# Patient Record
Sex: Female | Born: 1950 | Race: White | Hispanic: No | Marital: Married | Smoking: Never smoker
Health system: Southern US, Community
[De-identification: ages and names within clinical notes are randomized; demographics above are authoritative.]

## PROBLEM LIST (undated history)

## (undated) DIAGNOSIS — M1990 Unspecified osteoarthritis, unspecified site: Secondary | ICD-10-CM

## (undated) DIAGNOSIS — K649 Unspecified hemorrhoids: Secondary | ICD-10-CM

## (undated) DIAGNOSIS — A048 Other specified bacterial intestinal infections: Secondary | ICD-10-CM

## (undated) DIAGNOSIS — K219 Gastro-esophageal reflux disease without esophagitis: Secondary | ICD-10-CM

## (undated) DIAGNOSIS — F32A Depression, unspecified: Secondary | ICD-10-CM

## (undated) DIAGNOSIS — N289 Disorder of kidney and ureter, unspecified: Secondary | ICD-10-CM

## (undated) DIAGNOSIS — I10 Essential (primary) hypertension: Secondary | ICD-10-CM

## (undated) DIAGNOSIS — E119 Type 2 diabetes mellitus without complications: Secondary | ICD-10-CM

## (undated) DIAGNOSIS — K591 Functional diarrhea: Secondary | ICD-10-CM

## (undated) DIAGNOSIS — R7401 Elevation of levels of liver transaminase levels: Secondary | ICD-10-CM

## (undated) DIAGNOSIS — R740 Nonspecific elevation of levels of transaminase and lactic acid dehydrogenase [LDH]: Secondary | ICD-10-CM

## (undated) DIAGNOSIS — D649 Anemia, unspecified: Secondary | ICD-10-CM

## (undated) DIAGNOSIS — F419 Anxiety disorder, unspecified: Secondary | ICD-10-CM

## (undated) DIAGNOSIS — F329 Major depressive disorder, single episode, unspecified: Secondary | ICD-10-CM

## (undated) DIAGNOSIS — G4730 Sleep apnea, unspecified: Secondary | ICD-10-CM

## (undated) DIAGNOSIS — E7800 Pure hypercholesterolemia, unspecified: Secondary | ICD-10-CM

## (undated) DIAGNOSIS — Z860101 Personal history of adenomatous and serrated colon polyps: Secondary | ICD-10-CM

## (undated) HISTORY — DX: Depression, unspecified: F32.A

## (undated) HISTORY — PX: CHOLECYSTECTOMY: SHX55

## (undated) HISTORY — DX: Major depressive disorder, single episode, unspecified: F32.9

## (undated) HISTORY — PX: OTHER SURGICAL HISTORY: SHX169

---

## 1999-06-13 HISTORY — PX: DILATION AND CURETTAGE OF UTERUS: SHX78

## 2004-11-23 ENCOUNTER — Ambulatory Visit: Payer: Self-pay | Admitting: Unknown Physician Specialty

## 2005-12-14 DIAGNOSIS — E1169 Type 2 diabetes mellitus with other specified complication: Secondary | ICD-10-CM | POA: Insufficient documentation

## 2005-12-14 DIAGNOSIS — E119 Type 2 diabetes mellitus without complications: Secondary | ICD-10-CM | POA: Insufficient documentation

## 2005-12-14 DIAGNOSIS — N951 Menopausal and female climacteric states: Secondary | ICD-10-CM | POA: Insufficient documentation

## 2005-12-14 DIAGNOSIS — E782 Mixed hyperlipidemia: Secondary | ICD-10-CM | POA: Insufficient documentation

## 2005-12-14 DIAGNOSIS — N312 Flaccid neuropathic bladder, not elsewhere classified: Secondary | ICD-10-CM | POA: Insufficient documentation

## 2005-12-19 ENCOUNTER — Ambulatory Visit: Payer: Self-pay

## 2006-12-24 DIAGNOSIS — G479 Sleep disorder, unspecified: Secondary | ICD-10-CM | POA: Insufficient documentation

## 2008-06-09 DIAGNOSIS — F0630 Mood disorder due to known physiological condition, unspecified: Secondary | ICD-10-CM | POA: Insufficient documentation

## 2008-06-09 DIAGNOSIS — I8390 Asymptomatic varicose veins of unspecified lower extremity: Secondary | ICD-10-CM | POA: Insufficient documentation

## 2008-06-10 ENCOUNTER — Ambulatory Visit: Payer: Self-pay

## 2008-12-09 DIAGNOSIS — G4730 Sleep apnea, unspecified: Secondary | ICD-10-CM | POA: Insufficient documentation

## 2009-02-26 DIAGNOSIS — F411 Generalized anxiety disorder: Secondary | ICD-10-CM | POA: Insufficient documentation

## 2009-02-26 DIAGNOSIS — G4700 Insomnia, unspecified: Secondary | ICD-10-CM | POA: Insufficient documentation

## 2009-04-21 ENCOUNTER — Ambulatory Visit: Payer: Self-pay | Admitting: Unknown Physician Specialty

## 2009-04-23 ENCOUNTER — Ambulatory Visit: Payer: Self-pay | Admitting: Unknown Physician Specialty

## 2012-01-19 ENCOUNTER — Ambulatory Visit: Payer: Self-pay | Admitting: Family Medicine

## 2012-01-23 ENCOUNTER — Ambulatory Visit: Payer: Self-pay | Admitting: Family Medicine

## 2012-02-26 ENCOUNTER — Ambulatory Visit: Payer: Self-pay | Admitting: Anesthesiology

## 2012-03-01 ENCOUNTER — Emergency Department: Payer: Self-pay | Admitting: Unknown Physician Specialty

## 2012-03-01 ENCOUNTER — Ambulatory Visit: Payer: Self-pay | Admitting: General Surgery

## 2012-03-01 LAB — CBC
HCT: 35.3 % (ref 35.0–47.0)
HGB: 12 g/dL (ref 12.0–16.0)
MCH: 31.4 pg (ref 26.0–34.0)
MCHC: 34 g/dL (ref 32.0–36.0)
MCV: 92 fL (ref 80–100)
Platelet: 131 10*3/uL — ABNORMAL LOW (ref 150–440)
RBC: 3.83 10*6/uL (ref 3.80–5.20)
RDW: 14.9 % — ABNORMAL HIGH (ref 11.5–14.5)
WBC: 14.5 10*3/uL — ABNORMAL HIGH (ref 3.6–11.0)

## 2012-03-01 LAB — COMPREHENSIVE METABOLIC PANEL
Albumin: 3.2 g/dL — ABNORMAL LOW (ref 3.4–5.0)
Alkaline Phosphatase: 75 U/L (ref 50–136)
Anion Gap: 14 (ref 7–16)
BUN: 23 mg/dL — ABNORMAL HIGH (ref 7–18)
Bilirubin,Total: 0.5 mg/dL (ref 0.2–1.0)
Calcium, Total: 8.3 mg/dL — ABNORMAL LOW (ref 8.5–10.1)
Chloride: 103 mmol/L (ref 98–107)
Co2: 18 mmol/L — ABNORMAL LOW (ref 21–32)
Creatinine: 1.72 mg/dL — ABNORMAL HIGH (ref 0.60–1.30)
EGFR (African American): 36 — ABNORMAL LOW
EGFR (Non-African Amer.): 31 — ABNORMAL LOW
Glucose: 494 mg/dL — ABNORMAL HIGH (ref 65–99)
Osmolality: 296 (ref 275–301)
Potassium: 4.4 mmol/L (ref 3.5–5.1)
SGOT(AST): 47 U/L — ABNORMAL HIGH (ref 15–37)
SGPT (ALT): 56 U/L (ref 12–78)
Sodium: 135 mmol/L — ABNORMAL LOW (ref 136–145)
Total Protein: 6.7 g/dL (ref 6.4–8.2)

## 2012-03-02 LAB — MAGNESIUM: Magnesium: 1.4 mg/dL — ABNORMAL LOW

## 2012-03-02 LAB — TROPONIN I: Troponin-I: <0.02 ng/mL

## 2012-03-04 LAB — PATHOLOGY REPORT

## 2013-12-22 ENCOUNTER — Ambulatory Visit: Payer: Self-pay | Admitting: Family Medicine

## 2014-02-04 ENCOUNTER — Ambulatory Visit: Payer: Self-pay | Admitting: Obstetrics and Gynecology

## 2014-09-28 ENCOUNTER — Ambulatory Visit
Admit: 2014-09-28 | Disposition: A | Discharge status: Home / Self Care | Payer: Self-pay | Attending: Family Medicine | Admitting: Family Medicine

## 2014-09-29 NOTE — Op Note (Signed)
PATIENT NAME:  Cynthia Sims, Cynthia Sims MR#:  [id number] DATE OF BIRTH:  Aug 10, 1950  DATE OF PROCEDURE:  03/01/2012  PREOPERATIVE DIAGNOSIS: Chronic cholecystitis and cholelithiasis.   POSTOPERATIVE DIAGNOSIS: Chronic cholecystitis and cholelithiasis.   OPERATIVE PROCEDURE: Laparoscopic cholecystectomy with intraoperative cholangiograms.   SURGEON: Hervey Ard, MD  ANESTHESIA: General endotracheal.  ESTIMATED BLOOD LOSS: Minimal.   CLINICAL NOTE: This 64 year old woman has had recurrent episodes of upper abdominal pain and ultrasound showed evidence of cholelithiasis. She was felt to be a candidate for elective cholecystectomy. She received Unasyn intravenously prior to the procedure due to a past history of urinary tract infections.   OPERATIVE NOTE: With the patient under adequate general endotracheal anesthesia, the abdomen was prepped with ChloraPrep and draped. A Veress needle was placed through a transumbilical incision. After assuring intraabdominal location with the hanging drop test, the abdomen was insufflated with CO2 at 10 mmHg pressure. A 10 mm step port was expanded. Inspection showed no evidence of injury from initial port placement. An 11 mm Xcel port was placed in the epigastrium. There was noted to be a band of adhesions between the omentum and the anterior abdominal wall below the level of the liver. These were taken down with cautery dissection. Two 5-mm step ports were placed laterally. The gallbladder was placed on cephalad traction. Filmy adhesions were noted between the duodenum and the neck of the gallbladder. These were gently taken down with traction. Inspection of the area showed no evidence of injury to the duodenum. The neck of the gallbladder was cleared and the cystic duct identified. A Kumar clamp was placed. Initial cholangiogram showed only reflux into the gallbladder. The clamp was repositioned and free flow into the cystic duct, common bile duct and duodenum with  slight flush of reflux into the common and hepatic duct was noted. The cystic duct and branches of the cystic artery were divided after application of clips x2. The gallbladder was removed from the liver bed making use of hook cautery dissection. It was placed into an Endo Catch bag and then delivered through the umbilical port site. Inspection from the epigastric site showed no evidence of injury from initial port placement. The right upper quadrant was irrigated. Good hemostasis was noted. Bile spillage that had occurred during the cholangiograms was cleared to minimize postop changes. After desufflating the abdomen, the fascia at the umbilicus was approximated with 0 Vicryl figure-of-eight suture. Skin incisions were closed with 4-0 Vicryl subcuticular sutures. Benzoin, Steri-Strips, Telfa, and Tegaderm dressing was then applied.   The patient tolerated the procedure well and was taken to the recovery room in stable condition.  ____________________________ Robert Bellow, MD jwb:slb D: 03/01/2012 20:27:35 ET T: 03/02/2012 11:02:28 ET JOB#: [id number]  cc: Robert Bellow, MD, <Dictator> Khalen Styer Amedeo Kinsman MD ELECTRONICALLY SIGNED 03/04/2012 8:05

## 2014-12-21 ENCOUNTER — Other Ambulatory Visit: Payer: Self-pay | Admitting: Family Medicine

## 2015-01-13 DIAGNOSIS — N183 Chronic kidney disease, stage 3 unspecified: Secondary | ICD-10-CM | POA: Insufficient documentation

## 2015-01-14 ENCOUNTER — Ambulatory Visit (INDEPENDENT_AMBULATORY_CARE_PROVIDER_SITE_OTHER): Payer: PPO | Admitting: Family Medicine

## 2015-01-14 ENCOUNTER — Encounter: Payer: Self-pay | Admitting: Family Medicine

## 2015-01-14 VITALS — BP 110/68 | HR 67 | Temp 98.1°F | Resp 16 | Wt 190.8 lb

## 2015-01-14 DIAGNOSIS — E1122 Type 2 diabetes mellitus with diabetic chronic kidney disease: Secondary | ICD-10-CM

## 2015-01-14 DIAGNOSIS — N189 Chronic kidney disease, unspecified: Secondary | ICD-10-CM | POA: Diagnosis not present

## 2015-01-14 DIAGNOSIS — I10 Essential (primary) hypertension: Secondary | ICD-10-CM | POA: Diagnosis not present

## 2015-01-14 DIAGNOSIS — N312 Flaccid neuropathic bladder, not elsewhere classified: Secondary | ICD-10-CM

## 2015-01-14 DIAGNOSIS — F419 Anxiety disorder, unspecified: Secondary | ICD-10-CM | POA: Insufficient documentation

## 2015-01-14 LAB — POCT UA - MICROALBUMIN: Microalbumin Ur, POC: 2 mg/L

## 2015-01-14 MED ORDER — CITALOPRAM HYDROBROMIDE 20 MG PO TABS: 20.0000 mg | ORAL_TABLET | Freq: Every day | ORAL | Status: DC

## 2015-01-14 MED ORDER — LOVASTATIN 40 MG PO TABS: 40.0000 mg | ORAL_TABLET | Freq: Every day | ORAL | Status: DC

## 2015-01-14 MED ORDER — LISINOPRIL-HYDROCHLOROTHIAZIDE 10-12.5 MG PO TABS: 1.0000 | ORAL_TABLET | Freq: Every day | ORAL | Status: DC

## 2015-01-14 MED ORDER — GLIPIZIDE 10 MG PO TABS: 10.0000 mg | ORAL_TABLET | Freq: Two times a day (BID) | ORAL | Status: DC

## 2015-01-14 NOTE — Progress Notes (Signed)
[name] Female    DOB: [birth year]   64 y.o.   MRN: [id number] Visit Date: 01/14/2015  Today's Provider: [name], PA   Chief Complaint  Patient presents with  . Diabetes    follow up   Subjective:    Diabetes She presents for her follow-up diabetic visit. She has type 2 diabetes mellitus. No MedicAlert identification noted. Her disease course has been stable. Pertinent negatives for hypoglycemia include no confusion, dizziness, headaches, mood changes or nervousness/anxiousness. Associated symptoms include weakness (sometimes). Pertinent negatives for diabetes include no blurred vision, no chest pain, no fatigue, no foot ulcerations, no visual change and no weight loss. Symptoms are stable.  Reports she was forced into retirement May 2016 and switched  Insurance to [employer]. Now Januvia no longer covered by insurance and only taking Glipizide 10 mg qd.  History reviewed. No pertinent past medical history. Patient Active Problem List   Diagnosis Date Noted  . Chronic kidney disease (CKD), stage III (moderate) 01/13/2015  . Cannot sleep 02/26/2009  . Anxiety state 02/26/2009  . 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 Diabetes 12/14/2005  . 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 Menopausal symptom 12/14/2005   Past Surgical History  Procedure Laterality Date  . Cesarean section  1983  . Dilation and curettage of uterus  2001   Family History  Problem Relation Age of Onset  . Alzheimer's disease Mother   . COPD Mother   . Breast cancer Mother   . Osteoporosis Mother   . Heart attack Father   . Lupus Father   . Diabetes Father   . Hypertension Father   . Diabetes Sister   . COPD Maternal Grandmother   . Diabetes Paternal Grandfather   . Diabetes Sister   . Diabetes Sister      No Known Allergies     Previous Medications   CITALOPRAM (CELEXA) 20 MG TABLET    Take 1 tablet by mouth at bedtime.   CONJUGATED ESTROGENS (PREMARIN) VAGINAL CREAM    Place vaginally as needed.   CRANBERRY 125 MG TABS    Take 1 tablet by mouth daily.   FERROUS SULFATE 325 (65 FE) MG TABLET    Take 1 tablet by mouth daily.   GLIPIZIDE (GLUCOTROL) 10 MG TABLET    TAKE ONE TABLET BY MOUTH TWICE DAILY   LISINOPRIL-HYDROCHLOROTHIAZIDE (PRINZIDE,ZESTORETIC) 10-12.5 MG PER TABLET    Take 1 tablet by mouth daily.   LOVASTATIN (MEVACOR) 40 MG TABLET    Take 1 tablet by mouth daily.   SITAGLIPTIN (JANUVIA) 50 MG TABLET    Take 1 tablet by mouth daily.    Review of Systems  Constitutional: Negative for fever, weight loss, appetite change, fatigue and unexpected weight change.  HENT: Negative.  Negative for ear pain, rhinorrhea and sinus pressure.   Eyes: Negative.  Negative for blurred vision and visual disturbance.  Respiratory: Negative.   Cardiovascular: Negative.  Negative for chest pain, palpitations and leg swelling.  Endocrine: Negative.   Genitourinary: Negative.   Musculoskeletal: Positive for back pain (sometimes but is due to arthritis).  Skin: Negative.   Allergic/Immunologic: Negative.   Neurological: Positive for weakness (sometimes). Negative for dizziness and headaches.  Hematological: Negative.  Does not bruise/bleed easily.  Psychiatric/Behavioral: Negative.  Negative for confusion. The patient is not nervous/anxious.  History  Substance Use Topics  . Smoking status: Never Smoker   . Smokeless tobacco: Never Used  . Alcohol Use: No   Objective:   BP 110/68 mmHg  Pulse 67  Temp(Src) 98.1 F (36.7 C) (Oral)  Resp 16  Wt 190 lb 12.8 oz (86.546 kg)  Physical Exam  Constitutional: She is oriented to person, place, and time. She appears well-developed and well-nourished. No distress.  HENT:  Head: Normocephalic and atraumatic.  Right Ear: Hearing normal.  Left Ear: Hearing normal.   Nose: Nose normal.  Eyes: Conjunctivae, EOM and lids are normal. Right eye exhibits no discharge. Left eye exhibits no discharge. No scleral icterus.  Neck: Normal range of motion. Neck supple.  Cardiovascular: Normal rate, regular rhythm and normal heart sounds.   Pulmonary/Chest: Effort normal and breath sounds normal. No respiratory distress.  Abdominal: Soft. Bowel sounds are normal.  Musculoskeletal: Normal range of motion.  Neurological: She is alert and oriented to person, place, and time.  No significant areas of loss of sensation in feet to test with nylon string.  Skin: Skin is intact. No lesion and no rash noted.  Psychiatric: She has a normal mood and affect. Her speech is normal and behavior is normal. Thought content normal.      Assessment & Plan:     1. Type 2 diabetes mellitus with diabetic chronic kidney disease Only taking glipizide daily now and feels she is stable in her sugar control. [facility] eye exam was 09-15-14. Having some tingling and pains in her toes and soles of her feet bilaterally - neuropathy. Will refill medications for diabetes and hyperlipidemia. Follow diabetic diet and check FBS daily. - glipiZIDE (GLUCOTROL) 10 MG tablet; Take 1 tablet (10 mg total) by mouth 2 (two) times daily.  Dispense: 60 tablet; Refill: 6 - lovastatin (MEVACOR) 40 MG tablet; Take 1 tablet (40 mg total) by mouth daily.  Dispense: 30 tablet; Refill: 6 - Hemoglobin A1c - Comprehensive metabolic panel - CBC with Differential/Platelet - Lipid panel - POCT UA - Microalbumin  2. 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 Considering a bladder stimulator if no further improvement. Still using self-catheterization 3 times a day.   3. Anxiety Feels anxiety and sleep disorder controlled fairly well with Citalopram and wants to continue the same dosage. - citalopram (CELEXA) 20 MG tablet; Take 1 tablet (20 mg total) by mouth at bedtime.   Dispense: 30 tablet; Refill: 6  4. Essential hypertension Very good BP control and tolerating Prinzide without side effects. Refill medication and check CBC with CMP. Follow up pending reports. - lisinopril-hydrochlorothiazide (PRINZIDE,ZESTORETIC) 10-12.5 MG per tablet; Take 1 tablet by mouth daily.  Dispense: 30 tablet; Refill: [place], PA  [facility]

## 2015-01-15 ENCOUNTER — Telehealth: Payer: Self-pay

## 2015-01-15 LAB — COMPREHENSIVE METABOLIC PANEL
ALT: 16 IU/L (ref 0–32)
AST: 15 IU/L (ref 0–40)
Albumin/Globulin Ratio: 1.5 (ref 1.1–2.5)
Albumin: 4.3 g/dL (ref 3.6–4.8)
Alkaline Phosphatase: 65 [IU]/L (ref 39–117)
BUN/Creatinine Ratio: 20 (ref 11–26)
BUN: 31 mg/dL — ABNORMAL HIGH (ref 8–27)
Bilirubin Total: 0.3 mg/dL (ref 0.0–1.2)
CO2: 16 mmol/L — ABNORMAL LOW (ref 18–29)
Calcium: 9.1 mg/dL (ref 8.7–10.3)
Chloride: 101 mmol/L (ref 97–108)
Creatinine, Ser: 1.56 mg/dL — ABNORMAL HIGH (ref 0.57–1.00)
GFR calc Af Amer: 40 mL/min/{1.73_m2} — ABNORMAL LOW (ref >59)
GFR calc non Af Amer: 34 mL/min/{1.73_m2} — ABNORMAL LOW (ref >59)
Globulin, Total: 2.9 g/dL (ref 1.5–4.5)
Glucose: 94 mg/dL (ref 65–99)
Potassium: 5.8 mmol/L — ABNORMAL HIGH (ref 3.5–5.2)
Sodium: 135 mmol/L (ref 134–144)
Total Protein: 7.2 g/dL (ref 6.0–8.5)

## 2015-01-15 LAB — LIPID PANEL
Chol/HDL Ratio: 3.7 ratio units (ref 0.0–4.4)
Cholesterol, Total: 170 mg/dL (ref 100–199)
HDL: 46 mg/dL (ref >39)
LDL Calculated: 84 mg/dL (ref 0–99)
Triglycerides: 199 mg/dL — ABNORMAL HIGH (ref 0–149)
VLDL Cholesterol Cal: 40 mg/dL (ref 5–40)

## 2015-01-15 LAB — HEMOGLOBIN A1C
Est. average glucose Bld gHb Est-mCnc: 160 mg/dL
Hgb A1c MFr Bld: 7.2 % — ABNORMAL HIGH (ref 4.8–5.6)

## 2015-01-15 LAB — CBC WITH DIFFERENTIAL/PLATELET
Basophils Absolute: 0 10*3/uL (ref 0.0–0.2)
Basos: 0 %
EOS (ABSOLUTE): 0 10*3/uL (ref 0.0–0.4)
Eos: 1 %
Hematocrit: 36.9 % (ref 34.0–46.6)
Hemoglobin: 12.7 g/dL (ref 11.1–15.9)
Immature Grans (Abs): 0 10*3/uL (ref 0.0–0.1)
Immature Granulocytes: 0 %
Lymphocytes Absolute: 2.7 10*3/uL (ref 0.7–3.1)
Lymphs: 39 %
MCH: 31.1 pg (ref 26.6–33.0)
MCHC: 34.4 g/dL (ref 31.5–35.7)
MCV: 90 fL (ref 79–97)
Monocytes Absolute: 0.5 10*3/uL (ref 0.1–0.9)
Monocytes: 7 %
Neutrophils Absolute: 3.8 10*3/uL (ref 1.4–7.0)
Neutrophils: 53 %
Platelets: 165 10*3/uL (ref 150–379)
RBC: 4.09 x10E6/uL (ref 3.77–5.28)
RDW: 13.7 % (ref 12.3–15.4)
WBC: 7 10*3/uL (ref 3.4–10.8)

## 2015-01-15 NOTE — Telephone Encounter (Signed)
Advised pt regarding the note below, pt stated fully understanding and stated that she will check with her insurance and call [facility] back regarding the Onglyza medication. Awaiting for her call.

## 2015-01-15 NOTE — Telephone Encounter (Signed)
-----   Message from Margo Common, [place] sent at 01/15/2015  5:46 PM EDT ----- Kidney function a little better than 2 years ago but still shows decrease in function with creatinine and BUN high. Blood cell counts are all normal as well as glucose. Triglycerides are high but all cholesterol levels are normal. Continue Glyburide and if can't afford Januvia, check with insurance about Onglyza as a possibility.

## 2015-04-12 ENCOUNTER — Encounter: Payer: Self-pay | Admitting: Family Medicine

## 2015-04-12 ENCOUNTER — Ambulatory Visit (INDEPENDENT_AMBULATORY_CARE_PROVIDER_SITE_OTHER): Payer: PPO | Admitting: Family Medicine

## 2015-04-12 ENCOUNTER — Ambulatory Visit
Admission: RE | Admit: 2015-04-12 | Discharge: 2015-04-12 | Disposition: A | Discharge status: Home / Self Care | Payer: PPO | Source: Ambulatory Visit | Attending: Family Medicine | Admitting: Family Medicine

## 2015-04-12 VITALS — BP 118/58 | HR 68 | Temp 98.2°F | Resp 16 | Wt 192.2 lb

## 2015-04-12 DIAGNOSIS — N183 Chronic kidney disease, stage 3 unspecified: Secondary | ICD-10-CM

## 2015-04-12 DIAGNOSIS — M79661 Pain in right lower leg: Secondary | ICD-10-CM

## 2015-04-12 DIAGNOSIS — E0800 Diabetes mellitus due to underlying condition with hyperosmolarity without nonketotic hyperglycemic-hyperosmolar coma (NKHHC): Secondary | ICD-10-CM

## 2015-04-12 DIAGNOSIS — M7989 Other specified soft tissue disorders: Secondary | ICD-10-CM | POA: Diagnosis not present

## 2015-04-12 LAB — POCT GLYCOSYLATED HEMOGLOBIN (HGB A1C): Hemoglobin A1C: 7.2

## 2015-04-12 NOTE — Patient Instructions (Signed)

## 2015-04-12 NOTE — Progress Notes (Signed)
Patient ID: Cynthia Sims, female   DOB: 1950/07/01, 64 y.o.   MRN: [id number] Name: Cynthia Sims   MRN: [id number]    DOB: [birth year]   Date:04/12/2015       Progress Note  Subjective  Chief Complaint  Chief Complaint  Patient presents with  . Leg Swelling    right leg X 1 week    Leg Pain  The incident occurred more than 1 week ago. There was no injury mechanism. The pain is present in the right leg.  Having soreness with intermittent sharp pains and numbness in the right leg. Also, ache and pressure pain in lower back. Worse with lifting. Feels better when off her feet but still some soreness. Leg Swelling:  Patient reports right leg swelling X 1 weeks. Swelling is worse in the afternoon.    Past Surgical History  Procedure Laterality Date  . Cesarean section  1983  . Dilation and curettage of uterus  2001   Patient Active Problem List   Diagnosis Date Noted  . Anxiety 01/14/2015  . Hypertension 01/14/2015  . Chronic kidney disease (CKD), stage III (moderate) 01/13/2015  . Cannot sleep 02/26/2009  . Anxiety state 02/26/2009  . 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 Diabetes ([place]) 12/14/2005  . 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 Menopausal symptom 12/14/2005    Social History  Substance Use Topics  . Smoking status: Never Smoker   . Smokeless tobacco: Never Used  . Alcohol Use: No    Current outpatient prescriptions:  .  citalopram (CELEXA) 20 MG tablet, Take 1 tablet (20 mg total) by mouth at bedtime., Disp: 30 tablet, Rfl: 6 .  conjugated estrogens (PREMARIN) vaginal cream, Place vaginally as needed., Disp: , Rfl:  .  Cranberry 125 MG TABS, Take 1 tablet by mouth daily., Disp: , Rfl:  .  ferrous sulfate 325 (65 FE) MG tablet, Take 1 tablet by mouth daily., Disp: , Rfl:  .  glipiZIDE (GLUCOTROL) 10 MG tablet, Take 1 tablet  (10 mg total) by mouth 2 (two) times daily., Disp: 60 tablet, Rfl: 6 .  lisinopril-hydrochlorothiazide (PRINZIDE,ZESTORETIC) 10-12.5 MG per tablet, Take 1 tablet by mouth daily., Disp: 30 tablet, Rfl: 6 .  lovastatin (MEVACOR) 40 MG tablet, Take 1 tablet (40 mg total) by mouth daily., Disp: 30 tablet, Rfl: 6 .  sitaGLIPtin (JANUVIA) 50 MG tablet, Take 1 tablet by mouth daily., Disp: , Rfl:   No Known Allergies  Review of Systems  Constitutional: Negative.   HENT: Negative.   Eyes: Negative.   Respiratory: Negative.   Cardiovascular: Positive for leg swelling.  Gastrointestinal: Negative.   Genitourinary: Negative.   Musculoskeletal: Negative.   Skin: Negative.   Neurological: Negative.   Endo/Heme/Allergies: Negative.   Psychiatric/Behavioral: Negative.    Objective  Filed Vitals:   04/12/15 0901  BP: 118/58  Pulse: 68  Temp: 98.2 F (36.8 C)  TempSrc: Oral  Resp: 16  Weight: 192 lb 3.2 oz (87.181 kg)   Physical Exam  Constitutional: She is oriented to person, place, and time and well-developed, well-nourished, and in no distress.  HENT:  Head: Normocephalic.  Cardiovascular: Normal rate and regular rhythm.   Pulmonary/Chest: Effort normal and breath sounds normal.  Abdominal: Bowel sounds are normal.  Musculoskeletal:  Low back soreness with fair ROM. Soreness in right calf with faint pinkness over the lower  shin slight decrease in sensation of the foot and lower leg to test with nylon string. No pitting edema at the present. Positive Homan's sign. Calves measure 14.75 inches on the left and 15 inches on the right. Pulses 2+ and symmetric in feet.  Neurological: She is alert and oriented to person, place, and time.    Recent Results (from the past 2160 hour(s))  POCT UA - Microalbumin     Status: None   Collection Time: 01/14/15  9:48 AM  Result Value Ref Range   Microalbumin Ur, POC 2 mg/L   Creatinine, POC NA mg/dL   Albumin/Creatinine Ratio, Urine, POC NA    Hemoglobin A1c     Status: Abnormal   Collection Time: 01/14/15  9:59 AM  Result Value Ref Range   Hgb A1c MFr Bld 7.2 (H) 4.8 - 5.6 %    Comment:          Pre-diabetes: 5.7 - 6.4          Diabetes: >6.4          Glycemic control for adults with diabetes: <7.0    Est. average glucose Bld gHb Est-mCnc 160 mg/dL  Comprehensive metabolic panel     Status: Abnormal   Collection Time: 01/14/15  9:59 AM  Result Value Ref Range   Glucose 94 65 - 99 mg/dL   BUN 31 (H) 8 - 27 mg/dL   Creatinine, Ser 1.56 (H) 0.57 - 1.00 mg/dL   GFR calc non Af Amer 34 (L) >59 mL/min/1.73   GFR calc Af Amer 40 (L) >59 mL/min/1.73   BUN/Creatinine Ratio 20 11 - 26   Sodium 135 134 - 144 mmol/L   Potassium 5.8 (H) 3.5 - 5.2 mmol/L   Chloride 101 97 - 108 mmol/L   CO2 16 (L) 18 - 29 mmol/L   Calcium 9.1 8.7 - 10.3 mg/dL   Total Protein 7.2 6.0 - 8.5 g/dL   Albumin 4.3 3.6 - 4.8 g/dL   Globulin, Total 2.9 1.5 - 4.5 g/dL   Albumin/Globulin Ratio 1.5 1.1 - 2.5   Bilirubin Total 0.3 0.0 - 1.2 mg/dL   Alkaline Phosphatase 65 39 - 117 IU/L   AST 15 0 - 40 IU/L   ALT 16 0 - 32 IU/L  CBC with Differential/Platelet     Status: None   Collection Time: 01/14/15  9:59 AM  Result Value Ref Range   WBC 7.0 3.4 - 10.8 x10E3/uL   RBC 4.09 3.77 - 5.28 x10E6/uL   Hemoglobin 12.7 11.1 - 15.9 g/dL   Hematocrit 36.9 34.0 - 46.6 %   MCV 90 79 - 97 fL   MCH 31.1 26.6 - 33.0 pg   MCHC 34.4 31.5 - 35.7 g/dL   RDW 13.7 12.3 - 15.4 %   Platelets 165 150 - 379 x10E3/uL   Neutrophils 53 %   Lymphs 39 %   Monocytes 7 %   Eos 1 %   Basos 0 %   Neutrophils Absolute 3.8 1.4 - 7.0 x10E3/uL   Lymphocytes Absolute 2.7 0.7 - 3.1 x10E3/uL   Monocytes Absolute 0.5 0.1 - 0.9 x10E3/uL   EOS (ABSOLUTE) 0.0 0.0 - 0.4 x10E3/uL   Basophils Absolute 0.0 0.0 - 0.2 x10E3/uL   Immature Granulocytes 0 %   Immature Grans (Abs) 0.0 0.0 - 0.1 x10E3/uL  Lipid panel     Status: Abnormal   Collection Time: 01/14/15  9:59 AM  Result Value Ref  Range   Cholesterol, Total 170 100 -  199 mg/dL   Triglycerides 199 (H) 0 - 149 mg/dL   HDL 46 >39 mg/dL    Comment: According to ATP-III Guidelines, HDL-C >59 mg/dL is considered a negative risk factor for CHD.    VLDL Cholesterol Cal 40 5 - 40 mg/dL   LDL Calculated 84 0 - 99 mg/dL   Chol/HDL Ratio 3.7 0.0 - 4.4 ratio units    Comment:                                   T. Chol/HDL Ratio                                             Men  Women                               1/2 Avg.Risk  3.4    3.3                                   Avg.Risk  5.0    4.4                                2X Avg.Risk  9.6    7.1                                3X Avg.Risk 23.4   11.0    Assessment & Plan  1. Right calf pain Onset over the past week without known injury. Will get D-dimer and vascular imaging to rule out DVT. - CBC with Differential/Platelet - US Venous Img Lower Unilateral Right - D-Dimer, Quantitative  2. Diabetes mellitus due to underlying condition with hyperosmolarity without coma, without long-term current use of insulin (HCC) FBS 145 but will go up to over 200 after breakfast. Cannot afford the Januvia but still taking the Glipizide. Will get follow up labs and consider use of Metformin. Continue diabetic diet. Recheck pending lab reports. - POCT HgB A1C  3. Chronic kidney disease (CKD), stage III (moderate)  - COMPLETE METABOLIC PANEL WITH GFR

## 2015-04-13 LAB — COMPREHENSIVE METABOLIC PANEL
ALT: 15 IU/L (ref 0–32)
AST: 18 IU/L (ref 0–40)
Albumin/Globulin Ratio: 1.5 (ref 1.1–2.5)
Albumin: 4 g/dL (ref 3.6–4.8)
Alkaline Phosphatase: 74 IU/L (ref 39–117)
BUN/Creatinine Ratio: 21 (ref 11–26)
BUN: 33 mg/dL — ABNORMAL HIGH (ref 8–27)
Bilirubin Total: 0.2 mg/dL (ref 0.0–1.2)
CO2: 16 mmol/L — ABNORMAL LOW (ref 18–29)
Calcium: 8.7 mg/dL (ref 8.7–10.3)
Chloride: 102 mmol/L (ref 97–106)
Creatinine, Ser: 1.6 mg/dL — ABNORMAL HIGH (ref 0.57–1.00)
GFR calc Af Amer: 38 mL/min/{1.73_m2} — ABNORMAL LOW (ref >59)
GFR calc non Af Amer: 33 mL/min/{1.73_m2} — ABNORMAL LOW (ref >59)
Globulin, Total: 2.7 g/dL (ref 1.5–4.5)
Glucose: 240 mg/dL — ABNORMAL HIGH (ref 65–99)
Potassium: 5.2 mmol/L (ref 3.5–5.2)
Sodium: 137 mmol/L (ref 136–144)
Total Protein: 6.7 g/dL (ref 6.0–8.5)

## 2015-04-13 LAB — CBC WITH DIFFERENTIAL/PLATELET
Basophils Absolute: 0 10*3/uL (ref 0.0–0.2)
Basos: 0 %
EOS (ABSOLUTE): 0.1 10*3/uL (ref 0.0–0.4)
Eos: 1 %
Hematocrit: 34.3 % (ref 34.0–46.6)
Hemoglobin: 11.5 g/dL (ref 11.1–15.9)
Immature Grans (Abs): 0 10*3/uL (ref 0.0–0.1)
Immature Granulocytes: 0 %
Lymphocytes Absolute: 2 10*3/uL (ref 0.7–3.1)
Lymphs: 29 %
MCH: 31.2 pg (ref 26.6–33.0)
MCHC: 33.5 g/dL (ref 31.5–35.7)
MCV: 93 fL (ref 79–97)
Monocytes Absolute: 0.6 10*3/uL (ref 0.1–0.9)
Monocytes: 8 %
Neutrophils Absolute: 4.1 10*3/uL (ref 1.4–7.0)
Neutrophils: 62 %
Platelets: 140 10*3/uL — ABNORMAL LOW (ref 150–379)
RBC: 3.69 x10E6/uL — ABNORMAL LOW (ref 3.77–5.28)
RDW: 12.9 % (ref 12.3–15.4)
WBC: 6.7 10*3/uL (ref 3.4–10.8)

## 2015-04-13 LAB — D-DIMER, QUANTITATIVE: D-DIMER: 0.76 mg/L FEU — ABNORMAL HIGH (ref 0.00–0.49)

## 2015-04-15 ENCOUNTER — Telehealth: Payer: Self-pay

## 2015-04-15 DIAGNOSIS — E119 Type 2 diabetes mellitus without complications: Secondary | ICD-10-CM

## 2015-04-15 MED ORDER — METFORMIN HCL 500 MG PO TABS
500.0000 mg | ORAL_TABLET | Freq: Every day | ORAL | Status: DC
Start: 2015-04-15 — End: 2015-08-02

## 2015-04-15 NOTE — Telephone Encounter (Signed)
-----   Message from Margo Common, [place] sent at 04/15/2015  8:40 AM EDT ----- Doppler ultrasound was negative for any clots in leg. Blood sugar high at 240. Recommend one baby ASA daily and add Metformin 500 mg qd #30 & 3 RF. Recheck in 3 months for recheck of Hgb A1C. Check FBS daily.

## 2015-04-15 NOTE — Telephone Encounter (Signed)
Patient advised as directed below. Patient verbalized understanding and agrees with treatment plan. RX sent to pharmacy.

## 2015-05-17 ENCOUNTER — Ambulatory Visit (INDEPENDENT_AMBULATORY_CARE_PROVIDER_SITE_OTHER): Payer: PPO | Admitting: Family Medicine

## 2015-05-17 ENCOUNTER — Encounter: Payer: Self-pay | Admitting: Family Medicine

## 2015-05-17 VITALS — BP 116/68 | HR 61 | Temp 97.7°F | Resp 16 | Wt 192.8 lb

## 2015-05-17 DIAGNOSIS — N183 Chronic kidney disease, stage 3 unspecified: Secondary | ICD-10-CM

## 2015-05-17 DIAGNOSIS — E782 Mixed hyperlipidemia: Secondary | ICD-10-CM

## 2015-05-17 DIAGNOSIS — F419 Anxiety disorder, unspecified: Secondary | ICD-10-CM | POA: Diagnosis not present

## 2015-05-17 DIAGNOSIS — I10 Essential (primary) hypertension: Secondary | ICD-10-CM

## 2015-05-17 DIAGNOSIS — E1100 Type 2 diabetes mellitus with hyperosmolarity without nonketotic hyperglycemic-hyperosmolar coma (NKHHC): Secondary | ICD-10-CM | POA: Diagnosis not present

## 2015-05-17 NOTE — Progress Notes (Signed)
Patient ID: Cynthia Sims, female   DOB: Aug 30, 1950, 64 y.o.   MRN: [id number]   Chief Complaint  Patient presents with  . Diabetes  . Hypertension  . Anxiety  . Follow-up    Subjective:  Diabetes She presents for her follow-up diabetic visit. She has type 2 diabetes mellitus. Her disease course has been stable. Hypoglycemia symptoms include nervousness/anxiousness. Pertinent negatives for hypoglycemia include no headaches. (Occasional tingling and soreness in right calf with a burning sensation - a little everyday but usually clear before the next day.) Symptoms are stable. Risk factors for coronary artery disease include hypertension and dyslipidemia. Current diabetic treatment includes oral agent (dual therapy). She is compliant with treatment most of the time. Her home blood glucose trend is fluctuating minimally (FBS 114 this morning).  Hypertension This is a chronic problem. The current episode started more than 1 year ago. The problem is unchanged. The problem is controlled. Associated symptoms include anxiety. Pertinent negatives include no headaches, palpitations or shortness of breath. Risk factors for coronary artery disease include dyslipidemia and diabetes mellitus. Past treatments include ACE inhibitors and diuretics. There are no compliance problems.   Anxiety Presents for follow-up visit. Symptoms include nervous/anxious behavior. Patient reports no palpitations or shortness of breath. Primary symptoms comment: occurs when husband has "heart issues" or conflict with son.     Past Surgical History  Procedure Laterality Date  . Cesarean section  1983  . Dilation and curettage of uterus  2001    Prior to Admission medications   Medication Sig Start Date End Date Taking? Authorizing Provider  citalopram (CELEXA) 20 MG tablet Take 1 tablet (20 mg total) by mouth at bedtime. 01/14/15  Yes Dennis E Chrismon, PA  conjugated estrogens (PREMARIN) vaginal cream Place vaginally as  needed.   Yes Historical Provider, MD  Cranberry 125 MG TABS Take 1 tablet by mouth daily.   Yes Historical Provider, MD  ferrous sulfate 325 (65 FE) MG tablet Take 1 tablet by mouth daily.   Yes Historical Provider, MD  glipiZIDE (GLUCOTROL) 10 MG tablet Take 1 tablet (10 mg total) by mouth 2 (two) times daily. 01/14/15  Yes Dennis E Chrismon, PA  lisinopril-hydrochlorothiazide (PRINZIDE,ZESTORETIC) 10-12.5 MG per tablet Take 1 tablet by mouth daily. 01/14/15  Yes Dennis E Chrismon, PA  lovastatin (MEVACOR) 40 MG tablet Take 1 tablet (40 mg total) by mouth daily. 01/14/15  Yes Vickki Muff Chrismon, PA  metFORMIN (GLUCOPHAGE) 500 MG tablet Take 1 tablet (500 mg total) by mouth daily with breakfast. 04/15/15  Yes Margo Common, PA    Patient Active Problem List   Diagnosis Date Noted  . Anxiety 01/14/2015  . Hypertension 01/14/2015  . Chronic kidney disease (CKD), stage III (moderate) 01/13/2015  . Cannot sleep 02/26/2009  . Anxiety state 02/26/2009  . 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 Diabetes ([place]) 12/14/2005  . 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 Menopausal symptom 12/14/2005    Social History   Social History  . Marital Status: Married    Spouse Name: N/A  . Number of Children: N/A  . Years of Education: N/A   Occupational History  . Not on file.   Social History Main Topics  . Smoking status: Never Smoker   . Smokeless tobacco: Never Used  . Alcohol Use: No  . Drug Use: No  . Sexual Activity:  Not on file   Other Topics Concern  . Not on file   Social History Narrative   Family History  Problem Relation Age of Onset  . Alzheimer's disease Mother   . COPD Mother   . Breast cancer Mother   . Osteoporosis Mother   . Heart attack Father   . Lupus Father   . Diabetes Father   . Hypertension Father   . Diabetes Sister   . COPD  Maternal Grandmother   . Diabetes Paternal Grandfather   . Diabetes Sister   . Diabetes Sister     No Known Allergies  Review of Systems  Constitutional: Negative.   HENT: Negative.   Eyes: Negative.   Respiratory: Negative.  Negative for shortness of breath.   Cardiovascular: Negative.  Negative for palpitations.  Gastrointestinal: Negative.   Genitourinary: Negative.   Musculoskeletal: Negative.   Skin: Negative.   Neurological: Negative.  Negative for headaches.  Endo/Heme/Allergies: Negative.   Psychiatric/Behavioral: The patient is nervous/anxious.     Immunization History  Administered Date(s) Administered  . Pneumococcal Conjugate-13 09/25/2014  . Tdap 09/25/2014   Objective:  BP 116/68 mmHg  Pulse 61  Temp(Src) 97.7 F (36.5 C) (Oral)  Resp 16  Wt 192 lb 12.8 oz (87.454 kg)  SpO2 96%  Physical Exam  Constitutional: She is oriented to person, place, and time and well-developed, well-nourished, and in no distress.  HENT:  Head: Normocephalic.  Right Ear: External ear normal.  Left Ear: External ear normal.  Nose: Nose normal.  Mouth/Throat: Oropharynx is clear and moist.  Eyes: Conjunctivae and EOM are normal.  Neck: Normal range of motion. Neck supple.  Cardiovascular: Normal rate, regular rhythm and normal heart sounds.   Pulmonary/Chest: Effort normal and breath sounds normal.  Abdominal: Soft. Bowel sounds are normal.  Musculoskeletal: Normal range of motion.  Neurological: She is alert and oriented to person, place, and time.  Skin: No rash noted.  Psychiatric: Affect and judgment normal.    Lab Results  Component Value Date   WBC 6.7 04/12/2015   HGB 12.0 03/01/2012   HCT 34.3 04/12/2015   PLT 131* 03/01/2012   GLUCOSE 240* 04/12/2015   CHOL 170 01/14/2015   TRIG 199* 01/14/2015   HDL 46 01/14/2015   LDLCALC 84 01/14/2015   HGBA1C 7.2 04/12/2015    CMP     Component Value Date/Time   NA 137 04/12/2015 1023   NA 135* 03/01/2012 2236    K 5.2 04/12/2015 1023   K 4.4 03/01/2012 2236   CL 102 04/12/2015 1023   CL 103 03/01/2012 2236   CO2 16* 04/12/2015 1023   CO2 18* 03/01/2012 2236   GLUCOSE 240* 04/12/2015 1023   GLUCOSE 494* 03/01/2012 2236   BUN 33* 04/12/2015 1023   BUN 23* 03/01/2012 2236   CREATININE 1.60* 04/12/2015 1023   CREATININE 1.72* 03/01/2012 2236   CALCIUM 8.7 04/12/2015 1023   CALCIUM 8.3* 03/01/2012 2236   PROT 6.7 04/12/2015 1023   PROT 6.7 03/01/2012 2236   ALBUMIN 4.0 04/12/2015 1023   ALBUMIN 3.2* 03/01/2012 2236   AST 18 04/12/2015 1023   AST 47* 03/01/2012 2236   ALT 15 04/12/2015 1023   ALT 56 03/01/2012 2236   ALKPHOS 74 04/12/2015 1023   ALKPHOS 75 03/01/2012 2236   BILITOT 0.2 04/12/2015 1023   BILITOT 0.5 03/01/2012 2236   GFRNONAA 33* 04/12/2015 1023   GFRNONAA 31* 03/01/2012 2236   GFRAA 38* 04/12/2015 1023   GFRAA 36*  03/01/2012 2236    Assessment and Plan :  1. Type 2 diabetes mellitus with hyperosmolarity without coma, without long-term current use of insulin (HCC) Tolerating Glipizide and Metformin with fluctuating FBS readings. Was 114 this morning but occasionally 170's. Will continue diabetic diet and recheck chemistry. Last Hgb A1C 04-12-15 was 7.2. Had eye exam by ophthalmologist in April 2016. Intermittent burning discomfort in the right calf - questionable neuropathy developing versus varicose vein issues. Recheck in 3 months.  2. Essential hypertension Stable and well controlled on the Lisinopril.-HCTZ without side effects.   3. Anxiety Associated with family issues (husband's health and conflict with son). Recommend counseling with minister. Doesn't need medications at this time.  4. Chronic kidney disease (CKD), stage III (moderate) No polydipsia or polyuria. Will recheck labs to assess progress. - CBC with Differential/Platelet - COMPLETE METABOLIC PANEL WITH GFR  5. Combined fat and carbohydrate induced hyperlipemia Tolerating Lovastatin without side  effect issues noted. Will continue low fat diet and effort to lower weight. Recheck labs and follow up pending reports. - Lipid panel  [facility] 05/17/2015 8:46 AM

## 2015-05-18 ENCOUNTER — Telehealth: Payer: Self-pay

## 2015-05-18 LAB — CBC WITH DIFFERENTIAL/PLATELET
Basophils Absolute: 0 x10E3/uL (ref 0.0–0.2)
Basos: 0 %
EOS (ABSOLUTE): 0.1 x10E3/uL (ref 0.0–0.4)
Eos: 2 %
Hematocrit: 35.4 % (ref 34.0–46.6)
Hemoglobin: 12.1 g/dL (ref 11.1–15.9)
Immature Grans (Abs): 0 x10E3/uL (ref 0.0–0.1)
Immature Granulocytes: 0 %
Lymphocytes Absolute: 1.8 x10E3/uL (ref 0.7–3.1)
Lymphs: 31 %
MCH: 31.4 pg (ref 26.6–33.0)
MCHC: 34.2 g/dL (ref 31.5–35.7)
MCV: 92 fL (ref 79–97)
Monocytes Absolute: 0.6 x10E3/uL (ref 0.1–0.9)
Monocytes: 10 %
Neutrophils Absolute: 3.5 x10E3/uL (ref 1.4–7.0)
Neutrophils: 57 %
Platelets: 148 x10E3/uL — ABNORMAL LOW (ref 150–379)
RBC: 3.85 x10E6/uL (ref 3.77–5.28)
RDW: 13.5 % (ref 12.3–15.4)
WBC: 6 x10E3/uL (ref 3.4–10.8)

## 2015-05-18 LAB — LIPID PANEL
Chol/HDL Ratio: 3.5 ratio (ref 0.0–4.4)
Cholesterol, Total: 150 mg/dL (ref 100–199)
HDL: 43 mg/dL
LDL Calculated: 77 mg/dL (ref 0–99)
Triglycerides: 149 mg/dL (ref 0–149)
VLDL Cholesterol Cal: 30 mg/dL (ref 5–40)

## 2015-05-18 LAB — COMPREHENSIVE METABOLIC PANEL
ALT: 16 [IU]/L (ref 0–32)
AST: 17 IU/L (ref 0–40)
Albumin/Globulin Ratio: 1.5 (ref 1.1–2.5)
Albumin: 4 g/dL (ref 3.6–4.8)
Alkaline Phosphatase: 80 IU/L (ref 39–117)
BUN/Creatinine Ratio: 20 (ref 11–26)
BUN: 31 mg/dL — ABNORMAL HIGH (ref 8–27)
Bilirubin Total: <0.2 mg/dL (ref 0.0–1.2)
CO2: 16 mmol/L — ABNORMAL LOW (ref 18–29)
Calcium: 9 mg/dL (ref 8.7–10.3)
Chloride: 102 mmol/L (ref 97–106)
Creatinine, Ser: 1.56 mg/dL — ABNORMAL HIGH (ref 0.57–1.00)
GFR calc Af Amer: 40 mL/min/{1.73_m2} — ABNORMAL LOW (ref >59)
GFR calc non Af Amer: 34 mL/min/{1.73_m2} — ABNORMAL LOW (ref >59)
Globulin, Total: 2.6 g/dL (ref 1.5–4.5)
Glucose: 115 mg/dL — ABNORMAL HIGH (ref 65–99)
Potassium: 5.3 mmol/L — ABNORMAL HIGH (ref 3.5–5.2)
Sodium: 138 mmol/L (ref 136–144)
Total Protein: 6.6 g/dL (ref 6.0–8.5)

## 2015-05-18 NOTE — Telephone Encounter (Signed)
-----   Message from Margo Common, [place] sent at 05/18/2015 12:30 PM EST ----- Very good cholesterol levels. Glucose and kidney function tests are improved. Continue present medications and recheck in 3 months.

## 2015-05-18 NOTE — Telephone Encounter (Signed)
Patient advised as directed below. Patent verbalized understanding and agrees with plan of care.

## 2015-06-24 ENCOUNTER — Ambulatory Visit (INDEPENDENT_AMBULATORY_CARE_PROVIDER_SITE_OTHER): Payer: PPO | Admitting: Physician Assistant

## 2015-06-24 ENCOUNTER — Encounter: Payer: Self-pay | Admitting: Physician Assistant

## 2015-06-24 VITALS — BP 102/60 | HR 84 | Temp 98.2°F | Resp 16 | Wt 192.6 lb

## 2015-06-24 DIAGNOSIS — R300 Dysuria: Secondary | ICD-10-CM

## 2015-06-24 DIAGNOSIS — R05 Cough: Secondary | ICD-10-CM | POA: Diagnosis not present

## 2015-06-24 DIAGNOSIS — N3000 Acute cystitis without hematuria: Secondary | ICD-10-CM

## 2015-06-24 DIAGNOSIS — R059 Cough, unspecified: Secondary | ICD-10-CM

## 2015-06-24 DIAGNOSIS — N312 Flaccid neuropathic bladder, not elsewhere classified: Secondary | ICD-10-CM

## 2015-06-24 LAB — POCT URINALYSIS DIPSTICK
Bilirubin, UA: NEGATIVE
Glucose, UA: 2000
Ketones, UA: NEGATIVE
Nitrite, UA: POSITIVE
Protein, UA: 300
Spec Grav, UA: 1.025
Urobilinogen, UA: 0.2
pH, UA: 6

## 2015-06-24 MED ORDER — PHENAZOPYRIDINE HCL 200 MG PO TABS: 200.0000 mg | ORAL_TABLET | Freq: Three times a day (TID) | ORAL | Status: DC | PRN

## 2015-06-24 MED ORDER — SULFAMETHOXAZOLE-TRIMETHOPRIM 800-160 MG PO TABS: 1.0000 | ORAL_TABLET | Freq: Two times a day (BID) | ORAL | Status: DC

## 2015-06-24 NOTE — Patient Instructions (Signed)

## 2015-06-24 NOTE — Progress Notes (Signed)
[name] Female    DOB: 1951/05/04   65 y.o.   MRN: [id number] Visit Date: 06/24/2015  Today's Provider: Mar Daring, PA-C   Chief Complaint  Patient presents with  . Urinary Tract Infection   Subjective:    Urinary Tract Infection  This is a new problem. The current episode started in the past 7 days. The problem occurs every urination. The quality of the pain is described as burning, aching and shooting. 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 There has been no fever. Associated symptoms include chills, frequency, hematuria, nausea and urgency. Pertinent negatives include no discharge, flank pain or vomiting. Associated symptoms comments: Bad odor. Treatments tried: Ibuprofen. The treatment provided no relief. Her past medical history is significant for catheterization.  Cough This is a new problem. The current episode started yesterday. The problem has been gradually worsening. The problem occurs constantly. The cough is non-productive. Associated symptoms include chills, nasal congestion and rhinorrhea. Pertinent negatives include no chest pain, ear pain, fever, headaches, postnasal drip, sore throat, shortness of breath or wheezing. Nothing aggravates the symptoms. She has tried nothing for the symptoms.      No Known Allergies Previous Medications   CITALOPRAM (CELEXA) 20 MG TABLET    Take 1 tablet (20 mg total) by mouth at bedtime.   CONJUGATED ESTROGENS (PREMARIN) VAGINAL CREAM    Place vaginally as needed.   CRANBERRY 125 MG TABS    Take 1 tablet by mouth daily.   FERROUS SULFATE 325 (65 FE) MG TABLET    Take 1 tablet by mouth daily.   GLIPIZIDE (GLUCOTROL) 10 MG TABLET    Take 1 tablet (10 mg total) by mouth 2 (two) times daily.   LISINOPRIL-HYDROCHLOROTHIAZIDE (PRINZIDE,ZESTORETIC) 10-12.5 MG PER TABLET    Take 1 tablet by mouth daily.   LOVASTATIN (MEVACOR) 40 MG TABLET    Take 1 tablet (40 mg  total) by mouth daily.   METFORMIN (GLUCOPHAGE) 500 MG TABLET    Take 1 tablet (500 mg total) by mouth daily with breakfast.    Review of Systems  Constitutional: Positive for chills. Negative for fever.  HENT: Positive for rhinorrhea and sneezing. Negative for ear pain, postnasal drip, sinus pressure and sore throat.   Respiratory: Positive for cough. Negative for shortness of breath and wheezing.   Cardiovascular: Negative for chest pain.  Gastrointestinal: Positive for nausea and abdominal pain. Negative for vomiting.  Genitourinary: Positive for dysuria, urgency, frequency, hematuria and pelvic pain. Negative for flank pain.  Neurological: Negative for headaches.  All other systems reviewed and are negative.   Social History  Substance Use Topics  . Smoking status: Never Smoker   . Smokeless tobacco: Never Used  . Alcohol Use: No   Objective:   BP 102/60 mmHg  Pulse 84  Temp(Src) 98.2 F (36.8 C) (Oral)  Resp 16  Wt 192 lb 9.6 oz (87.363 kg)  SpO2 98%  Physical Exam  Constitutional: She appears well-developed and well-nourished. No distress.  HENT:  Head: Normocephalic and atraumatic.  Right Ear: Hearing, tympanic membrane, external ear and ear canal normal.  Left Ear: Hearing, tympanic membrane, external ear and ear canal normal.  Nose: Nose normal.  Mouth/Throat: Uvula is midline, oropharynx is clear and moist and mucous membranes are normal. No oropharyngeal exudate, posterior oropharyngeal edema or posterior oropharyngeal erythema.  Eyes: Conjunctivae are normal. Pupils are equal, round,  and reactive to light. Right eye exhibits no discharge. Left eye exhibits no discharge. No scleral icterus.  Neck: Normal range of motion. Neck supple. No tracheal deviation present. No thyromegaly present.  Cardiovascular: Normal rate, regular rhythm and normal heart sounds.  Exam reveals no gallop and no friction rub.   No murmur heard. Pulmonary/Chest: Effort normal and breath  sounds normal. No stridor. No respiratory distress. She has no wheezes. She has no rales.  Abdominal: Soft. Bowel sounds are normal. She exhibits no distension and no mass. There is tenderness in the suprapubic area. There is no rebound, no guarding and no CVA tenderness.  Lymphadenopathy:    She has no cervical adenopathy.  Skin: Skin is warm and dry. She is not diaphoretic.  Vitals reviewed.       Assessment & Plan:     1. Dysuria UA positive for UTI.  Will send urine for culture.  Will begin with empiric treatment as below with bactrim.  Will adjust antibiotic treatment as necessary pending C&S results. Pyridium given for dysuria. She is to call the office if symptoms fail to improve or worsen. - POCT urinalysis dipstick - Urine culture - phenazopyridine (PYRIDIUM) 200 MG tablet; Take 1 tablet (200 mg total) by mouth 3 (three) times daily as needed for pain.  Dispense: 30 tablet; Refill: 0  2. Acute cystitis without hematuria See above medical treatment plan. - sulfamethoxazole-trimethoprim (BACTRIM DS,SEPTRA DS) 800-160 MG tablet; Take 1 tablet by mouth 2 (two) times daily.  Dispense: 20 tablet; Refill: 0 - phenazopyridine (PYRIDIUM) 200 MG tablet; Take 1 tablet (200 mg total) by mouth 3 (three) times daily as needed for pain.  Dispense: 30 tablet; Refill: 0  3. Atony of bladder Patient has to I&O self catheterize herself for urination.  She does this 3 times daily.  4. Cough Being that she has not had any OTC treatment and symptoms just started yesterday I advised her to stay well hydrated and get plenty of rest.  She may use delsym or mucinex DM for cough suppression.  She is to call the office if cough worsens.       Mar Daring, PA-C  [facility]

## 2015-06-27 LAB — URINE CULTURE

## 2015-06-28 ENCOUNTER — Telehealth: Payer: Self-pay

## 2015-06-28 NOTE — Telephone Encounter (Signed)
Patient advised as directed below. Patient verbalized understanding.  

## 2015-06-28 NOTE — Telephone Encounter (Signed)
-----   Message from Mar Daring, PA-C sent at 06/28/2015  8:24 AM EST ----- Urine culture was positive for bacteria enterobacter and is susceptible to bactrim. Continue antibiotic until completed.

## 2015-08-02 ENCOUNTER — Other Ambulatory Visit: Payer: Self-pay | Admitting: Family Medicine

## 2015-08-16 ENCOUNTER — Ambulatory Visit (INDEPENDENT_AMBULATORY_CARE_PROVIDER_SITE_OTHER): Payer: PPO | Admitting: Family Medicine

## 2015-08-16 ENCOUNTER — Encounter: Payer: Self-pay | Admitting: Family Medicine

## 2015-08-16 VITALS — BP 132/76 | HR 64 | Temp 98.0°F | Resp 14 | Wt 198.6 lb

## 2015-08-16 DIAGNOSIS — F411 Generalized anxiety disorder: Secondary | ICD-10-CM | POA: Diagnosis not present

## 2015-08-16 DIAGNOSIS — I10 Essential (primary) hypertension: Secondary | ICD-10-CM

## 2015-08-16 DIAGNOSIS — Z1239 Encounter for other screening for malignant neoplasm of breast: Secondary | ICD-10-CM | POA: Diagnosis not present

## 2015-08-16 DIAGNOSIS — E1142 Type 2 diabetes mellitus with diabetic polyneuropathy: Secondary | ICD-10-CM

## 2015-08-16 DIAGNOSIS — Z13820 Encounter for screening for osteoporosis: Secondary | ICD-10-CM | POA: Diagnosis not present

## 2015-08-16 DIAGNOSIS — R1310 Dysphagia, unspecified: Secondary | ICD-10-CM | POA: Diagnosis not present

## 2015-08-16 DIAGNOSIS — N183 Chronic kidney disease, stage 3 unspecified: Secondary | ICD-10-CM

## 2015-08-16 DIAGNOSIS — E1122 Type 2 diabetes mellitus with diabetic chronic kidney disease: Secondary | ICD-10-CM

## 2015-08-16 LAB — POCT GLYCOSYLATED HEMOGLOBIN (HGB A1C): Hemoglobin A1C: 7.3

## 2015-08-16 LAB — POCT UA - MICROALBUMIN: Microalbumin Ur, POC: 20 mg/L

## 2015-08-16 MED ORDER — CITALOPRAM HYDROBROMIDE 20 MG PO TABS: 20.0000 mg | ORAL_TABLET | Freq: Every day | ORAL | Status: DC

## 2015-08-16 MED ORDER — LOVASTATIN 40 MG PO TABS: 40.0000 mg | ORAL_TABLET | Freq: Every day | ORAL | Status: DC

## 2015-08-16 MED ORDER — METFORMIN HCL 500 MG PO TABS: 500.0000 mg | ORAL_TABLET | Freq: Two times a day (BID) | ORAL | Status: DC

## 2015-08-16 MED ORDER — LISINOPRIL-HYDROCHLOROTHIAZIDE 10-12.5 MG PO TABS: 1.0000 | ORAL_TABLET | Freq: Every day | ORAL | Status: DC

## 2015-08-16 MED ORDER — GLIPIZIDE 10 MG PO TABS: 10.0000 mg | ORAL_TABLET | Freq: Two times a day (BID) | ORAL | Status: DC

## 2015-08-16 NOTE — Progress Notes (Signed)
Patient ID: Cynthia Sims, female   DOB: 1950-09-09, 65 y.o.   MRN: [id number]   [name] Female    DOB: Feb 03, 1951   64 y.o.   MRN: [id number] Visit Date: 08/16/2015  Today's Provider: [name], PA   Chief Complaint  Patient presents with  . Diabetes  . Anxiety  . Hypertension  . Follow-up   Subjective:    Anxiety Presents for follow-up visit. Onset was more than 5 years ago. Symptoms include nervous/anxious behavior. Primary symptoms comment: Tolerated Citalopram without side effects.. The quality of sleep is good.   Her past medical history is significant for anxiety/panic attacks. There is no history of suicide attempts. Past treatments include SSRIs.    Diabetes Mellitus Type II, Follow-up:   Lab Results  Component Value Date   HGBA1C 7.2 04/12/2015   HGBA1C 7.2* 01/14/2015   Last seen for diabetes 3 months ago.  Management since then includes none. She reports excellent compliance with treatment. She is not having side effects.  Current symptoms include none and have been stable. Home blood sugar records: 200's  Episodes of hypoglycemia? no   Last eye exam was normal on 09-15-14. Weight trend: stable Current diet: well balanced Current exercise: none  ------------------------------------------------------------------------   Hypertension, follow-up:  BP Readings from Last 3 Encounters:  08/16/15 132/76  06/24/15 102/60  05/17/15 116/68    She was last seen for hypertension 3 months ago.  BP at that visit was 102/60. Management since that visit includes none .She reports excellent compliance with treatment. She is not having side effects.  She is not exercising. She is adherent to low salt diet.   Outside blood pressures are not being checked. She is experiencing none.  Patient denies none.   Cardiovascular risk factors include advanced age (older than 54 for men, 74 for women), diabetes mellitus and hypertension.  Use of agents  associated with hypertension: none.   ------------------------------------------------------------------------         Previous Medications   CITALOPRAM (CELEXA) 20 MG TABLET    Take 1 tablet (20 mg total) by mouth at bedtime.   CONJUGATED ESTROGENS (PREMARIN) VAGINAL CREAM    Place vaginally as needed.   CRANBERRY 125 MG TABS    Take 1 tablet by mouth daily.   FERROUS SULFATE 325 (65 FE) MG TABLET    Take 1 tablet by mouth daily.   GLIPIZIDE (GLUCOTROL) 10 MG TABLET    Take 1 tablet (10 mg total) by mouth 2 (two) times daily.   LISINOPRIL-HYDROCHLOROTHIAZIDE (PRINZIDE,ZESTORETIC) 10-12.5 MG PER TABLET    Take 1 tablet by mouth daily.   LOVASTATIN (MEVACOR) 40 MG TABLET    Take 1 tablet (40 mg total) by mouth daily.   METFORMIN (GLUCOPHAGE) 500 MG TABLET    TAKE ONE TABLET BY MOUTH ONCE DAILY WITH BREAKFAST   No Known Allergies  Review of Systems  Constitutional: Negative.   HENT: Negative.   Eyes: Negative.   Respiratory: Negative.   Cardiovascular: Negative.   Gastrointestinal: Negative.   Endocrine: Negative.   Genitourinary: Negative.   Musculoskeletal: Negative.   Skin: Negative.   Allergic/Immunologic: Negative.   Neurological: Negative.   Hematological: Negative.   Psychiatric/Behavioral: The patient is nervous/anxious.     Social History  Substance Use Topics  . Smoking status: Never Smoker   . Smokeless tobacco: Never Used  . Alcohol Use: No   Objective:   BP 132/76 mmHg  Pulse 64  Temp(Src) 98 F (36.7 C) (  Oral)  Resp 14  Wt 198 lb 9.6 oz (90.084 kg)  Physical Exam  Constitutional: She is oriented to person, place, and time. She appears well-developed and well-nourished.  HENT:  Head: Normocephalic.  Right Ear: External ear normal.  Left Ear: External ear normal.  Nose: Nose normal.  Mouth/Throat: Oropharynx is clear and moist.  Eyes: Conjunctivae and EOM are normal.  Neck: Normal range of motion. Neck supple. No thyromegaly present.    Cardiovascular: Normal rate, regular rhythm, normal heart sounds and intact distal pulses.   Pulmonary/Chest: Effort normal and breath sounds normal.  Abdominal: Soft. Bowel sounds are normal.  Musculoskeletal: Normal range of motion.  Lymphadenopathy:    She has no cervical adenopathy.  Neurological: She is alert and oriented to person, place, and time. She has normal strength and normal reflexes. A sensory deficit is present.  Loss of sensation of the plantar surface of feet.   Skin: No rash noted.  Psychiatric: Her behavior is normal. Thought content normal. Her mood appears anxious.      Assessment & Plan:     1. Anxiety state Stable and tolerating Celexa. Sleeping well and no recent panic attacks. No history of suicidal ideation. Will refill the Celexa and recheck prn. - citalopram (CELEXA) 20 MG tablet; Take 1 tablet (20 mg total) by mouth at bedtime.  Dispense: 30 tablet; Refill: 6  2. Chronic kidney disease (CKD), stage III (moderate) Suspected secondary to diabetes with hypertension. On 05-17-15, creatinine was 1.56 with GFRof 34. Will recheck labs. May need referral to nephrologist pending reports.  3. Essential hypertension Well controlled since starting the Prinzide 10-12.5 mg qd. Refill medication and get follow up labs. - lisinopril-hydrochlorothiazide (PRINZIDE,ZESTORETIC) 10-12.5 MG tablet; Take 1 tablet by mouth daily.  Dispense: 30 tablet; Refill: 6 - TSH  4. Swallowing difficulty Problems swallowing steak or bread and dyspepsia over the past 5 years. More noticeable recently. No hematemesis, melena or hematochezia. May use antacid or PPI for dyspepsia. Will schedule for GI referral and possible upper endoscopy with esophageal dilation. - Ambulatory referral to Gastroenterology  5. Type 2 diabetes mellitus with diabetic polyneuropathy, without long-term current use of insulin (HCC) Hgb A1C was 7.2 on 04-12-15. FBS was 245 this morning. Still taking Metformin and  Glipizide regularly with Lovastatin. Must follow low fat diet with exercise as tolerated. Refill meds as needed and has noticed more sensation loss in bottom of feet. Will schedule referral to podiatrist for diabetic screening. - metFORMIN (GLUCOPHAGE) 500 MG tablet; Take 1 tablet (500 mg total) by mouth 2 (two) times daily with a meal.  Dispense: 60 tablet; Refill: 3 - Ambulatory referral to Podiatry  6. Type 2 diabetes mellitus with stage 3 chronic kidney disease, without long-term current use of insulin ([place]) Refill meds and get routine labs. - glipiZIDE (GLUCOTROL) 10 MG tablet; Take 1 tablet (10 mg total) by mouth 2 (two) times daily.  Dispense: 60 tablet; Refill: 6 - lovastatin (MEVACOR) 40 MG tablet; Take 1 tablet (40 mg total) by mouth daily.  Dispense: 30 tablet; Refill: 6 - POCT glycosylated hemoglobin (Hb A1C) - Comprehensive metabolic panel - CBC with Differential/Platelet - POCT UA - Microalbumin - Lipid panel - Ambulatory referral to Podiatry  7. Osteoporosis screening - DG Bone Density; Future - DG Bone Density  8. Breast cancer screening - MM Digital Screening; Future - MM Digital Screening

## 2015-08-17 ENCOUNTER — Telehealth: Payer: Self-pay | Admitting: Family Medicine

## 2015-08-17 LAB — CBC WITH DIFFERENTIAL/PLATELET
Basophils Absolute: 0 10*3/uL (ref 0.0–0.2)
Basos: 0 %
EOS (ABSOLUTE): 0.1 10*3/uL (ref 0.0–0.4)
Eos: 1 %
Hematocrit: 33.6 % — ABNORMAL LOW (ref 34.0–46.6)
Hemoglobin: 11.1 g/dL (ref 11.1–15.9)
Immature Grans (Abs): 0 10*3/uL (ref 0.0–0.1)
Immature Granulocytes: 0 %
Lymphocytes Absolute: 2.1 10*3/uL (ref 0.7–3.1)
Lymphs: 41 %
MCH: 30.6 pg (ref 26.6–33.0)
MCHC: 33 g/dL (ref 31.5–35.7)
MCV: 93 fL (ref 79–97)
Monocytes Absolute: 0.4 10*3/uL (ref 0.1–0.9)
Monocytes: 7 %
Neutrophils Absolute: 2.7 10*3/uL (ref 1.4–7.0)
Neutrophils: 51 %
Platelets: 132 10*3/uL — ABNORMAL LOW (ref 150–379)
RBC: 3.63 x10E6/uL — ABNORMAL LOW (ref 3.77–5.28)
RDW: 14.3 % (ref 12.3–15.4)
WBC: 5.2 10*3/uL (ref 3.4–10.8)

## 2015-08-17 LAB — COMPREHENSIVE METABOLIC PANEL
ALT: 38 [IU]/L — ABNORMAL HIGH (ref 0–32)
AST: 26 [IU]/L (ref 0–40)
Albumin/Globulin Ratio: 1.7 (ref 1.1–2.5)
Albumin: 4.1 g/dL (ref 3.6–4.8)
Alkaline Phosphatase: 93 [IU]/L (ref 39–117)
BUN/Creatinine Ratio: 16 (ref 11–26)
BUN: 22 mg/dL (ref 8–27)
Bilirubin Total: 0.2 mg/dL (ref 0.0–1.2)
CO2: 21 mmol/L (ref 18–29)
Calcium: 9 mg/dL (ref 8.7–10.3)
Chloride: 100 mmol/L (ref 96–106)
Creatinine, Ser: 1.36 mg/dL — ABNORMAL HIGH (ref 0.57–1.00)
GFR calc Af Amer: 47 mL/min/{1.73_m2} — ABNORMAL LOW (ref >59)
GFR calc non Af Amer: 41 mL/min/{1.73_m2} — ABNORMAL LOW (ref >59)
Globulin, Total: 2.4 g/dL (ref 1.5–4.5)
Glucose: 130 mg/dL — ABNORMAL HIGH (ref 65–99)
Potassium: 4.9 mmol/L (ref 3.5–5.2)
Sodium: 137 mmol/L (ref 134–144)
Total Protein: 6.5 g/dL (ref 6.0–8.5)

## 2015-08-17 LAB — LIPID PANEL
Chol/HDL Ratio: 2.9 {ratio} (ref 0.0–4.4)
Cholesterol, Total: 143 mg/dL (ref 100–199)
HDL: 49 mg/dL (ref >39)
LDL Calculated: 66 mg/dL (ref 0–99)
Triglycerides: 141 mg/dL (ref 0–149)
VLDL Cholesterol Cal: 28 mg/dL (ref 5–40)

## 2015-08-17 LAB — TSH: TSH: 1.49 u[IU]/mL (ref 0.450–4.500)

## 2015-08-17 NOTE — Telephone Encounter (Signed)
Advised daughter as below. Left samples of Lyrica 50mg  for patient to pick up. Patient was also advised.

## 2015-08-17 NOTE — Telephone Encounter (Signed)
Pt's daughter Alyse Low stated that when pt was in for her OV 08/16/15 Simona Huh said he wanted pt to try Lyrica and would send it to [facility]. Pt went to pick up medications but Lyrica RX wasn't sent to pharmacy. Please advise. Thanks TNP

## 2015-09-01 ENCOUNTER — Ambulatory Visit
Admission: RE | Admit: 2015-09-01 | Discharge: 2015-09-01 | Disposition: A | Discharge status: Home / Self Care | Payer: PPO | Source: Ambulatory Visit | Attending: Family Medicine | Admitting: Family Medicine

## 2015-09-01 ENCOUNTER — Other Ambulatory Visit: Payer: Self-pay | Admitting: Family Medicine

## 2015-09-01 DIAGNOSIS — Z1231 Encounter for screening mammogram for malignant neoplasm of breast: Secondary | ICD-10-CM | POA: Insufficient documentation

## 2015-09-01 DIAGNOSIS — Z1239 Encounter for other screening for malignant neoplasm of breast: Secondary | ICD-10-CM

## 2015-09-03 ENCOUNTER — Telehealth: Payer: Self-pay

## 2015-09-03 NOTE — Telephone Encounter (Signed)
Patient advised as directed below. Patient verbalized understanding.  

## 2015-09-03 NOTE — Telephone Encounter (Signed)
-----   Message from Margo Common, [place] sent at 09/03/2015  8:05 AM EDT ----- No sign of osteoporosis on bone density test. Continue Calcium (1200 mg qd) and Vitamin-D (800 IU qd) to prevent bone loss. Recheck test in 2 years.

## 2015-09-03 NOTE — Telephone Encounter (Signed)
-----   Message from Margo Common, [place] sent at 09/02/2015  5:01 PM EDT ----- Normal mammograms. No sign of suspicious lesion. Repeat screening in 1 year.

## 2015-09-13 DIAGNOSIS — K219 Gastro-esophageal reflux disease without esophagitis: Secondary | ICD-10-CM | POA: Insufficient documentation

## 2015-09-13 DIAGNOSIS — R740 Nonspecific elevation of levels of transaminase and lactic acid dehydrogenase [LDH]: Secondary | ICD-10-CM

## 2015-09-13 DIAGNOSIS — IMO0001 Reserved for inherently not codable concepts without codable children: Secondary | ICD-10-CM | POA: Insufficient documentation

## 2015-09-13 DIAGNOSIS — Z86010 Personal history of colonic polyps: Secondary | ICD-10-CM | POA: Insufficient documentation

## 2015-09-13 DIAGNOSIS — Z860101 Personal history of adenomatous and serrated colon polyps: Secondary | ICD-10-CM | POA: Insufficient documentation

## 2015-09-13 DIAGNOSIS — R7401 Elevation of levels of liver transaminase levels: Secondary | ICD-10-CM | POA: Insufficient documentation

## 2015-09-13 DIAGNOSIS — R1319 Other dysphagia: Secondary | ICD-10-CM | POA: Insufficient documentation

## 2015-10-15 ENCOUNTER — Encounter: Payer: Self-pay | Admitting: Physician Assistant

## 2015-10-15 ENCOUNTER — Ambulatory Visit (INDEPENDENT_AMBULATORY_CARE_PROVIDER_SITE_OTHER): Payer: PPO | Admitting: Physician Assistant

## 2015-10-15 VITALS — BP 106/58 | HR 74 | Temp 97.9°F | Resp 16 | Wt 196.0 lb

## 2015-10-15 DIAGNOSIS — N390 Urinary tract infection, site not specified: Secondary | ICD-10-CM

## 2015-10-15 LAB — POCT URINALYSIS DIPSTICK
Bilirubin, UA: NEGATIVE
Glucose, UA: NEGATIVE
Ketones, UA: NEGATIVE
Nitrite, UA: POSITIVE
Protein, UA: NEGATIVE
Spec Grav, UA: 1.025
Urobilinogen, UA: 0.2
pH, UA: 6

## 2015-10-15 MED ORDER — SULFAMETHOXAZOLE-TRIMETHOPRIM 800-160 MG PO TABS: 1.0000 | ORAL_TABLET | Freq: Two times a day (BID) | ORAL | Status: DC

## 2015-10-15 NOTE — Patient Instructions (Signed)

## 2015-10-15 NOTE — Progress Notes (Signed)
Patient ID: Cynthia Sims, female   DOB: [birth year], 65 y.o.   MRN: [id number]       [name] Female    DOB: December 31, 1950   65 y.o.   MRN: [id number] Visit Date: 10/15/2015  Today's Provider: Mar Daring, PA-C   Chief Complaint  Patient presents with  . Urinary Tract Infection   Subjective:    HPI Pt is here today for a possible UTI. She reports that her urine started looking cloudy yesterday and this is how her UTIs typically present. She has to cath herself and she gets UTIs frequently. She denies any other UTI symptoms. 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 She does take cranberry tablets daily.    No Known Allergies Previous Medications   ASPIRIN EC 81 MG TABLET    Take by mouth.   CITALOPRAM (CELEXA) 20 MG TABLET    Take 1 tablet (20 mg total) by mouth at bedtime.   CONJUGATED ESTROGENS (PREMARIN) VAGINAL CREAM    Place vaginally as needed.   CRANBERRY 125 MG TABS    Take 1 tablet by mouth daily.   FERROUS SULFATE 325 (65 FE) MG TABLET    Take 1 tablet by mouth daily.   GLIPIZIDE (GLUCOTROL) 10 MG TABLET    Take 1 tablet (10 mg total) by mouth 2 (two) times daily.   LISINOPRIL-HYDROCHLOROTHIAZIDE (PRINZIDE,ZESTORETIC) 10-12.5 MG TABLET    Take 1 tablet by mouth daily.   LOVASTATIN (MEVACOR) 40 MG TABLET    Take 1 tablet (40 mg total) by mouth daily.   METFORMIN (GLUCOPHAGE) 500 MG TABLET    Take 1 tablet (500 mg total) by mouth 2 (two) times daily with a meal.    Review of Systems  Constitutional: Negative.   HENT: Negative.   Eyes: Negative.   Respiratory: Negative.   Cardiovascular: Negative.   Gastrointestinal: Negative.   Endocrine: Negative.   Genitourinary: Negative.        Cloudy urine  Musculoskeletal: Negative.   Skin: Negative.   Allergic/Immunologic: Negative.   Neurological: Negative.   Hematological: Negative.   Psychiatric/Behavioral: Negative.     Social History  Substance Use Topics  . Smoking status:  Never Smoker   . Smokeless tobacco: Never Used  . Alcohol Use: No   Objective:   BP 106/58 mmHg  Pulse 74  Temp(Src) 97.9 F (36.6 C) (Oral)  Resp 16  Wt 196 lb (88.905 kg)  Physical Exam  Constitutional: She is oriented to person, place, and time. She appears well-developed and well-nourished. No distress.  Cardiovascular: Normal rate, regular rhythm and normal heart sounds.  Exam reveals no gallop and no friction rub.   No murmur heard. Pulmonary/Chest: Effort normal and breath sounds normal. No respiratory distress. She has no wheezes. She has no rales.  Abdominal: Soft. Normal appearance and bowel sounds are normal. She exhibits no distension and no mass. There is no hepatosplenomegaly. There is tenderness in the suprapubic area. There is no rebound, no guarding and no CVA tenderness.  Suprapubic pressure  Neurological: She is alert and oriented to person, place, and time.  Skin: Skin is warm and dry. She is not diaphoretic.        Assessment & Plan:     1. Recurrent UTI UA was positive for pyuria. Will treat empirically with bactrim as below. Upon review of previous culture, positive for enterobacter and had multiple antibiotic resistance. Was susceptible to bactrim. Will still send for  culture to make sure no new resistance has occurred. Will f/u pending culture results. Continue cranberry and increasing fluids. Call if symptoms fail to improve or worsen. - POCT urinalysis dipstick - sulfamethoxazole-trimethoprim (BACTRIM DS,SEPTRA DS) 800-160 MG tablet; Take 1 tablet by mouth 2 (two) times daily.  Dispense: 20 tablet; Refill: 0 - Urine Culture       Mar Daring, PA-C  [facility]

## 2015-10-18 ENCOUNTER — Telehealth: Payer: Self-pay

## 2015-10-18 LAB — URINE CULTURE

## 2015-10-18 NOTE — Telephone Encounter (Signed)
Left message to call back  

## 2015-10-18 NOTE — Telephone Encounter (Signed)
-----   Message from Mar Daring, [place] sent at 10/18/2015  1:46 PM EDT ----- Urine culture was positive for klebsiella and is susceptible to bactrim. Continue until completed and call if symptoms persist.

## 2015-10-19 NOTE — Telephone Encounter (Signed)
Pt is returning call.  CB#[phone]/MW

## 2015-10-19 NOTE — Telephone Encounter (Signed)
Patient advised as directed below. Voiced understanding.  Thanks,  -Joseline 

## 2015-10-28 ENCOUNTER — Encounter: Payer: Self-pay | Admitting: *Deleted

## 2015-10-29 ENCOUNTER — Encounter
Admission: RE | Disposition: A | Discharge status: Home / Self Care | Payer: Self-pay | Source: Ambulatory Visit | Attending: Unknown Physician Specialty

## 2015-10-29 ENCOUNTER — Ambulatory Visit: Payer: PPO | Admitting: Anesthesiology

## 2015-10-29 ENCOUNTER — Ambulatory Visit
Admission: RE | Admit: 2015-10-29 | Discharge: 2015-10-29 | Disposition: A | Discharge status: Home / Self Care | Payer: PPO | Source: Ambulatory Visit | Attending: Unknown Physician Specialty | Admitting: Unknown Physician Specialty

## 2015-10-29 ENCOUNTER — Encounter: Payer: Self-pay | Admitting: Anesthesiology

## 2015-10-29 DIAGNOSIS — D125 Benign neoplasm of sigmoid colon: Secondary | ICD-10-CM | POA: Insufficient documentation

## 2015-10-29 DIAGNOSIS — Z86010 Personal history of colonic polyps: Secondary | ICD-10-CM | POA: Diagnosis not present

## 2015-10-29 DIAGNOSIS — K640 First degree hemorrhoids: Secondary | ICD-10-CM | POA: Diagnosis not present

## 2015-10-29 DIAGNOSIS — K449 Diaphragmatic hernia without obstruction or gangrene: Secondary | ICD-10-CM | POA: Insufficient documentation

## 2015-10-29 DIAGNOSIS — Z79890 Hormone replacement therapy (postmenopausal): Secondary | ICD-10-CM | POA: Diagnosis not present

## 2015-10-29 DIAGNOSIS — Z7984 Long term (current) use of oral hypoglycemic drugs: Secondary | ICD-10-CM | POA: Insufficient documentation

## 2015-10-29 DIAGNOSIS — D122 Benign neoplasm of ascending colon: Secondary | ICD-10-CM | POA: Diagnosis not present

## 2015-10-29 DIAGNOSIS — K222 Esophageal obstruction: Secondary | ICD-10-CM | POA: Insufficient documentation

## 2015-10-29 DIAGNOSIS — Z7982 Long term (current) use of aspirin: Secondary | ICD-10-CM | POA: Diagnosis not present

## 2015-10-29 DIAGNOSIS — G4730 Sleep apnea, unspecified: Secondary | ICD-10-CM | POA: Insufficient documentation

## 2015-10-29 DIAGNOSIS — M1990 Unspecified osteoarthritis, unspecified site: Secondary | ICD-10-CM | POA: Diagnosis not present

## 2015-10-29 DIAGNOSIS — D649 Anemia, unspecified: Secondary | ICD-10-CM | POA: Insufficient documentation

## 2015-10-29 DIAGNOSIS — K2950 Unspecified chronic gastritis without bleeding: Secondary | ICD-10-CM | POA: Insufficient documentation

## 2015-10-29 DIAGNOSIS — E7800 Pure hypercholesterolemia, unspecified: Secondary | ICD-10-CM | POA: Insufficient documentation

## 2015-10-29 DIAGNOSIS — Z79899 Other long term (current) drug therapy: Secondary | ICD-10-CM | POA: Insufficient documentation

## 2015-10-29 DIAGNOSIS — E1151 Type 2 diabetes mellitus with diabetic peripheral angiopathy without gangrene: Secondary | ICD-10-CM | POA: Diagnosis not present

## 2015-10-29 DIAGNOSIS — R197 Diarrhea, unspecified: Secondary | ICD-10-CM | POA: Diagnosis not present

## 2015-10-29 DIAGNOSIS — I10 Essential (primary) hypertension: Secondary | ICD-10-CM | POA: Diagnosis not present

## 2015-10-29 DIAGNOSIS — R1310 Dysphagia, unspecified: Secondary | ICD-10-CM | POA: Diagnosis not present

## 2015-10-29 HISTORY — DX: Essential (primary) hypertension: I10

## 2015-10-29 HISTORY — DX: Unspecified hemorrhoids: K64.9

## 2015-10-29 HISTORY — DX: Elevation of levels of liver transaminase levels: R74.01

## 2015-10-29 HISTORY — DX: Unspecified osteoarthritis, unspecified site: M19.90

## 2015-10-29 HISTORY — DX: Disorder of kidney and ureter, unspecified: N28.9

## 2015-10-29 HISTORY — DX: Anxiety disorder, unspecified: F41.9

## 2015-10-29 HISTORY — DX: Type 2 diabetes mellitus without complications: E11.9

## 2015-10-29 HISTORY — DX: Gastro-esophageal reflux disease without esophagitis: K21.9

## 2015-10-29 HISTORY — DX: Pure hypercholesterolemia, unspecified: E78.00

## 2015-10-29 HISTORY — PX: ESOPHAGOGASTRODUODENOSCOPY (EGD) WITH PROPOFOL: SHX5813

## 2015-10-29 HISTORY — PX: COLONOSCOPY WITH PROPOFOL: SHX5780

## 2015-10-29 HISTORY — DX: Nonspecific elevation of levels of transaminase and lactic acid dehydrogenase (ldh): R74.0

## 2015-10-29 HISTORY — DX: Anemia, unspecified: D64.9

## 2015-10-29 LAB — GLUCOSE, CAPILLARY: Glucose-Capillary: 79 mg/dL (ref 65–99)

## 2015-10-29 LAB — SURGICAL PATHOLOGY

## 2015-10-29 SURGERY — COLONOSCOPY WITH PROPOFOL
Anesthesia: General

## 2015-10-29 MED ORDER — MIDAZOLAM HCL 2 MG/2ML IJ SOLN
INTRAMUSCULAR | Status: DC | PRN
  Administered 2015-10-29: 1 mg via INTRAVENOUS

## 2015-10-29 MED ORDER — FENTANYL CITRATE (PF) 100 MCG/2ML IJ SOLN
INTRAMUSCULAR | Status: DC | PRN
  Administered 2015-10-29: 50 ug via INTRAVENOUS

## 2015-10-29 MED ORDER — SODIUM CHLORIDE 0.9 % IV SOLN
INTRAVENOUS | Status: DC
  Administered 2015-10-29: 1000 mL via INTRAVENOUS

## 2015-10-29 MED ORDER — PROPOFOL 500 MG/50ML IV EMUL
INTRAVENOUS | Status: DC | PRN
  Administered 2015-10-29: 120 ug/kg/min via INTRAVENOUS

## 2015-10-29 MED ORDER — GLYCOPYRROLATE 0.2 MG/ML IJ SOLN
INTRAMUSCULAR | Status: DC | PRN
  Administered 2015-10-29: 0.1 mg via INTRAVENOUS

## 2015-10-29 MED ORDER — LIDOCAINE HCL (CARDIAC) 20 MG/ML IV SOLN
INTRAVENOUS | Status: DC | PRN
  Administered 2015-10-29: 30 mg via INTRAVENOUS

## 2015-10-29 MED ORDER — BUTAMBEN-TETRACAINE-BENZOCAINE 2-2-14 % EX AERO
INHALATION_SPRAY | CUTANEOUS | Status: DC | PRN
  Administered 2015-10-29: 1 via TOPICAL

## 2015-10-29 NOTE — Anesthesia Procedure Notes (Signed)
Performed by: COOK-MARTIN, Nevae Pinnix Pre-anesthesia Checklist: Patient identified, Emergency Drugs available, Suction available, Patient being monitored and Timeout performed Patient Re-evaluated:Patient Re-evaluated prior to inductionOxygen Delivery Method: Nasal cannula Preoxygenation: Pre-oxygenation with 100% oxygen Intubation Type: IV induction Airway Equipment and Method: Bite block Placement Confirmation: CO2 detector and positive ETCO2     

## 2015-10-29 NOTE — Op Note (Signed)
[facility] Gastroenterology Patient Name: Cynthia Sims Procedure Date: 10/29/2015 9:28 AM MRN: [id number] Account #: [id number] Date of Birth: June 04, 1951 Admit Type: Outpatient Age: 65 Room: [facility] ENDO ROOM 1 Gender: Female Note Status: Finalized Procedure:            Upper GI endoscopy Indications:          Dysphagia Providers:            Manya Silvas, MD Referring MD:         Signe Colt (Referring MD) Medicines:            Propofol per Anesthesia Complications:        No immediate complications. Procedure:            Pre-Anesthesia Assessment:                       - After reviewing the risks and benefits, the patient                        was deemed in satisfactory condition to undergo the                        procedure.                       After obtaining informed consent, the endoscope was                        passed under direct vision. Throughout the procedure,                        the patient's blood pressure, pulse, and oxygen                        saturations were monitored continuously. The Endoscope                        was introduced through the mouth, and advanced to the                        second part of duodenum. The upper GI endoscopy was                        accomplished without difficulty. The patient tolerated                        the procedure well. Findings:      A mild Schatzki ring (acquired) was found in the lower third of the       esophagus and at the gastroesophageal junction. At the end of the       procedure A guidewire was placed and the scope was withdrawn. Dilation       was performed with a Savary dilator with mild resistance at 16 mm and 17       mm.      Diffuse mild inflammation characterized by erythema and granularity was       found in the gastric body and in the gastric antrum. Biopsies were taken       with a cold forceps for histology. Biopsies were taken with a cold       forceps for  Helicobacter pylori testing.  The examined duodenum was normal.      A small hiatal hernia was present. Impression:           - Mild Schatzki ring. Dilated.                       - Gastritis. Biopsied.                       - Normal examined duodenum. Recommendation:       - Await pathology results.                       - soft food for 3 days, eat slowly, chew well, take                        small bites Manya Silvas, MD 10/29/2015 9:56:47 AM This report has been signed electronically. Number of Addenda: 0 Note Initiated On: 10/29/2015 9:28 AM      [facility]

## 2015-10-29 NOTE — Anesthesia Postprocedure Evaluation (Signed)
Anesthesia Post Note  [name]  Procedure(s) Performed: Procedure(s) (LRB): COLONOSCOPY WITH PROPOFOL (N/A) ESOPHAGOGASTRODUODENOSCOPY (EGD) WITH PROPOFOL (N/A)  Patient location during evaluation: Endoscopy Anesthesia Type: General Level of consciousness: awake and alert Pain management: pain level controlled Vital Signs Assessment: post-procedure vital signs reviewed and stable Respiratory status: spontaneous breathing, nonlabored ventilation, respiratory function stable and patient connected to nasal cannula oxygen Cardiovascular status: blood pressure returned to baseline and stable Postop Assessment: no signs of nausea or vomiting Anesthetic complications: no    Last Vitals:  Filed Vitals:   10/29/15 1050 10/29/15 1100  BP: 127/64 115/86  Pulse: 66 70  Temp:    Resp: 15 14    Last Pain: There were no vitals filed for this visit.               Martha Clan

## 2015-10-29 NOTE — Transfer of Care (Signed)
Immediate Anesthesia Transfer of Care Note  [name]  Procedure(s) Performed: Procedure(s): COLONOSCOPY WITH PROPOFOL (N/A) ESOPHAGOGASTRODUODENOSCOPY (EGD) WITH PROPOFOL (N/A)  Patient Location: PACU  Anesthesia Type:General  Level of Consciousness: awake, alert , oriented and sedated  Airway & Oxygen Therapy: Patient Spontanous Breathing and Patient connected to nasal cannula oxygen  Post-op Assessment: Report given to RN and Post -op Vital signs reviewed and stable  Post vital signs: Reviewed and stable  Last Vitals:  Filed Vitals:   10/29/15 0840  BP: 140/57  Pulse: 70  Temp: 35.4 C  Resp: 16    Last Pain: There were no vitals filed for this visit.       Complications: No apparent anesthesia complications

## 2015-10-29 NOTE — H&P (Signed)
Primary Care Physician:  [name], PA Primary Gastroenterologist:  Dr. Vira Agar  Pre-Procedure History & Physical: HPI:  Cynthia Sims is a 65 y.o. female is here for an endoscopy and colonoscopy.   Past Medical History  Diagnosis Date  . Anemia   . Anxiety   . Arthritis   . Diabetes mellitus without complication ([place])   . GERD (gastroesophageal reflux disease)   . Hypertension   . Elevated cholesterol   . Kidney disease   . Hemorrhoids   . ALT (SGPT) level raised     Past Surgical History  Procedure Laterality Date  . Cesarean section  1983  . Dilation and curettage of uterus  2001  . Cesarean section    . Cholecystectomy      Prior to Admission medications   Medication Sig Start Date End Date Taking? Authorizing Provider  aspirin EC 81 MG tablet Take by mouth.   Yes Historical Provider, MD  citalopram (CELEXA) 20 MG tablet Take 1 tablet (20 mg total) by mouth at bedtime. 08/16/15  Yes Dennis E Chrismon, PA  conjugated estrogens (PREMARIN) vaginal cream Place vaginally as needed.   Yes Historical Provider, MD  Cranberry 125 MG TABS Take 1 tablet by mouth daily.   Yes Historical Provider, MD  ferrous sulfate 325 (65 FE) MG tablet Take 1 tablet by mouth daily.   Yes Historical Provider, MD  glipiZIDE (GLUCOTROL) 10 MG tablet Take 1 tablet (10 mg total) by mouth 2 (two) times daily. 08/16/15  Yes Dennis E Chrismon, PA  lisinopril-hydrochlorothiazide (PRINZIDE,ZESTORETIC) 10-12.5 MG tablet Take 1 tablet by mouth daily. 08/16/15  Yes Dennis E Chrismon, PA  lovastatin (MEVACOR) 40 MG tablet Take 1 tablet (40 mg total) by mouth daily. 08/16/15  Yes Dennis E Chrismon, PA  metFORMIN (GLUCOPHAGE) 500 MG tablet Take 1 tablet (500 mg total) by mouth 2 (two) times daily with a meal. 08/16/15  Yes Dennis E Chrismon, PA  sulfamethoxazole-trimethoprim (BACTRIM DS,SEPTRA DS) 800-160 MG tablet Take 1 tablet by mouth 2 (two) times daily. 10/15/15  Yes Mar Daring, PA-C    Allergies as  of 10/20/2015  . (No Known Allergies)    Family History  Problem Relation Age of Onset  . Alzheimer's disease Mother   . COPD Mother   . Breast cancer Mother 65  . Osteoporosis Mother   . Heart attack Father   . Lupus Father   . Diabetes Father   . Hypertension Father   . Diabetes Sister   . COPD Maternal Grandmother   . Diabetes Paternal Grandfather   . Diabetes Sister   . Diabetes Sister     Social History   Social History  . Marital Status: Married    Spouse Name: N/A  . Number of Children: N/A  . Years of Education: N/A   Occupational History  . Not on file.   Social History Main Topics  . Smoking status: Never Smoker   . Smokeless tobacco: Never Used  . Alcohol Use: No  . Drug Use: No  . Sexual Activity: Not on file   Other Topics Concern  . Not on file   Social History Narrative    Review of Systems: See HPI, otherwise negative ROS  Physical Exam: BP 140/57 mmHg  Pulse 70  Temp(Src) 95.8 F (35.4 C) (Tympanic)  Resp 16  Ht 5\' 1"  (1.549 m)  Wt 83.462 kg (184 lb)  BMI 34.78 kg/m2  SpO2 100% General:   Alert,  pleasant and  cooperative in NAD Head:  Normocephalic and atraumatic. Neck:  Supple; no masses or thyromegaly. Lungs:  Clear throughout to auscultation.    Heart:  Regular rate and rhythm. Abdomen:  Soft, nontender and nondistended. Normal bowel sounds, without guarding, and without rebound.   Neurologic:  Alert and  oriented x4;  grossly normal neurologically.  Impression/Plan: SILVERIA LAKO is here for an endoscopy and colonoscopy to be performed for dysphagia, PH colon polyps.  Also diarrhea,  Risks, benefits, limitations, and alternatives regarding  endoscopy and colonoscopy have been reviewed with the patient.  Questions have been answered.  All parties agreeable.   Gaylyn Cheers, MD  10/29/2015, 9:28 AM

## 2015-10-29 NOTE — Op Note (Signed)
[facility] Gastroenterology Patient Name: Cynthia Sims Procedure Date: 10/29/2015 9:27 AM MRN: [id number] Account #: [id number] Date of Birth: 30-May-1951 Admit Type: Outpatient Age: 65 Room: [facility] ENDO ROOM 1 Gender: Female Note Status: Finalized Procedure:            Colonoscopy Indications:          High risk colon cancer surveillance: Personal history                        of colonic polyps Providers:            Manya Silvas, MD Medicines:            Propofol per Anesthesia Complications:        No immediate complications. Procedure:            Pre-Anesthesia Assessment:                       - After reviewing the risks and benefits, the patient                        was deemed in satisfactory condition to undergo the                        procedure.                       After obtaining informed consent, the colonoscope was                        passed under direct vision. Throughout the procedure,                        the patient's blood pressure, pulse, and oxygen                        saturations were monitored continuously. The                        Colonoscope was introduced through the anus and                        advanced to the the cecum, identified by appendiceal                        orifice and ileocecal valve. The colonoscopy was                        performed without difficulty. The patient tolerated the                        procedure well. The quality of the bowel preparation                        was excellent. Findings:      Due to chronic diarrhea biopsies done of ascending, transverse,       descending and sigmoid colon.      A small polyp was found in the sigmoid colon. The polyp was sessile. The       polyp was removed with a hot snare. Resection and retrieval were       complete. One clip  applied to the site.      A small polyp was found in the ascending colon. The polyp was sessile.       The polyp was removed  with a cold snare. Resection and retrieval were       complete.      A diminutive polyp was found in the cecum. The polyp was sessile. The       polyp was removed with a cold snare. Resection and retrieval were       complete.      A diminutive polyp was found in the ascending colon. The polyp was       sessile. The polyp was removed with a cold biopsy forceps. Resection and       retrieval were complete.      Internal hemorrhoids were found during endoscopy. The hemorrhoids were       small and Grade I (internal hemorrhoids that do not prolapse). Impression:           - One small polyp in the sigmoid colon, removed with a                        hot snare. Resected and retrieved.                       - One small polyp in the ascending colon, removed with                        a cold snare. Resected and retrieved.                       - One diminutive polyp in the cecum, removed with a                        cold snare. Resected and retrieved.                       - One diminutive polyp in the ascending colon, removed                        with a cold biopsy forceps. Resected and retrieved. Recommendation:       - Await pathology results. Manya Silvas, MD 10/29/2015 10:31:23 AM This report has been signed electronically. Number of Addenda: 0 Note Initiated On: 10/29/2015 9:27 AM Scope Withdrawal Time: 0 hours 19 minutes 37 seconds  Total Procedure Duration: 0 hours 26 minutes 46 seconds       [facility]

## 2015-10-29 NOTE — Anesthesia Preprocedure Evaluation (Signed)
Anesthesia Evaluation  Patient identified by MRN, date of birth, ID band Patient awake    Reviewed: Allergy & Precautions, H&P , NPO status , Patient's Chart, lab work & pertinent test results, reviewed documented beta blocker date and time   History of Anesthesia Complications Negative for: history of anesthetic complications  Airway Mallampati: II  TM Distance: >3 FB Neck ROM: full    Dental no notable dental hx. (+) Partial Upper, Partial Lower   Pulmonary neg shortness of breath, sleep apnea (mild) , neg COPD, neg recent URI,    Pulmonary exam normal breath sounds clear to auscultation       Cardiovascular Exercise Tolerance: Good hypertension, On Medications (-) angina+ Peripheral Vascular Disease  (-) CAD, (-) Past MI, (-) Cardiac Stents and (-) CABG Normal cardiovascular exam(-) dysrhythmias (-) Valvular Problems/Murmurs Rhythm:regular Rate:Normal     Neuro/Psych negative neurological ROS  negative psych ROS   GI/Hepatic GERD  ,NAFLD   Endo/Other  diabetes  Renal/GU CRFRenal disease  negative genitourinary   Musculoskeletal   Abdominal   Peds  Hematology  (+) Blood dyscrasia, anemia ,   Anesthesia Other Findings Past Medical History:   Anemia                                                       Anxiety                                                      Arthritis                                                    Diabetes mellitus without complication (HCC)                 GERD (gastroesophageal reflux disease)                       Hypertension                                                 Elevated cholesterol                                         Kidney disease                                               Hemorrhoids                                                  ALT (SGPT)  level raised                                      Reproductive/Obstetrics negative OB ROS                              Anesthesia Physical Anesthesia Plan  ASA: III  Anesthesia Plan: General   Post-op Pain Management:    Induction:   Airway Management Planned:   Additional Equipment:   Intra-op Plan:   Post-operative Plan:   Informed Consent: I have reviewed the patients History and Physical, chart, labs and discussed the procedure including the risks, benefits and alternatives for the proposed anesthesia with the patient or authorized representative who has indicated his/her understanding and acceptance.   Dental Advisory Given  Plan Discussed with: Anesthesiologist, CRNA and Surgeon  Anesthesia Plan Comments:         Anesthesia Quick Evaluation

## 2015-11-02 ENCOUNTER — Encounter: Payer: Self-pay | Admitting: Unknown Physician Specialty

## 2015-11-16 ENCOUNTER — Ambulatory Visit (INDEPENDENT_AMBULATORY_CARE_PROVIDER_SITE_OTHER): Payer: PPO | Admitting: Family Medicine

## 2015-11-16 ENCOUNTER — Encounter: Payer: Self-pay | Admitting: Family Medicine

## 2015-11-16 VITALS — BP 110/68 | HR 68 | Temp 98.6°F | Resp 16 | Ht 61.0 in | Wt 195.0 lb

## 2015-11-16 DIAGNOSIS — Z0000 Encounter for general adult medical examination without abnormal findings: Secondary | ICD-10-CM

## 2015-11-16 DIAGNOSIS — Z23 Encounter for immunization: Secondary | ICD-10-CM | POA: Diagnosis not present

## 2015-11-16 DIAGNOSIS — Z1211 Encounter for screening for malignant neoplasm of colon: Secondary | ICD-10-CM

## 2015-11-16 DIAGNOSIS — N183 Chronic kidney disease, stage 3 unspecified: Secondary | ICD-10-CM

## 2015-11-16 DIAGNOSIS — Z124 Encounter for screening for malignant neoplasm of cervix: Secondary | ICD-10-CM | POA: Diagnosis not present

## 2015-11-16 DIAGNOSIS — E0842 Diabetes mellitus due to underlying condition with diabetic polyneuropathy: Secondary | ICD-10-CM | POA: Diagnosis not present

## 2015-11-16 LAB — IFOBT (OCCULT BLOOD): IFOBT: NEGATIVE

## 2015-11-16 LAB — POCT URINALYSIS DIPSTICK
Bilirubin, UA: NEGATIVE
Glucose, UA: NEGATIVE
Ketones, UA: NEGATIVE
Nitrite, UA: NEGATIVE
Protein, UA: NEGATIVE
Spec Grav, UA: >=1.03
Urobilinogen, UA: 0.2
pH, UA: 6

## 2015-11-16 NOTE — Progress Notes (Signed)
Patient ID: Cynthia Sims, female   DOB: Jan 09, 1951, 66 y.o.   MRN: [id number]       [name], Female    DOB: Mar 16, 1951, 65 y.o.   MRN: [id number] Visit Date: 11/16/2015  Today's Provider: [name], PA   Chief Complaint  Patient presents with  . Annual Exam   Subjective:    Annual wellness visit Cynthia Sims is a 65 y.o. female. She feels fairly well. She reports exercising not regularly. She reports she is sleeping poorly. She sleeps on average 4 hours a night.     Review of Systems  Constitutional: Negative.   HENT: Negative.   Respiratory: Negative.   Cardiovascular: Negative.   Gastrointestinal:       Had colonoscopy and upper endoscopy by Dr. Vira Agar. Had 4 benign polyps and dilated esophageal stricture.  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 Burning and stinging discomfort with use of catheter the past few days. Was treated with Septra-DS for UTI 10-15-15.  Musculoskeletal: Positive for arthralgias.       "Arthritis in back".  Skin: Negative.   Neurological:       Tingling and "pins-and-needles" pains in hands and feet due to diabetic neuropathy.    Social History   Social History  . Marital Status: Married    Spouse Name: N/A  . Number of Children: N/A  . Years of Education: N/A   Occupational History  . Not on file.   Social History Main Topics  . Smoking status: Never Smoker   . Smokeless tobacco: Never Used  . Alcohol Use: No  . Drug Use: No  . Sexual Activity: Not on file   Other Topics Concern  . Not on file   Social History Narrative    Past Medical History  Diagnosis Date  . Anemia   . Anxiety   . Arthritis   . Diabetes mellitus without complication ([facility])   . GERD (gastroesophageal reflux disease)   . Hypertension   . Elevated cholesterol   . Kidney disease   . Hemorrhoids   . ALT (SGPT) level raised      Patient Active Problem List   Diagnosis Date Noted  . Can't get  food down 09/13/2015  . ALT (SGPT) level raised 09/13/2015  . Acid reflux 09/13/2015  . H/O adenomatous polyp of colon 09/13/2015  . Anxiety 01/14/2015  . Hypertension 01/14/2015  . Chronic kidney disease (CKD), stage III (moderate) 01/13/2015  . Cannot sleep 02/26/2009  . Anxiety state 02/26/2009  . 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 Diabetes ([place]) 12/14/2005  . 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 Menopausal symptom 12/14/2005    Past Surgical History  Procedure Laterality Date  . Cesarean section  1983  . Dilation and curettage of uterus  2001  . Cesarean section    . Cholecystectomy    . Colonoscopy with propofol N/A 10/29/2015    Procedure: COLONOSCOPY WITH PROPOFOL;  Surgeon: Manya Silvas, MD;  Location: [facility] ENDOSCOPY;  Service: Endoscopy;  Laterality: N/A;  . Esophagogastroduodenoscopy (egd) with propofol N/A 10/29/2015    Procedure: ESOPHAGOGASTRODUODENOSCOPY (EGD) WITH PROPOFOL;  Surgeon: Manya Silvas, MD;  Location: [facility] END[facility]COPY;  Service: Endoscopy;  Laterality: N/A;    Her family history includes Alzheimer's disease in her mother; Breast cancer (  age of onset: 39) in her mother; COPD in her maternal grandmother and mother; Diabetes in her father, paternal grandfather, sister, sister, and sister; Heart attack in her father; Hypertension in her father; Lupus in her father; Osteoporosis in her mother.    Previous Medications   ASPIRIN EC 81 MG TABLET    Take by mouth.   CITALOPRAM (CELEXA) 20 MG TABLET    Take 1 tablet (20 mg total) by mouth at bedtime.   CONJUGATED ESTROGENS (PREMARIN) VAGINAL CREAM    Place vaginally as needed.   CRANBERRY 125 MG TABS    Take 1 tablet by mouth daily.   FERROUS SULFATE 325 (65 FE) MG TABLET    Take 1 tablet by mouth daily.   GLIPIZIDE (GLUCOTROL) 10 MG TABLET    Take 1 tablet (10 mg total) by  mouth 2 (two) times daily.   LISINOPRIL-HYDROCHLOROTHIAZIDE (PRINZIDE,ZESTORETIC) 10-12.5 MG TABLET    Take 1 tablet by mouth daily.   LOVASTATIN (MEVACOR) 40 MG TABLET    Take 1 tablet (40 mg total) by mouth daily.   METFORMIN (GLUCOPHAGE) 500 MG TABLET    Take 1 tablet (500 mg total) by mouth 2 (two) times daily with a meal.   SULFAMETHOXAZOLE-TRIMETHOPRIM (BACTRIM DS,SEPTRA DS) 800-160 MG TABLET    Take 1 tablet by mouth 2 (two) times daily.    Patient Care Team: Margo Common, PA as PCP - General (Physician Assistant)     Objective:   Vitals: BP 110/68 mmHg  Pulse 68  Temp(Src) 98.6 F (37 C)  Resp 16  Ht 5\' 1"  (1.549 m)  Wt 195 lb (88.451 kg)  BMI 36.86 kg/m2  Physical Exam  Constitutional: She is oriented to person, place, and time. She appears well-developed and well-nourished.  HENT:  Head: Normocephalic and atraumatic.  Right Ear: External ear normal.  Left Ear: External ear normal.  Nose: Nose normal.  Mouth/Throat: Oropharynx is clear and moist.  Eyes: Conjunctivae and EOM are normal. Pupils are equal, round, and reactive to light. Right eye exhibits no discharge.  Neck: Normal range of motion. Neck supple. No tracheal deviation present. No thyromegaly present.  Cardiovascular: Normal rate, regular rhythm, normal heart sounds and intact distal pulses.   No murmur heard. Pulmonary/Chest: Effort normal and breath sounds normal. No respiratory distress. She has no wheezes. She has no rales. She exhibits no tenderness.  Abdominal: Soft. She exhibits no distension and no mass. There is no tenderness. There is no rebound and no guarding.  Genitourinary: Vagina normal and uterus normal. Guaiac negative stool. No vaginal discharge found.  Normal breast exam without masses or lymphadenopathy.  Musculoskeletal: She exhibits no edema.  Fair ROM through out with ache and pain with stiffness of back with history or arthritis.  Lymphadenopathy:    She has no cervical  adenopathy.  Neurological: She is alert and oriented to person, place, and time. She has normal reflexes. No cranial nerve deficit. She exhibits normal muscle tone. Coordination normal.  Numbness in plantar surface of both feet with test of nylon string.   Skin: Skin is warm and dry. No rash noted. No erythema.  Psychiatric: She has a normal mood and affect. Her behavior is normal. Judgment and thought content normal.    Activities of Daily Living In your present state of health, do you have any difficulty performing the following activities: 10/15/2015  Hearing? N  Vision? N  Difficulty concentrating or making decisions? N  Walking or climbing stairs? N  Dressing  or bathing? N  Doing errands, shopping? N    Fall Risk Assessment Fall Risk  10/15/2015  Falls in the past year? No     Depression Screen PHQ 2/9 Scores 10/15/2015  PHQ - 2 Score 0    Cognitive Testing - 6-CIT  Correct? Score   What year is it? yes 0 0 or 4  What month is it? yes 0 0 or 3  Memorize:    Pia Mau,  40,  [address]  [place],      What time is it? (within 1 hour) yes 0 0 or 3  Count backwards from 20 yes 0 0, 2, or 4  Name the months of the year yes 0 0, 2, or 4  Repeat name & address above yes 0 0, 2, 4, 6, 8, or 10       TOTAL SCORE  0/28   Interpretation:  Normal  Normal (0-7) Abnormal (8-28)     Assessment & Plan:     Annual Wellness Visit  Reviewed patient's Family Medical History Reviewed and updated list of patient's medical providers Assessment of cognitive impairment was done Assessed patient's functional ability Established a written schedule for health screening [place] Completed and Reviewed  Exercise Activities and Dietary recommendations Goals    Recommend she work on diabetic diet and walk for exercise 20-30 minutes daily.      Immunization History  Administered Date(s) Administered  . Pneumococcal Conjugate-13 09/25/2014  . Tdap 09/25/2014     Health Maintenance  Topic Date Due  . Hepatitis C Screening  1951/02/26  . OPHTHALMOLOGY EXAM  08/22/1958  . ZOSTAVAX  08/21/2008  . INFLUENZA VACCINE  01/11/2016  . HEMOGLOBIN A1C  02/16/2016  . FOOT EXAM  08/19/2016  . MAMMOGRAM  08/31/2017  . TETANUS/TDAP  09/24/2024  . COLONOSCOPY  10/28/2025  . DEXA SCAN  Completed  . PNA vac Low Risk Adult  Completed      Discussed health benefits of physical activity, and encouraged her to engage in regular exercise appropriate for her age and condition.    ------------------------------------------------------------------------------------------------------------  1. Medicare annual wellness visit, subsequent Good general exam with colonoscopy up to date. Needs pneumovax-23 immunization. Encouraged to get back on a good diabetic diet with walking for exercise daily.  2. Chronic kidney disease (CKD), stage III (moderate) Urinalysis showed moderate leukocytes, high specific gravity with pyuria and bacteriuria on microscopic exam. Creatinine on 08-16-15 was 1.36. With bladder atony, has to use catheter 3-4 times a day. Will get urine culture to rule out infection. - POCT urinalysis dipstick - Urine culture  3. Cervical cancer screening Never has had abnormal PAP. No menses. 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 - Pap IG ([facility])  4. Diabetes mellitus due to underlying condition with diabetic polyneuropathy, without long-term current use of insulin (HCC) Continues to have numbness of both feet plantar surface and followed by Dr. Cleda Mccreedy (podiatrist). Last exam 08-20-15. Continues to take Glipizide 10 mg qd with Metformin 500 mg BID.  5. Colon cancer screening Colonoscopy with 4 benign polyps 10-29-15 by Dr. Vira Agar. - IFOBT POC (occult bld, rslt in office)  6. Need for pneumococcal vaccine Had prevnar-13 last year. Will give pneumovax-23 to complete pneumococcal vaccinations. - Pneumococcal  polysaccharide vaccine 23-valent greater than or equal to 2yo subcutaneous/IM

## 2015-11-17 LAB — URINE CULTURE

## 2015-11-18 ENCOUNTER — Telehealth: Payer: Self-pay

## 2015-11-18 MED ORDER — NITROFURANTOIN MONOHYD MACRO 100 MG PO CAPS: 100.0000 mg | ORAL_CAPSULE | Freq: Two times a day (BID) | ORAL | Status: DC

## 2015-11-18 NOTE — Telephone Encounter (Signed)
-----   Message from Margo Common, [place] sent at 11/18/2015  9:03 AM EDT ----- Low bacterial colony count on culture. Recommend Macrobid 100 mg BID#20 to treat pyuria. Recheck urinalysis in 10-14 days if no better or follow up with urologist.

## 2015-11-18 NOTE — Telephone Encounter (Signed)
Advised patient as below. Medication sent into patient's pharmacy.

## 2015-11-19 ENCOUNTER — Other Ambulatory Visit: Payer: Self-pay | Admitting: Family Medicine

## 2015-11-19 LAB — PAP IG (IMAGE GUIDED): PAP Smear Comment: 0

## 2015-11-19 MED ORDER — SULFAMETHOXAZOLE-TRIMETHOPRIM 800-160 MG PO TABS: 1.0000 | ORAL_TABLET | Freq: Two times a day (BID) | ORAL | Status: DC

## 2015-12-27 ENCOUNTER — Encounter: Payer: PPO | Attending: Nurse Practitioner | Admitting: Dietician

## 2015-12-27 ENCOUNTER — Encounter: Payer: Self-pay | Admitting: Dietician

## 2015-12-27 VITALS — Ht 61.0 in | Wt 187.4 lb

## 2015-12-27 DIAGNOSIS — E1100 Type 2 diabetes mellitus with hyperosmolarity without nonketotic hyperglycemic-hyperosmolar coma (NKHHC): Secondary | ICD-10-CM | POA: Insufficient documentation

## 2015-12-27 DIAGNOSIS — E118 Type 2 diabetes mellitus with unspecified complications: Secondary | ICD-10-CM

## 2015-12-27 DIAGNOSIS — R740 Nonspecific elevation of levels of transaminase and lactic acid dehydrogenase [LDH]: Secondary | ICD-10-CM | POA: Insufficient documentation

## 2015-12-27 DIAGNOSIS — R748 Abnormal levels of other serum enzymes: Secondary | ICD-10-CM

## 2015-12-27 NOTE — Patient Instructions (Signed)
   Control portions of starchy foods; make sure to eat a small amount of a protein food with each meal.   Continue to eat low sugar foods and drink sugar free drinks.   Try some light exercises from a chair to avoid pain in your legs and feet.   Think about tracking your food intake on paper, or using a phone app like LoseIt or MyFitnessPal (both are free)  Check weight regularly, at least once a week.

## 2015-12-27 NOTE — Progress Notes (Signed)
Medical Nutrition Therapy: Visit start time: 0930  end time: 1030  Assessment:  Diagnosis: some elevated liver enzymes, Type 2 DM Past medical history: HTN, HLD, GERD, sleep apnea Psychosocial issues/ stress concerns: depression, anxiety  Preferred learning method:  . Auditory  Current weight: 187.4lbs  Height: 5'1" Medications, supplements: reviewed list in chart with patient  Progress and evaluation: Patient reports some weight fluctuation but no significant weight changes recently.         She reports decreased appetite, is having to make herself eat to prevent hypoglycemia.          She states BGs are also fluctuating.             Physical activity: none due to pain  Dietary Intake:  Usual eating pattern includes 3 meals and 2-3 snacks per day. Dining out frequency: 4 meals per week.  Breakfast: today: Bacon, Egg, and Cheese biscuit and regular soda; sometimes raisin bran or rice krispies Snack: graham crackers with peanut butter, or cookie Lunch: recently tomato sandwiches  Snack: sometimes popcorn or same as am snack Supper: 5pm (tries to eat early) chicken with corn, squash, gravy; sometimes tomato sandwich Snack: pineapple sherbet 7/16 Beverages: water, diet sodas or tea-- sometimes with sweetener or sometimes unsweet  Nutrition Care Education: Topics covered: Diabetes, weight management Basic nutrition: basic food groups, appropriate nutrient balance, appropriate meal and snack schedule, general nutrition guidelines    Weight control: benefits of weight control, behavioral changes for weight loss: portion control, adequate nutrition to promote energy; guidance for 1200kcal daily intake; benefits of tracking food intake and regular weighing.  Diabetes:  appropriate meal and snack schedule, appropriate carb intake and balance Liver health: importance of low fat eating and weight control Other lifestyle changes:  benefits of regular exercise, options for exercises to minimize  pain  Nutritional Diagnosis:  [place]-3.3 Overweight/obesity As related to history of excess calories, intactivity.  As evidenced by patient report.  Intervention: Instruction as noted above.   Used food models to practice basic meal planning.    Set goals with patient input.  Education Materials given:  . General diet guidelines for Diabetes . Food lists/ Planning A Balanced Meal . Sample meal pattern/ menus: Quick and Healthy Meal Ideas . Goals/ instructions  Learner/ who was taught:  . Patient   Level of understanding: Marland Kitchen Verbalizes/ demonstrates competency  Demonstrated degree of understanding via:   Teach back Learning barriers: . None  Willingness to learn/ readiness for change: . Acceptance, ready for change  Monitoring and Evaluation:  Dietary intake, exercise, BG control, liver health, and body weight      follow up: 01/24/16

## 2016-01-04 DIAGNOSIS — A048 Other specified bacterial intestinal infections: Secondary | ICD-10-CM | POA: Insufficient documentation

## 2016-01-06 ENCOUNTER — Encounter (HOSPITAL_COMMUNITY): Payer: Self-pay | Admitting: Emergency Medicine

## 2016-01-06 ENCOUNTER — Emergency Department (HOSPITAL_COMMUNITY)
Admission: EM | Admit: 2016-01-06 | Discharge: 2016-01-06 | Disposition: A | Discharge status: Home / Self Care | Payer: No Typology Code available for payment source | Attending: Emergency Medicine | Admitting: Emergency Medicine

## 2016-01-06 DIAGNOSIS — Y999 Unspecified external cause status: Secondary | ICD-10-CM | POA: Insufficient documentation

## 2016-01-06 DIAGNOSIS — Y9389 Activity, other specified: Secondary | ICD-10-CM | POA: Diagnosis not present

## 2016-01-06 DIAGNOSIS — N183 Chronic kidney disease, stage 3 (moderate): Secondary | ICD-10-CM | POA: Diagnosis not present

## 2016-01-06 DIAGNOSIS — I129 Hypertensive chronic kidney disease with stage 1 through stage 4 chronic kidney disease, or unspecified chronic kidney disease: Secondary | ICD-10-CM | POA: Insufficient documentation

## 2016-01-06 DIAGNOSIS — M545 Low back pain: Secondary | ICD-10-CM | POA: Diagnosis present

## 2016-01-06 DIAGNOSIS — Y92410 Unspecified street and highway as the place of occurrence of the external cause: Secondary | ICD-10-CM | POA: Insufficient documentation

## 2016-01-06 DIAGNOSIS — Z7982 Long term (current) use of aspirin: Secondary | ICD-10-CM | POA: Insufficient documentation

## 2016-01-06 DIAGNOSIS — M25561 Pain in right knee: Secondary | ICD-10-CM | POA: Insufficient documentation

## 2016-01-06 DIAGNOSIS — Z7984 Long term (current) use of oral hypoglycemic drugs: Secondary | ICD-10-CM | POA: Insufficient documentation

## 2016-01-06 DIAGNOSIS — E1122 Type 2 diabetes mellitus with diabetic chronic kidney disease: Secondary | ICD-10-CM | POA: Diagnosis not present

## 2016-01-06 DIAGNOSIS — S39012A Strain of muscle, fascia and tendon of lower back, initial encounter: Secondary | ICD-10-CM | POA: Diagnosis not present

## 2016-01-06 DIAGNOSIS — Z79899 Other long term (current) drug therapy: Secondary | ICD-10-CM | POA: Diagnosis not present

## 2016-01-06 MED ORDER — HYDROCODONE-ACETAMINOPHEN 5-325 MG PO TABS: 1.0000 | ORAL_TABLET | ORAL | 0 refills | Status: DC | PRN
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
## 2016-01-10 ENCOUNTER — Other Ambulatory Visit: Payer: Self-pay | Admitting: Family Medicine

## 2016-01-10 NOTE — Telephone Encounter (Signed)
Pt contacted office for refill request on the following medications:  One Touch Ulta Mini Test stripes and lancets.  [facility]  CB#[phone]/MW

## 2016-01-10 NOTE — Telephone Encounter (Signed)
Please advise refill? 

## 2016-01-11 ENCOUNTER — Other Ambulatory Visit: Payer: Self-pay | Admitting: Family Medicine

## 2016-01-11 MED ORDER — ONETOUCH ULTRASOFT LANCETS MISC: 12 refills | Status: DC

## 2016-01-11 MED ORDER — GLUCOSE BLOOD VI STRP: ORAL_STRIP | 12 refills | Status: DC

## 2016-01-14 ENCOUNTER — Telehealth: Payer: Self-pay

## 2016-01-14 NOTE — Telephone Encounter (Signed)
We received fax from the pharmacy saying that patient checks blood sugar 3x/day. Per Simona Huh, patient does not need to check blood sugar this often unless she is on insulin. Insurance will not pay for more than once a day.   Patient reports that she will just check sugars once daily. Ok to send Rx into the pharmacy? Please advise. Thanks!

## 2016-01-14 NOTE — Telephone Encounter (Signed)
May advise pharmacy the patient knows to only check FBS once a day now.  May refill test strips and lancets if needed.

## 2016-01-17 MED ORDER — ONETOUCH ULTRASOFT LANCETS MISC: 12 refills | Status: DC

## 2016-01-17 MED ORDER — GLUCOSE BLOOD VI STRP: ORAL_STRIP | 12 refills | Status: DC

## 2016-01-17 NOTE — Telephone Encounter (Signed)
Cynthia Sims with [facility] [employer] stated he spoke with pt and she needs an order for One Touch Ultra Test Strips sent to [facility]. Please advise. Thanks TNP

## 2016-01-17 NOTE — Telephone Encounter (Signed)
Patient advised that prescription is ready to be picked up at [facility]. sd

## 2016-01-24 ENCOUNTER — Encounter: Payer: PPO | Attending: Nurse Practitioner | Admitting: Dietician

## 2016-01-24 VITALS — Ht 61.0 in | Wt 186.9 lb

## 2016-01-24 DIAGNOSIS — R740 Nonspecific elevation of levels of transaminase and lactic acid dehydrogenase [LDH]: Secondary | ICD-10-CM | POA: Diagnosis not present

## 2016-01-24 DIAGNOSIS — E1100 Type 2 diabetes mellitus with hyperosmolarity without nonketotic hyperglycemic-hyperosmolar coma (NKHHC): Secondary | ICD-10-CM | POA: Insufficient documentation

## 2016-01-24 DIAGNOSIS — E118 Type 2 diabetes mellitus with unspecified complications: Secondary | ICD-10-CM

## 2016-01-24 NOTE — Patient Instructions (Signed)
   You can try a breakfast drink or protein drink for breakfast; keep the protein drink to once a day.   Include a protein food with your lunch, such as [place] and/or low fat cheese, chicken or tuna salad on a sandwich, or some beans. This will help the meal stay more filling, and you might not need an afternoon snack to prevent low blood sugar.   Keep up your exercises, and gradually build up your time and/or difficulty (intensity).

## 2016-01-24 NOTE — Progress Notes (Signed)
Medical Nutrition Therapy: Visit start time: 0930  end time: 1000  Assessment:  Diagnosis: Diabetes, elevated liver enzymes Medical history changes: Patient reports liver enzymes have improved. Psychosocial issues/ stress concerns: none  Current weight: 186.9lbs  Height: 5'1" Medications, supplement changes: no changes  Progress and evaluation: Patient reports achieving goals set at previous visit, and has lost just under 2lbs. She has further reduced carbohydrate intake. She continues to have decreased appetite, but is eating regularly to prevent low blood sugar.  She reports fasting BG of 200 today but does not know why it was elevated; usually fasting BGs are closer to 100mg /dl.  Physical activity: some chair exercises, duration and frequency unknown  Dietary Intake:  Usual eating pattern includes 3 meals and 2-3 snacks per day. Dining out frequency: not assessed today  Breakfast: 2 boiled eggs today, often raisin bran Snack: graham crackers with peanut butter, or cookie Lunch: sometimes just tomatoes, no bread. Snack: sometimes popcorn, or graham crackers Supper: chicken or other meat, vegetables including some starchy veg such as beans, corn, sometimes bread.  Snack: fruit such as fresh pineapple Beverages: water, occasional unsweetened tea or diet soda.   Nutrition Care Education: Topics covered: Diabetes, weight management Basic nutrition: appropriate nutrient balance, appropriate meal and snack schedule    Weight control: reviewed strategies for portion control, meal replacement options (patient question),  benefits of keeping food diary and tracking food intake, benefits of regular exercise,  Diabetes: appropriate meal and snack schedule, appropriate carb intake and balance  Nutritional Diagnosis:  -3.3 Overweight/obesity As related to history of excess calories, inactivity.  As evidenced by patient report.  Intervention: Discussion as noted above.   Commended patient for  efforts towards goals.    Advised a protein source with lunch, and including snacks only when needed.    Patient declined further follow-up at this time, but will call, schedule later if needed.  Education Materials given:  Marland Kitchen Goals/ instructions  Learner/ who was taught:  . Patient   Level of understanding: Marland Kitchen Verbalizes/ demonstrates competency  Demonstrated degree of understanding via:   Teach back Learning barriers: . None  Willingness to learn/ readiness for change: . Eager, change in progress  Monitoring and Evaluation:  Dietary intake, exercise, BG control, and body weight      follow up: prn

## 2016-02-07 ENCOUNTER — Encounter: Payer: Self-pay | Admitting: Family Medicine

## 2016-02-09 ENCOUNTER — Other Ambulatory Visit: Payer: Self-pay | Admitting: Family Medicine

## 2016-02-09 DIAGNOSIS — E1142 Type 2 diabetes mellitus with diabetic polyneuropathy: Secondary | ICD-10-CM

## 2016-04-15 ENCOUNTER — Other Ambulatory Visit: Payer: Self-pay | Admitting: Family Medicine

## 2016-04-15 DIAGNOSIS — I10 Essential (primary) hypertension: Secondary | ICD-10-CM

## 2016-04-15 DIAGNOSIS — N183 Chronic kidney disease, stage 3 unspecified: Secondary | ICD-10-CM

## 2016-04-15 DIAGNOSIS — E1122 Type 2 diabetes mellitus with diabetic chronic kidney disease: Secondary | ICD-10-CM

## 2016-05-02 ENCOUNTER — Other Ambulatory Visit: Payer: Self-pay | Admitting: Family Medicine

## 2016-05-02 DIAGNOSIS — N183 Chronic kidney disease, stage 3 unspecified: Secondary | ICD-10-CM

## 2016-05-02 DIAGNOSIS — E1122 Type 2 diabetes mellitus with diabetic chronic kidney disease: Secondary | ICD-10-CM

## 2016-05-31 ENCOUNTER — Other Ambulatory Visit: Payer: Self-pay | Admitting: Family Medicine

## 2016-05-31 DIAGNOSIS — F411 Generalized anxiety disorder: Secondary | ICD-10-CM

## 2016-06-23 ENCOUNTER — Encounter: Payer: Self-pay | Admitting: Family Medicine

## 2016-06-23 ENCOUNTER — Ambulatory Visit (INDEPENDENT_AMBULATORY_CARE_PROVIDER_SITE_OTHER): Payer: Medicare HMO | Admitting: Family Medicine

## 2016-06-23 VITALS — BP 116/62 | HR 63 | Temp 98.3°F | Resp 16 | Wt 188.4 lb

## 2016-06-23 DIAGNOSIS — E782 Mixed hyperlipidemia: Secondary | ICD-10-CM

## 2016-06-23 DIAGNOSIS — E0842 Diabetes mellitus due to underlying condition with diabetic polyneuropathy: Secondary | ICD-10-CM | POA: Diagnosis not present

## 2016-06-23 DIAGNOSIS — N3000 Acute cystitis without hematuria: Secondary | ICD-10-CM

## 2016-06-23 DIAGNOSIS — N312 Flaccid neuropathic bladder, not elsewhere classified: Secondary | ICD-10-CM | POA: Diagnosis not present

## 2016-06-23 DIAGNOSIS — R1030 Lower abdominal pain, unspecified: Secondary | ICD-10-CM

## 2016-06-23 LAB — POCT URINALYSIS DIPSTICK
Bilirubin, UA: NEGATIVE
Glucose, UA: NEGATIVE
Ketones, UA: NEGATIVE
Nitrite, UA: POSITIVE
Protein, UA: NEGATIVE
Spec Grav, UA: 1.025
Urobilinogen, UA: 0.2
pH, UA: 5

## 2016-06-23 MED ORDER — SULFAMETHOXAZOLE-TRIMETHOPRIM 800-160 MG PO TABS: 1.0000 | ORAL_TABLET | Freq: Two times a day (BID) | ORAL | 0 refills | Status: DC

## 2016-06-23 NOTE — Progress Notes (Signed)
[name] Female    DOB: 02/01/1951   66 y.o.   MRN: [id number] Visit Date: 06/23/2016  Today's Provider: [name], PA   Chief Complaint  Patient presents with  . Hyperlipidemia  . Hypertension  . Diabetes  . Follow-up   Subjective:    HPI  Diabetes Mellitus Type II, Follow-up:   Lab Results  Component Value Date   HGBA1C 7.3 08/16/2015   HGBA1C 7.2 04/12/2015   HGBA1C 7.2 (H) 01/14/2015   Last seen for diabetes 6 months ago.  Management since then includes continue medication. She reports good compliance with treatment. She is not having side effects.  Current symptoms include none and have been stable. Home blood sugar records: fasting range: 110 this morning, has been up to 200 at times  Episodes of hypoglycemia? no   Current Insulin Regimen: none Weight trend: stable Current diet: diabetic Current exercise: workout in house  ------------------------------------------------------------------------   Hypertension, follow-up:  BP Readings from Last 3 Encounters:  06/23/16 116/62  01/06/16 136/66  11/16/15 110/68    She was last seen for hypertension 6 months ago.  BP at that visit was 110/68. Management since that visit includes continue medication.She reports good compliance with treatment. She is not having side effects.  She is exercising. She is adherent to low salt diet.   Outside blood pressures are not being checked. She is experiencing none.  Patient denies chest pain, chest pressure/discomfort, irregular heart beat and palpitations.   Cardiovascular risk factors include advanced age (older than 54 for men, 39 for women), diabetes mellitus, dyslipidemia and obesity (BMI >= 30 kg/m2).  Use of agents associated with hypertension: none.   ------------------------------------------------------------------------    Lipid/Cholesterol, Follow-up:   Last seen for this 6 months ago.  Management since that visit includes  continue medication.  Last Lipid Panel:    Component Value Date/Time   CHOL 143 08/16/2015 1018   TRIG 141 08/16/2015 1018   HDL 49 08/16/2015 1018   CHOLHDL 2.9 08/16/2015 1018   LDLCALC 66 08/16/2015 1018    She reports good compliance with treatment. She is not having side effects.   Wt Readings from Last 3 Encounters:  06/23/16 188 lb 6.4 oz (85.5 kg)  01/24/16 186 lb 14.4 oz (84.8 kg)  01/06/16 184 lb (83.5 kg)    ------------------------------------------------------------------------ Past Medical History:  Diagnosis Date  . ALT (SGPT) level raised   . Anemia   . Anxiety   . Arthritis   . Diabetes mellitus without complication ([place])   . Elevated cholesterol   . GERD (gastroesophageal reflux disease)   . Hemorrhoids   . Hypertension   . Kidney disease    Past Surgical History:  Procedure Laterality Date  . CESAREAN SECTION  1983  . CESAREAN SECTION    . CHOLECYSTECTOMY    . COLONOSCOPY WITH PROPOFOL N/A 10/29/2015   Procedure: COLONOSCOPY WITH PROPOFOL;  Surgeon: Manya Silvas, MD;  Location: [facility] ENDOSCOPY;  Service: Endoscopy;  Laterality: N/A;  . DILATION AND CURETTAGE OF UTERUS  2001  . ESOPHAGOGASTRODUODENOSCOPY (EGD) WITH PROPOFOL N/A 10/29/2015   Procedure: ESOPHAGOGASTRODUODENOSCOPY (EGD) WITH PROPOFOL;  Surgeon: Manya Silvas, MD;  Location: [facility] ENDOSCOPY;  Service: Endoscopy;  Laterality: N/A;   Family History  Problem Relation Age of Onset  . Alzheimer's disease Mother   . COPD Mother   . Breast cancer Mother 63  . Osteoporosis Mother   . Heart attack Father   . Lupus Father   .  Diabetes Father   . Hypertension Father   . Diabetes Sister   . COPD Maternal Grandmother   . Diabetes Paternal Grandfather   . Diabetes Sister   . Diabetes Sister    No Known Allergies   Previous Medications   ASPIRIN EC 81 MG TABLET    Take by mouth.   CITALOPRAM (CELEXA) 20 MG TABLET    TAKE ONE TABLET BY MOUTH AT BEDTIME   CRANBERRY 125 MG TABS     Take 1 tablet by mouth daily.   FERROUS SULFATE 325 (65 FE) MG TABLET    Take 1 tablet by mouth daily.   GLIPIZIDE (GLUCOTROL) 10 MG TABLET    TAKE ONE TABLET BY MOUTH TWICE DAILY   GLUCOSE BLOOD TEST STRIP    Use as instructed to check fasting blood sugar once daily.   HYDROCODONE-ACETAMINOPHEN (NORCO/VICODIN) 5-325 MG TABLET    Take 1 tablet by mouth every 4 (four) hours as needed.   LANCETS (ONETOUCH ULTRASOFT) LANCETS    Use as instructed to test fasting blood sugar daily.   LISINOPRIL-HYDROCHLOROTHIAZIDE (PRINZIDE,ZESTORETIC) 10-12.5 MG TABLET    TAKE ONE TABLET BY MOUTH ONCE DAILY   LOVASTATIN (MEVACOR) 40 MG TABLET    TAKE ONE TABLET BY MOUTH ONCE DAILY   METFORMIN (GLUCOPHAGE) 500 MG TABLET    TAKE ONE TABLET BY MOUTH TWICE DAILY WITH A MEAL   OMEPRAZOLE (PRILOSEC) 40 MG CAPSULE   Take 40 mg by mouth each morning.    RANITIDINE (ZANTAC) 150 MG TABLET    Take 150 mg by mouth at bedtime.    Review of Systems  Constitutional: Negative.   Respiratory: Negative.   Cardiovascular: Negative.   Gastrointestinal: Positive for abdominal pain.  Endocrine: Negative.   Genitourinary:       Cloudy urine   Musculoskeletal: Negative.     Social History  Substance Use Topics  . Smoking status: Never Smoker  . Smokeless tobacco: Never Used  . Alcohol use No   Objective:   BP 116/62 (BP Location: Right Arm, Patient Position: Sitting, Cuff Size: Normal)   Pulse 63   Temp 98.3 F (36.8 C) (Oral)   Resp 16   Wt 188 lb 6.4 oz (85.5 kg)   SpO2 99%   BMI 35.60 kg/m   Physical Exam  Constitutional: She is oriented to person, place, and time. She appears well-developed and well-nourished. No distress.  HENT:  Head: Normocephalic and atraumatic.  Right Ear: Hearing and external ear normal.  Left Ear: Hearing and external ear normal.  Nose: Nose normal.  Mouth/Throat: Oropharynx is clear and moist.  Eyes: Conjunctivae and lids are normal. Right eye exhibits no discharge. Left eye exhibits  no discharge. No scleral icterus.  Neck: Neck supple.  Cardiovascular: Normal rate and regular rhythm.   Pulmonary/Chest: Effort normal and breath sounds normal. No respiratory distress.  Abdominal: Soft. Bowel sounds are normal. There is tenderness.  Some suprapubic discomfort.  Musculoskeletal: Normal range of motion.  Lymphadenopathy:    She has no cervical adenopathy.  Neurological: She is alert and oriented to person, place, and time.  Skin: Skin is intact. No lesion and no rash noted.  Psychiatric: She has a normal mood and affect. Her speech is normal and behavior is normal. Thought content normal.      Assessment & Plan:    1. Acute cystitis without hematuria Cloudy urine with lower abdominal pain over the past 2 weeks. No fever or hematuria. 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 Urinalysis showed heavy pyuria. Will get CBC, urine C&S and start antibiotic. Increase fluid intake and may use AZO-Standard prn discomfort. Recheck pending lab reports. - CBC with Differential/Platelet - sulfamethoxazole-trimethoprim (BACTRIM DS,SEPTRA DS) 800-160 MG tablet; Take 1 tablet by mouth 2 (two) times daily.  Dispense: 14 tablet; Refill: 0 - CULTURE, URINE COMPREHENSIVE  2. Lower abdominal pain Onset over the past 2 weeks with cloudy urine. History of recurrent UTI's. Urinalysis showed a great deal of pyuria. C&S pending. - POCT Urinalysis Dipstick  3. Diabetes mellitus due to underlying condition with diabetic polyneuropathy, without long-term current use of insulin (HCC) Continues to take Glipizide 10 mg BID and Metformin 500 mg BID. Denies hypoglycemic episodes. FBS 110 this morning. Has had some elevations over the holidays. Last Hgb A1C 7.3 in March 2017. Recommend ophthalmology exam and will get routine labs. Unable to get micro-albumen due to UTI today. 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 Check CMP to evaluate renal function. - Comprehensive metabolic panel  5. Combined fat and carbohydrate induced hyperlipemia Tolerating Lovastatin 40 mg qd without side effects. Continue low fat diabetic diet and restart exercise program. Recheck labs and follow up pending reports. - Comprehensive metabolic panel - Lipid panel - TSH

## 2016-06-24 LAB — HEMOGLOBIN A1C
Est. average glucose Bld gHb Est-mCnc: 166 mg/dL
Hgb A1c MFr Bld: 7.4 % — ABNORMAL HIGH (ref 4.8–5.6)

## 2016-06-24 LAB — COMPREHENSIVE METABOLIC PANEL
ALT: 33 IU/L — ABNORMAL HIGH (ref 0–32)
AST: 42 IU/L — ABNORMAL HIGH (ref 0–40)
Albumin/Globulin Ratio: 1.6 (ref 1.2–2.2)
Albumin: 4.2 g/dL (ref 3.6–4.8)
Alkaline Phosphatase: 105 IU/L (ref 39–117)
BUN/Creatinine Ratio: 18 (ref 12–28)
BUN: 23 mg/dL (ref 8–27)
Bilirubin Total: 0.3 mg/dL (ref 0.0–1.2)
CO2: 14 mmol/L — ABNORMAL LOW (ref 18–29)
Calcium: 8.5 mg/dL — ABNORMAL LOW (ref 8.7–10.3)
Chloride: 105 mmol/L (ref 96–106)
Creatinine, Ser: 1.28 mg/dL — ABNORMAL HIGH (ref 0.57–1.00)
GFR calc Af Amer: 50 mL/min/{1.73_m2} — ABNORMAL LOW (ref >59)
GFR calc non Af Amer: 43 mL/min/{1.73_m2} — ABNORMAL LOW (ref >59)
Globulin, Total: 2.6 g/dL (ref 1.5–4.5)
Glucose: 109 mg/dL — ABNORMAL HIGH (ref 65–99)
Potassium: 5.2 mmol/L (ref 3.5–5.2)
Sodium: 139 mmol/L (ref 134–144)
Total Protein: 6.8 g/dL (ref 6.0–8.5)

## 2016-06-24 LAB — CBC WITH DIFFERENTIAL/PLATELET
Basophils Absolute: 0 10*3/uL (ref 0.0–0.2)
Basos: 0 %
EOS (ABSOLUTE): 0.1 10*3/uL (ref 0.0–0.4)
Eos: 1 %
Hematocrit: 35.2 % (ref 34.0–46.6)
Hemoglobin: 11.8 g/dL (ref 11.1–15.9)
Immature Grans (Abs): 0 10*3/uL (ref 0.0–0.1)
Immature Granulocytes: 0 %
Lymphocytes Absolute: 2.3 10*3/uL (ref 0.7–3.1)
Lymphs: 33 %
MCH: 30.6 pg (ref 26.6–33.0)
MCHC: 33.5 g/dL (ref 31.5–35.7)
MCV: 91 fL (ref 79–97)
Monocytes Absolute: 0.4 10*3/uL (ref 0.1–0.9)
Monocytes: 6 %
Neutrophils Absolute: 4.1 10*3/uL (ref 1.4–7.0)
Neutrophils: 60 %
Platelets: 143 10*3/uL — ABNORMAL LOW (ref 150–379)
RBC: 3.85 x10E6/uL (ref 3.77–5.28)
RDW: 14.7 % (ref 12.3–15.4)
WBC: 7 10*3/uL (ref 3.4–10.8)

## 2016-06-24 LAB — LIPID PANEL
Chol/HDL Ratio: 2.5 {ratio} (ref 0.0–4.4)
Cholesterol, Total: 139 mg/dL (ref 100–199)
HDL: 56 mg/dL (ref >39)
LDL Calculated: 58 mg/dL (ref 0–99)
Triglycerides: 127 mg/dL (ref 0–149)
VLDL Cholesterol Cal: 25 mg/dL (ref 5–40)

## 2016-06-24 LAB — TSH: TSH: 0.901 u[IU]/mL (ref 0.450–4.500)

## 2016-06-26 ENCOUNTER — Telehealth: Payer: Self-pay

## 2016-06-26 LAB — CULTURE, URINE COMPREHENSIVE

## 2016-06-26 NOTE — Telephone Encounter (Signed)
Pt advised-aa 

## 2016-06-26 NOTE — Telephone Encounter (Signed)
-----   Message from Margo Common, [place] sent at 06/26/2016  8:02 AM EST ----- Culture isolated klebsiella bacteria. Awaiting final report on antibiotic sensitivity. Glucose and Hgb A1C still a little elevated. Kidney function shows strain of the UTI. Liver enzymes are slightly elevated. Need to drink more water and watch diet closely. Walking daily for exercise should help, also. Continue present medications and schedule follow up appointment in 3 months.

## 2016-06-26 NOTE — Telephone Encounter (Signed)
[id number] 

## 2016-07-20 ENCOUNTER — Encounter: Payer: Self-pay | Admitting: Emergency Medicine

## 2016-07-20 ENCOUNTER — Emergency Department
Admission: EM | Admit: 2016-07-20 | Discharge: 2016-07-20 | Disposition: A | Discharge status: Home / Self Care | Payer: Medicare HMO | Attending: Emergency Medicine | Admitting: Emergency Medicine

## 2016-07-20 DIAGNOSIS — I129 Hypertensive chronic kidney disease with stage 1 through stage 4 chronic kidney disease, or unspecified chronic kidney disease: Secondary | ICD-10-CM | POA: Diagnosis not present

## 2016-07-20 DIAGNOSIS — N183 Chronic kidney disease, stage 3 (moderate): Secondary | ICD-10-CM | POA: Diagnosis not present

## 2016-07-20 DIAGNOSIS — Z7984 Long term (current) use of oral hypoglycemic drugs: Secondary | ICD-10-CM | POA: Diagnosis not present

## 2016-07-20 DIAGNOSIS — Y939 Activity, unspecified: Secondary | ICD-10-CM | POA: Diagnosis not present

## 2016-07-20 DIAGNOSIS — E1122 Type 2 diabetes mellitus with diabetic chronic kidney disease: Secondary | ICD-10-CM | POA: Insufficient documentation

## 2016-07-20 DIAGNOSIS — M545 Low back pain: Secondary | ICD-10-CM | POA: Insufficient documentation

## 2016-07-20 DIAGNOSIS — Y929 Unspecified place or not applicable: Secondary | ICD-10-CM | POA: Diagnosis not present

## 2016-07-20 DIAGNOSIS — Y999 Unspecified external cause status: Secondary | ICD-10-CM | POA: Insufficient documentation

## 2016-07-20 DIAGNOSIS — Z7982 Long term (current) use of aspirin: Secondary | ICD-10-CM | POA: Insufficient documentation

## 2016-07-20 DIAGNOSIS — S3992XA Unspecified injury of lower back, initial encounter: Secondary | ICD-10-CM | POA: Diagnosis present

## 2016-07-20 MED ORDER — CYCLOBENZAPRINE HCL 5 MG PO TABS: 5.0000 mg | ORAL_TABLET | Freq: Three times a day (TID) | ORAL | 0 refills | Status: AC | PRN
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
## 2016-08-15 ENCOUNTER — Other Ambulatory Visit: Payer: Self-pay

## 2016-08-15 DIAGNOSIS — E1122 Type 2 diabetes mellitus with diabetic chronic kidney disease: Secondary | ICD-10-CM

## 2016-08-15 DIAGNOSIS — N183 Chronic kidney disease, stage 3 unspecified: Secondary | ICD-10-CM

## 2016-08-15 DIAGNOSIS — I10 Essential (primary) hypertension: Secondary | ICD-10-CM

## 2016-08-15 MED ORDER — LISINOPRIL-HYDROCHLOROTHIAZIDE 10-12.5 MG PO TABS: 1.0000 | ORAL_TABLET | Freq: Every day | ORAL | 3 refills | Status: DC

## 2016-08-15 MED ORDER — GLIPIZIDE 10 MG PO TABS: 10.0000 mg | ORAL_TABLET | Freq: Two times a day (BID) | ORAL | 3 refills | Status: DC

## 2016-08-15 NOTE — Telephone Encounter (Signed)
Refill request received from [facility] requesting a 90 day supply on glipiZIDE (GLUCOTROL) 10 MG tablet and lisinopril-hydrochlorothiazide (PRINZIDE,ZESTORETIC) 10-12.5 MG tablet

## 2016-08-26 ENCOUNTER — Other Ambulatory Visit: Payer: Self-pay | Admitting: Family Medicine

## 2016-08-26 DIAGNOSIS — E1142 Type 2 diabetes mellitus with diabetic polyneuropathy: Secondary | ICD-10-CM

## 2016-08-30 DIAGNOSIS — R339 Retention of urine, unspecified: Secondary | ICD-10-CM | POA: Diagnosis not present

## 2016-08-30 DIAGNOSIS — N390 Urinary tract infection, site not specified: Secondary | ICD-10-CM | POA: Diagnosis not present

## 2016-08-30 DIAGNOSIS — N319 Neuromuscular dysfunction of bladder, unspecified: Secondary | ICD-10-CM | POA: Diagnosis not present

## 2016-08-30 DIAGNOSIS — N312 Flaccid neuropathic bladder, not elsewhere classified: Secondary | ICD-10-CM | POA: Diagnosis not present

## 2016-09-04 DIAGNOSIS — L851 Acquired keratosis [keratoderma] palmaris et plantaris: Secondary | ICD-10-CM | POA: Diagnosis not present

## 2016-09-04 DIAGNOSIS — E1140 Type 2 diabetes mellitus with diabetic neuropathy, unspecified: Secondary | ICD-10-CM | POA: Diagnosis not present

## 2016-09-04 DIAGNOSIS — B351 Tinea unguium: Secondary | ICD-10-CM | POA: Diagnosis not present

## 2016-10-05 DIAGNOSIS — R69 Illness, unspecified: Secondary | ICD-10-CM | POA: Diagnosis not present

## 2016-10-24 ENCOUNTER — Encounter: Payer: Self-pay | Admitting: Family Medicine

## 2016-10-24 ENCOUNTER — Other Ambulatory Visit: Payer: Self-pay | Admitting: Family Medicine

## 2016-10-24 ENCOUNTER — Ambulatory Visit (INDEPENDENT_AMBULATORY_CARE_PROVIDER_SITE_OTHER): Payer: Medicare HMO | Admitting: Family Medicine

## 2016-10-24 VITALS — BP 108/60 | HR 88 | Temp 97.7°F | Resp 16 | Wt 185.0 lb

## 2016-10-24 DIAGNOSIS — N312 Flaccid neuropathic bladder, not elsewhere classified: Secondary | ICD-10-CM

## 2016-10-24 DIAGNOSIS — N3090 Cystitis, unspecified without hematuria: Secondary | ICD-10-CM

## 2016-10-24 DIAGNOSIS — E0842 Diabetes mellitus due to underlying condition with diabetic polyneuropathy: Secondary | ICD-10-CM | POA: Diagnosis not present

## 2016-10-24 LAB — POCT URINALYSIS DIPSTICK
Glucose, UA: NEGATIVE
Ketones, UA: NEGATIVE
Nitrite, UA: NEGATIVE
Spec Grav, UA: 1.02 (ref 1.010–1.025)
Urobilinogen, UA: 0.2 E.U./dL
pH, UA: 6 (ref 5.0–8.0)

## 2016-10-24 MED ORDER — CEFDINIR 300 MG PO CAPS: 300.0000 mg | ORAL_CAPSULE | Freq: Two times a day (BID) | ORAL | 0 refills | Status: DC

## 2016-10-24 NOTE — Progress Notes (Signed)
[name] Female    DOB: 03-Aug-1950   66 y.o.   MRN: [id number] Visit Date: 10/24/2016  Today's Provider: [name], PA   Chief Complaint  Patient presents with  . Urinary Tract Infection   Subjective:    Urinary Tract Infection   This is a new problem. The current episode started yesterday. The problem has been gradually worsening. The pain is mild. There has been no fever. Associated symptoms include chills, flank pain (left), frequency, nausea, sweats and urgency. Pertinent negatives include no discharge, hematuria, hesitancy, possible pregnancy or vomiting. She has tried nothing for the symptoms. Her past medical history is significant for catheterization.   Pt's last urine culture was positive for klebsiella on 06/23/2016, and was treated with Bactrim.  Past Medical History:  Diagnosis Date  . ALT (SGPT) level raised   . Anemia   . Anxiety   . Arthritis   . Diabetes mellitus without complication ([place])   . Elevated cholesterol   . GERD (gastroesophageal reflux disease)   . Hemorrhoids   . Hypertension   . Kidney disease    Patient Active Problem List   Diagnosis Date Noted  . Can't get food down 09/13/2015  . ALT (SGPT) level raised 09/13/2015  . Acid reflux 09/13/2015  . H/O adenomatous polyp of colon 09/13/2015  . Anxiety 01/14/2015  . Hypertension 01/14/2015  . Chronic kidney disease (CKD), stage III (moderate) 01/13/2015  . Cannot sleep 02/26/2009  . Anxiety state 02/26/2009  . 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 Diabetes ([place]) 12/14/2005  . 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 Menopausal symptom 12/14/2005   Past Surgical History:  Procedure Laterality Date  . CESAREAN SECTION  1983  . CESAREAN SECTION    . CHOLECYSTECTOMY    . COLONOSCOPY WITH PROPOFOL N/A 10/29/2015   Procedure:  COLONOSCOPY WITH PROPOFOL;  Surgeon: Manya Silvas, MD;  Location[facility] ENDOSCOPY;  Service: Endoscopy;  Laterality: N/A;  . DILATION AND CURETTAGE OF UTERUS  2001  . ESOPHAGOGASTRODUODENOSCOPY (EGD) WITH PROPOFOL N/A 10/29/2015   Procedure: ESOPHAGOGASTRODUODENOSCOPY (EGD) WITH PROPOFOL;  Surgeon: Manya Silvas, MD;  Location: [facility] ENDOSCOPY;  Service: Endoscopy;  Laterality: N/A;   Family History  Problem Relation Age of Onset  . Alzheimer's disease Mother   . COPD Mother   . Breast cancer Mother 83  . Osteoporosis Mother   . Heart attack Father   . Lupus Father   . Diabetes Father   . Hypertension Father   . Diabetes Sister   . COPD Maternal Grandmother   . Diabetes Paternal Grandfather   . Diabetes Sister   . Diabetes Sister    No Known Allergies  Current Outpatient Prescriptions:  .  aspirin EC 81 MG tablet, Take by mouth., Disp: , Rfl:  .  citalopram (CELEXA) 20 MG tablet, TAKE ONE TABLET BY MOUTH AT BEDTIME, Disp: 30 tablet, Rfl: 6 .  Cranberry 125 MG TABS, Take 1 tablet by mouth daily., Disp: , Rfl:  .  ferrous sulfate 325 (65 FE) MG tablet, Take 1 tablet by mouth daily., Disp: , Rfl:  .  glipiZIDE (GLUCOTROL) 10 MG tablet, Take 1 tablet (10 mg total) by mouth 2 (two) times daily., Disp: 180 tablet, Rfl: 3 .  glucose blood test strip, Use as instructed to check fasting blood sugar once daily.,  Disp: 100 each, Rfl: 12 .  HYDROcodone-acetaminophen (NORCO/VICODIN) 5-325 MG tablet, Take 1 tablet by mouth every 4 (four) hours as needed., Disp: 10 tablet, Rfl: 0 .  Lancets (ONETOUCH ULTRASOFT) lancets, Use as instructed to test fasting blood sugar daily., Disp: 100 each, Rfl: 12 .  lisinopril-hydrochlorothiazide (PRINZIDE,ZESTORETIC) 10-12.5 MG tablet, Take 1 tablet by mouth daily., Disp: 90 tablet, Rfl: 3 .  lovastatin (MEVACOR) 40 MG tablet, TAKE ONE TABLET BY MOUTH ONCE DAILY, Disp: 90 tablet, Rfl: 3 .  metFORMIN (GLUCOPHAGE) 500 MG tablet, TAKE ONE TABLET BY MOUTH TWICE  DAILY WITH A MEAL, Disp: 60 tablet, Rfl: 3 .  omeprazole (PRILOSEC) 40 MG capsule, , Disp: , Rfl:  .  ranitidine (ZANTAC) 150 MG tablet, Take 150 mg by mouth at bedtime., Disp: , Rfl:   Review of Systems  Constitutional: Positive for chills.  Gastrointestinal: Positive for nausea. Negative for vomiting.  Genitourinary: Positive for flank pain (left), frequency and urgency. Negative for hematuria and hesitancy.    Social History  Substance Use Topics  . Smoking status: Never Smoker  . Smokeless tobacco: Never Used  . Alcohol use No   Objective:   BP 108/60 (BP Location: Right Arm, Patient Position: Sitting, Cuff Size: Large)   Pulse 88   Temp 97.7 F (36.5 C) (Oral)   Resp 16   Wt 185 lb (83.9 kg)   BMI 34.96 kg/m  Vitals:   10/24/16 1042  BP: 108/60  Pulse: 88  Resp: 16  Temp: 97.7 F (36.5 C)  TempSrc: Oral  Weight: 185 lb (83.9 kg)   Physical Exam  Constitutional: She is oriented to person, place, and time. She appears well-developed and well-nourished.  HENT:  Head: Normocephalic.  Eyes: Conjunctivae are normal.  Cardiovascular: Normal rate and regular rhythm.   Pulmonary/Chest: Effort normal.  Abdominal: Soft.  Bladder pressure and right flank ache/pain.  Musculoskeletal:  Unchanged peripheral neuropathy pains and numbness in feet. Calluses followed by podiatrist.  Lymphadenopathy:    She has no cervical adenopathy.  Neurological: She is alert and oriented to person, place, and time.      Assessment & Plan:     1. Cystitis Developed urinary frequency with urgency and bladder/flank discomfort yesterday. No hematuria. Urinalysis showed 4++ bacteria and TNTC WBC's per hpf. Will get follow up labs to assess CKD stage III and urine culture. Increase fluid intake. Given antibiotic and start C&S. - POCT urinalysis dipstick - CBC with Differential/Platelet - Urine culture - cefdinir (OMNICEF) 300 MG capsule; Take 1 capsule (300 mg total) by mouth 2 (two) times  daily.  Dispense: 14 capsule; Refill: 0  2. Diabetes mellitus due to underlying condition with diabetic polyneuropathy, without long-term current use of insulin (HCC) FBS around 114-140 at home. Still trying to follow diabetic diet and take Glipizide 10 mg BID with Metformin 500 mg BID. Last Hgb A1C was 7.4 in January 2018. Sees eye doctor annually and podiatrist every 3 months for persistent peripheral neuropathy. Recheck labs and follow up pending reports. 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       [name], PA  [facility]

## 2016-10-25 DIAGNOSIS — E0842 Diabetes mellitus due to underlying condition with diabetic polyneuropathy: Secondary | ICD-10-CM | POA: Diagnosis not present

## 2016-10-25 DIAGNOSIS — N3090 Cystitis, unspecified without hematuria: Secondary | ICD-10-CM | POA: Diagnosis not present

## 2016-10-26 ENCOUNTER — Telehealth: Payer: Self-pay

## 2016-10-26 LAB — COMPREHENSIVE METABOLIC PANEL
ALT: 17 [IU]/L (ref 0–32)
AST: 21 IU/L (ref 0–40)
Albumin/Globulin Ratio: 1.6 (ref 1.2–2.2)
Albumin: 4.1 g/dL (ref 3.6–4.8)
Alkaline Phosphatase: 83 IU/L (ref 39–117)
BUN/Creatinine Ratio: 16 (ref 12–28)
BUN: 24 mg/dL (ref 8–27)
Bilirubin Total: 0.3 mg/dL (ref 0.0–1.2)
CO2: 17 mmol/L — ABNORMAL LOW (ref 18–29)
Calcium: 8.5 mg/dL — ABNORMAL LOW (ref 8.7–10.3)
Chloride: 106 mmol/L (ref 96–106)
Creatinine, Ser: 1.52 mg/dL — ABNORMAL HIGH (ref 0.57–1.00)
GFR calc Af Amer: 40 mL/min/{1.73_m2} — ABNORMAL LOW (ref >59)
GFR calc non Af Amer: 35 mL/min/{1.73_m2} — ABNORMAL LOW (ref >59)
Globulin, Total: 2.6 g/dL (ref 1.5–4.5)
Glucose: 99 mg/dL (ref 65–99)
Potassium: 4.9 mmol/L (ref 3.5–5.2)
Sodium: 140 mmol/L (ref 134–144)
Total Protein: 6.7 g/dL (ref 6.0–8.5)

## 2016-10-26 LAB — CBC WITH DIFFERENTIAL/PLATELET
Basophils Absolute: 0 10*3/uL (ref 0.0–0.2)
Basos: 0 %
EOS (ABSOLUTE): 0.1 10*3/uL (ref 0.0–0.4)
Eos: 1 %
Hematocrit: 34.3 % (ref 34.0–46.6)
Hemoglobin: 11.5 g/dL (ref 11.1–15.9)
Immature Grans (Abs): 0 10*3/uL (ref 0.0–0.1)
Immature Granulocytes: 0 %
Lymphocytes Absolute: 2.4 10*3/uL (ref 0.7–3.1)
Lymphs: 39 %
MCH: 30.6 pg (ref 26.6–33.0)
MCHC: 33.5 g/dL (ref 31.5–35.7)
MCV: 91 fL (ref 79–97)
Monocytes Absolute: 0.4 10*3/uL (ref 0.1–0.9)
Monocytes: 7 %
Neutrophils Absolute: 3.3 10*3/uL (ref 1.4–7.0)
Neutrophils: 53 %
Platelets: 150 10*3/uL (ref 150–379)
RBC: 3.76 x10E6/uL — ABNORMAL LOW (ref 3.77–5.28)
RDW: 13.5 % (ref 12.3–15.4)
WBC: 6.3 10*3/uL (ref 3.4–10.8)

## 2016-10-26 LAB — HEMOGLOBIN A1C
Est. average glucose Bld gHb Est-mCnc: 154 mg/dL
Hgb A1c MFr Bld: 7 % — ABNORMAL HIGH (ref 4.8–5.6)

## 2016-10-26 LAB — URINE CULTURE

## 2016-10-26 NOTE — Telephone Encounter (Signed)
-----   Message from Margo Common, [place] sent at 10/26/2016  1:45 PM EDT ----- Hgb A1C and glucose better. Kidney function tests show some increased stress from infection. Should recheck levels in 3 months.

## 2016-10-26 NOTE — Telephone Encounter (Signed)
Advised pt of lab results. Pt verbally acknowledges understanding. FU scheduled. Renaldo Fiddler, CMA

## 2016-10-26 NOTE — Telephone Encounter (Signed)
-----   Message from Margo Common, [place] sent at 10/26/2016  1:43 PM EDT ----- Urine culture isolated the same Klebsiella bacteria as the last time. Finish all the antibiotic given and recheck as needed.

## 2016-10-26 NOTE — Telephone Encounter (Signed)
Advised pt of lab results. Pt verbally acknowledges understanding. Lasundra Hascall Drozdowski, CMA   

## 2016-11-02 ENCOUNTER — Other Ambulatory Visit: Payer: Self-pay | Admitting: Family Medicine

## 2016-11-02 DIAGNOSIS — E1142 Type 2 diabetes mellitus with diabetic polyneuropathy: Secondary | ICD-10-CM

## 2016-11-02 MED ORDER — METFORMIN HCL 500 MG PO TABS: 500.0000 mg | ORAL_TABLET | Freq: Two times a day (BID) | ORAL | 3 refills | Status: DC

## 2016-11-20 ENCOUNTER — Other Ambulatory Visit: Payer: Self-pay | Admitting: Family Medicine

## 2016-11-20 DIAGNOSIS — R339 Retention of urine, unspecified: Secondary | ICD-10-CM | POA: Diagnosis not present

## 2016-11-20 DIAGNOSIS — N312 Flaccid neuropathic bladder, not elsewhere classified: Secondary | ICD-10-CM | POA: Diagnosis not present

## 2016-11-20 DIAGNOSIS — E1122 Type 2 diabetes mellitus with diabetic chronic kidney disease: Secondary | ICD-10-CM

## 2016-11-20 DIAGNOSIS — N390 Urinary tract infection, site not specified: Secondary | ICD-10-CM | POA: Diagnosis not present

## 2016-11-20 DIAGNOSIS — N183 Chronic kidney disease, stage 3 unspecified: Secondary | ICD-10-CM

## 2016-11-20 DIAGNOSIS — N319 Neuromuscular dysfunction of bladder, unspecified: Secondary | ICD-10-CM | POA: Diagnosis not present

## 2016-11-20 DIAGNOSIS — I10 Essential (primary) hypertension: Secondary | ICD-10-CM

## 2016-11-20 MED ORDER — GLIPIZIDE 10 MG PO TABS: 10.0000 mg | ORAL_TABLET | Freq: Two times a day (BID) | ORAL | 3 refills | Status: DC

## 2016-11-20 MED ORDER — LISINOPRIL-HYDROCHLOROTHIAZIDE 10-12.5 MG PO TABS: 1.0000 | ORAL_TABLET | Freq: Every day | ORAL | 3 refills | Status: DC

## 2016-11-20 NOTE — Telephone Encounter (Signed)
[facility] faxed a refill request on the following medications:  lisinopril-hydrochlorothiazide (PRINZIDE,ZESTORETIC) 10-12.5 MG tablet.  Take 1 tablet by mouth once daily  glipiZIDE (GLUCOTROL) 10 MG tablet.  Take 1 tablet by mouth twice daily.    90 day supply.  [address]/MW

## 2016-12-04 ENCOUNTER — Ambulatory Visit (INDEPENDENT_AMBULATORY_CARE_PROVIDER_SITE_OTHER): Payer: Medicare HMO | Admitting: Family Medicine

## 2016-12-04 ENCOUNTER — Encounter: Payer: Self-pay | Admitting: Family Medicine

## 2016-12-04 VITALS — BP 102/58 | HR 71 | Temp 98.5°F | Wt 181.6 lb

## 2016-12-04 DIAGNOSIS — N3000 Acute cystitis without hematuria: Secondary | ICD-10-CM | POA: Diagnosis not present

## 2016-12-04 DIAGNOSIS — N312 Flaccid neuropathic bladder, not elsewhere classified: Secondary | ICD-10-CM | POA: Diagnosis not present

## 2016-12-04 DIAGNOSIS — M545 Low back pain: Secondary | ICD-10-CM | POA: Diagnosis not present

## 2016-12-04 LAB — POCT URINALYSIS DIPSTICK
Bilirubin, UA: NEGATIVE
Glucose, UA: NEGATIVE
Ketones, UA: NEGATIVE
Nitrite, UA: POSITIVE
Protein, UA: NEGATIVE
Spec Grav, UA: >=1.03 — AB (ref 1.010–1.025)
Urobilinogen, UA: 0.2 E.U./dL
pH, UA: 5 (ref 5.0–8.0)

## 2016-12-04 MED ORDER — PHENAZOPYRIDINE HCL 200 MG PO TABS: 200.0000 mg | ORAL_TABLET | Freq: Three times a day (TID) | ORAL | 0 refills | Status: DC | PRN

## 2016-12-04 MED ORDER — CIPROFLOXACIN HCL 500 MG PO TABS: 500.0000 mg | ORAL_TABLET | Freq: Two times a day (BID) | ORAL | 0 refills | Status: DC

## 2016-12-04 NOTE — Patient Instructions (Signed)

## 2016-12-04 NOTE — Progress Notes (Addendum)
[name] Female    DOB: 1951-02-11   66 y.o.   MRN: [id number] Visit Date: 12/04/2016  Today's Provider: [name], PA   Chief Complaint  Patient presents with  . Back Pain   Subjective:    Back Pain  This is a new problem. Episode onset: Friday. The problem occurs constantly. The problem has been gradually worsening since onset. The pain is present in the lumbar spine. The quality of the pain is described as aching. (Patient reports when she catheterizes herself it is painful and the urine is cloudy since back pain started.) Risk factors: Atony of bladder and history of frequent UTI's  She has tried nothing for the symptoms.   Patient Active Problem List   Diagnosis Date Noted  . Can't get food down 09/13/2015  . ALT (SGPT) level raised 09/13/2015  . Acid reflux 09/13/2015  . H/O adenomatous polyp of colon 09/13/2015  . Anxiety 01/14/2015  . Hypertension 01/14/2015  . Chronic kidney disease (CKD), stage III (moderate) 01/13/2015  . Cannot sleep 02/26/2009  . Anxiety state 02/26/2009  . 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 Diabetes ([place]) 12/14/2005  . 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 Menopausal symptom 12/14/2005   Past Surgical History:  Procedure Laterality Date  . CESAREAN SECTION  1983  . CESAREAN SECTION    . CHOLECYSTECTOMY    . COLONOSCOPY WITH PROPOFOL N/A 10/29/2015   Procedure: COLONOSCOPY WITH PROPOFOL;  Surgeon: Manya Silvas, MD;  Location: [facility] ENDOSCOPY;  Service: Endoscopy;  Laterality: N/A;  . DILATION AND CURETTAGE OF UTERUS  2001  . ESOPHAGOGASTRODUODENOSCOPY (EGD) WITH PROPOFOL N/A 10/29/2015   Procedure: ESOPHAGOGASTRODUODENOSCOPY (EGD) WITH PROPOFOL;  Surgeon: Manya Silvas, MD;  Location: [facility] ENDOSCOPY;  Service: Endoscopy;  Laterality: N/A;   Family History  Problem Relation Age of  Onset  . Alzheimer's disease Mother   . COPD Mother   . Breast cancer Mother 56  . Osteoporosis Mother   . Heart attack Father   . Lupus Father   . Diabetes Father   . Hypertension Father   . Diabetes Sister   . COPD Maternal Grandmother   . Diabetes Paternal Grandfather   . Diabetes Sister   . Diabetes Sister    No Known Allergies   Previous Medications   ASPIRIN EC 81 MG TABLET    Take by mouth.   CITALOPRAM (CELEXA) 20 MG TABLET    TAKE ONE TABLET BY MOUTH AT BEDTIME   CRANBERRY 125 MG TABS    Take 1 tablet by mouth daily.   FERROUS SULFATE 325 (65 FE) MG TABLET    Take 1 tablet by mouth daily.   GLIPIZIDE (GLUCOTROL) 10 MG TABLET    Take 1 tablet (10 mg total) by mouth 2 (two) times daily.   GLUCOSE BLOOD TEST STRIP    Use as instructed to check fasting blood sugar once daily.   LANCETS (ONETOUCH ULTRASOFT) LANCETS    Use as instructed to test fasting blood sugar daily.   LISINOPRIL-HYDROCHLOROTHIAZIDE (PRINZIDE,ZESTORETIC) 10-12.5 MG TABLET    Take 1 tablet by mouth daily.   LOVASTATIN (MEVACOR) 40 MG TABLET    TAKE ONE TABLET BY MOUTH ONCE DAILY   METFORMIN (GLUCOPHAGE) 500 MG TABLET    Take 1 tablet (500 mg total) by mouth 2 (two) times daily with a meal.  OMEPRAZOLE (PRILOSEC) 40 MG CAPSULE       RANITIDINE (ZANTAC) 150 MG TABLET    Take 150 mg by mouth at bedtime.    Review of Systems  Constitutional: Negative.   Respiratory: Negative.   Cardiovascular: Negative.   Musculoskeletal: Positive for back pain.    Social History  Substance Use Topics  . Smoking status: Never Smoker  . Smokeless tobacco: Never Used  . Alcohol use No   Objective:   BP (!) 102/58 (BP Location: Right Arm, Patient Position: Sitting, Cuff Size: Normal)   Pulse 71   Temp 98.5 F (36.9 C) (Oral)   Wt 181 lb 9.6 oz (82.4 kg)   SpO2 98%   BMI 34.31 kg/m   Physical Exam  Constitutional: She is oriented to person, place, and time. She appears well-developed and well-nourished. No  distress.  HENT:  Head: Normocephalic and atraumatic.  Right Ear: Hearing normal.  Left Ear: Hearing normal.  Nose: Nose normal.  Eyes: Conjunctivae and lids are normal. Right eye exhibits no discharge. Left eye exhibits no discharge. No scleral icterus.  Neck: Neck supple.  Cardiovascular: Normal rate and regular rhythm.   Pulmonary/Chest: Effort normal and breath sounds normal. No respiratory distress.  Abdominal: Soft. Bowel sounds are normal. There is tenderness.  Suprapubic tenderness to palpation. Questionable posterior CVA tenderness to percussion.   Musculoskeletal: Normal range of motion.  Neurological: She is alert and oriented to person, place, and time.  Skin: Skin is intact. No lesion and no rash noted.  Psychiatric: She has a normal mood and affect. Her speech is normal and behavior is normal. Thought content normal.      Assessment & Plan:     1. Acute cystitis without hematuria Onset with urethral irritation with use of catheter and general malaise over the past 3 days. No fever or hematuria. Urinalysis shows positive nitrites and high specific gravity. Will get urine C&S and start antibiotic with Pyridium. Increase fluid intake and recheck pending lab report. - POCT Urinalysis Dipstick - Urine Culture - ciprofloxacin (CIPRO) 500 MG tablet; Take 1 tablet (500 mg total) by mouth 2 (two) times daily.  Dispense: 20 tablet; Refill: 0 - phenazopyridine (PYRIDIUM) 200 MG tablet; Take 1 tablet (200 mg total) by mouth 3 (three) times daily as needed for pain.  Dispense: 12 tablet; Refill: 0  2. Atony of bladder Diagnosed in 2007 and uses self-catheterization TID to help empty baladder Must use closed system kit catheters due to recurrent UTI's

## 2016-12-05 DIAGNOSIS — E1140 Type 2 diabetes mellitus with diabetic neuropathy, unspecified: Secondary | ICD-10-CM | POA: Diagnosis not present

## 2016-12-05 DIAGNOSIS — B351 Tinea unguium: Secondary | ICD-10-CM | POA: Diagnosis not present

## 2016-12-05 DIAGNOSIS — L851 Acquired keratosis [keratoderma] palmaris et plantaris: Secondary | ICD-10-CM | POA: Diagnosis not present

## 2016-12-07 LAB — URINE CULTURE

## 2017-01-01 DIAGNOSIS — R69 Illness, unspecified: Secondary | ICD-10-CM | POA: Diagnosis not present

## 2017-01-09 ENCOUNTER — Ambulatory Visit
Admission: RE | Admit: 2017-01-09 | Discharge: 2017-01-09 | Disposition: A | Discharge status: Home / Self Care | Payer: Medicare HMO | Source: Ambulatory Visit | Attending: Family Medicine | Admitting: Family Medicine

## 2017-01-09 ENCOUNTER — Ambulatory Visit (INDEPENDENT_AMBULATORY_CARE_PROVIDER_SITE_OTHER): Payer: Medicare HMO | Admitting: Family Medicine

## 2017-01-09 ENCOUNTER — Encounter: Payer: Self-pay | Admitting: Family Medicine

## 2017-01-09 VITALS — BP 118/78 | HR 73 | Temp 98.5°F | Wt 184.4 lb

## 2017-01-09 DIAGNOSIS — M5430 Sciatica, unspecified side: Secondary | ICD-10-CM

## 2017-01-09 DIAGNOSIS — M549 Dorsalgia, unspecified: Secondary | ICD-10-CM

## 2017-01-09 DIAGNOSIS — N390 Urinary tract infection, site not specified: Secondary | ICD-10-CM | POA: Diagnosis not present

## 2017-01-09 DIAGNOSIS — M545 Low back pain: Secondary | ICD-10-CM | POA: Insufficient documentation

## 2017-01-09 DIAGNOSIS — M79661 Pain in right lower leg: Secondary | ICD-10-CM | POA: Diagnosis not present

## 2017-01-09 DIAGNOSIS — N312 Flaccid neuropathic bladder, not elsewhere classified: Secondary | ICD-10-CM | POA: Diagnosis not present

## 2017-01-09 LAB — POCT URINALYSIS DIPSTICK
Bilirubin, UA: NEGATIVE
Glucose, UA: NEGATIVE
Ketones, UA: NEGATIVE
Nitrite, UA: POSITIVE
Protein, UA: NEGATIVE
Spec Grav, UA: 1.025 (ref 1.010–1.025)
Urobilinogen, UA: 0.2 E.U./dL
pH, UA: 6 (ref 5.0–8.0)

## 2017-01-09 MED ORDER — CEFDINIR 300 MG PO CAPS: 300.0000 mg | ORAL_CAPSULE | Freq: Two times a day (BID) | ORAL | 0 refills | Status: DC

## 2017-01-09 MED ORDER — METHOCARBAMOL 500 MG PO TABS: 500.0000 mg | ORAL_TABLET | Freq: Four times a day (QID) | ORAL | 0 refills | Status: DC

## 2017-01-09 NOTE — Progress Notes (Addendum)
[name] Female    DOB: 14-Jul-1950   66 y.o.   MRN: [id number] Visit Date: 01/09/2017  Today's Provider: [name], PA   Chief Complaint  Patient presents with  . Urinary Tract Infection   Subjective:    Urinary Tract Infection   This is a new problem. The current episode started yesterday. Associated symptoms comments: Cloudy urine and particles in urine. Patient notices when she caths herself. Leg pain . She has tried nothing for the symptoms. PMH includes atony of the bladder   Patient Active Problem List   Diagnosis Date Noted  . Can't get food down 09/13/2015  . ALT (SGPT) level raised 09/13/2015  . Acid reflux 09/13/2015  . H/O adenomatous polyp of colon 09/13/2015  . Anxiety 01/14/2015  . Hypertension 01/14/2015  . Chronic kidney disease (CKD), stage III (moderate) 01/13/2015  . Cannot sleep 02/26/2009  . Anxiety state 02/26/2009  . 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 Diabetes ([place]) 12/14/2005  . 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 Menopausal symptom 12/14/2005   Past Surgical History:  Procedure Laterality Date  . CESAREAN SECTION  1983  . CESAREAN SECTION    . CHOLECYSTECTOMY    . COLONOSCOPY WITH PROPOFOL N/A 10/29/2015   Procedure: COLONOSCOPY WITH PROPOFOL;  Surgeon: Manya Silvas, MD;  Location: [facility] ENDOSCOPY;  Service: Endoscopy;  Laterality: N/A;  . DILATION AND CURETTAGE OF UTERUS  2001  . ESOPHAGOGASTRODUODENOSCOPY (EGD) WITH PROPOFOL N/A 10/29/2015   Procedure: ESOPHAGOGASTRODUODENOSCOPY (EGD) WITH PROPOFOL;  Surgeon: Manya Silvas, MD;  Location: [facility] ENDOSCOPY;  Service: Endoscopy;  Laterality: N/A;   Family History  Problem Relation Age of Onset  . Alzheimer's disease Mother   . COPD Mother   . Breast cancer Mother 27  . Osteoporosis Mother   . Heart attack Father   . Lupus Father     . Diabetes Father   . Hypertension Father   . Diabetes Sister   . COPD Maternal Grandmother   . Diabetes Paternal Grandfather   . Diabetes Sister   . Diabetes Sister    No Known Allergies   Previous Medications   ASPIRIN EC 81 MG TABLET    Take by mouth.   CITALOPRAM (CELEXA) 20 MG TABLET    TAKE ONE TABLET BY MOUTH AT BEDTIME   CRANBERRY 125 MG TABS    Take 1 tablet by mouth daily.   FERROUS SULFATE 325 (65 FE) MG TABLET    Take 1 tablet by mouth daily.   GLIPIZIDE (GLUCOTROL) 10 MG TABLET    Take 1 tablet (10 mg total) by mouth 2 (two) times daily.   GLUCOSE BLOOD TEST STRIP    Use as instructed to check fasting blood sugar once daily.   LANCETS (ONETOUCH ULTRASOFT) LANCETS    Use as instructed to test fasting blood sugar daily.   LISINOPRIL-HYDROCHLOROTHIAZIDE (PRINZIDE,ZESTORETIC) 10-12.5 MG TABLET    Take 1 tablet by mouth daily.   LOVASTATIN (MEVACOR) 40 MG TABLET    TAKE ONE TABLET BY MOUTH ONCE DAILY   METFORMIN (GLUCOPHAGE) 500 MG TABLET    Take 1 tablet (500 mg total) by mouth 2 (two) times daily with a meal.   OMEPRAZOLE (PRILOSEC) 40 MG CAPSULE       RANITIDINE (ZANTAC) 150 MG TABLET    Take 150 mg by mouth at bedtime.  Review of Systems  Constitutional: Negative.   Respiratory: Negative.   Cardiovascular: Negative.   Genitourinary:       Cloudy urine   Musculoskeletal:       Leg pain    Social History  Substance Use Topics  . Smoking status: Never Smoker  . Smokeless tobacco: Never Used  . Alcohol use No   Objective:   BP 118/78 (BP Location: Right Arm, Patient Position: Sitting, Cuff Size: Normal)   Pulse 73   Temp 98.5 F (36.9 C) (Oral)   Wt 184 lb 6.4 oz (83.6 kg)   SpO2 99%   BMI 34.84 kg/m   Physical Exam  Constitutional: She is oriented to person, place, and time. She appears well-developed and well-nourished. No distress.  HENT:  Head: Normocephalic and atraumatic.  Right Ear: Hearing normal.  Left Ear: Hearing normal.  Nose: Nose  normal.  Eyes: Conjunctivae and lids are normal. Right eye exhibits no discharge. Left eye exhibits no discharge. No scleral icterus.  Neck: Neck supple.  Cardiovascular: Normal rate, regular rhythm and intact distal pulses.   Pulmonary/Chest: Effort normal and breath sounds normal. No respiratory distress.  Abdominal: Soft. Bowel sounds are normal.  Suprapubic discomfort to palpation. No CVA tenderness to percussion posteriorly.  Musculoskeletal: She exhibits tenderness.  Right calf tender to test Homan's sign. Many varicose veins without swelling. SLR's 90 degrees with pain in lower back, sacrum and right leg without numbness.  Neurological: She is alert and oriented to person, place, and time.  Skin: Skin is intact. No lesion and no rash noted.  Psychiatric: She has a normal mood and affect. Her speech is normal and behavior is normal. Thought content normal.      Assessment & Plan:     1. Recurrent UTI Notice cloudy urine yesterday similar to past UTI's. No fever or hematuria noted grossly. Urinalysis showed positive nitrites and leukocytes. Will get urine culture and start [place]. Increase fluid intake and recheck pending culture report. - POCT Urinalysis Dipstick - Urine Culture - cefdinir (OMNICEF) 300 MG capsule; Take 1 capsule (300 mg total) by mouth 2 (two) times daily.  Dispense: 20 capsule; Refill: 0  2. Atony of bladder Unchanged and still using self-catheterization TID to empty bladder. Using sterile kits each time. No pain or hematuria.  3. Back pain with sciatica Onset the past couple days without a specific injury. Has an aching pain that is sharp with certain movements in the right SI region radiating down the right leg. May try the Aleve or Advil with Robaxin for pain and spasms. Apply moist heat to back and get x-ray evaluation of L-S spine. - methocarbamol (ROBAXIN) 500 MG tablet; Take 1 tablet (500 mg total) by mouth 4 (four) times daily.  Dispense: 30 tablet;  Refill: 0 - DG Lumbar Spine Complete  4. Right calf pain Positive Homan's sign with many varicose veins. No significant swelling today. Will get doppler to rule out DVT. - US Venous Img Lower Unilateral Right  Received called report from x-ray department indicating no evidence of DVT in the right leg. Awaiting final L-S spine x-ray report. Will start antibiotic for UTI and Robaxin for the back and leg pain. May need prednisone taper pending x-ray report and urine culture report.

## 2017-01-09 NOTE — Patient Instructions (Signed)

## 2017-01-12 ENCOUNTER — Telehealth: Payer: Self-pay

## 2017-01-12 NOTE — Telephone Encounter (Signed)
[email or web address]

## 2017-01-12 NOTE — Telephone Encounter (Signed)
-----   Message from Margo Common, [place] sent at 01/12/2017  8:28 AM EDT ----- Preliminary culture report says lab is in the process of identifying the pathogen. Continue present medication.

## 2017-01-12 NOTE — Telephone Encounter (Signed)
Pt informed and voiced understanding of results. 

## 2017-01-12 NOTE — Telephone Encounter (Signed)
Pt is returning call.  JS#[phone]

## 2017-01-13 LAB — URINE CULTURE

## 2017-01-15 ENCOUNTER — Telehealth: Payer: Self-pay

## 2017-01-15 MED ORDER — CIPROFLOXACIN HCL 500 MG PO TABS: 500.0000 mg | ORAL_TABLET | Freq: Two times a day (BID) | ORAL | 0 refills | Status: DC

## 2017-01-15 NOTE — Telephone Encounter (Signed)
-----   Message from Margo Common, [place] sent at 01/15/2017 11:16 AM EDT ----- Final urine culture report isolated Klebsiella bacteria causing the infection. It is sensitive to every antibiotic except Ampicillin and Nitrofurantoin. The [facility] given should clear the infection. If still having symptoms, will switch to Cipro 500 gm BID #20. Recheck as needed. (Would like a report on her husband's leg pain).

## 2017-01-15 NOTE — Telephone Encounter (Signed)
Patient advised as below. Patient is still having some pain and discomfort and cloudy urine. Sent in RX for Cipro for patient to take.

## 2017-01-23 ENCOUNTER — Ambulatory Visit: Payer: Self-pay | Admitting: Family Medicine

## 2017-01-26 ENCOUNTER — Ambulatory Visit: Payer: Self-pay | Admitting: Family Medicine

## 2017-02-05 ENCOUNTER — Ambulatory Visit: Payer: Self-pay | Admitting: Family Medicine

## 2017-02-09 DIAGNOSIS — R339 Retention of urine, unspecified: Secondary | ICD-10-CM | POA: Diagnosis not present

## 2017-02-09 DIAGNOSIS — N312 Flaccid neuropathic bladder, not elsewhere classified: Secondary | ICD-10-CM | POA: Diagnosis not present

## 2017-02-09 DIAGNOSIS — N390 Urinary tract infection, site not specified: Secondary | ICD-10-CM | POA: Diagnosis not present

## 2017-02-09 DIAGNOSIS — N319 Neuromuscular dysfunction of bladder, unspecified: Secondary | ICD-10-CM | POA: Diagnosis not present

## 2017-03-30 DIAGNOSIS — R69 Illness, unspecified: Secondary | ICD-10-CM | POA: Diagnosis not present

## 2017-04-13 ENCOUNTER — Other Ambulatory Visit: Payer: Self-pay | Admitting: Family Medicine

## 2017-04-13 DIAGNOSIS — F411 Generalized anxiety disorder: Secondary | ICD-10-CM

## 2017-05-01 DIAGNOSIS — N390 Urinary tract infection, site not specified: Secondary | ICD-10-CM | POA: Diagnosis not present

## 2017-05-01 DIAGNOSIS — N312 Flaccid neuropathic bladder, not elsewhere classified: Secondary | ICD-10-CM | POA: Diagnosis not present

## 2017-05-01 DIAGNOSIS — N319 Neuromuscular dysfunction of bladder, unspecified: Secondary | ICD-10-CM | POA: Diagnosis not present

## 2017-05-01 DIAGNOSIS — R339 Retention of urine, unspecified: Secondary | ICD-10-CM | POA: Diagnosis not present

## 2017-05-15 ENCOUNTER — Other Ambulatory Visit: Payer: Self-pay | Admitting: Family Medicine

## 2017-05-15 DIAGNOSIS — N183 Chronic kidney disease, stage 3 unspecified: Secondary | ICD-10-CM

## 2017-05-15 DIAGNOSIS — E1122 Type 2 diabetes mellitus with diabetic chronic kidney disease: Secondary | ICD-10-CM

## 2017-05-16 ENCOUNTER — Other Ambulatory Visit: Payer: Self-pay | Admitting: Family Medicine

## 2017-05-16 DIAGNOSIS — N183 Chronic kidney disease, stage 3 unspecified: Secondary | ICD-10-CM

## 2017-05-16 DIAGNOSIS — E1122 Type 2 diabetes mellitus with diabetic chronic kidney disease: Secondary | ICD-10-CM

## 2017-07-05 DIAGNOSIS — R69 Illness, unspecified: Secondary | ICD-10-CM | POA: Diagnosis not present

## 2017-07-20 ENCOUNTER — Ambulatory Visit (INDEPENDENT_AMBULATORY_CARE_PROVIDER_SITE_OTHER): Payer: Medicare HMO

## 2017-07-20 VITALS — BP 124/64 | HR 72 | Temp 99.0°F | Ht 61.0 in | Wt 189.0 lb

## 2017-07-20 DIAGNOSIS — Z0000 Encounter for general adult medical examination without abnormal findings: Secondary | ICD-10-CM | POA: Diagnosis not present

## 2017-07-20 NOTE — Progress Notes (Signed)
Subjective:   Cynthia Sims is a 67 y.o. female who presents for Medicare Annual (Subsequent) preventive examination.  Review of Systems:  N/A  Cardiac Risk Factors include: advanced age (>38men, >58 women);diabetes mellitus;hypertension;dyslipidemia;obesity (BMI >30kg/m2)     Objective:     Vitals: BP 124/64 (BP Location: Left Arm)   Pulse 72   Temp 99 F (37.2 C) (Oral)   Ht 5\' 1"  (1.549 m)   Wt 189 lb (85.7 kg)   BMI 35.71 kg/m   Body mass index is 35.71 kg/m.  Advanced Directives 07/20/2017 07/20/2016 01/06/2016 12/27/2015 10/29/2015 01/14/2015  Does Patient Have a Medical Advance Directive? No No No No No No  Would patient like information on creating a medical advance directive? No - Patient declined - No - patient declined information Yes Higher education careers adviser given - -    Tobacco Social History   Tobacco Use  Smoking Status Never Smoker  Smokeless Tobacco Never Used     Counseling given: Not Answered   Clinical Intake:  Pre-visit preparation completed: Yes  Pain : No/denies pain Pain Score: 0-No pain     Nutritional Status: BMI > 30  Obese Nutritional Risks: Nausea/ vomitting/ diarrhea(Diarrhea intermittenly- unsure cause.) Diabetes: Yes(type 2) CBG done?: No Did pt. bring in CBG monitor from home?: No  How often do you need to have someone help you when you read instructions, pamphlets, or other written materials from your doctor or pharmacy?: 1 - Never  Interpreter Needed?: No  Information entered by :: [facility], LPN  Past Medical History:  Diagnosis Date  . ALT (SGPT) level raised   . Anemia   . Anxiety   . Arthritis   . Diabetes mellitus without complication ([place])   . Elevated cholesterol   . GERD (gastroesophageal reflux disease)   . Hemorrhoids   . Hypertension   . Kidney disease    Past Surgical History:  Procedure Laterality Date  . CESAREAN SECTION  1983  . CESAREAN SECTION    . CHOLECYSTECTOMY    . COLONOSCOPY WITH PROPOFOL  N/A 10/29/2015   Procedure: COLONOSCOPY WITH PROPOFOL;  Surgeon: Manya Silvas, MD;  Location: [facility] ENDOSCOPY;  Service: Endoscopy;  Laterality: N/A;  . DILATION AND CURETTAGE OF UTERUS  2001  . ESOPHAGOGASTRODUODENOSCOPY (EGD) WITH PROPOFOL N/A 10/29/2015   Procedure: ESOPHAGOGASTRODUODENOSCOPY (EGD) WITH PROPOFOL;  Surgeon: Manya Silvas, MD;  Location: [facility] ENDOSCOPY;  Service: Endoscopy;  Laterality: N/A;   Family History  Problem Relation Age of Onset  . Alzheimer's disease Mother   . COPD Mother   . Breast cancer Mother 16  . Osteoporosis Mother   . Heart attack Father   . Lupus Father   . Diabetes Father   . Hypertension Father   . Diabetes Sister   . COPD Maternal Grandmother   . Diabetes Paternal Grandfather   . Diabetes Sister   . Diabetes Sister    Social History   Socioeconomic History  . Marital status: Married    Spouse name: None  . Number of children: 5  . Years of education: None  . Highest education level: 8th grade  Social Needs  . Financial resource strain: Very hard  . Food insecurity - worry: Sometimes true  . Food insecurity - inability: Never true  . Transportation needs - medical: No  . Transportation needs - non-medical: No  Occupational History  . Occupation: retired  Tobacco Use  . Smoking status: Never Smoker  . Smokeless tobacco: Never Used  Substance and Sexual Activity  . Alcohol use: No    Alcohol/week: 0.0 oz  . Drug use: No  . Sexual activity: None  Other Topics Concern  . None  Social History Narrative   Applied for food stamps. Pt is living off social security money and is raising one of her grandchildren.     Outpatient Encounter Medications as of 07/20/2017  Medication Sig  . aspirin EC 81 MG tablet Take 81 mg by mouth daily.   . citalopram (CELEXA) 20 MG tablet TAKE ONE TABLET BY MOUTH AT BEDTIME  . Cranberry 125 MG TABS Take 1 tablet by mouth daily.  . ferrous sulfate 325 (65 FE) MG tablet Take 1 tablet by mouth  daily.  Marland Kitchen glipiZIDE (GLUCOTROL) 10 MG tablet Take 1 tablet (10 mg total) by mouth 2 (two) times daily.  Marland Kitchen glucose blood test strip Use as instructed to check fasting blood sugar once daily.  . Lancets (ONETOUCH ULTRASOFT) lancets Use as instructed to test fasting blood sugar daily.  Marland Kitchen lisinopril-hydrochlorothiazide (PRINZIDE,ZESTORETIC) 10-12.5 MG tablet Take 1 tablet by mouth daily.  Marland Kitchen lovastatin (MEVACOR) 40 MG tablet TAKE ONE TABLET BY MOUTH ONCE DAILY  . metFORMIN (GLUCOPHAGE) 500 MG tablet Take 1 tablet (500 mg total) by mouth 2 (two) times daily with a meal.  . methocarbamol (ROBAXIN) 500 MG tablet Take 1 tablet (500 mg total) by mouth 4 (four) times daily.  Marland Kitchen omeprazole (PRILOSEC) 40 MG capsule Take 40 mg by mouth daily.   . ranitidine (ZANTAC) 150 MG tablet Take 150 mg by mouth at bedtime.  . [DISCONTINUED] ciprofloxacin (CIPRO) 500 MG tablet Take 1 tablet (500 mg total) by mouth 2 (two) times daily.   No facility-administered encounter medications on file as of 07/20/2017.     Activities of Daily Living In your present state of health, do you have any difficulty performing the following activities: 07/20/2017 01/09/2017  Hearing? N N  Vision? Y N  Comment Needs an eye exam. Has not been able to have an eye exam due to money. Referral to careguide sent today for assistance. -  Difficulty concentrating or making decisions? N Y  Walking or climbing stairs? Y Y  Comment Due to leg pains.  -  Dressing or bathing? N N  Doing errands, shopping? N Y  Conservation officer, nature and eating ? N -  Using the Toilet? N -  In the past six months, have you accidently leaked urine? N -  Comment Has to catheterize herself. -  Do you have problems with loss of bowel control? Y -  Comment Intermittenly, wears protection. -  Managing your Medications? N -  Managing your Finances? N -  Housekeeping or managing your Housekeeping? N -  Some recent data might be hidden    Patient Care Team: Chrismon, Vickki Muff,  PA as PCP - General (Physician Assistant) Sharlotte Alamo, DPM as Consulting Physician (Podiatry)    Assessment:   This is a routine wellness examination for Cynthia Sims.  Exercise Activities and Dietary recommendations Current Exercise Habits: The patient does not participate in regular exercise at present, Exercise limited by: Other - see comments(leg pains due to diabetes)  Goals    . DIET - REDUCE SUGAR INTAKE     Recommend monitoring amount of sugar consumed and avoiding sweets as much as possible.        Fall Risk Fall Risk  07/20/2017 01/09/2017 01/24/2016 12/27/2015 11/16/2015  Falls in the past year? No Yes No No No  Number falls in past yr: - 2 or more - - -  Injury with Fall? - No - - -  Risk for fall due to : - - - Impaired balance/gait -   Is the patient's home free of loose throw rugs in walkways, pet beds, electrical cords, etc?   yes      Grab bars in the bathroom? no      Handrails on the stairs?   no      Adequate lighting?   yes  Timed Get Up and Go performed: N/A  Depression Screen PHQ 2/9 Scores 07/20/2017 01/09/2017 12/27/2015 11/16/2015  PHQ - 2 Score 6 6 2  0  PHQ- 9 Score 20 23 14  -     Cognitive Function: Pt declined screening today.         Immunization History  Administered Date(s) Administered  . Influenza-Unspecified 03/16/2014, 03/24/2016  . Pneumococcal Conjugate-13 09/25/2014  . Pneumococcal Polysaccharide-23 11/16/2015  . Tdap 09/25/2014    Qualifies for Shingles Vaccine? Due for Shingles vaccine. Declined my offer to administer today. Education has been provided regarding the importance of this vaccine. Pt has been advised to call her insurance company to determine her out of pocket expense. Advised she may also receive this vaccine at her local pharmacy or Health Dept. Verbalized acceptance and understanding.  Screening Tests Health Maintenance  Topic Date Due  . Hepatitis C Screening  12-08-1950  . OPHTHALMOLOGY EXAM  08/22/1958  . HEMOGLOBIN A1C   04/27/2017  . MAMMOGRAM  08/31/2017  . FOOT EXAM  12/05/2017  . TETANUS/TDAP  09/24/2024  . COLONOSCOPY  10/28/2025  . INFLUENZA VACCINE  Completed  . DEXA SCAN  Completed  . PNA vac Low Risk Adult  Completed    Cancer Screenings: Lung: Low Dose CT Chest recommended if Age 21-80 years, 30 pack-year currently smoking OR have quit w/in 15years. Patient does not qualify. Breast:  Up to date on Mammogram? Yes   Up to date of Bone Density/Dexa? Yes Colorectal: Up to date  Additional Screenings:  Hepatitis B/HIV/Syphillis: Pt declines today.  Hepatitis C Screening:Pt declines today.     Plan:  I have personally reviewed and addressed the Medicare Annual Wellness questionnaire and have noted the following in the patient's chart:  A. Medical and social history B. Use of alcohol, tobacco or illicit drugs  C. Current medications and supplements D. Functional ability and status E.  Nutritional status F.  Physical activity G. Advance directives H. List of other physicians I.  Hospitalizations, surgeries, and ER visits in previous 12 months J.  [place] such as hearing and vision if needed, cognitive and depression L. Referrals and appointments - none  In addition, I have reviewed and discussed with patient certain preventive protocols, quality metrics, and best practice recommendations. A written personalized care plan for preventive services as well as general preventive health recommendations were provided to patient.  See attached scanned questionnaire for additional information.   Signed,  Fabio Neighbors, LPN Nurse Health Advisor   Nurse Recommendations: Advised pt to schedule physical to follow up on concerns she had today. Pt has had diarrhea intermittently recently and is unsure of the cause. Pt failed depression screening and is interested in changing her depression medicine due to "it not working anymore." Pt declined the Hepatitis C lab screen due to wanting  to add this on with yearly blood work/ next physical apt. Pt states she has not had an eye exam in over 3 years due  to cost. Referral sent to care guide to see about getting assistance for this. Pt is due for a Hgb A1c check at next OV.   Documentation and recommendations of the Nurse Health Advisor was reviewed. Was available for consultation and agree with note and plan.

## 2017-07-20 NOTE — Patient Instructions (Signed)
Cynthia Sims , Thank you for taking time to come for your Medicare Wellness Visit. I appreciate your ongoing commitment to your health goals. Please review the following plan we discussed and let me know if I can assist you in the future.   Screening recommendations/referrals: Colonoscopy: Up to date Mammogram: Up to date Bone Density: Up to date Recommended yearly ophthalmology/optometry visit for glaucoma screening and checkup Recommended yearly dental visit for hygiene and checkup  Vaccinations: Influenza vaccine: Up to date Pneumococcal vaccine: Up to date Tdap vaccine: Up to date Shingles vaccine: Pt declines today.     Advanced directives: Advance directive discussed with you today. Even though you declined this today please call our office should you change your mind and we can give you the proper paperwork for you to fill out.  Conditions/risks identified: Obesity- Recommend monitoring amount of sugar consumed and avoiding sweets as much as possible.   Next appointment: 08/02/17 @ 9:00 AM   Preventive Care 65 Years and Older, Female Preventive care refers to lifestyle choices and visits with your health care provider that can promote health and wellness. What does preventive care include?  A yearly physical exam. This is also called an annual well check.  Dental exams once or twice a year.  Routine eye exams. Ask your health care provider how often you should have your eyes checked.  Personal lifestyle choices, including:  Daily care of your teeth and gums.  Regular physical activity.  Eating a healthy diet.  Avoiding tobacco and drug use.  Limiting alcohol use.  Practicing safe sex.  Taking low-dose aspirin every day.  Taking vitamin and mineral supplements as recommended by your health care provider. What happens during an annual well check? The services and screenings done by your health care provider during your annual well check will depend on your age,  overall health, lifestyle risk factors, and family history of disease. Counseling  Your health care provider may ask you questions about your:  Alcohol use.  Tobacco use.  Drug use.  Emotional well-being.  Home and relationship well-being.  Sexual activity.  Eating habits.  History of falls.  Memory and ability to understand (cognition).  Work and work Statistician.  Reproductive health. Screening  You may have the following tests or measurements:  Height, weight, and BMI.  Blood pressure.  Lipid and cholesterol levels. These may be checked every 5 years, or more frequently if you are over 75 years old.  Skin check.  Lung cancer screening. You may have this screening every year starting at age 72 if you have a 30-pack-year history of smoking and currently smoke or have quit within the past 15 years.  Fecal occult blood test (FOBT) of the stool. You may have this test every year starting at age 54.  Flexible sigmoidoscopy or colonoscopy. You may have a sigmoidoscopy every 5 years or a colonoscopy every 10 years starting at age 68.  Hepatitis C blood test.  Hepatitis B blood test.  Sexually transmitted disease (STD) testing.  Diabetes screening. This is done by checking your blood sugar (glucose) after you have not eaten for a while (fasting). You may have this done every 1-3 years.  Bone density scan. This is done to screen for osteoporosis. You may have this done starting at age 59.  Mammogram. This may be done every 1-2 years. Talk to your health care provider about how often you should have regular mammograms. Talk with your health care provider about your test results,  treatment options, and if necessary, the need for more tests. Vaccines  Your health care provider may recommend certain vaccines, such as:  Influenza vaccine. This is recommended every year.  Tetanus, diphtheria, and acellular pertussis (Tdap, Td) vaccine. You may need a Td booster every 10  years.  Zoster vaccine. You may need this after age 20.  Pneumococcal 13-valent conjugate (PCV13) vaccine. One dose is recommended after age 16.  Pneumococcal polysaccharide (PPSV23) vaccine. One dose is recommended after age 7. Talk to your health care provider about which screenings and vaccines you need and how often you need them. This information is not intended to replace advice given to you by your health care provider. Make sure you discuss any questions you have with your health care provider. Document Released: 06/25/2015 Document Revised: 02/16/2016 Document Reviewed: 03/30/2015 Elsevier Interactive Patient Education  2017 [place] Prevention in the Home Falls can cause injuries. They can happen to people of all ages. There are many things you can do to make your home safe and to help prevent falls. What can I do on the outside of my home?  Regularly fix the edges of walkways and driveways and fix any cracks.  Remove anything that might make you trip as you walk through a door, such as a raised step or threshold.  Trim any bushes or trees on the path to your home.  Use bright outdoor lighting.  Clear any walking paths of anything that might make someone trip, such as rocks or tools.  Regularly check to see if handrails are loose or broken. Make sure that both sides of any steps have handrails.  Any raised decks and porches should have guardrails on the edges.  Have any leaves, snow, or ice cleared regularly.  Use sand or salt on walking paths during winter.  Clean up any spills in your garage right away. This includes oil or grease spills. What can I do in the bathroom?  Use night lights.  Install grab bars by the toilet and in the tub and shower. Do not use towel bars as grab bars.  Use non-skid mats or decals in the tub or shower.  If you need to sit down in the shower, use a plastic, non-slip stool.  Keep the floor dry. Clean up any water that  spills on the floor as soon as it happens.  Remove soap buildup in the tub or shower regularly.  Attach bath mats securely with double-sided non-slip rug tape.  Do not have throw rugs and other things on the floor that can make you trip. What can I do in the bedroom?  Use night lights.  Make sure that you have a light by your bed that is easy to reach.  Do not use any sheets or blankets that are too big for your bed. They should not hang down onto the floor.  Have a firm chair that has side arms. You can use this for support while you get dressed.  Do not have throw rugs and other things on the floor that can make you trip. What can I do in the kitchen?  Clean up any spills right away.  Avoid walking on wet floors.  Keep items that you use a lot in easy-to-reach places.  If you need to reach something above you, use a strong step stool that has a grab bar.  Keep electrical cords out of the way.  Do not use floor polish or wax that makes floors  slippery. If you must use wax, use non-skid floor wax.  Do not have throw rugs and other things on the floor that can make you trip. What can I do with my stairs?  Do not leave any items on the stairs.  Make sure that there are handrails on both sides of the stairs and use them. Fix handrails that are broken or loose. Make sure that handrails are as long as the stairways.  Check any carpeting to make sure that it is firmly attached to the stairs. Fix any carpet that is loose or worn.  Avoid having throw rugs at the top or bottom of the stairs. If you do have throw rugs, attach them to the floor with carpet tape.  Make sure that you have a light switch at the top of the stairs and the bottom of the stairs. If you do not have them, ask someone to add them for you. What else can I do to help prevent falls?  Wear shoes that:  Do not have high heels.  Have rubber bottoms.  Are comfortable and fit you well.  Are closed at the  toe. Do not wear sandals.  If you use a stepladder:  Make sure that it is fully opened. Do not climb a closed stepladder.  Make sure that both sides of the stepladder are locked into place.  Ask someone to hold it for you, if possible.  Clearly mark and make sure that you can see:  Any grab bars or handrails.  First and last steps.  Where the edge of each step is.  Use tools that help you move around (mobility aids) if they are needed. These include:  Canes.  Walkers.  Scooters.  Crutches.  Turn on the lights when you go into a dark area. Replace any light bulbs as soon as they burn out.  Set up your furniture so you have a clear path. Avoid moving your furniture around.  If any of your floors are uneven, fix them.  If there are any pets around you, be aware of where they are.  Review your medicines with your doctor. Some medicines can make you feel dizzy. This can increase your chance of falling. Ask your doctor what other things that you can do to help prevent falls. This information is not intended to replace advice given to you by your health care provider. Make sure you discuss any questions you have with your health care provider. Document Released: 03/25/2009 Document Revised: 11/04/2015 Document Reviewed: 07/03/2014 Elsevier Interactive Patient Education  2017 [employer].

## 2017-07-23 DIAGNOSIS — N319 Neuromuscular dysfunction of bladder, unspecified: Secondary | ICD-10-CM | POA: Diagnosis not present

## 2017-07-23 DIAGNOSIS — N312 Flaccid neuropathic bladder, not elsewhere classified: Secondary | ICD-10-CM | POA: Diagnosis not present

## 2017-07-23 DIAGNOSIS — N390 Urinary tract infection, site not specified: Secondary | ICD-10-CM | POA: Diagnosis not present

## 2017-07-23 DIAGNOSIS — R339 Retention of urine, unspecified: Secondary | ICD-10-CM | POA: Diagnosis not present

## 2017-08-02 ENCOUNTER — Encounter: Payer: Self-pay | Admitting: Family Medicine

## 2017-08-02 ENCOUNTER — Ambulatory Visit (INDEPENDENT_AMBULATORY_CARE_PROVIDER_SITE_OTHER): Payer: Medicare HMO | Admitting: Family Medicine

## 2017-08-02 VITALS — BP 124/60 | HR 63 | Temp 98.4°F | Ht 61.0 in | Wt 189.2 lb

## 2017-08-02 DIAGNOSIS — F418 Other specified anxiety disorders: Secondary | ICD-10-CM | POA: Diagnosis not present

## 2017-08-02 DIAGNOSIS — N312 Flaccid neuropathic bladder, not elsewhere classified: Secondary | ICD-10-CM

## 2017-08-02 DIAGNOSIS — E0842 Diabetes mellitus due to underlying condition with diabetic polyneuropathy: Secondary | ICD-10-CM | POA: Diagnosis not present

## 2017-08-02 DIAGNOSIS — N183 Chronic kidney disease, stage 3 unspecified: Secondary | ICD-10-CM

## 2017-08-02 DIAGNOSIS — R69 Illness, unspecified: Secondary | ICD-10-CM | POA: Diagnosis not present

## 2017-08-02 DIAGNOSIS — R159 Full incontinence of feces: Secondary | ICD-10-CM | POA: Diagnosis not present

## 2017-08-02 DIAGNOSIS — E782 Mixed hyperlipidemia: Secondary | ICD-10-CM

## 2017-08-02 DIAGNOSIS — Z1159 Encounter for screening for other viral diseases: Secondary | ICD-10-CM

## 2017-08-02 DIAGNOSIS — I10 Essential (primary) hypertension: Secondary | ICD-10-CM | POA: Diagnosis not present

## 2017-08-02 DIAGNOSIS — N3000 Acute cystitis without hematuria: Secondary | ICD-10-CM | POA: Diagnosis not present

## 2017-08-02 LAB — POCT URINALYSIS DIPSTICK
Glucose, UA: NEGATIVE
Ketones, UA: NEGATIVE
Nitrite, UA: POSITIVE
Protein, UA: NEGATIVE
Spec Grav, UA: 1.02 (ref 1.010–1.025)
Urobilinogen, UA: 0.2 U/dL
pH, UA: 5 (ref 5.0–8.0)

## 2017-08-02 LAB — POCT UA - MICROALBUMIN: Microalbumin Ur, POC: 50 mg/L

## 2017-08-02 LAB — POCT GLYCOSYLATED HEMOGLOBIN (HGB A1C): Hemoglobin A1C: 6.9

## 2017-08-02 MED ORDER — AMOXICILLIN-POT CLAVULANATE 875-125 MG PO TABS: 1.0000 | ORAL_TABLET | Freq: Two times a day (BID) | ORAL | 0 refills | Status: DC

## 2017-08-02 MED ORDER — CITALOPRAM HYDROBROMIDE 20 MG PO TABS: 30.0000 mg | ORAL_TABLET | Freq: Every day | ORAL | 1 refills | Status: DC

## 2017-08-02 NOTE — Progress Notes (Addendum)
[name], Female    DOB: [birth year], 67 y.o.   MRN: [id number] Visit Date: 08/02/2017  Today's Provider: [name], PA   Chief Complaint  Patient presents with  . Annual Exam   Subjective:     Complete Physical Cynthia Sims is a 67 y.o. female. She feels fairly well. She reports exercising none. She reports she is sleeping poorly.  -----------------------------------------------------------   Review of Systems  Constitutional: Positive for appetite change.       Irritability   HENT: Negative.   Eyes: Negative.   Respiratory: Negative.   Cardiovascular: Positive for leg swelling.  Gastrointestinal: Positive for abdominal pain and diarrhea.  Endocrine: Negative.   Genitourinary: Negative.   Musculoskeletal: Negative.   Skin: Negative.   Allergic/Immunologic: Negative.   Neurological: Positive for weakness and numbness.  Hematological: Bruises/bleeds easily.  Psychiatric/Behavioral: Positive for sleep disturbance. The patient is nervous/anxious.     Social History   Socioeconomic History  . Marital status: Married    Spouse name: Not on file  . Number of children: 5  . Years of education: Not on file  . Highest education level: 8th grade  Social Needs  . Financial resource strain: Very hard  . Food insecurity - worry: Sometimes true  . Food insecurity - inability: Never true  . Transportation needs - medical: No  . Transportation needs - non-medical: No  Occupational History  . Occupation: retired  Tobacco Use  . Smoking status: Never Smoker  . Smokeless tobacco: Never Used  Substance and Sexual Activity  . Alcohol use: No    Alcohol/week: 0.0 oz  . Drug use: No  . Sexual activity: Not on file  Other Topics Concern  . Not on file  Social History Narrative   Applied for food stamps. Pt is living off social security money and is raising one of her grandchildren.     Past Medical History:  Diagnosis Date  . ALT (SGPT)  level raised   . Anemia   . Anxiety   . Arthritis   . Diabetes mellitus without complication ([place])   . Elevated cholesterol   . GERD (gastroesophageal reflux disease)   . Hemorrhoids   . Hypertension   . Kidney disease      Patient Active Problem List   Diagnosis Date Noted  . Can't get food down 09/13/2015  . ALT (SGPT) level raised 09/13/2015  . Acid reflux 09/13/2015  . H/O adenomatous polyp of colon 09/13/2015  . Anxiety 01/14/2015  . Hypertension 01/14/2015  . Chronic kidney disease (CKD), stage III (moderate) ([place]) 01/13/2015  . Cannot sleep 02/26/2009  . Anxiety state 02/26/2009  . 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 Diabetes ([place]) 12/14/2005  . 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 Menopausal symptom 12/14/2005    Past Surgical History:  Procedure Laterality Date  . CESAREAN SECTION  1983  . CESAREAN SECTION    . CHOLECYSTECTOMY    . COLONOSCOPY WITH PROPOFOL N/A 10/29/2015   Procedure: COLONOSCOPY WITH PROPOFOL;  Surgeon: Manya Silvas, MD;  Location[facility] ENDOSCOPY;  Service: Endoscopy;  Laterality: N/A;  . DILATION AND CURETTAGE OF UTERUS  2001  . ESOPHAGOGASTRODUODENOSCOPY (EGD) WITH PROPOFOL N/A 10/29/2015   Procedure: ESOPHAGOGASTRODUODENOSCOPY (EGD) WITH PROPOFOL;  Surgeon: Manya Silvas, MD;  Location: [facility] ENDOSCOPY;  Service: Endoscopy;  Laterality:  N/A;    Her family history includes Alzheimer's disease in her mother; Breast cancer (age of onset: 68) in her mother; COPD in her maternal grandmother and mother; Diabetes in her father, paternal grandfather, sister, sister, and sister; Heart attack in her father; Hypertension in her father; Lupus in her father; Osteoporosis in her mother.     No Known Allergies  Current Outpatient Medications:  .  aspirin EC 81 MG tablet, Take 81 mg by mouth daily. , Disp: , Rfl:    .  citalopram (CELEXA) 20 MG tablet, TAKE ONE TABLET BY MOUTH AT BEDTIME, Disp: 90 tablet, Rfl: 1 .  Cranberry 125 MG TABS, Take 1 tablet by mouth daily., Disp: , Rfl:  .  ferrous sulfate 325 (65 FE) MG tablet, Take 1 tablet by mouth daily., Disp: , Rfl:  .  glipiZIDE (GLUCOTROL) 10 MG tablet, Take 1 tablet (10 mg total) by mouth 2 (two) times daily., Disp: 180 tablet, Rfl: 3 .  glucose blood test strip, Use as instructed to check fasting blood sugar once daily., Disp: 100 each, Rfl: 12 .  Lancets (ONETOUCH ULTRASOFT) lancets, Use as instructed to test fasting blood sugar daily., Disp: 100 each, Rfl: 12 .  lisinopril-hydrochlorothiazide (PRINZIDE,ZESTORETIC) 10-12.5 MG tablet, Take 1 tablet by mouth daily., Disp: 90 tablet, Rfl: 3 .  lovastatin (MEVACOR) 40 MG tablet, TAKE ONE TABLET BY MOUTH ONCE DAILY, Disp: 90 tablet, Rfl: 0 .  metFORMIN (GLUCOPHAGE) 500 MG tablet, Take 1 tablet (500 mg total) by mouth 2 (two) times daily with a meal., Disp: 180 tablet, Rfl: 3 .  methocarbamol (ROBAXIN) 500 MG tablet, Take 1 tablet (500 mg total) by mouth 4 (four) times daily., Disp: 30 tablet, Rfl: 0 .  omeprazole (PRILOSEC) 40 MG capsule, Take 40 mg by mouth daily. , Disp: , Rfl:  .  ranitidine (ZANTAC) 150 MG tablet, Take 150 mg by mouth at bedtime., Disp: , Rfl:   Patient Care Team: Liviya Santini, Vickki Muff, PA as PCP - General (Physician Assistant) Sharlotte Alamo, DPM as Consulting Physician (Podiatry)     Objective:   Vitals: BP 124/60 (BP Location: Right Arm, Patient Position: Sitting, Cuff Size: Normal)   Pulse 63   Temp 98.4 F (36.9 C) (Oral)   Ht 5' 1"  (1.549 m)   Wt 189 lb 3.2 oz (85.8 kg)   SpO2 99%   BMI 35.75 kg/m   Physical Exam  Constitutional: She is oriented to person, place, and time. She appears well-developed and well-nourished.  HENT:  Head: Normocephalic and atraumatic.  Right Ear: External ear normal.  Left Ear: External ear normal.  Nose: Nose normal.  Mouth/Throat:  Oropharynx is clear and moist.  Eyes: Conjunctivae and EOM are normal. Pupils are equal, round, and reactive to light. Right eye exhibits no discharge.  Neck: Normal range of motion. Neck supple. No tracheal deviation present. No thyromegaly present.  Cardiovascular: Normal rate, regular rhythm, normal heart sounds and intact distal pulses.  No murmur heard. Pulmonary/Chest: Effort normal and breath sounds normal. No respiratory distress. She has no wheezes. She has no rales. She exhibits no tenderness.  Abdominal: Soft. She exhibits no distension and no mass. There is no tenderness. There is no rebound and no guarding.  Musculoskeletal: Normal range of motion. She exhibits no edema or tenderness.  Slight crepitus right knee.  Lymphadenopathy:    She has no cervical adenopathy.  Neurological: She is alert and oriented to person, place, and time. She has normal reflexes. No cranial nerve  deficit. She exhibits normal muscle tone. Coordination normal.  Numbness ball and toes of each foot.  Skin: Skin is warm and dry. No rash noted. No erythema.  Psychiatric: She has a normal mood and affect. Her behavior is normal. Judgment and thought content normal.   Diabetic Foot Form - Detailed   Diabetic Foot Exam - detailed Diabetic Foot exam was performed with the following findings:  Yes 08/02/2017  9:49 AM  Visual Foot Exam completed.:  Yes  Can the patient see the bottom of their feet?:  Yes Are the shoes appropriate in style and fit?:  Yes Is there swelling or and abnormal foot shape?:  No Is there a claw toe deformity?:  No Is there elevated skin temparature?:  No Is there foot or ankle muscle weakness?:  No Normal Range of Motion:  Yes Pulse Foot Exam completed.:  Yes  Right posterior Tibialias:  Present Left posterior Tibialias:  Present  Right Dorsalis Pedis:  Present Left Dorsalis Pedis:  Present  Sensory Foot Exam Completed.:  Yes Semmes-Weinstein Monofilament Test R Site 1-Great Toe:   Neg L Site 1-Great Toe:  Neg    Comments:  Callus plantar surface left foot 3rd and 4th metatarsal head.     Activities of Daily Living In your present state of health, do you have any difficulty performing the following activities: 07/20/2017 01/09/2017  Hearing? N N  Vision? Y N  Comment Needs an eye exam. Has not been able to have an eye exam due to money. Referral to [facility]guide sent today for assistance. -  Difficulty concentrating or making decisions? N Y  Walking or climbing stairs? Y Y  Comment Due to leg pains.  -  Dressing or bathing? N N  Doing errands, shopping? N Y  Conservation officer, nature and eating ? N -  Using the Toilet? N -  In the past six months, have you accidently leaked urine? N -  Comment Has to catheterize herself. -  Do you have problems with loss of bowel control? Y -  Comment Intermittenly, wears protection. -  Managing your Medications? N -  Managing your Finances? N -  Housekeeping or managing your Housekeeping? N -  Some recent data might be hidden    Fall Risk Assessment Fall Risk  07/20/2017 01/09/2017 01/24/2016 12/27/2015 11/16/2015  Falls in the past year? No Yes No No No  Number falls in past yr: - 2 or more - - -  Injury with Fall? - No - - -  Risk for fall due to : - - - Impaired balance/gait -     Depression Screen PHQ 2/9 Scores 07/20/2017 01/09/2017 12/27/2015 11/16/2015  PHQ - 2 Score 6 6 2  0  PHQ- 9 Score 20 23 14  -    Assessment & Plan:    Annual Physical Reviewed patient's Family Medical History Reviewed and updated list of patient's medical providers Assessment of cognitive impairment was done Assessed patient's functional ability Established a written schedule for health screening [place] Completed and Reviewed  Exercise Activities and Dietary recommendations Goals    . DIET - REDUCE SUGAR INTAKE     Recommend monitoring amount of sugar consumed and avoiding sweets as much as possible.        Immunization History    Administered Date(s) Administered  . Influenza-Unspecified 03/16/2014, 03/24/2016, 03/12/2017  . Pneumococcal Conjugate-13 09/25/2014  . Pneumococcal Polysaccharide-23 11/16/2015  . Tdap 09/25/2014    Health Maintenance  Topic Date Due  .  Hepatitis C Screening  [birth year]  . HEMOGLOBIN A1C  04/27/2017  . OPHTHALMOLOGY EXAM  11/30/2017 (Originally 08/22/1960)  . MAMMOGRAM  08/31/2017  . FOOT EXAM  12/05/2017  . TETANUS/TDAP  09/24/2024  . COLONOSCOPY  10/28/2025  . INFLUENZA VACCINE  Completed  . DEXA SCAN  Completed  . PNA vac Low Risk Adult  Completed     Discussed health benefits of physical activity, and encouraged her to engage in regular exercise appropriate for her age and condition.    ------------------------------------------------------------------------------------------------------------ 1. Diabetes mellitus due to underlying condition with diabetic polyneuropathy, without long-term current use of insulin (HCC) Unchanged peripheral neuropathy in both feet. Followed by podiatrist (Dr. Cleda Mccreedy). Microalbumin higher than last check (may be due to acute UTI). Still on ACE-I. Continue Glipizide 10 mg BID with Metfomin 500 mg BID. Hgb A1C 6.9% today. Recheck labs and follow up pending reports. - Lipid Profile - POCT UA - Microalbumin - POCT HgB A1C - CBC with Differential/Platelet - Comprehensive metabolic panel  2. Essential hypertension BP well controlled and tolerating Lisinopril-HCTZ 10-12.5 mg qd. Recheck routine labs and follow up pending reports. - Lipid Profile - CBC with Differential/Platelet - Comprehensive metabolic panel - TSH  3. Chronic kidney disease (CKD), stage III (moderate) (HCC) Creatinine 1.52 on 10-25-16 (slightly higher than previous readings). Will recheck CMP and CBC today.  - CBC with Differential/Platelet - Comprehensive metabolic panel  4. Atony of bladder Unchanged and continues to use self-catheterization up to 4 times a day. Recent  flare of recurrent UTI. Can't urinate without catheter. 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 Must use insertion kit catheters three times a day due to atony of bladder causing permanent urinary retention and recurrent urinary tract infections. - POCT Urinalysis Dipstick  5. Combined fat and carbohydrate induced hyperlipemia Still taking Lovastatin 40 mg qd. Recheck lipids, CMP and TSH. - Lipid Profile - Comprehensive metabolic panel - TSH  6. Anxiety with depression Symptoms and anxiety and sadness increased since husband exhibiting more symptoms of early dementia. His neurologist has referred [facility] to [facility] for psychiatric evaluation for possible PTSD. Will increase her Celexa to 30 mg qd and check routine labs. - CBC with Differential/Platelet - TSH - citalopram (CELEXA) 20 MG tablet; Take 1.5 tablets (30 mg total) by mouth at bedtime.  Dispense: 135 tablet; Refill: 1  7. Acute cystitis without hematuria Recurrence over the past couple days. No fever. Still has to use catheter for bladder atony 3 times a day. Urinalysis showed positive nitrites with TNTC bacteria and WBC's per hpf on microscopic exam. Given antibiotic and get C&S.  - POCT Urinalysis Dipstick - CBC with Differential/Platelet - Urine Culture - amoxicillin-clavulanate (AUGMENTIN) 875-125 MG tablet; Take 1 tablet by mouth 2 (two) times daily.  Dispense: 14 tablet; Refill: 0  8. Incontinence of feces, unspecified fecal incontinence type More frequent loose stools and incontinence. Not much help from probiotics and antidiarrheals with increased fiber in diet. Schedule gastroenterology referral. - Ambulatory referral to Gastroenterology  9. Need for hepatitis C screening test - Hepatitis C Antibody   [name], PA  [facility]

## 2017-08-03 LAB — CBC WITH DIFFERENTIAL/PLATELET
Basophils Absolute: 0 10*3/uL (ref 0.0–0.2)
Basos: 0 %
EOS (ABSOLUTE): 0.1 10*3/uL (ref 0.0–0.4)
Eos: 1 %
Hematocrit: 33.7 % — ABNORMAL LOW (ref 34.0–46.6)
Hemoglobin: 11.7 g/dL (ref 11.1–15.9)
Immature Grans (Abs): 0 10*3/uL (ref 0.0–0.1)
Immature Granulocytes: 0 %
Lymphocytes Absolute: 2.6 10*3/uL (ref 0.7–3.1)
Lymphs: 35 %
MCH: 31.4 pg (ref 26.6–33.0)
MCHC: 34.7 g/dL (ref 31.5–35.7)
MCV: 90 fL (ref 79–97)
Monocytes Absolute: 0.4 10*3/uL (ref 0.1–0.9)
Monocytes: 6 %
Neutrophils Absolute: 4.3 10*3/uL (ref 1.4–7.0)
Neutrophils: 58 %
Platelets: 146 10*3/uL — ABNORMAL LOW (ref 150–379)
RBC: 3.73 x10E6/uL — ABNORMAL LOW (ref 3.77–5.28)
RDW: 14.4 % (ref 12.3–15.4)
WBC: 7.4 10*3/uL (ref 3.4–10.8)

## 2017-08-03 LAB — COMPREHENSIVE METABOLIC PANEL
ALT: 20 IU/L (ref 0–32)
AST: 19 IU/L (ref 0–40)
Albumin/Globulin Ratio: 1.4 (ref 1.2–2.2)
Albumin: 4 g/dL (ref 3.6–4.8)
Alkaline Phosphatase: 73 IU/L (ref 39–117)
BUN/Creatinine Ratio: 12 (ref 12–28)
BUN: 16 mg/dL (ref 8–27)
Bilirubin Total: 0.3 mg/dL (ref 0.0–1.2)
CO2: 20 mmol/L (ref 20–29)
Calcium: 8.8 mg/dL (ref 8.7–10.3)
Chloride: 105 mmol/L (ref 96–106)
Creatinine, Ser: 1.3 mg/dL — ABNORMAL HIGH (ref 0.57–1.00)
GFR calc Af Amer: 49 mL/min/{1.73_m2} — ABNORMAL LOW (ref >59)
GFR calc non Af Amer: 42 mL/min/{1.73_m2} — ABNORMAL LOW (ref >59)
Globulin, Total: 2.9 g/dL (ref 1.5–4.5)
Glucose: 69 mg/dL (ref 65–99)
Potassium: 4.8 mmol/L (ref 3.5–5.2)
Sodium: 140 mmol/L (ref 134–144)
Total Protein: 6.9 g/dL (ref 6.0–8.5)

## 2017-08-03 LAB — LIPID PANEL
Chol/HDL Ratio: 2.5 {ratio} (ref 0.0–4.4)
Cholesterol, Total: 144 mg/dL (ref 100–199)
HDL: 57 mg/dL (ref >39)
LDL Calculated: 61 mg/dL (ref 0–99)
Triglycerides: 128 mg/dL (ref 0–149)
VLDL Cholesterol Cal: 26 mg/dL (ref 5–40)

## 2017-08-03 LAB — HEPATITIS C ANTIBODY: Hep C Virus Ab: <0.1 s/co ratio (ref 0.0–0.9)

## 2017-08-03 LAB — TSH: TSH: 1.13 u[IU]/mL (ref 0.450–4.500)

## 2017-08-06 LAB — URINE CULTURE

## 2017-08-15 ENCOUNTER — Other Ambulatory Visit: Payer: Self-pay | Admitting: Family Medicine

## 2017-08-15 DIAGNOSIS — N183 Chronic kidney disease, stage 3 unspecified: Secondary | ICD-10-CM

## 2017-08-15 DIAGNOSIS — E1122 Type 2 diabetes mellitus with diabetic chronic kidney disease: Secondary | ICD-10-CM

## 2017-08-24 ENCOUNTER — Encounter: Payer: Self-pay | Admitting: Family Medicine

## 2017-08-24 ENCOUNTER — Ambulatory Visit (INDEPENDENT_AMBULATORY_CARE_PROVIDER_SITE_OTHER): Payer: Medicare HMO | Admitting: Family Medicine

## 2017-08-24 VITALS — BP 116/64 | HR 69 | Temp 98.3°F

## 2017-08-24 DIAGNOSIS — N312 Flaccid neuropathic bladder, not elsewhere classified: Secondary | ICD-10-CM | POA: Diagnosis not present

## 2017-08-24 DIAGNOSIS — N390 Urinary tract infection, site not specified: Secondary | ICD-10-CM | POA: Diagnosis not present

## 2017-08-24 DIAGNOSIS — R8290 Unspecified abnormal findings in urine: Secondary | ICD-10-CM

## 2017-08-24 LAB — POCT URINALYSIS DIPSTICK
Bilirubin, UA: NEGATIVE
Glucose, UA: NEGATIVE
Ketones, UA: NEGATIVE
Nitrite, UA: POSITIVE
Protein, UA: 30
Spec Grav, UA: 1.025 (ref 1.010–1.025)
Urobilinogen, UA: 0.2 E.U./dL
pH, UA: 5 (ref 5.0–8.0)

## 2017-08-24 MED ORDER — CIPROFLOXACIN HCL 500 MG PO TABS: 500.0000 mg | ORAL_TABLET | Freq: Two times a day (BID) | ORAL | 0 refills | Status: DC

## 2017-08-24 NOTE — Progress Notes (Signed)
**Note De-Identified Cynthia Obfuscation** [name] Female    DOB: 07-04-1950   67 y.o.   MRN: [id number] Visit Date: 08/24/2017  Today's Provider: [name], PA   Chief Complaint  Patient presents with  . Urinary Tract Infection   Subjective:    Urinary Tract Infection   This is a recurrent problem. The current episode started yesterday. The problem occurs every urination. The problem has been unchanged. Associated symptoms comments: Cloudy and foul odor urine . She has tried nothing for the symptoms. atony of bladder   Patient Active Problem List   Diagnosis Date Noted  . Can't get food down 09/13/2015  . ALT (SGPT) level raised 09/13/2015  . Acid reflux 09/13/2015  . H/O adenomatous polyp of colon 09/13/2015  . Anxiety 01/14/2015  . Hypertension 01/14/2015  . Chronic kidney disease (CKD), stage III (moderate) ([place]) 01/13/2015  . Cannot sleep 02/26/2009  . Anxiety state 02/26/2009  . 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 Diabetes ([place]) 12/14/2005  . 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 Menopausal symptom 12/14/2005   Past Surgical History:  Procedure Laterality Date  . CESAREAN SECTION  1983  . CESAREAN SECTION    . CHOLECYSTECTOMY    . COLONOSCOPY WITH PROPOFOL N/A 10/29/2015   Procedure: COLONOSCOPY WITH PROPOFOL;  Surgeon: Manya Silvas, MD;  Location: [facility] ENDOSCOPY;  Service: Endoscopy;  Laterality: N/A;  . DILATION AND CURETTAGE OF UTERUS  2001  . ESOPHAGOGASTRODUODENOSCOPY (EGD) WITH PROPOFOL N/A 10/29/2015   Procedure: ESOPHAGOGASTRODUODENOSCOPY (EGD) WITH PROPOFOL;  Surgeon: Manya Silvas, MD;  Location: [facility] ENDOSCOPY;  Service: Endoscopy;  Laterality: N/A;   Past Medical History:  Diagnosis Date  . ALT (SGPT) level raised   . Anemia   . Anxiety   . Arthritis   . Diabetes mellitus without complication ([place])   . Elevated  cholesterol   . GERD (gastroesophageal reflux disease)   . Hemorrhoids   . Hypertension   . Kidney disease    Family History  Problem Relation Age of Onset  . Alzheimer's disease Mother   . COPD Mother   . Breast cancer Mother 35  . Osteoporosis Mother   . Heart attack Father   . Lupus Father   . Diabetes Father   . Hypertension Father   . Diabetes Sister   . COPD Maternal Grandmother   . Diabetes Paternal Grandfather   . Diabetes Sister   . Diabetes Sister    No Known Allergies  Current Outpatient Medications:  .  aspirin EC 81 MG tablet, Take 81 mg by mouth daily. , Disp: , Rfl:  .  citalopram (CELEXA) 20 MG tablet, Take 1.5 tablets (30 mg total) by mouth at bedtime., Disp: 135 tablet, Rfl: 1 .  Cranberry 125 MG TABS, Take 1 tablet by mouth daily., Disp: , Rfl:  .  ferrous sulfate 325 (65 FE) MG tablet, Take 1 tablet by mouth daily., Disp: , Rfl:  .  glipiZIDE (GLUCOTROL) 10 MG tablet, Take 1 tablet (10 mg total) by mouth 2 (two) times daily., Disp: 180 tablet, Rfl: 3 .  glucose blood test strip, Use as instructed to check fasting blood sugar once daily., Disp: 100 each, Rfl: 12 .  Lancets (ONETOUCH ULTRASOFT) lancets, Use as instructed to test fasting blood sugar daily., Disp: 100 each, Rfl: 12 .  lisinopril-hydrochlorothiazide (PRINZIDE,ZESTORETIC) 10-12.5 MG tablet,  Take 1 tablet by mouth daily., Disp: 90 tablet, Rfl: 3 .  lovastatin (MEVACOR) 40 MG tablet, Take 1 tablet (40 mg total) by mouth daily., Disp: 90 tablet, Rfl: 3 .  metFORMIN (GLUCOPHAGE) 500 MG tablet, Take 1 tablet (500 mg total) by mouth 2 (two) times daily with a meal., Disp: 180 tablet, Rfl: 3 .  methocarbamol (ROBAXIN) 500 MG tablet, Take 1 tablet (500 mg total) by mouth 4 (four) times daily., Disp: 30 tablet, Rfl: 0 .  omeprazole (PRILOSEC) 40 MG capsule, Take 40 mg by mouth daily. , Disp: , Rfl:  .  ranitidine (ZANTAC) 150 MG tablet, Take 150 mg by mouth at bedtime., Disp: , Rfl:   Review of Systems    Constitutional: Negative.   Respiratory: Negative.   Cardiovascular: Negative.   Genitourinary:       Cloudy urine and foul odor of urine    Social History   Tobacco Use  . Smoking status: Never Smoker  . Smokeless tobacco: Never Used  Substance Use Topics  . Alcohol use: No    Alcohol/week: 0.0 oz   Objective:   BP 116/64 (BP Location: Right Arm, Patient Position: Sitting, Cuff Size: Normal)   Pulse 69   Temp 98.3 F (36.8 C) (Oral)   SpO2 99%   Physical Exam  Constitutional: She is oriented to person, place, and time. She appears well-developed and well-nourished. No distress.  HENT:  Head: Normocephalic and atraumatic.  Right Ear: Hearing normal.  Left Ear: Hearing normal.  Nose: Nose normal.  Eyes: Conjunctivae and lids are normal. Right eye exhibits no discharge. Left eye exhibits no discharge. No scleral icterus.  Cardiovascular: Normal rate.  Pulmonary/Chest: Effort normal. No respiratory distress.  Abdominal: Soft. Bowel sounds are normal.  Musculoskeletal: Normal range of motion.  Neurological: She is alert and oriented to person, place, and time.  Skin: Skin is intact. No lesion and no rash noted.  Psychiatric: She has a normal mood and affect. Her speech is normal and behavior is normal. Thought content normal.      Assessment & Plan:     1. Abnormal urine odor Onset yesterday. With bladder atony, she does not have discomfort or frequency. No gross blood in urine. Urinalysis shows pyuria with bacteria. Will treat with different antibiotic and recheck culture. - POCT Urinalysis Dipstick  2. Atony of bladder Has had to use a catheter 3-4 times a day for the past 20 years. Has had evaluation by multiple urologists (last evaluation by Dr. Sharlett Iles at [facility] 09-18-14).  3. Recurrent UTI Treated for Klebsiella UTI 01-09-17 and 08-02-17. Finished Augmentin 08-12-17 and felt better. Recurrence of cloudy foul smelling urine started today. Will changed to  Cipro (have her monitor BS for any hypoglycemia) and recheck culture. Should recheck in 8-9 days before finishing antibiotic to check for need of prolonged use of antibiotic or need for recheck with urologist. - ciprofloxacin (CIPRO) 500 MG tablet; Take 1 tablet (500 mg total) by mouth 2 (two) times daily.  Dispense: 20 tablet; Refill: 0 - Urine Culture       [name], PA  [facility]

## 2017-08-27 ENCOUNTER — Other Ambulatory Visit: Payer: Self-pay | Admitting: Family Medicine

## 2017-08-27 DIAGNOSIS — R69 Illness, unspecified: Secondary | ICD-10-CM | POA: Diagnosis not present

## 2017-08-27 LAB — URINE CULTURE

## 2017-08-28 ENCOUNTER — Other Ambulatory Visit: Payer: Self-pay

## 2017-08-28 DIAGNOSIS — N312 Flaccid neuropathic bladder, not elsewhere classified: Secondary | ICD-10-CM | POA: Diagnosis not present

## 2017-08-28 DIAGNOSIS — R8290 Unspecified abnormal findings in urine: Secondary | ICD-10-CM

## 2017-08-28 DIAGNOSIS — N390 Urinary tract infection, site not specified: Secondary | ICD-10-CM

## 2017-08-28 NOTE — Progress Notes (Signed)
Patient stopped by the office to give repeat urine sample. Urine culture ordered.

## 2017-08-30 LAB — URINE CULTURE: Organism ID, Bacteria: NO GROWTH

## 2017-09-03 ENCOUNTER — Encounter: Payer: Self-pay | Admitting: Family Medicine

## 2017-09-03 ENCOUNTER — Ambulatory Visit (INDEPENDENT_AMBULATORY_CARE_PROVIDER_SITE_OTHER): Payer: Medicare HMO | Admitting: Family Medicine

## 2017-09-03 VITALS — BP 120/60 | HR 80 | Temp 98.3°F | Wt 187.2 lb

## 2017-09-03 DIAGNOSIS — N312 Flaccid neuropathic bladder, not elsewhere classified: Secondary | ICD-10-CM | POA: Diagnosis not present

## 2017-09-03 DIAGNOSIS — N390 Urinary tract infection, site not specified: Secondary | ICD-10-CM | POA: Diagnosis not present

## 2017-09-03 LAB — POCT URINALYSIS DIPSTICK
Bilirubin, UA: NEGATIVE
Blood, UA: NEGATIVE
Glucose, UA: NEGATIVE
Ketones, UA: NEGATIVE
Leukocytes, UA: NEGATIVE
Nitrite, UA: NEGATIVE
Protein, UA: NEGATIVE
Spec Grav, UA: >=1.03 — AB (ref 1.010–1.025)
Urobilinogen, UA: 0.2 E.U./dL
pH, UA: 5 (ref 5.0–8.0)

## 2017-09-03 MED ORDER — CIPROFLOXACIN HCL 500 MG PO TABS: 500.0000 mg | ORAL_TABLET | Freq: Two times a day (BID) | ORAL | 0 refills | Status: DC

## 2017-09-03 NOTE — Progress Notes (Signed)
[name] Female    DOB: February 27, 1951   67 y.o.   MRN: [id number] Visit Date: 09/03/2017  Today's Provider: [name], PA   Chief Complaint  Patient presents with  . Urinary Tract Infection   Subjective:    HPI Recurrent UTI and Abnormal urine odor:  Patient presents today for a follow up. Last OV was on 08/24/17. Changed antibiotic to Cipro 500 mg BID. Urine culture done on 08/28/17 showed no bacterial growth. Patient advised to continue antibiotic and follow up if needed for possible urology referral. Patient states symptoms are only slightly improved. She finished Cipro yesterday.   Patient Active Problem List   Diagnosis Date Noted  . Can't get food down 09/13/2015  . ALT (SGPT) level raised 09/13/2015  . Acid reflux 09/13/2015  . H/O adenomatous polyp of colon 09/13/2015  . Anxiety 01/14/2015  . Hypertension 01/14/2015  . Chronic kidney disease (CKD), stage III (moderate) ([place]) 01/13/2015  . Cannot sleep 02/26/2009  . Anxiety state 02/26/2009  . 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 Diabetes ([place]) 12/14/2005  . 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 Menopausal symptom 12/14/2005   Past Medical History:  Diagnosis Date  . ALT (SGPT) level raised   . Anemia   . Anxiety   . Arthritis   . Diabetes mellitus without complication ([place])   . Elevated cholesterol   . GERD (gastroesophageal reflux disease)   . Hemorrhoids   . Hypertension   . Kidney disease    Past Surgical History:  Procedure Laterality Date  . CESAREAN SECTION  1983  . CESAREAN SECTION    . CHOLECYSTECTOMY    . COLONOSCOPY WITH PROPOFOL N/A 10/29/2015   Procedure: COLONOSCOPY WITH PROPOFOL;  Surgeon: Manya Silvas, MD;  Location[facility] ENDOSCOPY;  Service: Endoscopy;  Laterality: N/A;  . DILATION AND CURETTAGE OF UTERUS  2001  .  ESOPHAGOGASTRODUODENOSCOPY (EGD) WITH PROPOFOL N/A 10/29/2015   Procedure: ESOPHAGOGASTRODUODENOSCOPY (EGD) WITH PROPOFOL;  Surgeon: Manya Silvas, MD;  Location: [facility] ENDOSCOPY;  Service: Endoscopy;  Laterality: N/A;   Family History  Problem Relation Age of Onset  . Alzheimer's disease Mother   . COPD Mother   . Breast cancer Mother 77  . Osteoporosis Mother   . Heart attack Father   . Lupus Father   . Diabetes Father   . Hypertension Father   . Diabetes Sister   . COPD Maternal Grandmother   . Diabetes Paternal Grandfather   . Diabetes Sister   . Diabetes Sister    No Known Allergies  Current Outpatient Medications:  .  aspirin EC 81 MG tablet, Take 81 mg by mouth daily. , Disp: , Rfl:  .  citalopram (CELEXA) 20 MG tablet, Take 1.5 tablets (30 mg total) by mouth at bedtime., Disp: 135 tablet, Rfl: 1 .  Cranberry 125 MG TABS, Take 1 tablet by mouth daily., Disp: , Rfl:  .  ferrous sulfate 325 (65 FE) MG tablet, Take 1 tablet by mouth daily., Disp: , Rfl:  .  glipiZIDE (GLUCOTROL) 10 MG tablet, Take 1 tablet (10 mg total) by mouth 2 (two) times daily., Disp: 180 tablet, Rfl: 3 .  glucose blood (ONE TOUCH ULTRA TEST) test strip, USE ONE STRIP TO CHECK GLUCOSE ONCE DAILY, Disp: 90 each, Rfl: 3 .  Lancets (ONETOUCH ULTRASOFT) lancets, Use as  instructed to test fasting blood sugar daily., Disp: 100 each, Rfl: 12 .  lisinopril-hydrochlorothiazide (PRINZIDE,ZESTORETIC) 10-12.5 MG tablet, Take 1 tablet by mouth daily., Disp: 90 tablet, Rfl: 3 .  lovastatin (MEVACOR) 40 MG tablet, Take 1 tablet (40 mg total) by mouth daily., Disp: 90 tablet, Rfl: 3 .  metFORMIN (GLUCOPHAGE) 500 MG tablet, Take 1 tablet (500 mg total) by mouth 2 (two) times daily with a meal., Disp: 180 tablet, Rfl: 3 .  methocarbamol (ROBAXIN) 500 MG tablet, Take 1 tablet (500 mg total) by mouth 4 (four) times daily., Disp: 30 tablet, Rfl: 0 .  omeprazole (PRILOSEC) 40 MG capsule, Take 40 mg by mouth daily. , Disp: , Rfl:   .  ranitidine (ZANTAC) 150 MG tablet, Take 150 mg by mouth at bedtime., Disp: , Rfl:   Review of Systems  Constitutional: Negative.   Respiratory: Negative.   Cardiovascular: Negative.   Genitourinary:       Cloudy urine and abnormal odor of urine clearing up.   Social History   Tobacco Use  . Smoking status: Never Smoker  . Smokeless tobacco: Never Used  Substance Use Topics  . Alcohol use: No    Alcohol/week: 0.0 oz   Objective:   BP 120/60 (BP Location: Right Arm, Patient Position: Sitting, Cuff Size: Normal)   Pulse 80   Temp 98.3 F (36.8 C) (Oral)   Wt 187 lb 3.2 oz (84.9 kg)   SpO2 98%   BMI 35.37 kg/m   Physical Exam  Constitutional: She is oriented to person, place, and time. She appears well-developed and well-nourished. No distress.  HENT:  Head: Normocephalic and atraumatic.  Right Ear: Hearing normal.  Left Ear: Hearing normal.  Nose: Nose normal.  Eyes: Conjunctivae and lids are normal. Right eye exhibits no discharge. Left eye exhibits no discharge. No scleral icterus.  Cardiovascular: Normal rate.  Pulmonary/Chest: Effort normal and breath sounds normal. No respiratory distress.  Abdominal: Soft. Bowel sounds are normal.  Musculoskeletal: Normal range of motion.  Neurological: She is alert and oriented to person, place, and time.  Skin: Skin is intact. No lesion and no rash noted.  Psychiatric: She has a normal mood and affect. Her speech is normal and behavior is normal. Thought content normal.      Assessment & Plan:      1. Recurrent UTI Culture on 08-02-17 isolated Klebsiella and treated initially with Augmentin. Cloudy urine with abnormal odor continued and changed to Cipro on 08-24-17. Finished this yesterday and symptoms are abated. Urinalysis only shows 2-4 WBC's per HPF without significant bacteria. Will continue Cipro 5 more days and keep a reserve of 5 days if symptoms return. Recheck C&S for cure. - POCT Urinalysis Dipstick - Urine  Culture - ciprofloxacin (CIPRO) 500 MG tablet; Take 1 tablet (500 mg total) by mouth 2 (two) times daily.  Dispense: 20 tablet; Refill: 0  2. Atony of bladder Drinking extra fluids and continues to use insertion catheter kit three times a day for permanent retention and recurrent UTI's. - POCT Urinalysis Dipstick       [name], PA  [facility]

## 2017-09-05 LAB — URINE CULTURE: Organism ID, Bacteria: NO GROWTH

## 2017-10-11 DIAGNOSIS — R197 Diarrhea, unspecified: Secondary | ICD-10-CM | POA: Diagnosis not present

## 2017-10-12 ENCOUNTER — Other Ambulatory Visit
Admission: RE | Admit: 2017-10-12 | Discharge: 2017-10-12 | Disposition: A | Discharge status: Home / Self Care | Payer: Medicare HMO | Source: Ambulatory Visit | Attending: Nurse Practitioner | Admitting: Nurse Practitioner

## 2017-10-12 DIAGNOSIS — N319 Neuromuscular dysfunction of bladder, unspecified: Secondary | ICD-10-CM | POA: Diagnosis not present

## 2017-10-12 DIAGNOSIS — R197 Diarrhea, unspecified: Secondary | ICD-10-CM | POA: Diagnosis present

## 2017-10-12 DIAGNOSIS — N390 Urinary tract infection, site not specified: Secondary | ICD-10-CM | POA: Diagnosis not present

## 2017-10-12 DIAGNOSIS — R339 Retention of urine, unspecified: Secondary | ICD-10-CM | POA: Diagnosis not present

## 2017-10-12 DIAGNOSIS — N312 Flaccid neuropathic bladder, not elsewhere classified: Secondary | ICD-10-CM | POA: Diagnosis not present

## 2017-10-12 LAB — C DIFFICILE QUICK SCREEN W PCR REFLEX
C Diff antigen: NEGATIVE
C Diff interpretation: NOT DETECTED
C Diff toxin: NEGATIVE

## 2017-10-12 LAB — GASTROINTESTINAL PANEL BY PCR, STOOL (REPLACES STOOL CULTURE)

## 2017-10-17 ENCOUNTER — Other Ambulatory Visit: Payer: Self-pay | Admitting: Nurse Practitioner

## 2017-10-17 DIAGNOSIS — R7989 Other specified abnormal findings of blood chemistry: Secondary | ICD-10-CM | POA: Diagnosis not present

## 2017-10-17 DIAGNOSIS — D649 Anemia, unspecified: Secondary | ICD-10-CM | POA: Diagnosis not present

## 2017-10-17 DIAGNOSIS — R1084 Generalized abdominal pain: Secondary | ICD-10-CM

## 2017-10-17 DIAGNOSIS — R197 Diarrhea, unspecified: Secondary | ICD-10-CM

## 2017-10-18 ENCOUNTER — Ambulatory Visit
Admission: RE | Admit: 2017-10-18 | Discharge: 2017-10-18 | Disposition: A | Discharge status: Home / Self Care | Payer: Medicare HMO | Source: Ambulatory Visit | Attending: Nurse Practitioner | Admitting: Nurse Practitioner

## 2017-10-18 DIAGNOSIS — R197 Diarrhea, unspecified: Secondary | ICD-10-CM | POA: Insufficient documentation

## 2017-10-18 DIAGNOSIS — R1111 Vomiting without nausea: Secondary | ICD-10-CM | POA: Diagnosis not present

## 2017-10-18 DIAGNOSIS — R1084 Generalized abdominal pain: Secondary | ICD-10-CM | POA: Insufficient documentation

## 2017-10-25 DIAGNOSIS — R197 Diarrhea, unspecified: Secondary | ICD-10-CM | POA: Diagnosis not present

## 2017-10-29 ENCOUNTER — Other Ambulatory Visit
Admission: RE | Admit: 2017-10-29 | Discharge: 2017-10-29 | Disposition: A | Discharge status: Home / Self Care | Payer: Medicare HMO | Source: Ambulatory Visit | Attending: Nurse Practitioner | Admitting: Nurse Practitioner

## 2017-10-29 DIAGNOSIS — R197 Diarrhea, unspecified: Secondary | ICD-10-CM | POA: Insufficient documentation

## 2017-11-01 LAB — CALPROTECTIN, FECAL: Calprotectin, Fecal: 38 ug/g (ref 0–120)

## 2017-11-02 LAB — PANCREATIC ELASTASE, FECAL: Pancreatic Elastase-1, Stool: >500 ug Elast./g (ref >200)

## 2017-11-07 ENCOUNTER — Ambulatory Visit
Admission: RE | Admit: 2017-11-07 | Discharge: 2017-11-07 | Disposition: A | Discharge status: Home / Self Care | Payer: Medicare HMO | Source: Ambulatory Visit | Attending: Internal Medicine | Admitting: Internal Medicine

## 2017-11-07 ENCOUNTER — Ambulatory Visit: Payer: Medicare HMO | Admitting: Anesthesiology

## 2017-11-07 ENCOUNTER — Encounter
Admission: RE | Disposition: A | Discharge status: Home / Self Care | Payer: Self-pay | Source: Ambulatory Visit | Attending: Internal Medicine

## 2017-11-07 ENCOUNTER — Encounter: Payer: Self-pay | Admitting: *Deleted

## 2017-11-07 DIAGNOSIS — Z7984 Long term (current) use of oral hypoglycemic drugs: Secondary | ICD-10-CM | POA: Insufficient documentation

## 2017-11-07 DIAGNOSIS — I10 Essential (primary) hypertension: Secondary | ICD-10-CM | POA: Diagnosis not present

## 2017-11-07 DIAGNOSIS — Z7982 Long term (current) use of aspirin: Secondary | ICD-10-CM | POA: Diagnosis not present

## 2017-11-07 DIAGNOSIS — I129 Hypertensive chronic kidney disease with stage 1 through stage 4 chronic kidney disease, or unspecified chronic kidney disease: Secondary | ICD-10-CM | POA: Diagnosis not present

## 2017-11-07 DIAGNOSIS — K591 Functional diarrhea: Secondary | ICD-10-CM | POA: Insufficient documentation

## 2017-11-07 DIAGNOSIS — E7800 Pure hypercholesterolemia, unspecified: Secondary | ICD-10-CM | POA: Insufficient documentation

## 2017-11-07 DIAGNOSIS — E1122 Type 2 diabetes mellitus with diabetic chronic kidney disease: Secondary | ICD-10-CM | POA: Diagnosis not present

## 2017-11-07 DIAGNOSIS — F419 Anxiety disorder, unspecified: Secondary | ICD-10-CM | POA: Insufficient documentation

## 2017-11-07 DIAGNOSIS — E119 Type 2 diabetes mellitus without complications: Secondary | ICD-10-CM | POA: Insufficient documentation

## 2017-11-07 DIAGNOSIS — K648 Other hemorrhoids: Secondary | ICD-10-CM | POA: Diagnosis not present

## 2017-11-07 DIAGNOSIS — G4730 Sleep apnea, unspecified: Secondary | ICD-10-CM | POA: Diagnosis not present

## 2017-11-07 DIAGNOSIS — Z79899 Other long term (current) drug therapy: Secondary | ICD-10-CM | POA: Diagnosis not present

## 2017-11-07 DIAGNOSIS — K219 Gastro-esophageal reflux disease without esophagitis: Secondary | ICD-10-CM | POA: Diagnosis not present

## 2017-11-07 DIAGNOSIS — R197 Diarrhea, unspecified: Secondary | ICD-10-CM | POA: Diagnosis not present

## 2017-11-07 DIAGNOSIS — K640 First degree hemorrhoids: Secondary | ICD-10-CM | POA: Diagnosis not present

## 2017-11-07 DIAGNOSIS — R69 Illness, unspecified: Secondary | ICD-10-CM | POA: Diagnosis not present

## 2017-11-07 DIAGNOSIS — N183 Chronic kidney disease, stage 3 (moderate): Secondary | ICD-10-CM | POA: Diagnosis not present

## 2017-11-07 DIAGNOSIS — E782 Mixed hyperlipidemia: Secondary | ICD-10-CM | POA: Diagnosis not present

## 2017-11-07 HISTORY — PX: COLONOSCOPY WITH PROPOFOL: SHX5780

## 2017-11-07 LAB — GLUCOSE, CAPILLARY: Glucose-Capillary: 99 mg/dL (ref 65–99)

## 2017-11-07 SURGERY — COLONOSCOPY WITH PROPOFOL
Anesthesia: General

## 2017-11-07 MED ORDER — PROPOFOL 500 MG/50ML IV EMUL
INTRAVENOUS | Status: AC
  Filled 2017-11-07: qty 50

## 2017-11-07 MED ORDER — PROPOFOL 10 MG/ML IV BOLUS
INTRAVENOUS | Status: AC
  Filled 2017-11-07: qty 40

## 2017-11-07 MED ORDER — SODIUM CHLORIDE 0.9 % IV SOLN
INTRAVENOUS | Status: DC
  Administered 2017-11-07: 1000 mL via INTRAVENOUS

## 2017-11-07 MED ORDER — PROPOFOL 10 MG/ML IV BOLUS
INTRAVENOUS | Status: DC | PRN
  Administered 2017-11-07: 70 mg via INTRAVENOUS

## 2017-11-07 MED ORDER — PROPOFOL 500 MG/50ML IV EMUL
INTRAVENOUS | Status: DC | PRN
  Administered 2017-11-07: 150 ug/kg/min via INTRAVENOUS

## 2017-11-07 MED ORDER — LIDOCAINE HCL (CARDIAC) PF 100 MG/5ML IV SOSY
PREFILLED_SYRINGE | INTRAVENOUS | Status: DC | PRN
  Administered 2017-11-07: 80 mg via INTRAVENOUS

## 2017-11-07 NOTE — Anesthesia Preprocedure Evaluation (Signed)
Anesthesia Evaluation  Patient identified by MRN, date of birth, ID band Patient awake    Reviewed: Allergy & Precautions, H&P , NPO status , Patient's Chart, lab work & pertinent test results, reviewed documented beta blocker date and time   Airway Mallampati: II   Neck ROM: full    Dental  (+) Poor Dentition   Pulmonary neg pulmonary ROS, sleep apnea ,    Pulmonary exam normal        Cardiovascular Exercise Tolerance: Poor hypertension, On Medications + Peripheral Vascular Disease  negative cardio ROS Normal cardiovascular exam Rhythm:regular Rate:Normal     Neuro/Psych PSYCHIATRIC DISORDERS Anxiety  Neuromuscular disease negative neurological ROS  negative psych ROS   GI/Hepatic negative GI ROS, Neg liver ROS, GERD  ,  Endo/Other  negative endocrine ROSdiabetes  Renal/GU Renal diseasenegative Renal ROS  negative genitourinary   Musculoskeletal   Abdominal   Peds  Hematology negative hematology ROS (+) anemia ,   Anesthesia Other Findings Past Medical History: No date: ALT (SGPT) level raised No date: Anemia No date: Anxiety No date: Arthritis No date: Diabetes mellitus without complication (HCC) No date: Elevated cholesterol No date: GERD (gastroesophageal reflux disease) No date: Hemorrhoids No date: Hypertension No date: Kidney disease Past Surgical History: 1983: CESAREAN SECTION No date: CESAREAN SECTION No date: CHOLECYSTECTOMY 10/29/2015: COLONOSCOPY WITH PROPOFOL; N/A     Comment:  Procedure: COLONOSCOPY WITH PROPOFOL;  Surgeon: Manya Silvas, MD;  Location: [facility] ENDOSCOPY;  Service:               Endoscopy;  Laterality: N/A; 2001: DILATION AND CURETTAGE OF UTERUS 10/29/2015: ESOPHAGOGASTRODUODENOSCOPY (EGD) WITH PROPOFOL; N/A     Comment:  Procedure: ESOPHAGOGASTRODUODENOSCOPY (EGD) WITH               PROPOFOL;  Surgeon: Manya Silvas, MD;  Location: [facility]  ENDOSCOPY;  Service: Endoscopy;  Laterality: N/A; BMI    Body Mass Index:  35.33 kg/m     Reproductive/Obstetrics negative OB ROS                             Anesthesia Physical Anesthesia Plan  ASA: III  Anesthesia Plan: General   Post-op Pain Management:    Induction:   PONV Risk Score and Plan:   Airway Management Planned:   Additional Equipment:   Intra-op Plan:   Post-operative Plan:   Informed Consent: I have reviewed the patients History and Physical, chart, labs and discussed the procedure including the risks, benefits and alternatives for the proposed anesthesia with the patient or authorized representative who has indicated his/her understanding and acceptance.   Dental Advisory Given  Plan Discussed with: CRNA  Anesthesia Plan Comments:         Anesthesia Quick Evaluation

## 2017-11-07 NOTE — Anesthesia Post-op Follow-up Note (Signed)
Anesthesia QCDR form completed.        

## 2017-11-07 NOTE — Interval H&P Note (Signed)
History and Physical Interval Note:  11/07/2017 2:35 PM  Cynthia Sims  has presented today for surgery, with the diagnosis of DIARRHEA  The various methods of treatment have been discussed with the patient and family. After consideration of risks, benefits and other options for treatment, the patient has consented to  Procedure(s): COLONOSCOPY WITH PROPOFOL (N/A) as a surgical intervention .  The patient's history has been reviewed, patient examined, no change in status, stable for surgery.  I have reviewed the patient's chart and labs.  Questions were answered to the patient's satisfaction.     [place], [place]

## 2017-11-07 NOTE — H&P (Signed)
Outpatient short stay form Pre-procedure 11/07/2017 2:30 PM Cynthia Sims, M.D.  Primary Physician: Cynthia Sims, M.D.  Reason for visit: diarrhea, fecal incontinence.personal history of tubular adenomatous polyps.  History of present illness:  Patient is a pleasant 67 year old female that complains of progressive increased frequency of bowel movements along with increased frequency of fecal incontinence for the past 6 months to a year. Patient denies any bleeding or weight loss. Patient has undergone 4 Previous episiotomies after natural childbirth. She is a diabetic and takes metformin, however, she has never had a problem with the medication causing diarrhea in the remote or recent past.biopsies the sigmoid colon in 2017 by Dr. Tedra Sims revealed no evidence of microscopic colitis.    Current Facility-Administered Medications:  .  0.9 %  sodium chloride infusion, , Intravenous, Continuous, Lyman, Benay Pike, MD, Last Rate: 20 mL/hr at 11/07/17 1353, 1,000 mL at 11/07/17 1353  Medications Prior to Admission  Medication Sig Dispense Refill Last Dose  . aspirin EC 81 MG tablet Take 81 mg by mouth daily.    Past Week at Unknown time  . citalopram (CELEXA) 20 MG tablet Take 1.5 tablets (30 mg total) by mouth at bedtime. 135 tablet 1 11/06/2017 at Unknown time  . Cranberry 125 MG TABS Take 1 tablet by mouth daily.   Past Week at Unknown time  . ferrous sulfate 325 (65 FE) MG tablet Take 1 tablet by mouth daily.   Past Week at Unknown time  . glipiZIDE (GLUCOTROL) 10 MG tablet Take 1 tablet (10 mg total) by mouth 2 (two) times daily. 180 tablet 3 11/06/2017 at Unknown time  . glucose blood (ONE TOUCH ULTRA TEST) test strip USE ONE STRIP TO CHECK GLUCOSE ONCE DAILY 90 each 3 11/06/2017 at Unknown time  . Lancets (ONETOUCH ULTRASOFT) lancets Use as instructed to test fasting blood sugar daily. 100 each 12 11/06/2017 at Unknown time  . lisinopril-hydrochlorothiazide (PRINZIDE,ZESTORETIC) 10-12.5 MG  tablet Take 1 tablet by mouth daily. 90 tablet 3 11/06/2017 at Unknown time  . lovastatin (MEVACOR) 40 MG tablet Take 1 tablet (40 mg total) by mouth daily. 90 tablet 3 11/06/2017 at Unknown time  . metFORMIN (GLUCOPHAGE) 500 MG tablet Take 1 tablet (500 mg total) by mouth 2 (two) times daily with a meal. 180 tablet 3 11/06/2017 at Unknown time  . omeprazole (PRILOSEC) 40 MG capsule Take 40 mg by mouth daily.    11/06/2017 at Unknown time  . ranitidine (ZANTAC) 150 MG tablet Take 150 mg by mouth at bedtime.   11/06/2017 at Unknown time  . ciprofloxacin (CIPRO) 500 MG tablet Take 1 tablet (500 mg total) by mouth 2 (two) times daily. (Patient not taking: Reported on 11/07/2017) 20 tablet 0 Not Taking at Unknown time  . methocarbamol (ROBAXIN) 500 MG tablet Take 1 tablet (500 mg total) by mouth 4 (four) times daily. (Patient not taking: Reported on 11/07/2017) 30 tablet 0 Not Taking at Unknown time     No Known Allergies   Past Medical History:  Diagnosis Date  . ALT (SGPT) level raised   . Anemia   . Anxiety   . Arthritis   . Diabetes mellitus without complication ([place])   . Elevated cholesterol   . GERD (gastroesophageal reflux disease)   . Hemorrhoids   . Hypertension   . Kidney disease     Review of systems:      Physical Exam  Gen: Alert, oriented. Appears stated age.  HEENT: [place]/AT. PERRLA. Lungs: CTA, no wheezes.  CV: RR nl S1, S2. Abd: soft, benign, no masses. BS+ Ext: No edema. Pulses 2+    Planned procedures: Proceed with colonoscopy.The patient understands the nature of the planned procedure, indications, risks, alternatives and potential complications including but not limited to bleeding, infection, perforation, damage to internal organs and possible oversedation/side effects from anesthesia. The patient agrees and gives consent to proceed.  Please refer to procedure notes for findings, recommendations and patient disposition/instructions.    Cynthia Sims,  M.D. Gastroenterology 11/07/2017  2:30 PM

## 2017-11-07 NOTE — Op Note (Signed)
[facility] Patient Name: Cynthia Sims Procedure Date: 11/07/2017 2:32 PM MRN: [id number] Account #: [id number] Date of Birth: 1950/08/01 Admit Type: Outpatient Age: 67 Room: [facility] ENDO ROOM 2 Gender: Female Note Status: Finalized Procedure:            Colonoscopy Indications:          Functional diarrhea, Fecal incontinence Providers:            Benay Pike. Alice Reichert MD, MD Referring MD:         Vickki Muff. Chrismon, MD (Referring MD) Medicines:            Propofol per Anesthesia Complications:        No immediate complications. Procedure:            Pre-Anesthesia Assessment:                       - Prior to the procedure, a History and Physical was                        performed, and patient medications, allergies and                        sensitivities were reviewed. The patient's tolerance of                        previous anesthesia was reviewed.                       After obtaining informed consent, the colonoscope was                        passed under direct vision. Throughout the procedure,                        the patient's blood pressure, pulse, and oxygen                        saturations were monitored continuously. The                        Colonoscope was introduced through the anus and                        advanced to the the cecum, identified by appendiceal                        orifice and ileocecal valve. The colonoscopy was                        performed without difficulty. The patient tolerated the                        procedure well. The quality of the bowel preparation                        was adequate. The ileocecal valve, appendiceal orifice,                        and rectum were photographed. Findings:      The digital rectal exam findings include decreased sphincter tone.  Pertinent negatives include no palpable rectal lesions.      Non-bleeding internal hemorrhoids were found during retroflexion.  The       hemorrhoids were Grade I (internal hemorrhoids that do not prolapse).      The exam was otherwise without abnormality.      Biopsies for histology were taken with a cold forceps from the random       colon for evaluation of microscopic colitis. Impression:           - Decreased sphincter tone found on digital rectal exam.                       - Non-bleeding internal hemorrhoids.                       - The examination was otherwise normal.                       - Biopsies were taken with a cold forceps from the                        random colon for evaluation of microscopic colitis. Recommendation:       - Patient has a contact number available for                        emergencies. The signs and symptoms of potential                        delayed complications were discussed with the patient.                        Return to normal activities tomorrow. Written discharge                        instructions were provided to the patient.                       - Resume previous diet.                       - Continue present medications.                       - Await pathology results.                       - Repeat colonoscopy in 5 years for surveillance.                       - Return to nurse practitioner in 3 months.                       - Welchol (colesevelam HCl) 6 tablets PO daily.                       - The findings and recommendations were discussed with                        the patient and their family. Procedure Code(s):    --- Professional ---  45380, Colonoscopy, flexible; with biopsy, single or                        multiple Diagnosis Code(s):    --- Professional ---                       R15.9, Full incontinence of feces                       K59.1, Functional diarrhea                       K64.0, First degree hemorrhoids                       K62.89, Other specified diseases of anus and rectum CPT copyright 2017 American Medical  Association. All rights reserved. The codes documented in this report are preliminary and upon coder review may  be revised to meet current compliance requirements. Efrain Sella MD, MD 11/07/2017 2:58:16 PM This report has been signed electronically. Number of Addenda: 0 Note Initiated On: 11/07/2017 2:32 PM Scope Withdrawal Time: 0 hours 6 minutes 40 seconds  Total Procedure Duration: 0 hours 13 minutes 18 seconds       [facility]

## 2017-11-07 NOTE — Transfer of Care (Signed)
Immediate Anesthesia Transfer of Care Note  [name]  Procedure(s) Performed: COLONOSCOPY WITH PROPOFOL (N/A )  Patient Location: PACU  Anesthesia Type:General  Level of Consciousness: awake, alert  and oriented  Airway & Oxygen Therapy: Patient Spontanous Breathing and Patient connected to nasal cannula oxygen  Post-op Assessment: Report given to RN and Post -op Vital signs reviewed and stable  Post vital signs: Reviewed and stable  Last Vitals:  Vitals Value Taken Time  BP 127/55 11/07/2017  3:07 PM  Temp 36.3 C 11/07/2017  3:07 PM  Pulse 79 11/07/2017  3:07 PM  Resp 16 11/07/2017  3:07 PM  SpO2 94 % 11/07/2017  3:07 PM    Last Pain:  Vitals:   11/07/17 1501  TempSrc: Tympanic  PainSc:          Complications: No apparent anesthesia complications   MDA [place] aware of patient status, possible aspiration.  PACU RN aware and informed to continue to monitor patient and inform [place] of any additional issues.  SPO2 WNL upon completion of care.

## 2017-11-08 ENCOUNTER — Encounter: Payer: Self-pay | Admitting: Internal Medicine

## 2017-11-09 LAB — SURGICAL PATHOLOGY

## 2017-11-12 NOTE — Anesthesia Postprocedure Evaluation (Signed)
Anesthesia Post Note  [name]  Procedure(s) Performed: COLONOSCOPY WITH PROPOFOL (N/A )  Patient location during evaluation: PACU Anesthesia Type: General Level of consciousness: awake and alert Pain management: pain level controlled Vital Signs Assessment: post-procedure vital signs reviewed and stable Respiratory status: spontaneous breathing, nonlabored ventilation, respiratory function stable and patient connected to nasal cannula oxygen Cardiovascular status: blood pressure returned to baseline and stable Postop Assessment: no apparent nausea or vomiting Anesthetic complications: no     Last Vitals:  Vitals:   11/07/17 1520 11/07/17 1530  BP: (!) 140/59 (!) 142/61  Pulse: 69 72  Resp: 18 14  Temp:    SpO2: 100% 99%    Last Pain:  Vitals:   11/08/17 0731  TempSrc:   PainSc: 5                  Molli Barrows

## 2017-11-14 DIAGNOSIS — K591 Functional diarrhea: Secondary | ICD-10-CM | POA: Diagnosis not present

## 2017-11-14 DIAGNOSIS — Z86010 Personal history of colonic polyps: Secondary | ICD-10-CM | POA: Diagnosis not present

## 2017-12-05 ENCOUNTER — Other Ambulatory Visit: Payer: Self-pay | Admitting: Family Medicine

## 2017-12-05 DIAGNOSIS — I10 Essential (primary) hypertension: Secondary | ICD-10-CM

## 2017-12-21 ENCOUNTER — Encounter: Payer: Self-pay | Admitting: Family Medicine

## 2017-12-21 ENCOUNTER — Ambulatory Visit (INDEPENDENT_AMBULATORY_CARE_PROVIDER_SITE_OTHER): Payer: Medicare HMO | Admitting: Family Medicine

## 2017-12-21 ENCOUNTER — Other Ambulatory Visit: Payer: Self-pay | Admitting: Family Medicine

## 2017-12-21 VITALS — BP 106/60 | HR 65 | Temp 98.3°F | Wt 185.6 lb

## 2017-12-21 DIAGNOSIS — N183 Chronic kidney disease, stage 3 unspecified: Secondary | ICD-10-CM

## 2017-12-21 DIAGNOSIS — I10 Essential (primary) hypertension: Secondary | ICD-10-CM | POA: Diagnosis not present

## 2017-12-21 DIAGNOSIS — E782 Mixed hyperlipidemia: Secondary | ICD-10-CM | POA: Diagnosis not present

## 2017-12-21 DIAGNOSIS — R69 Illness, unspecified: Secondary | ICD-10-CM | POA: Diagnosis not present

## 2017-12-21 DIAGNOSIS — M170 Bilateral primary osteoarthritis of knee: Secondary | ICD-10-CM | POA: Diagnosis not present

## 2017-12-21 DIAGNOSIS — E0842 Diabetes mellitus due to underlying condition with diabetic polyneuropathy: Secondary | ICD-10-CM | POA: Diagnosis not present

## 2017-12-21 DIAGNOSIS — F411 Generalized anxiety disorder: Secondary | ICD-10-CM

## 2017-12-21 DIAGNOSIS — N312 Flaccid neuropathic bladder, not elsewhere classified: Secondary | ICD-10-CM
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
## 2017-12-21 NOTE — Patient Instructions (Signed)
Fall Prevention in the Home Falls can cause injuries and can affect people from all age groups. There are many simple things that you can do to make your home safe and to help prevent falls. What can I do on the outside of my home?  Regularly repair the edges of walkways and driveways and fix any cracks.  Remove high doorway thresholds.  Trim any shrubbery on the main path into your home.  Use bright outdoor lighting.  Clear walkways of debris and clutter, including tools and rocks.  Regularly check that handrails are securely fastened and in good repair. Both sides of any steps should have handrails.  Install guardrails along the edges of any raised decks or porches.  Have leaves, snow, and ice cleared regularly.  Use sand or salt on walkways during winter months.  In the garage, clean up any spills right away, including grease or oil spills. What can I do in the bathroom?  Use night lights.  Install grab bars by the toilet and in the tub and shower. Do not use towel bars as grab bars.  Use non-skid mats or decals on the floor of the tub or shower.  If you need to sit down while you are in the shower, use a plastic, non-slip stool.  Keep the floor dry. Immediately clean up any water that spills on the floor.  Remove soap buildup in the tub or shower on a regular basis.  Attach bath mats securely with double-sided non-slip rug tape.  Remove throw rugs and other tripping hazards from the floor. What can I do in the bedroom?  Use night lights.  Make sure that a bedside light is easy to reach.  Do not use oversized bedding that drapes onto the floor.  Have a firm chair that has side arms to use for getting dressed.  Remove throw rugs and other tripping hazards from the floor. What can I do in the kitchen?  Clean up any spills right away.  Avoid walking on wet floors.  Place frequently used items in easy-to-reach places.  If you need to reach for something above  you, use a sturdy step stool that has a grab bar.  Keep electrical cables out of the way.  Do not use floor polish or wax that makes floors slippery. If you have to use wax, make sure that it is non-skid floor wax.  Remove throw rugs and other tripping hazards from the floor. What can I do in the stairways?  Do not leave any items on the stairs.  Make sure that there are handrails on both sides of the stairs. Fix handrails that are broken or loose. Make sure that handrails are as long as the stairways.  Check any carpeting to make sure that it is firmly attached to the stairs. Fix any carpet that is loose or worn.  Avoid having throw rugs at the top or bottom of stairways, or secure the rugs with carpet tape to prevent them from moving.  Make sure that you have a light switch at the top of the stairs and the bottom of the stairs. If you do not have them, have them installed. What are some other fall prevention tips?  Wear closed-toe shoes that fit well and support your feet. Wear shoes that have rubber soles or low heels.  When you use a stepladder, make sure that it is completely opened and that the sides are firmly locked. Have someone hold the ladder while you are using   it. Do not climb a closed stepladder.  Add color or contrast paint or tape to grab bars and handrails in your home. Place contrasting color strips on the first and last steps.  Use mobility aids as needed, such as canes, walkers, scooters, and crutches.  Turn on lights if it is dark. Replace any light bulbs that burn out.  Set up furniture so that there are clear paths. Keep the furniture in the same spot.  Fix any uneven floor surfaces.  Choose a carpet design that does not hide the edge of steps of a stairway.  Be aware of any and all pets.  Review your medicines with your healthcare provider. Some medicines can cause dizziness or changes in blood pressure, which increase your risk of falling. Talk with  your health care provider about other ways that you can decrease your risk of falls. This may include working with a physical therapist or trainer to improve your strength, balance, and endurance. This information is not intended to replace advice given to you by your health care provider. Make sure you discuss any questions you have with your health care provider. Document Released: 05/19/2002 Document Revised: 10/26/2015 Document Reviewed: 07/03/2014 Elsevier Interactive Patient Education  2018 Elsevier Inc.  

## 2017-12-22 LAB — CBC WITH DIFFERENTIAL/PLATELET
Basophils Absolute: 0 10*3/uL (ref 0.0–0.2)
Basos: 0 %
EOS (ABSOLUTE): 0.1 10*3/uL (ref 0.0–0.4)
Eos: 1 %
Hematocrit: 35.5 % (ref 34.0–46.6)
Hemoglobin: 11.8 g/dL (ref 11.1–15.9)
Immature Grans (Abs): 0 10*3/uL (ref 0.0–0.1)
Immature Granulocytes: 0 %
Lymphocytes Absolute: 2.1 10*3/uL (ref 0.7–3.1)
Lymphs: 35 %
MCH: 31.1 pg (ref 26.6–33.0)
MCHC: 33.2 g/dL (ref 31.5–35.7)
MCV: 94 fL (ref 79–97)
Monocytes Absolute: 0.4 10*3/uL (ref 0.1–0.9)
Monocytes: 6 %
Neutrophils Absolute: 3.5 10*3/uL (ref 1.4–7.0)
Neutrophils: 58 %
Platelets: 139 10*3/uL — ABNORMAL LOW (ref 150–450)
RBC: 3.79 x10E6/uL (ref 3.77–5.28)
RDW: 15.3 % (ref 12.3–15.4)
WBC: 6.1 10*3/uL (ref 3.4–10.8)

## 2017-12-22 LAB — COMPREHENSIVE METABOLIC PANEL
ALT: 42 [IU]/L — ABNORMAL HIGH (ref 0–32)
AST: 70 IU/L — ABNORMAL HIGH (ref 0–40)
Albumin/Globulin Ratio: 1.5 (ref 1.2–2.2)
Albumin: 4.1 g/dL (ref 3.6–4.8)
Alkaline Phosphatase: 106 [IU]/L (ref 39–117)
BUN/Creatinine Ratio: 20 (ref 12–28)
BUN: 30 mg/dL — ABNORMAL HIGH (ref 8–27)
Bilirubin Total: 0.3 mg/dL (ref 0.0–1.2)
CO2: 15 mmol/L — ABNORMAL LOW (ref 20–29)
Calcium: 8.9 mg/dL (ref 8.7–10.3)
Chloride: 108 mmol/L — ABNORMAL HIGH (ref 96–106)
Creatinine, Ser: 1.48 mg/dL — ABNORMAL HIGH (ref 0.57–1.00)
GFR calc Af Amer: 41 mL/min/{1.73_m2} — ABNORMAL LOW (ref >59)
GFR calc non Af Amer: 36 mL/min/{1.73_m2} — ABNORMAL LOW (ref >59)
Globulin, Total: 2.8 g/dL (ref 1.5–4.5)
Glucose: 110 mg/dL — ABNORMAL HIGH (ref 65–99)
Potassium: 5.5 mmol/L — ABNORMAL HIGH (ref 3.5–5.2)
Sodium: 138 mmol/L (ref 134–144)
Total Protein: 6.9 g/dL (ref 6.0–8.5)

## 2017-12-22 LAB — LIPID PANEL
Chol/HDL Ratio: 2.8 ratio (ref 0.0–4.4)
Cholesterol, Total: 149 mg/dL (ref 100–199)
HDL: 53 mg/dL (ref >39)
LDL Calculated: 65 mg/dL (ref 0–99)
Triglycerides: 155 mg/dL — ABNORMAL HIGH (ref 0–149)
VLDL Cholesterol Cal: 31 mg/dL (ref 5–40)

## 2017-12-22 LAB — HEMOGLOBIN A1C
Est. average glucose Bld gHb Est-mCnc: 143 mg/dL
Hgb A1c MFr Bld: 6.6 % — ABNORMAL HIGH (ref 4.8–5.6)

## 2017-12-22 LAB — TSH: TSH: 1.49 u[IU]/mL (ref 0.450–4.500)

## 2017-12-23 DIAGNOSIS — R69 Illness, unspecified: Secondary | ICD-10-CM | POA: Diagnosis not present

## 2017-12-26 ENCOUNTER — Telehealth: Payer: Self-pay | Admitting: Family Medicine

## 2017-12-26 ENCOUNTER — Telehealth: Payer: Self-pay

## 2017-12-26 NOTE — Telephone Encounter (Signed)
Pt advised.   Thanks,   -Rual Vermeer  

## 2017-12-26 NOTE — Telephone Encounter (Signed)
Pt called to see if her [facility] forms that were dropped off last week were ready. Pt was advised that Cynthia Sims is out of the office this week. Please advise. Thanks TNP

## 2017-12-26 NOTE — Telephone Encounter (Signed)
-----   Message from Margo Common, [place] sent at 12/24/2017  7:10 PM EDT ----- Blood sugar, Hgb A1C and cholesterol levels in good shape. Kidney function worsening with increase in creatinine and drop in GFR. Potassium a little elevated and should drink extra water in diet. Liver function tests shows some strain. Recheck levels in 3 months. If they continue to worsen, will need nephrology evaluation.

## 2017-12-27 NOTE — Telephone Encounter (Signed)
Patient advised forms are completed at the front desk ready for pick up.

## 2018-01-02 DIAGNOSIS — R69 Illness, unspecified: Secondary | ICD-10-CM | POA: Diagnosis not present

## 2018-01-04 DIAGNOSIS — E785 Hyperlipidemia, unspecified: Secondary | ICD-10-CM | POA: Diagnosis not present

## 2018-01-04 DIAGNOSIS — R69 Illness, unspecified: Secondary | ICD-10-CM | POA: Diagnosis not present

## 2018-01-04 DIAGNOSIS — I10 Essential (primary) hypertension: Secondary | ICD-10-CM | POA: Diagnosis not present

## 2018-01-04 DIAGNOSIS — D649 Anemia, unspecified: Secondary | ICD-10-CM | POA: Diagnosis not present

## 2018-01-04 DIAGNOSIS — K219 Gastro-esophageal reflux disease without esophagitis: Secondary | ICD-10-CM | POA: Diagnosis not present

## 2018-01-04 DIAGNOSIS — Z7982 Long term (current) use of aspirin: Secondary | ICD-10-CM | POA: Diagnosis not present

## 2018-01-04 DIAGNOSIS — E1140 Type 2 diabetes mellitus with diabetic neuropathy, unspecified: Secondary | ICD-10-CM | POA: Diagnosis not present

## 2018-01-04 DIAGNOSIS — Z7984 Long term (current) use of oral hypoglycemic drugs: Secondary | ICD-10-CM | POA: Diagnosis not present

## 2018-01-07 DIAGNOSIS — N319 Neuromuscular dysfunction of bladder, unspecified: Secondary | ICD-10-CM | POA: Diagnosis not present

## 2018-01-07 DIAGNOSIS — N390 Urinary tract infection, site not specified: Secondary | ICD-10-CM | POA: Diagnosis not present

## 2018-01-07 DIAGNOSIS — N312 Flaccid neuropathic bladder, not elsewhere classified: Secondary | ICD-10-CM | POA: Diagnosis not present

## 2018-01-07 DIAGNOSIS — R339 Retention of urine, unspecified: Secondary | ICD-10-CM | POA: Diagnosis not present

## 2018-01-14 DIAGNOSIS — N183 Chronic kidney disease, stage 3 (moderate): Secondary | ICD-10-CM | POA: Diagnosis not present

## 2018-01-14 DIAGNOSIS — R748 Abnormal levels of other serum enzymes: Secondary | ICD-10-CM | POA: Diagnosis not present

## 2018-01-14 DIAGNOSIS — K591 Functional diarrhea: Secondary | ICD-10-CM | POA: Diagnosis not present

## 2018-01-16 ENCOUNTER — Encounter: Payer: Self-pay | Admitting: Family Medicine

## 2018-01-16 ENCOUNTER — Ambulatory Visit (INDEPENDENT_AMBULATORY_CARE_PROVIDER_SITE_OTHER): Payer: Medicare HMO | Admitting: Family Medicine

## 2018-01-16 VITALS — BP 132/70 | HR 78 | Temp 98.0°F | Resp 16 | Wt 186.0 lb

## 2018-01-16 DIAGNOSIS — N312 Flaccid neuropathic bladder, not elsewhere classified: Secondary | ICD-10-CM

## 2018-01-16 DIAGNOSIS — R748 Abnormal levels of other serum enzymes: Secondary | ICD-10-CM | POA: Diagnosis not present

## 2018-01-16 DIAGNOSIS — N3090 Cystitis, unspecified without hematuria: Secondary | ICD-10-CM | POA: Diagnosis not present

## 2018-01-16 DIAGNOSIS — K591 Functional diarrhea: Secondary | ICD-10-CM | POA: Diagnosis not present

## 2018-01-16 DIAGNOSIS — N183 Chronic kidney disease, stage 3 (moderate): Secondary | ICD-10-CM | POA: Diagnosis not present

## 2018-01-16 LAB — POCT URINALYSIS DIPSTICK
Glucose, UA: NEGATIVE
Ketones, UA: NEGATIVE
Nitrite, UA: NEGATIVE
Protein, UA: NEGATIVE
Spec Grav, UA: 1.015 (ref 1.010–1.025)
Urobilinogen, UA: 0.2 E.U./dL
pH, UA: 6 (ref 5.0–8.0)

## 2018-01-16 MED ORDER — SULFAMETHOXAZOLE-TRIMETHOPRIM 800-160 MG PO TABS: 1.0000 | ORAL_TABLET | Freq: Two times a day (BID) | ORAL | 0 refills | Status: AC

## 2018-01-16 NOTE — Progress Notes (Signed)
[name] Female    DOB: 08-May-1951   67 y.o.   MRN: [id number] Visit Date: 01/16/2018  Today's Provider: Lavon Paganini, MD   I, Martha Clan, CMA, am acting as scribe for Lavon Paganini, MD.  Chief Complaint  Patient presents with  . Urinary Tract Infection   Subjective:    Urinary Tract Infection   This is a new problem. Episode onset: x 1 week. The problem has been gradually worsening. The patient is experiencing no pain. There has been no fever. Associated symptoms include frequency and sweats. Pertinent negatives include no chills, discharge, flank pain, hematuria, hesitancy, nausea, urgency or vomiting. She has tried nothing for the symptoms. Her past medical history is significant for catheterization.   She has h/o bladder atony and CKD, in addition to diabetes.  She has had many UTIs previously.  She self- I&O catheterizes 3+ times daily.    No Known Allergies   Current Outpatient Medications:  .  aspirin EC 81 MG tablet, Take 81 mg by mouth daily. , Disp: , Rfl:  .  citalopram (CELEXA) 20 MG tablet, Take 1.5 tablets (30 mg total) by mouth at bedtime., Disp: 135 tablet, Rfl: 1 .  Cranberry 125 MG TABS, Take 1 tablet by mouth daily., Disp: , Rfl:  .  ferrous sulfate 325 (65 FE) MG tablet, Take 1 tablet by mouth daily., Disp: , Rfl:  .  glipiZIDE (GLUCOTROL) 10 MG tablet, Take 1 tablet (10 mg total) by mouth 2 (two) times daily., Disp: 180 tablet, Rfl: 3 .  glucose blood (ONE TOUCH ULTRA TEST) test strip, USE ONE STRIP TO CHECK GLUCOSE ONCE DAILY, Disp: 90 each, Rfl: 3 .  lisinopril-hydrochlorothiazide (PRINZIDE,ZESTORETIC) 10-12.5 MG tablet, TAKE 1 TABLET BY MOUTH ONCE DAILY, Disp: 90 tablet, Rfl: 3 .  lovastatin (MEVACOR) 40 MG tablet, Take 1 tablet (40 mg total) by mouth daily., Disp: 90 tablet, Rfl: 3 .  metFORMIN (GLUCOPHAGE) 500 MG tablet, Take 1 tablet (500 mg total) by mouth 2 (two) times daily with a meal., Disp: 180 tablet, Rfl: 3 .   omeprazole (PRILOSEC) 40 MG capsule, Take 40 mg by mouth daily. , Disp: , Rfl:  .  ONETOUCH DELICA LANCETS 37C MISC, USE ONE  TO CHECK GLUCOSE ONCE DAILY, Disp: 100 each, Rfl: 12 .  sulfamethoxazole-trimethoprim (BACTRIM DS,SEPTRA DS) 800-160 MG tablet, Take 1 tablet by mouth 2 (two) times daily for 7 days., Disp: 14 tablet, Rfl: 0  Review of Systems  Constitutional: Negative.  Negative for chills.  Respiratory: Negative.   Cardiovascular: Negative.   Gastrointestinal: Negative for nausea and vomiting.  Genitourinary: Positive for difficulty urinating and frequency. Negative for flank pain, hematuria, hesitancy and urgency.  Musculoskeletal: Negative for back pain.    Social History   Tobacco Use  . Smoking status: Never Smoker  . Smokeless tobacco: Never Used  Substance Use Topics  . Alcohol use: No    Alcohol/week: 0.0 oz   Objective:   BP 132/70 (BP Location: Left Arm, Patient Position: Sitting, Cuff Size: Normal)   Pulse 78   Temp 98 F (36.7 C) (Oral)   Resp 16   Wt 186 lb (84.4 kg)   SpO2 98%   BMI 35.14 kg/m  Vitals:   01/16/18 1443  BP: 132/70  Pulse: 78  Resp: 16  Temp: 98 F (36.7 C)  TempSrc: Oral  SpO2: 98%  Weight: 186 lb (84.4 kg)     Physical Exam  Constitutional: She  is oriented to person, place, and time. She appears well-developed and well-nourished. No distress.  HENT:  Head: Normocephalic and atraumatic.  Eyes: Conjunctivae are normal.  Neck: Neck supple. No thyromegaly present.  Cardiovascular: Normal rate, regular rhythm, normal heart sounds and intact distal pulses.  No murmur heard. Abdominal: Soft. Bowel sounds are normal. She exhibits no distension. There is tenderness (suprapubic).  No CVAT  Musculoskeletal: She exhibits no edema or deformity.  Lymphadenopathy:    She has no cervical adenopathy.  Neurological: She is alert and oriented to person, place, and time.  Skin: Skin is warm and dry. Capillary refill takes less than 2  seconds. No rash noted.  Psychiatric: She has a normal mood and affect. Her behavior is normal.  Vitals reviewed.   Results for orders placed or performed in visit on 01/16/18  POCT urinalysis dipstick  Result Value Ref Range   Color, UA yellow    Clarity, UA cloudy    Glucose, UA Negative Negative   Bilirubin, UA small    Ketones, UA negative    Spec Grav, UA 1.015 1.010 - 1.025   Blood, UA small hemolyzed    pH, UA 6.0 5.0 - 8.0   Protein, UA Negative Negative   Urobilinogen, UA 0.2 0.2 or 1.0 E.U./dL   Nitrite, UA negative    Leukocytes, UA Moderate (2+) (A) Negative   Appearance cloudy    Odor sulfur     Last Urine culture that grew bacteria was Klebsiella, and previously E Coli.     Assessment & Plan:   1. Cystitis -UA today consistent with likely UTI  -symptoms also consistent with UTI -Given patient self catheterizes multiple times daily and has had multiple UTIs in the past, she is at risk for UTI -Review of previous urine cultures reveals previous Klebsiella and E. coli infections -Both of these have been sensitive to Bactrim, so we will treat with 7-day course of Bactrim -Discussed appropriate sterile technique for catheterizing -Send urine culture and micro return precautions discussed  2. Atony of bladder - complicates patient's UTIs given that she self-caths - may need to consider urology referral in the future if continues to have recurrent UTIs   Meds ordered this encounter  Medications  . sulfamethoxazole-trimethoprim (BACTRIM DS,SEPTRA DS) 800-160 MG tablet    Sig: Take 1 tablet by mouth 2 (two) times daily for 7 days.    Dispense:  14 tablet    Refill:  0     Return if symptoms worsen or fail to improve.   The entirety of the information documented in the History of Present Illness, Review of Systems and Physical Exam were personally obtained by me. Portions of this information were initially documented by Raquel Sarna Ratchford, CMA and reviewed by me  for thoroughness and accuracy.    Virginia Crews, MD, MPH [facility] 01/16/2018 3:35 PM

## 2018-01-16 NOTE — Patient Instructions (Signed)

## 2018-01-17 LAB — URINALYSIS, MICROSCOPIC ONLY
Casts: NONE SEEN /lpf
WBC, UA: >30 /hpf — AB (ref 0–5)

## 2018-01-20 LAB — URINE CULTURE

## 2018-01-21 ENCOUNTER — Telehealth: Payer: Self-pay

## 2018-01-21 NOTE — Telephone Encounter (Signed)
-----   Message from Virginia Crews, MD sent at 01/21/2018  9:42 AM EDT ----- Urine culture confirms UTI from both E. coli and Klebsiella.  Both of these are sensitive to the antibiotic that was prescribed.  The patient is feeling better as she finishes her antibiotic.  Virginia Crews, MD, MPH [facility] 01/21/2018 9:42 AM

## 2018-01-21 NOTE — Telephone Encounter (Signed)
Pt advised.

## 2018-01-24 ENCOUNTER — Other Ambulatory Visit: Payer: Self-pay | Admitting: Family Medicine

## 2018-01-24 DIAGNOSIS — N183 Chronic kidney disease, stage 3 unspecified: Secondary | ICD-10-CM

## 2018-01-24 DIAGNOSIS — E1122 Type 2 diabetes mellitus with diabetic chronic kidney disease: Secondary | ICD-10-CM

## 2018-01-24 DIAGNOSIS — E1142 Type 2 diabetes mellitus with diabetic polyneuropathy: Secondary | ICD-10-CM

## 2018-01-28 ENCOUNTER — Other Ambulatory Visit: Payer: Self-pay | Admitting: Family Medicine

## 2018-01-28 DIAGNOSIS — E1122 Type 2 diabetes mellitus with diabetic chronic kidney disease: Secondary | ICD-10-CM

## 2018-01-28 DIAGNOSIS — E1142 Type 2 diabetes mellitus with diabetic polyneuropathy: Secondary | ICD-10-CM

## 2018-01-28 DIAGNOSIS — N183 Chronic kidney disease, stage 3 unspecified: Secondary | ICD-10-CM

## 2018-01-28 NOTE — Telephone Encounter (Signed)
[facility] pharmacy faxed a refill request for the following medications. Thanks CC  glipiZIDE (GLUCOTROL) 10 MG tablet   metFORMIN (GLUCOPHAGE) 500 MG tablet

## 2018-01-29 MED ORDER — GLIPIZIDE 10 MG PO TABS: 10.0000 mg | ORAL_TABLET | Freq: Two times a day (BID) | ORAL | 3 refills | Status: DC

## 2018-01-29 MED ORDER — METFORMIN HCL 500 MG PO TABS: 500.0000 mg | ORAL_TABLET | Freq: Two times a day (BID) | ORAL | 3 refills | Status: DC

## 2018-02-19 NOTE — Progress Notes (Signed)
[name] Female    DOB: 1950-11-21   68 y.o.   MRN: [id number] Visit Date: 02/20/2018  Today's Provider: Lavon Paganini, MD   Chief Complaint  Patient presents with  . Urinary Tract Infection   Subjective:    I, Tiburcio Pea, CMA, am acting as a scribe for Lavon Paganini, MD.   HPI Urinary Tract Infection   This is a recurrent problem. Episode onset: 1 week ago.  The problem has been gradually worsening. The patient is experiencing lower back pain. There has been no fever. Associated symptoms include frequency and sweats. Pertinent negatives include no chills, discharge, flank pain, hematuria, hesitancy, urgency or vomiting. She has tried nothing for the symptoms. Her past medical history is significant for catheterization,recurrent UTI's and bladder atony.  Left knee pain: She reports swelling and pain of left knee diffusely for the last 2 to 3 weeks.  She does not remember any injury or trauma.  She thinks she may have had episodes similar to this in the past.  She denies any instability.  She has not tried any medications.  Pain is worse with standing from sitting and walking a lot.   No Known Allergies   Current Outpatient Medications:  .  aspirin EC 81 MG tablet, Take 81 mg by mouth daily. , Disp: , Rfl:  .  citalopram (CELEXA) 20 MG tablet, Take 1.5 tablets (30 mg total) by mouth at bedtime., Disp: 135 tablet, Rfl: 1 .  Cranberry 125 MG TABS, Take 1 tablet by mouth daily., Disp: , Rfl:  .  ferrous sulfate 325 (65 FE) MG tablet, Take 1 tablet by mouth daily., Disp: , Rfl:  .  glipiZIDE (GLUCOTROL) 10 MG tablet, Take 1 tablet (10 mg total) by mouth 2 (two) times daily., Disp: 180 tablet, Rfl: 3 .  glucose blood (ONE TOUCH ULTRA TEST) test strip, USE ONE STRIP TO CHECK GLUCOSE ONCE DAILY, Disp: 90 each, Rfl: 3 .  lisinopril-hydrochlorothiazide (PRINZIDE,ZESTORETIC) 10-12.5 MG tablet, TAKE 1 TABLET BY MOUTH ONCE DAILY, Disp: 90 tablet, Rfl: 3 .   lovastatin (MEVACOR) 40 MG tablet, Take 1 tablet (40 mg total) by mouth daily., Disp: 90 tablet, Rfl: 3 .  metFORMIN (GLUCOPHAGE) 500 MG tablet, Take 1 tablet (500 mg total) by mouth 2 (two) times daily with a meal., Disp: 180 tablet, Rfl: 3 .  omeprazole (PRILOSEC) 40 MG capsule, Take 40 mg by mouth daily. , Disp: , Rfl:  .  ONETOUCH DELICA LANCETS 03E MISC, USE ONE  TO CHECK GLUCOSE ONCE DAILY, Disp: 100 each, Rfl: 12  Review of Systems  Constitutional: Negative.   Respiratory: Negative.   Cardiovascular: Negative.   Genitourinary: Negative.   Musculoskeletal: Negative.     Social History   Tobacco Use  . Smoking status: Never Smoker  . Smokeless tobacco: Never Used  Substance Use Topics  . Alcohol use: No    Alcohol/week: 0.0 standard drinks   Objective:   BP 130/72 (BP Location: Right Arm, Patient Position: Sitting, Cuff Size: Large)   Pulse 69   Temp 97.8 F (36.6 C) (Oral)   Wt 185 lb (83.9 kg)   SpO2 99%   BMI 34.96 kg/m  Vitals:   02/20/18 0852  BP: 130/72  Pulse: 69  Temp: 97.8 F (36.6 C)  TempSrc: Oral  SpO2: 99%  Weight: 185 lb (83.9 kg)     Physical Exam  Constitutional: She is oriented to person, place, and time. She appears well-developed  and well-nourished. No distress.  HENT:  Head: Normocephalic and atraumatic.  Eyes: Conjunctivae are normal. No scleral icterus.  Cardiovascular: Normal rate, regular rhythm, normal heart sounds and intact distal pulses.  No murmur heard. Pulmonary/Chest: Effort normal and breath sounds normal. No respiratory distress. She has no wheezes. She has no rales.  Abdominal: Soft. She exhibits no distension. There is no tenderness. There is no CVA tenderness.  Musculoskeletal: She exhibits no edema.       Left knee: She exhibits swelling. She exhibits normal range of motion, no deformity, no erythema, no LCL laxity, no bony tenderness, normal meniscus and no MCL laxity. Tenderness found. Medial joint line tenderness  noted. No lateral joint line, no MCL, no LCL and no patellar tendon tenderness noted.  Neurological: She is alert and oriented to person, place, and time.  Skin: Skin is warm and dry. Capillary refill takes less than 2 seconds. No rash noted.  Psychiatric: She has a normal mood and affect. Her behavior is normal.  Vitals reviewed.    Results for orders placed or performed in visit on 02/20/18  POCT Urinalysis Dipstick  Result Value Ref Range   Color, UA dark yellow    Clarity, UA cloudy    Glucose, UA Negative Negative   Bilirubin, UA negative    Ketones, UA negative    Spec Grav, UA 1.020 1.010 - 1.025   Blood, UA hemolyzed trace    pH, UA 6.0 5.0 - 8.0   Protein, UA Positive (A) Negative   Urobilinogen, UA 0.2 0.2 or 1.0 E.U./dL   Nitrite, UA positive    Leukocytes, UA Moderate (2+) (A) Negative   Appearance     Odor         Assessment & Plan:   1. Cystitis 2. Bladder atony 3. Recurrent UTI -UA today consistent with likely UTI, and symptoms are consistent as well -Given that patient self catheterizes multiple times per day and has had multiple UTIs in the past, she is at high risk for UTI -Review of previous urine culture reveals previous Klebsiella and E. coli infections - Both of these bacteria have been sensitive to Bactrim, so we will treat with 7-day course of Bactrim - Discussed appropriate sterile technique for catheterization -Send urine culture to confirm sensitivities - Return precautions discussed -No signs or symptoms of systemic infection at this time - Referral to urology given that she is having recurrent UTIs and may benefit from prophylactic antibiotics - Ambulatory referral to Urology - Urine Culture  4. Acute pain of left knee -New problem - Exam is fairly benign with no evidence of meniscal or ligamental injury -Given crepitus on exam, suspect that pain and swelling is related to flare of arthritis - We will hold off on x-raying at this  time -Discussed conservative management with rest, ice, elevation -Discussed importance of quad strengthening and discussed home exercise program -Could consider x-rays, formal physical therapy, and/or cortisone injections in the future    Meds ordered this encounter  Medications  . sulfamethoxazole-trimethoprim (BACTRIM DS,SEPTRA DS) 800-160 MG tablet    Sig: Take 1 tablet by mouth 2 (two) times daily for 7 days.    Dispense:  14 tablet    Refill:  0     Return if symptoms worsen or fail to improve.   The entirety of the information documented in the History of Present Illness, Review of Systems and Physical Exam were personally obtained by me. Portions of this information were initially documented by  Tiburcio Pea, CMA and reviewed by me for thoroughness and accuracy.    Virginia Crews, MD, MPH [facility] 02/20/2018 10:28 AM

## 2018-02-20 ENCOUNTER — Encounter: Payer: Self-pay | Admitting: Family Medicine

## 2018-02-20 ENCOUNTER — Ambulatory Visit (INDEPENDENT_AMBULATORY_CARE_PROVIDER_SITE_OTHER): Payer: Medicare HMO | Admitting: Family Medicine

## 2018-02-20 VITALS — BP 130/72 | HR 69 | Temp 97.8°F | Wt 185.0 lb

## 2018-02-20 DIAGNOSIS — N3090 Cystitis, unspecified without hematuria: Secondary | ICD-10-CM

## 2018-02-20 DIAGNOSIS — N312 Flaccid neuropathic bladder, not elsewhere classified: Secondary | ICD-10-CM

## 2018-02-20 DIAGNOSIS — N390 Urinary tract infection, site not specified: Secondary | ICD-10-CM | POA: Diagnosis not present

## 2018-02-20 DIAGNOSIS — M25562 Pain in left knee: Secondary | ICD-10-CM

## 2018-02-20 DIAGNOSIS — R69 Illness, unspecified: Secondary | ICD-10-CM | POA: Diagnosis not present

## 2018-02-20 LAB — POCT URINALYSIS DIPSTICK
Bilirubin, UA: NEGATIVE
Glucose, UA: NEGATIVE
Ketones, UA: NEGATIVE
Nitrite, UA: POSITIVE
Protein, UA: POSITIVE — AB
Spec Grav, UA: 1.02 (ref 1.010–1.025)
Urobilinogen, UA: 0.2 E.U./dL
pH, UA: 6 (ref 5.0–8.0)

## 2018-02-20 MED ORDER — SULFAMETHOXAZOLE-TRIMETHOPRIM 800-160 MG PO TABS: 1.0000 | ORAL_TABLET | Freq: Two times a day (BID) | ORAL | 0 refills | Status: AC

## 2018-02-20 NOTE — Patient Instructions (Signed)

## 2018-02-23 LAB — URINE CULTURE

## 2018-02-26 ENCOUNTER — Encounter: Payer: Self-pay | Admitting: Family Medicine

## 2018-02-26 ENCOUNTER — Ambulatory Visit (INDEPENDENT_AMBULATORY_CARE_PROVIDER_SITE_OTHER): Payer: Medicare HMO | Admitting: Family Medicine

## 2018-02-26 VITALS — BP 138/77 | HR 84 | Temp 98.1°F | Resp 16 | Wt 186.0 lb

## 2018-02-26 DIAGNOSIS — R69 Illness, unspecified: Secondary | ICD-10-CM | POA: Diagnosis not present

## 2018-02-26 DIAGNOSIS — N312 Flaccid neuropathic bladder, not elsewhere classified: Secondary | ICD-10-CM | POA: Diagnosis not present

## 2018-02-26 DIAGNOSIS — F5105 Insomnia due to other mental disorder: Secondary | ICD-10-CM

## 2018-02-26 DIAGNOSIS — Z23 Encounter for immunization: Secondary | ICD-10-CM

## 2018-02-26 DIAGNOSIS — F418 Other specified anxiety disorders: Secondary | ICD-10-CM

## 2018-02-26 MED ORDER — TRAZODONE HCL 50 MG PO TABS: 25.0000 mg | ORAL_TABLET | Freq: Every evening | ORAL | 3 refills | Status: DC | PRN

## 2018-02-26 NOTE — Progress Notes (Signed)
Cynthia Sims  MRN: [id number] DOB: 12/02/1950  Subjective:  HPI   The patient is a 67 year old female who presents for follow up of her anxiety.  She has been taking Citalopram but states she does not think this is working and needs to do something else.  She reports having increased stress and it is causing her to have sleep disturbance.   Patient Active Problem List   Diagnosis Date Noted  . Can't get food down 09/13/2015  . ALT (SGPT) level raised 09/13/2015  . Acid reflux 09/13/2015  . H/O adenomatous polyp of colon 09/13/2015  . Anxiety 01/14/2015  . Hypertension 01/14/2015  . Chronic kidney disease (CKD), stage III (moderate) ([place]) 01/13/2015  . Cannot sleep 02/26/2009  . Anxiety state 02/26/2009  . 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 Diabetes ([place]) 12/14/2005  . 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 Menopausal symptom 12/14/2005    Past Medical History:  Diagnosis Date  . ALT (SGPT) level raised   . Anemia   . Anxiety   . Arthritis   . Diabetes mellitus without complication ([place])   . Elevated cholesterol   . GERD (gastroesophageal reflux disease)   . Hemorrhoids   . Hypertension   . Kidney disease     Social History   Socioeconomic History  . Marital status: Married    Spouse name: Not on file  . Number of children: 5  . Years of education: Not on file  . Highest education level: 8th grade  Occupational History  . Occupation: retired  Scientific laboratory technician  . Financial resource strain: Very hard  . Food insecurity:    Worry: Sometimes true    Inability: Never true  . Transportation needs:    Medical: No    Non-medical: No  Tobacco Use  . Smoking status: Never Smoker  . Smokeless tobacco: Never Used  Substance and Sexual Activity  . Alcohol use: No    Alcohol/week: 0.0 standard drinks  . Drug use: No  . Sexual  activity: Not on file  Lifestyle  . Physical activity:    Days per week: Not on file    Minutes per session: Not on file  . Stress: Very much  Relationships  . Social connections:    Talks on phone: Not on file    Gets together: Not on file    Attends religious service: Not on file    Active member of club or organization: Not on file    Attends meetings of clubs or organizations: Not on file    Relationship status: Not on file  . Intimate partner violence:    Fear of current or ex partner: No    Emotionally abused: No    Physically abused: No    Forced sexual activity: No  Other Topics Concern  . Not on file  Social History Narrative   Applied for food stamps. Pt is living off social security money and is raising one of her grandchildren.     Outpatient Encounter Medications as of 02/26/2018  Medication Sig  . aspirin EC 81 MG tablet Take 81 mg by mouth daily.   . citalopram (CELEXA) 20 MG tablet Take 1.5 tablets (30 mg total) by mouth at bedtime.  . Cranberry 125 MG TABS Take 1 tablet by mouth daily.  . ferrous sulfate 325 (65 FE) MG tablet  Take 1 tablet by mouth daily.  Marland Kitchen glipiZIDE (GLUCOTROL) 10 MG tablet Take 1 tablet (10 mg total) by mouth 2 (two) times daily.  Marland Kitchen glucose blood (ONE TOUCH ULTRA TEST) test strip USE ONE STRIP TO CHECK GLUCOSE ONCE DAILY  . lisinopril-hydrochlorothiazide (PRINZIDE,ZESTORETIC) 10-12.5 MG tablet TAKE 1 TABLET BY MOUTH ONCE DAILY  . lovastatin (MEVACOR) 40 MG tablet Take 1 tablet (40 mg total) by mouth daily.  . metFORMIN (GLUCOPHAGE) 500 MG tablet Take 1 tablet (500 mg total) by mouth 2 (two) times daily with a meal.  . omeprazole (PRILOSEC) 40 MG capsule Take 40 mg by mouth daily.   Glory Rosebush DELICA LANCETS 70J MISC USE ONE  TO CHECK GLUCOSE ONCE DAILY  . sulfamethoxazole-trimethoprim (BACTRIM DS,SEPTRA DS) 800-160 MG tablet Take 1 tablet by mouth 2 (two) times daily for 7 days.   No facility-administered encounter medications on file as of  02/26/2018.    No Known Allergies  Review of Systems  Constitutional: Positive for malaise/fatigue. Negative for fever.  Respiratory: Negative for cough, shortness of breath and wheezing.   Cardiovascular: Negative for chest pain, palpitations, orthopnea, claudication and leg swelling.  Neurological: Positive for dizziness and headaches.  Psychiatric/Behavioral: Positive for depression and memory loss. Negative for hallucinations, substance abuse and suicidal ideas. The patient is nervous/anxious and has insomnia.     Objective:  BP 138/77 (BP Location: Left Arm, Patient Position: Sitting, Cuff Size: Normal)   Pulse 84   Temp 98.1 F (36.7 C) (Oral)   Resp 16   Wt 186 lb (84.4 kg)   BMI 35.14 kg/m   Physical Exam  Constitutional: She is oriented to person, place, and time and well-developed, well-nourished, and in no distress.  HENT:  Head: Normocephalic.  Right Ear: External ear normal.  Left Ear: External ear normal.  Nose: Nose normal.  Eyes: Conjunctivae and EOM are normal.  Neck: Neck supple.  Cardiovascular: Normal rate and regular rhythm.  Pulmonary/Chest: Effort normal and breath sounds normal.  Abdominal: Soft. Bowel sounds are normal.  Neurological: She is alert and oriented to person, place, and time.  Psychiatric: Memory and judgment normal.  Sleep disturbance, appetite down a little, sad and some anxiety. No suicidal ideation. Occasionally irritable.    Assessment and Plan :   1. Insomnia secondary to depression with anxiety Having sleep disturbance, anxiety and depressive symptoms persist despite use of Celexa in the past up to 30 mg hs. Been off it for the past month and wants to try a different medication. Will give trial of Trazodone and recheck in a month when she is due for diabetes follow up. - traZODone (DESYREL) 50 MG tablet; Take 0.5-1 tablets (25-50 mg total) by mouth at bedtime as needed for sleep.  Dispense: 30 tablet; Refill: 3  2. 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

## 2018-03-20 DIAGNOSIS — R69 Illness, unspecified: Secondary | ICD-10-CM | POA: Diagnosis not present

## 2018-03-22 DIAGNOSIS — R69 Illness, unspecified: Secondary | ICD-10-CM | POA: Diagnosis not present

## 2018-03-25 ENCOUNTER — Ambulatory Visit: Payer: Medicare HMO | Admitting: Urology

## 2018-03-25 ENCOUNTER — Encounter: Payer: Self-pay | Admitting: Urology

## 2018-03-25 VITALS — BP 131/79 | HR 85 | Ht 61.0 in | Wt 187.6 lb

## 2018-03-25 DIAGNOSIS — N3090 Cystitis, unspecified without hematuria: Secondary | ICD-10-CM | POA: Diagnosis not present

## 2018-03-25 LAB — URINALYSIS, COMPLETE
Bilirubin, UA: NEGATIVE
Glucose, UA: NEGATIVE
Ketones, UA: NEGATIVE
Nitrite, UA: POSITIVE — AB
Protein, UA: NEGATIVE
Specific Gravity, UA: 1.025 (ref 1.005–1.030)
Urobilinogen, Ur: 0.2 mg/dL (ref 0.2–1.0)
pH, UA: 5.5 (ref 5.0–7.5)

## 2018-03-25 LAB — MICROSCOPIC EXAMINATION: RBC, UA: NONE SEEN /hpf (ref 0–2)

## 2018-03-25 MED ORDER — SULFAMETHOXAZOLE-TRIMETHOPRIM 800-160 MG PO TABS: 1.0000 | ORAL_TABLET | Freq: Two times a day (BID) | ORAL | 0 refills | Status: DC

## 2018-03-25 MED ORDER — TRIMETHOPRIM 100 MG PO TABS: 100.0000 mg | ORAL_TABLET | Freq: Every day | ORAL | 6 refills | Status: DC

## 2018-03-25 NOTE — Addendum Note (Signed)
Addended by: Donalee Citrin on: 03/25/2018 01:12 PM   Modules accepted: Orders

## 2018-03-25 NOTE — Progress Notes (Signed)
03/25/2018 9:50 AM   Cynthia Sims 05-04-1951 [id number]  Referring provider: Margo Common, [address] [place], [place] [id number]  CC: Urinary retention/recurrent UTI  HPI: I had the pleasure of seeing Cynthia Sims in urology clinic today in consultation for urinary retention and recurrent UTI from Dr. Brita Romp.  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  She was previously followed by urogynecology at [facility], and had urodynamics in 2016.  Urodynamics showed no evidence of detrusor overactivity.  There was reduced bladder sensation, normal compliance, and atonic detrusor.  She was offered InterStim at that time, but that she did not follow-up.  Baseline creatinine is approximately 1.4.  Recent CT in May 2019 did not show any hydronephrosis.  She is also had difficulty with recurrent UTIs recently, including Klebsiella 02/20/2018.  She does have some pelvic pain with UTI.  However, on chart review it is unclear if she has been treated for asymptomatic bacteriuria versus true urinary tract infection.  She denies gross hematuria.  She denies any vision changes or back pain.  She has previously undergone pelvic floor physical therapy which did not improve her voiding.   PMH: Past Medical History:  Diagnosis Date  . ALT (SGPT) level raised   . Anemia   . Anxiety   . Arthritis   . Depression   . Diabetes mellitus without complication ([place])   . Elevated cholesterol   . GERD (gastroesophageal reflux disease)   . Hemorrhoids   . Hypertension   . Kidney disease     Surgical History: Past Surgical History:  Procedure Laterality Date  . CESAREAN SECTION  1983  . CESAREAN SECTION    . CHOLECYSTECTOMY    . COLONOSCOPY WITH PROPOFOL N/A 10/29/2015   Procedure: COLONOSCOPY WITH PROPOFOL;  Surgeon: Manya Silvas, MD;  Location: [facility] ENDOSCOPY;  Service: Endoscopy;  Laterality: N/A;    . COLONOSCOPY WITH PROPOFOL N/A 11/07/2017   Procedure: COLONOSCOPY WITH PROPOFOL;  Surgeon: Toledo, Benay Pike, MD;  Location: [facility] ENDOSCOPY;  Service: Gastroenterology;  Laterality: N/A;  . DILATION AND CURETTAGE OF UTERUS  2001  . ESOPHAGOGASTRODUODENOSCOPY (EGD) WITH PROPOFOL N/A 10/29/2015   Procedure: ESOPHAGOGASTRODUODENOSCOPY (EGD) WITH PROPOFOL;  Surgeon: Manya Silvas, MD;  [facility] ENDOSCOPY;  Service: Endoscopy;  Laterality: N/A;   Allergies: No Known Allergies  Family History: Family History  Problem Relation Age of Onset  . Alzheimer's disease Mother   . COPD Mother   . Breast cancer Mother 60  . Osteoporosis Mother   . Heart attack Father   . Lupus Father   . Diabetes Father   . Hypertension Father   . Diabetes Sister   . COPD Maternal Grandmother   . Diabetes Paternal Grandfather   . Diabetes Sister   . Diabetes Sister     Social History:  reports that she has never smoked. She has never used smokeless tobacco. She reports that she does not drink alcohol or use drugs.  ROS: Please see flowsheet from today's date for complete review of systems.  Physical Exam: BP 131/79 (BP Location: Left Arm, Patient Position: Sitting, Cuff Size: Large)   Pulse 85   Ht 5\' 1"  (1.549 m)   Wt 187 lb 9.6 oz (85.1 kg)   BMI 35.45 kg/m    Constitutional:  Alert and oriented, No acute distress. Cardiovascular: No clubbing, cyanosis, or edema. Respiratory: Normal respiratory effort, no increased work of breathing.  GI: Abdomen is soft, nontender, nondistended, no abdominal masses GU: No CVA tenderness Lymph: No cervical or inguinal lymphadenopathy. Skin: No rashes, bruises or suspicious lesions. Neurologic: Grossly intact, no focal deficits, moving all 4 extremities. Psychiatric: Normal mood and affect.  Laboratory Data: Creatinine 1.4 Urinalysis with 6-10 WBCs, 0 RBCs, many bacteria, nitrite positive.  Will send for culture  Pertinent Imaging: I have personally  reviewed the CT without contrast dated 10/18/2017.  There is no hydronephrosis or urolithiasis.  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  From her history it is unclear if she has been being treated with antibiotics for asymptomatic bacteriuria, versus true UTI.  I stressed at length the importance of only treating bacteria in the urine when she has symptoms such as pelvic pain, fever, or bladder spasm.  Regarding her renal function, she does not have any hydronephrosis on her recent CT in May 2019, and I suspect her chronic kidney disease is more likely secondary to diabetes and hypertension, not outlet obstruction.  Finally, regarding her possible recurrent UTIs, I did recommend treatment of the urine today with 3 days of treatment Bactrim as she has nitrite positive urine with many bacteria and reports she is having pelvic pain.  After that I recommended a trial of trimethoprim 100 mg daily to see if this improves her recurrent infections.  Follow-up with Dr. McDiarmid in 4 weeks with renal ultrasound prior for further discussion of possible InterStim.  Return in about 4 weeks (around 04/22/2018) for renal ultrasound prior, see McDiarmid.  Billey Co, [facility] [address] [place], [place] [address] [phone]

## 2018-03-28 ENCOUNTER — Ambulatory Visit (INDEPENDENT_AMBULATORY_CARE_PROVIDER_SITE_OTHER): Payer: Medicare HMO | Admitting: Family Medicine

## 2018-03-28 ENCOUNTER — Encounter: Payer: Self-pay | Admitting: Family Medicine

## 2018-03-28 ENCOUNTER — Other Ambulatory Visit: Payer: Self-pay

## 2018-03-28 VITALS — BP 128/68 | HR 70 | Temp 97.6°F | Ht 61.0 in | Wt 191.4 lb

## 2018-03-28 DIAGNOSIS — N312 Flaccid neuropathic bladder, not elsewhere classified: Secondary | ICD-10-CM

## 2018-03-28 DIAGNOSIS — I10 Essential (primary) hypertension: Secondary | ICD-10-CM

## 2018-03-28 DIAGNOSIS — D649 Anemia, unspecified: Secondary | ICD-10-CM | POA: Diagnosis not present

## 2018-03-28 DIAGNOSIS — E0842 Diabetes mellitus due to underlying condition with diabetic polyneuropathy: Secondary | ICD-10-CM | POA: Diagnosis not present

## 2018-03-28 NOTE — Progress Notes (Addendum)
[name] Female    DOB: 30-Dec-1950   67 y.o.   MRN: [id number] Visit Date: 03/28/2018  Today's Provider: [name], PA   Chief Complaint  Patient presents with  . Diabetes    1 month f-up   Subjective:    HPI pt reports she is here for a follow up for diabetes. Continues to take the Glipizide 10 mg BID and Metformin 500 mg BID. FBS 91 this morning and only rare hypoglycemic symptoms. Peripheral neuropathy (prickly pains in feet and lower legs intermittently) stable and not worsening but still present. Some blurring of vision (has not accomplished ophthalmology exam this year, yet).     Wt Readings from Last 3 Encounters:  03/28/18 191 lb 6.4 oz (86.8 kg)  03/25/18 187 lb 9.6 oz (85.1 kg)  02/26/18 186 lb (84.4 kg)   Lipid Panel     Component Value Date/Time   CHOL 149 12/21/2017 0924   TRIG 155 (H) 12/21/2017 0924   HDL 53 12/21/2017 0924   CHOLHDL 2.8 12/21/2017 0924   LDLCALC 65 12/21/2017 0924   Past Medical History:  Diagnosis Date  . ALT (SGPT) level raised   . Anemia   . Anxiety   . Arthritis   . Depression   . Diabetes mellitus without complication ([place])   . Elevated cholesterol   . GERD (gastroesophageal reflux disease)   . Hemorrhoids   . Hypertension   . Kidney disease    Past Surgical History:  Procedure Laterality Date  . CESAREAN SECTION  1983  . CESAREAN SECTION    . CHOLECYSTECTOMY    . COLONOSCOPY WITH PROPOFOL N/A 10/29/2015   Procedure: COLONOSCOPY WITH PROPOFOL;  Surgeon: Manya Silvas, MD;  Location: [facility] ENDOSCOPY;  Service: Endoscopy;  Laterality: N/A;  . COLONOSCOPY WITH PROPOFOL N/A 11/07/2017   Procedure: COLONOSCOPY WITH PROPOFOL;  Surgeon: Toledo, Benay Pike, MD;  Location: [facility] ENDOSCOPY;  Service: Gastroenterology;  Laterality: N/A;  . DILATION AND CURETTAGE OF UTERUS  2001  . ESOPHAGOGASTRODUODENOSCOPY (EGD) WITH PROPOFOL N/A 10/29/2015   Procedure: ESOPHAGOGASTRODUODENOSCOPY (EGD) WITH PROPOFOL;   Surgeon: Manya Silvas, MD;  Location: [facility] ENDOSCOPY;  Service: Endoscopy;  Laterality: N/A;   Family History  Problem Relation Age of Onset  . Alzheimer's disease Mother   . COPD Mother   . Breast cancer Mother 54  . Osteoporosis Mother   . Heart attack Father   . Lupus Father   . Diabetes Father   . Hypertension Father   . Diabetes Sister   . COPD Maternal Grandmother   . Diabetes Paternal Grandfather   . Diabetes Sister   . Diabetes Sister    No Known Allergies  Current Outpatient Medications:  .  aspirin EC 81 MG tablet, Take 81 mg by mouth daily. , Disp: , Rfl:  .  Cranberry 125 MG TABS, Take 1 tablet by mouth daily., Disp: , Rfl:  .  ferrous sulfate 325 (65 FE) MG tablet, Take 1 tablet by mouth daily., Disp: , Rfl:  .  glipiZIDE (GLUCOTROL) 10 MG tablet, Take 1 tablet (10 mg total) by mouth 2 (two) times daily., Disp: 180 tablet, Rfl: 3 .  glucose blood (ONE TOUCH ULTRA TEST) test strip, USE ONE STRIP TO CHECK GLUCOSE ONCE DAILY, Disp: 90 each, Rfl: 3 .  lisinopril-hydrochlorothiazide (PRINZIDE,ZESTORETIC) 10-12.5 MG tablet, TAKE 1 TABLET BY MOUTH ONCE DAILY, Disp: 90 tablet, Rfl: 3 .  lovastatin (MEVACOR) 40 MG tablet, Take 1 tablet (  40 mg total) by mouth daily., Disp: 90 tablet, Rfl: 3 .  metFORMIN (GLUCOPHAGE) 500 MG tablet, Take 1 tablet (500 mg total) by mouth 2 (two) times daily with a meal., Disp: 180 tablet, Rfl: 3 .  omeprazole (PRILOSEC) 40 MG capsule, Take 40 mg by mouth daily. , Disp: , Rfl:  .  ONETOUCH DELICA LANCETS 12X MISC, USE ONE  TO CHECK GLUCOSE ONCE DAILY, Disp: 100 each, Rfl: 12 .  sulfamethoxazole-trimethoprim (BACTRIM DS,SEPTRA DS) 800-160 MG tablet, Take 1 tablet by mouth 2 (two) times daily., Disp: 6 tablet, Rfl: 0 .  traZODone (DESYREL) 50 MG tablet, Take 0.5-1 tablets (25-50 mg total) by mouth at bedtime as needed for sleep., Disp: 30 tablet, Rfl: 3 .  trimethoprim (TRIMPEX) 100 MG tablet, Take 1 tablet (100 mg total) by mouth daily., Disp: 30  tablet, Rfl: 6 .  citalopram (CELEXA) 20 MG tablet, Take 1.5 tablets (30 mg total) by mouth at bedtime. (Patient not taking: Reported on 03/28/2018), Disp: 135 tablet, Rfl: 1  Review of Systems  Constitutional: Negative.   HENT: Negative.   Eyes: Positive for visual disturbance (Some blurring occasionally).  Cardiovascular: Negative.   Gastrointestinal: Negative.   Endocrine: Negative.   Genitourinary: Positive for urgency.       Bladder atony and urologist treating UTI. Still uses self-catheterization TID.     Social History   Tobacco Use  . Smoking status: Never Smoker  . Smokeless tobacco: Never Used  Substance Use Topics  . Alcohol use: No    Alcohol/week: 0.0 standard drinks   Objective:   BP 128/68 (BP Location: Right Arm, Patient Position: Sitting, Cuff Size: Normal)   Pulse 70   Temp 97.6 F (36.4 C) (Oral)   Ht 5\' 1"  (1.549 m)   Wt 191 lb 6.4 oz (86.8 kg)   SpO2 94%   BMI 36.16 kg/m  Vitals:   03/28/18 0812  BP: 128/68  Pulse: 70  Temp: 97.6 F (36.4 C)  TempSrc: Oral  SpO2: 94%  Weight: 191 lb 6.4 oz (86.8 kg)  Height: 5\' 1"  (1.549 m)     Physical Exam  Constitutional: She is oriented to person, place, and time. She appears well-developed and well-nourished. No distress.  HENT:  Head: Normocephalic and atraumatic.  Right Ear: Hearing normal.  Left Ear: Hearing normal.  Nose: Nose normal.  Eyes: Conjunctivae and lids are normal. Right eye exhibits no discharge. Left eye exhibits no discharge. No scleral icterus.  Pulmonary/Chest: Effort normal. No respiratory distress.  Musculoskeletal: Normal range of motion.  Some crepitus in knees.  Neurological: She is alert and oriented to person, place, and time.  Persistent numbness in plantar surface of both feet causing some imbalance issues when she first stands.  Skin: Skin is intact. No lesion and no rash noted.  Psychiatric: She has a normal mood and affect. Her speech is normal and behavior is normal.  Thought content normal.      Assessment & Plan:     1. Diabetes mellitus due to underlying condition with diabetic polyneuropathy, without long-term current use of insulin (HCC) Last Hgb A1C on 12-21-17 was 6.6%. Continues to follow low fat diabetic diet and take Glipizide with Metformin. FBS 91 this morning at home. Peripheral neuropathy unchanged. Still taking ACE and Statin. Recheck labs and follow up pending reports. - CBC with Differential/Platelet - Comprehensive metabolic panel - Hemoglobin A1c  2. Essential hypertension Stable BP and well controlled. Continues to take the Prinzide 10-12.5 mg qd. Recheck  labs. Denies chest pains, dyspnea or palpitations. - CBC with Differential/Platelet - Comprehensive metabolic panel  3. Atony of bladder Must continue self-catheterization insertion kits TID due to permanent urinary retention with frequent UTI's and urologist planning further evaluation of bladder function. Is about to start a low dose antibiotic daily for prevention of UTI's. Check CBC and CMP. Proceed with follow up with Dr. Rogue Bussing (urology). - CBC with Differential/Platelet - Comprehensive metabolic panel       [name], PA  [facility]

## 2018-03-29 LAB — CBC WITH DIFFERENTIAL/PLATELET
Basophils Absolute: 0 10*3/uL (ref 0.0–0.2)
Basos: 0 %
EOS (ABSOLUTE): 0 10*3/uL (ref 0.0–0.4)
Eos: 1 %
Hematocrit: 30.1 % — ABNORMAL LOW (ref 34.0–46.6)
Hemoglobin: 10.3 g/dL — ABNORMAL LOW (ref 11.1–15.9)
Immature Grans (Abs): 0 10*3/uL (ref 0.0–0.1)
Immature Granulocytes: 0 %
Lymphocytes Absolute: 2.2 10*3/uL (ref 0.7–3.1)
Lymphs: 35 %
MCH: 31.7 pg (ref 26.6–33.0)
MCHC: 34.2 g/dL (ref 31.5–35.7)
MCV: 93 fL (ref 79–97)
Monocytes Absolute: 0.4 10*3/uL (ref 0.1–0.9)
Monocytes: 6 %
Neutrophils Absolute: 3.6 10*3/uL (ref 1.4–7.0)
Neutrophils: 58 %
Platelets: 145 10*3/uL — ABNORMAL LOW (ref 150–450)
RBC: 3.25 x10E6/uL — ABNORMAL LOW (ref 3.77–5.28)
RDW: 13.9 % (ref 12.3–15.4)
WBC: 6.3 10*3/uL (ref 3.4–10.8)

## 2018-03-29 LAB — COMPREHENSIVE METABOLIC PANEL
ALT: 34 IU/L — ABNORMAL HIGH (ref 0–32)
AST: 50 IU/L — ABNORMAL HIGH (ref 0–40)
Albumin/Globulin Ratio: 1.7 (ref 1.2–2.2)
Albumin: 4 g/dL (ref 3.6–4.8)
Alkaline Phosphatase: 109 IU/L (ref 39–117)
BUN/Creatinine Ratio: 14 (ref 12–28)
BUN: 26 mg/dL (ref 8–27)
Bilirubin Total: 0.3 mg/dL (ref 0.0–1.2)
CO2: 15 mmol/L — ABNORMAL LOW (ref 20–29)
Calcium: 8.5 mg/dL — ABNORMAL LOW (ref 8.7–10.3)
Chloride: 108 mmol/L — ABNORMAL HIGH (ref 96–106)
Creatinine, Ser: 1.86 mg/dL — ABNORMAL HIGH (ref 0.57–1.00)
GFR calc Af Amer: 31 mL/min/{1.73_m2} — ABNORMAL LOW (ref >59)
GFR calc non Af Amer: 27 mL/min/{1.73_m2} — ABNORMAL LOW (ref >59)
Globulin, Total: 2.4 g/dL (ref 1.5–4.5)
Glucose: 52 mg/dL — ABNORMAL LOW (ref 65–99)
Potassium: 4.8 mmol/L (ref 3.5–5.2)
Sodium: 140 mmol/L (ref 134–144)
Total Protein: 6.4 g/dL (ref 6.0–8.5)

## 2018-03-29 LAB — CULTURE, URINE COMPREHENSIVE

## 2018-03-29 LAB — HEMOGLOBIN A1C
Est. average glucose Bld gHb Est-mCnc: 143 mg/dL
Hgb A1c MFr Bld: 6.6 % — ABNORMAL HIGH (ref 4.8–5.6)

## 2018-04-02 ENCOUNTER — Telehealth: Payer: Self-pay | Admitting: Urology

## 2018-04-02 NOTE — Telephone Encounter (Signed)
[name], PA from [facility] sent a fax with the patient's labs from 03/28/2018 for your review.

## 2018-04-03 LAB — IRON AND TIBC
Iron Saturation: 23 % (ref 15–55)
Iron: 65 ug/dL (ref 27–139)
Total Iron Binding Capacity: 278 ug/dL (ref 250–450)
UIBC: 213 ug/dL (ref 118–369)

## 2018-04-03 LAB — SPECIMEN STATUS REPORT

## 2018-04-23 ENCOUNTER — Ambulatory Visit
Admission: RE | Admit: 2018-04-23 | Discharge: 2018-04-23 | Disposition: A | Discharge status: Home / Self Care | Payer: Medicare HMO | Source: Ambulatory Visit | Attending: Urology | Admitting: Urology

## 2018-04-23 DIAGNOSIS — N3090 Cystitis, unspecified without hematuria: Secondary | ICD-10-CM | POA: Diagnosis not present

## 2018-04-23 DIAGNOSIS — N2889 Other specified disorders of kidney and ureter: Secondary | ICD-10-CM | POA: Diagnosis not present

## 2018-04-23 DIAGNOSIS — N1330 Unspecified hydronephrosis: Secondary | ICD-10-CM | POA: Insufficient documentation

## 2018-04-29 ENCOUNTER — Ambulatory Visit: Payer: Medicare HMO | Admitting: Urology

## 2018-04-29 ENCOUNTER — Encounter: Payer: Self-pay | Admitting: Urology

## 2018-04-29 VITALS — BP 112/68 | HR 79 | Ht 61.0 in | Wt 186.1 lb

## 2018-04-29 DIAGNOSIS — N3090 Cystitis, unspecified without hematuria: Secondary | ICD-10-CM

## 2018-04-29 LAB — URINALYSIS, COMPLETE
Bilirubin, UA: NEGATIVE
Glucose, UA: NEGATIVE
Ketones, UA: NEGATIVE
Nitrite, UA: POSITIVE — AB
Protein, UA: NEGATIVE
RBC, UA: NEGATIVE
Specific Gravity, UA: >1.03 — ABNORMAL HIGH (ref 1.005–1.030)
Urobilinogen, Ur: 0.2 mg/dL (ref 0.2–1.0)
pH, UA: 5.5 (ref 5.0–7.5)

## 2018-04-29 LAB — MICROSCOPIC EXAMINATION: RBC, UA: NONE SEEN /hpf (ref 0–2)

## 2018-04-29 NOTE — Progress Notes (Signed)
04/29/2018 9:54 AM   Cynthia Sims August 04, 1950 [id number]  Referring provider: Margo Common, [place], [place] [address]  Chief Complaint  Patient presents with  . Follow-up    RUS results    HPI: Dr Chauncey Cruel: I had the pleasure of seeing Cynthia Sims in urology clinic today in consultation for urinary retention and recurrent UTI from Dr. Brita Romp.  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  She was previously followed by urogynecology at [facility], and had urodynamics in 2016.  Urodynamics showed no evidence of detrusor overactivity.  There was reduced bladder sensation, normal compliance, and atonic detrusor.  She was offered InterStim at that time, but that she did not follow-up.  Baseline creatinine is approximately 1.4.  Recent CT in May 2019 did not show any hydronephrosis.  She is also had difficulty with recurrent UTIs recently, including Klebsiella 02/20/2018.  She does have some pelvic pain with UTI.  However, on chart review it is unclear if she has been treated for asymptomatic bacteriuria versus true urinary tract infection.    She has previously undergone pelvic floor physical therapy which did not improve her voiding.  Today Incomplete bladder emptying stable.  Clinically not infected.  I agree that sometimes she gets cloudy urine that comes and goes.  She is currently on a once a day antibiotic and she is not certain if it is helping.  I am not convinced that she is getting many symptomatic urinary tract infections.  I will not send today's urine and overtreated  She still catheterizing 4 times a day.  She is continent.  She does feel bladder fullness.  Other than oral hypoglycemics she does not have other neurologic issues  We talked about InterStim in detail.  Usual template detail.  The role of the test stimulation discussed.  Recognizing she has retention I still  offered the PNE and not the surgical lead stage I.  We will try to leave it in longer.  Based upon the clinical presentation and assessment at [facility] I did not recommend repeat testing.  If she did have InterStim could be done here locally  Modifying factors: There are no other modifying factors  Associated signs and symptoms: There are no other associated signs and symptoms Aggravating and relieving factors: There are no other aggravating or relieving factors Severity: Moderate Duration: Persistent   PMH: Past Medical History:  Diagnosis Date  . ALT (SGPT) level raised   . Anemia   . Anxiety   . Arthritis   . Depression   . Diabetes mellitus without complication ([place])   . Elevated cholesterol   . GERD (gastroesophageal reflux disease)   . Hemorrhoids   . Hypertension   . Kidney disease     Surgical History: Past Surgical History:  Procedure Laterality Date  . CESAREAN SECTION  1983  . CESAREAN SECTION    . CHOLECYSTECTOMY    . COLONOSCOPY WITH PROPOFOL N/A 10/29/2015   Procedure: COLONOSCOPY WITH PROPOFOL;  Surgeon: Manya Silvas, MD;  Location: [facility] ENDOSCOPY;  Service: Endoscopy;  Laterality: N/A;  . COLONOSCOPY WITH PROPOFOL N/A 11/07/2017   Procedure: COLONOSCOPY WITH PROPOFOL;  Surgeon: Toledo, Benay Pike, MD;  Location: [facility] ENDOSCOPY;  Service: Gastroenterology;  Laterality: N/A;  . DILATION AND CURETTAGE OF UTERUS  2001  . ESOPHAGOGASTRODUODENOSCOPY (EGD) WITH PROPOFOL N/A 10/29/2015   Procedure: ESOPHAGOGASTRODUODENOSCOPY (EGD) WITH PROPOFOL;  Surgeon: Gavin Pound  Vira Agar, MD;  Location: [facility] ENDOSCOPY;  Service: Endoscopy;  Laterality: N/A;    Home Medications:  Allergies as of 04/29/2018   No Known Allergies     Medication List        Accurate as of 04/29/18  9:54 AM. Always use your most recent med list.          aspirin EC 81 MG tablet Take 81 mg by mouth daily.   citalopram 20 MG tablet Commonly known as:  CELEXA Take 1.5 tablets (30 mg  total) by mouth at bedtime.   Cranberry 125 MG Tabs Take 1 tablet by mouth daily.   ferrous sulfate 325 (65 FE) MG tablet Take 1 tablet by mouth daily.   glipiZIDE 10 MG tablet Commonly known as:  GLUCOTROL Take 1 tablet (10 mg total) by mouth 2 (two) times daily.   glucose blood test strip USE ONE STRIP TO CHECK GLUCOSE ONCE DAILY   lisinopril-hydrochlorothiazide 10-12.5 MG tablet Commonly known as:  PRINZIDE,ZESTORETIC TAKE 1 TABLET BY MOUTH ONCE DAILY   lovastatin 40 MG tablet Commonly known as:  MEVACOR Take 1 tablet (40 mg total) by mouth daily.   metFORMIN 500 MG tablet Commonly known as:  GLUCOPHAGE Take 1 tablet (500 mg total) by mouth 2 (two) times daily with a meal.   omeprazole 40 MG capsule Commonly known as:  PRILOSEC Take 40 mg by mouth daily.   ONETOUCH DELICA LANCETS 10U Misc USE ONE  TO CHECK GLUCOSE ONCE DAILY   traZODone 50 MG tablet Commonly known as:  DESYREL Take 0.5-1 tablets (25-50 mg total) by mouth at bedtime as needed for sleep.   trimethoprim 100 MG tablet Commonly known as:  TRIMPEX Take 1 tablet (100 mg total) by mouth daily.       Allergies: No Known Allergies  Family History: Family History  Problem Relation Age of Onset  . Alzheimer's disease Mother   . COPD Mother   . Breast cancer Mother 40  . Osteoporosis Mother   . Heart attack Father   . Lupus Father   . Diabetes Father   . Hypertension Father   . Diabetes Sister   . COPD Maternal Grandmother   . Diabetes Paternal Grandfather   . Diabetes Sister   . Diabetes Sister     Social History:  reports that she has never smoked. She has never used smokeless tobacco. She reports that she does not drink alcohol or use drugs.  ROS:                                        Physical Exam: BP 112/68 (BP Location: Left Arm, Patient Position: Sitting, Cuff Size: Normal)   Pulse 79   Ht 5\' 1"  (1.549 m)   Wt 84.4 kg   BMI 35.16 kg/m   Constitutional:   Alert and oriented, No acute distress. HEENT: [place] AT, moist mucus membranes.  Trachea midline, no masses.  Laboratory Data: Lab Results  Component Value Date   WBC 6.3 03/28/2018   HGB 10.3 (L) 03/28/2018   HCT 30.1 (L) 03/28/2018   MCV 93 03/28/2018   PLT 145 (L) 03/28/2018    Lab Results  Component Value Date   CREATININE 1.86 (H) 03/28/2018    No results found for: PSA  No results found for: TESTOSTERONE  Lab Results  Component Value Date   HGBA1C 6.6 (H) 03/28/2018  Urinalysis    Component Value Date/Time   APPEARANCEUR Cloudy (A) 03/25/2018 0841   GLUCOSEU Negative 03/25/2018 0841   BILIRUBINUR Negative 03/25/2018 0841   PROTEINUR Negative 03/25/2018 0841   UROBILINOGEN 0.2 02/20/2018 0904   NITRITE Positive (A) 03/25/2018 0841   LEUKOCYTESUR 1+ (A) 03/25/2018 0841    Pertinent Imaging:   Assessment & Plan: She wants to think about it.  I gave her my phone number in [place] if she ever to have the test stimulation.  I can understand why she is hesitant.  I think the chance of success is likely 10 to 20% but not higher.  Otherwise she will be seen as needed.  Treat only symptomatic urinary tract infections  1. Cystitis  - Urinalysis, Complete   No follow-ups on file.  Reece Packer, MD  [facility] [address], [place], [place] [address] [phone]

## 2018-05-06 ENCOUNTER — Other Ambulatory Visit: Payer: Self-pay

## 2018-05-06 ENCOUNTER — Ambulatory Visit: Payer: Self-pay | Admitting: Family Medicine

## 2018-05-06 DIAGNOSIS — D649 Anemia, unspecified: Secondary | ICD-10-CM

## 2018-05-06 DIAGNOSIS — N183 Chronic kidney disease, stage 3 unspecified: Secondary | ICD-10-CM

## 2018-05-06 NOTE — Progress Notes (Signed)
c-met  

## 2018-05-06 NOTE — Progress Notes (Deleted)
       [name] Female    DOB: 1950/07/09   67 y.o.   MRN: [id number] Visit Date: 05/06/2018  Today's Provider: [name], PA   No chief complaint on file.  Subjective:    HPI     No Known Allergies   Current Outpatient Medications:  .  aspirin EC 81 MG tablet, Take 81 mg by mouth daily. , Disp: , Rfl:  .  citalopram (CELEXA) 20 MG tablet, Take 1.5 tablets (30 mg total) by mouth at bedtime. (Patient not taking: Reported on 03/28/2018), Disp: 135 tablet, Rfl: 1 .  Cranberry 125 MG TABS, Take 1 tablet by mouth daily., Disp: , Rfl:  .  ferrous sulfate 325 (65 FE) MG tablet, Take 1 tablet by mouth daily., Disp: , Rfl:  .  glipiZIDE (GLUCOTROL) 10 MG tablet, Take 1 tablet (10 mg total) by mouth 2 (two) times daily., Disp: 180 tablet, Rfl: 3 .  glucose blood (ONE TOUCH ULTRA TEST) test strip, USE ONE STRIP TO CHECK GLUCOSE ONCE DAILY, Disp: 90 each, Rfl: 3 .  lisinopril-hydrochlorothiazide (PRINZIDE,ZESTORETIC) 10-12.5 MG tablet, TAKE 1 TABLET BY MOUTH ONCE DAILY, Disp: 90 tablet, Rfl: 3 .  lovastatin (MEVACOR) 40 MG tablet, Take 1 tablet (40 mg total) by mouth daily., Disp: 90 tablet, Rfl: 3 .  metFORMIN (GLUCOPHAGE) 500 MG tablet, Take 1 tablet (500 mg total) by mouth 2 (two) times daily with a meal., Disp: 180 tablet, Rfl: 3 .  omeprazole (PRILOSEC) 40 MG capsule, Take 40 mg by mouth daily. , Disp: , Rfl:  .  ONETOUCH DELICA LANCETS 14D MISC, USE ONE  TO CHECK GLUCOSE ONCE DAILY, Disp: 100 each, Rfl: 12 .  traZODone (DESYREL) 50 MG tablet, Take 0.5-1 tablets (25-50 mg total) by mouth at bedtime as needed for sleep., Disp: 30 tablet, Rfl: 3 .  trimethoprim (TRIMPEX) 100 MG tablet, Take 1 tablet (100 mg total) by mouth daily., Disp: 30 tablet, Rfl: 6  Review of Systems  Social History   Tobacco Use  . Smoking status: Never Smoker  . Smokeless tobacco: Never Used  Substance Use Topics  . Alcohol use: No    Alcohol/week: 0.0 standard drinks   Objective:   There were no vitals taken for this visit. There were no vitals filed for this visit.   Physical Exam      Assessment & Plan:           [name], PA  [facility]

## 2018-05-07 LAB — CBC WITH DIFFERENTIAL/PLATELET
Basophils Absolute: 0 10*3/uL (ref 0.0–0.2)
Basos: 0 %
EOS (ABSOLUTE): 0.1 10*3/uL (ref 0.0–0.4)
Eos: 1 %
Hematocrit: 36.9 % (ref 34.0–46.6)
Hemoglobin: 12.4 g/dL (ref 11.1–15.9)
Immature Grans (Abs): 0 10*3/uL (ref 0.0–0.1)
Immature Granulocytes: 0 %
Lymphocytes Absolute: 2.6 10*3/uL (ref 0.7–3.1)
Lymphs: 37 %
MCH: 30.8 pg (ref 26.6–33.0)
MCHC: 33.6 g/dL (ref 31.5–35.7)
MCV: 92 fL (ref 79–97)
Monocytes Absolute: 0.5 10*3/uL (ref 0.1–0.9)
Monocytes: 6 %
Neutrophils Absolute: 3.9 10*3/uL (ref 1.4–7.0)
Neutrophils: 56 %
Platelets: 165 10*3/uL (ref 150–450)
RBC: 4.03 x10E6/uL (ref 3.77–5.28)
RDW: 13.5 % (ref 12.3–15.4)
WBC: 7.1 10*3/uL (ref 3.4–10.8)

## 2018-05-07 LAB — COMPREHENSIVE METABOLIC PANEL
ALT: 22 IU/L (ref 0–32)
AST: 22 IU/L (ref 0–40)
Albumin/Globulin Ratio: 1.7 (ref 1.2–2.2)
Albumin: 4.5 g/dL (ref 3.6–4.8)
Alkaline Phosphatase: 78 IU/L (ref 39–117)
BUN/Creatinine Ratio: 14 (ref 12–28)
BUN: 23 mg/dL (ref 8–27)
Bilirubin Total: 0.3 mg/dL (ref 0.0–1.2)
CO2: 15 mmol/L — ABNORMAL LOW (ref 20–29)
Calcium: 8.9 mg/dL (ref 8.7–10.3)
Chloride: 107 mmol/L — ABNORMAL HIGH (ref 96–106)
Creatinine, Ser: 1.6 mg/dL — ABNORMAL HIGH (ref 0.57–1.00)
GFR calc Af Amer: 38 mL/min/{1.73_m2} — ABNORMAL LOW (ref >59)
GFR calc non Af Amer: 33 mL/min/{1.73_m2} — ABNORMAL LOW (ref >59)
Globulin, Total: 2.6 g/dL (ref 1.5–4.5)
Glucose: 89 mg/dL (ref 65–99)
Potassium: 4.8 mmol/L (ref 3.5–5.2)
Sodium: 140 mmol/L (ref 134–144)
Total Protein: 7.1 g/dL (ref 6.0–8.5)

## 2018-05-08 ENCOUNTER — Telehealth: Payer: Self-pay

## 2018-05-08 NOTE — Telephone Encounter (Signed)
Pt advised of labs.  Scheduled f-up for 3 months diabetes.  dbs

## 2018-05-21 DIAGNOSIS — R339 Retention of urine, unspecified: Secondary | ICD-10-CM | POA: Diagnosis not present

## 2018-05-21 DIAGNOSIS — N319 Neuromuscular dysfunction of bladder, unspecified: Secondary | ICD-10-CM | POA: Diagnosis not present

## 2018-05-21 DIAGNOSIS — N312 Flaccid neuropathic bladder, not elsewhere classified: Secondary | ICD-10-CM | POA: Diagnosis not present

## 2018-05-21 DIAGNOSIS — N390 Urinary tract infection, site not specified: Secondary | ICD-10-CM | POA: Diagnosis not present

## 2018-07-08 DIAGNOSIS — R69 Illness, unspecified: Secondary | ICD-10-CM | POA: Diagnosis not present

## 2018-07-11 ENCOUNTER — Telehealth: Payer: Self-pay

## 2018-07-11 NOTE — Telephone Encounter (Signed)
Called pt to schedule her AWV and pt states that she would like a CB in a couple weeks to schedule her apt. Note made. -MM

## 2018-08-01 NOTE — Telephone Encounter (Signed)
Spoke with pt who stated she was driving at the time and requested a CB. Advised her I would CB Monday. Pt agreed. -MM

## 2018-08-02 NOTE — Progress Notes (Deleted)
[name] Female    DOB: 10/21/1950   68 y.o.   MRN: [id number] Visit Date: 08/02/2018  Today's Provider: [name], PA   No chief complaint on file.  Subjective:     HPI    Diabetes Mellitus Type II, Follow-up:   Lab Results  Component Value Date   HGBA1C 6.6 (H) 03/28/2018   HGBA1C 6.6 (H) 12/21/2017   HGBA1C 6.9 08/02/2017   Last seen for diabetes 4 months ago.  Management since then includes; labs checked, no changes. She reports {excellent/good/fair/poor:19665} compliance with treatment. She {ACTION; IS/IS [id number]} having side effects. *** Current symptoms include {Symptoms; diabetes:14075} and have been {Desc; course:15616}. Home blood sugar records: {diabetes glucometry results:16657}  Episodes of hypoglycemia? {yes***/no:17258}   Current Insulin Regimen: *** Most Recent Eye Exam: *** Weight trend: {trend:16658} Prior visit with dietician: {yes/no:17258} Current diet: {diet habits:16563} Current exercise: {exercise types:16438}  ------------------------------------------------------------------------   Hypertension, follow-up:  BP Readings from Last 3 Encounters:  04/29/18 112/68  03/28/18 128/68  03/25/18 131/79    She was last seen for hypertension 4 months ago.  BP at that visit was 128/68. Management since that visit includes; labs checked, no changes.She reports {excellent/good/fair/poor:19665} compliance with treatment. She {ACTION; IS/IS [id number]} having side effects. *** She {is/is not:9024} exercising. She {is/is not:9024} adherent to low salt diet.   Outside blood pressures are ***. She is experiencing {Symptoms; cardiac:12860}.  Patient denies {Symptoms; cardiac:12860}.   Cardiovascular risk factors include {cv risk factors:510}.  Use of agents associated with hypertension: {bp agents assoc with hypertension:511::"none"}.    ------------------------------------------------------------------------   No Known Allergies   Current Outpatient Medications:  .  aspirin EC 81 MG tablet, Take 81 mg by mouth daily. , Disp: , Rfl:  .  citalopram (CELEXA) 20 MG tablet, Take 1.5 tablets (30 mg total) by mouth at bedtime. (Patient not taking: Reported on 03/28/2018), Disp: 135 tablet, Rfl: 1 .  Cranberry 125 MG TABS, Take 1 tablet by mouth daily., Disp: , Rfl:  .  ferrous sulfate 325 (65 FE) MG tablet, Take 1 tablet by mouth daily., Disp: , Rfl:  .  glipiZIDE (GLUCOTROL) 10 MG tablet, Take 1 tablet (10 mg total) by mouth 2 (two) times daily., Disp: 180 tablet, Rfl: 3 .  glucose blood (ONE TOUCH ULTRA TEST) test strip, USE ONE STRIP TO CHECK GLUCOSE ONCE DAILY, Disp: 90 each, Rfl: 3 .  lisinopril-hydrochlorothiazide (PRINZIDE,ZESTORETIC) 10-12.5 MG tablet, TAKE 1 TABLET BY MOUTH ONCE DAILY, Disp: 90 tablet, Rfl: 3 .  lovastatin (MEVACOR) 40 MG tablet, Take 1 tablet (40 mg total) by mouth daily., Disp: 90 tablet, Rfl: 3 .  metFORMIN (GLUCOPHAGE) 500 MG tablet, Take 1 tablet (500 mg total) by mouth 2 (two) times daily with a meal., Disp: 180 tablet, Rfl: 3 .  omeprazole (PRILOSEC) 40 MG capsule, Take 40 mg by mouth daily. , Disp: , Rfl:  .  ONETOUCH DELICA LANCETS 78I MISC, USE ONE  TO CHECK GLUCOSE ONCE DAILY, Disp: 100 each, Rfl: 12 .  traZODone (DESYREL) 50 MG tablet, Take 0.5-1 tablets (25-50 mg total) by mouth at bedtime as needed for sleep., Disp: 30 tablet, Rfl: 3 .  trimethoprim (TRIMPEX) 100 MG tablet, Take 1 tablet (100 mg total) by mouth daily., Disp: 30 tablet, Rfl: 6  Review of Systems  Constitutional: Negative for appetite change, chills, fatigue and fever.  Respiratory: Negative for chest tightness and shortness of breath.   Cardiovascular: Negative for chest pain and  palpitations.  Gastrointestinal: Negative for abdominal pain, nausea and vomiting.  Neurological: Negative for dizziness and weakness.     Social History   Tobacco Use  . Smoking status: Never Smoker  . Smokeless tobacco: Never Used  Substance Use Topics  . Alcohol use: No    Alcohol/week: 0.0 standard drinks      Objective:   There were no vitals taken for this visit. There were no vitals filed for this visit.   Physical Exam      Assessment & Plan        [name], PA  [facility]

## 2018-08-05 ENCOUNTER — Ambulatory Visit: Payer: Self-pay | Admitting: Family Medicine

## 2018-08-05 NOTE — Telephone Encounter (Signed)
Scheduled AWV for 08/22/18 @ 10:00 AM.  -MM

## 2018-08-05 NOTE — Progress Notes (Signed)
[name] Female    DOB: [birth year]   68 y.o.   MRN: [id number] Visit Date: 08/08/2018  Today's Provider: [name], PA   Chief Complaint  Patient presents with  . Follow-up  . Diabetes  . Hypertension   Subjective:     HPI    Diabetes Mellitus Type II, Follow-up:   Lab Results  Component Value Date   HGBA1C 6.9 (A) 08/08/2018   HGBA1C 6.6 (H) 03/28/2018   HGBA1C 6.6 (H) 12/21/2017   Last seen for diabetes 4 months ago.  Management since then includes; labs checked, no changes. She reports good compliance with treatment. She is not having side effects. none Current symptoms include none and have been unchanged. Home blood sugar records: fasting range: 99-150  Episodes of hypoglycemia? no   Current Insulin Regimen: n/a Most Recent Eye Exam: due Weight trend: stable Prior visit with dietician: no Current diet: well balanced Current exercise: none  -------------------------------------------------------------------    Hypertension, follow-up:  BP Readings from Last 3 Encounters:  08/08/18 118/68  04/29/18 112/68  03/28/18 128/68    She was last seen for hypertension 4 months ago.  BP at that visit was 128/68. Management since that visit includes; labs checked, no changes.She reports good compliance with treatment. She is not having side effects. none She is not exercising. She is adherent to low salt diet.   Outside blood pressures are not checking. She is experiencing none.  Patient denies none.   Cardiovascular risk factors include diabetes mellitus.  Use of agents associated with hypertension: none.   -------------------------------------------------------------------  Past Medical History:  Diagnosis Date  . ALT (SGPT) level raised   . Anemia   . Anxiety   . Arthritis   . Depression   . Diabetes mellitus without complication ([place])   . Elevated cholesterol   . GERD (gastroesophageal reflux disease)   .  Hemorrhoids   . Hypertension   . Kidney disease    Past Surgical History:  Procedure Laterality Date  . CESAREAN SECTION  1983  . CESAREAN SECTION    . CHOLECYSTECTOMY    . COLONOSCOPY WITH PROPOFOL N/A 10/29/2015   Procedure: COLONOSCOPY WITH PROPOFOL;  Surgeon: Manya Silvas, MD;  Location: [facility] ENDOSCOPY;  Service: Endoscopy;  Laterality: N/A;  . COLONOSCOPY WITH PROPOFOL N/A 11/07/2017   Procedure: COLONOSCOPY WITH PROPOFOL;  Surgeon: Toledo, Benay Pike, MD;  Location: [facility] ENDOSCOPY;  Service: Gastroenterology;  Laterality: N/A;  . DILATION AND CURETTAGE OF UTERUS  2001  . ESOPHAGOGASTRODUODENOSCOPY (EGD) WITH PROPOFOL N/A 10/29/2015   Procedure: ESOPHAGOGASTRODUODENOSCOPY (EGD) WITH PROPOFOL;  Surgeon: Manya Silvas, MD;  Location: [facility] ENDOSCOPY;  Service: Endoscopy;  Laterality: N/A;   Family History  Problem Relation Age of Onset  . Alzheimer's disease Mother   . COPD Mother   . Breast cancer Mother 28  . Osteoporosis Mother   . Heart attack Father   . Lupus Father   . Diabetes Father   . Hypertension Father   . Diabetes Sister   . COPD Maternal Grandmother   . Diabetes Paternal Grandfather   . Diabetes Sister   . Diabetes Sister   ' No Known Allergies   Current Outpatient Medications:  .  aspirin EC 81 MG tablet, Take 81 mg by mouth daily. , Disp: , Rfl:  .  Cranberry 125 MG TABS, Take 1 tablet by mouth daily., Disp: , Rfl:  .  ferrous sulfate 325 (65 FE) MG tablet, Take  1 tablet by mouth daily., Disp: , Rfl:  .  glipiZIDE (GLUCOTROL) 10 MG tablet, Take 1 tablet (10 mg total) by mouth 2 (two) times daily., Disp: 180 tablet, Rfl: 3 .  glucose blood (ONE TOUCH ULTRA TEST) test strip, USE ONE STRIP TO CHECK GLUCOSE ONCE DAILY, Disp: 90 each, Rfl: 3 .  lisinopril-hydrochlorothiazide (PRINZIDE,ZESTORETIC) 10-12.5 MG tablet, TAKE 1 TABLET BY MOUTH ONCE DAILY, Disp: 90 tablet, Rfl: 3 .  lovastatin (MEVACOR) 40 MG tablet, Take 1 tablet (40 mg total) by mouth daily.,  Disp: 90 tablet, Rfl: 3 .  metFORMIN (GLUCOPHAGE) 500 MG tablet, Take 1 tablet (500 mg total) by mouth 2 (two) times daily with a meal., Disp: 180 tablet, Rfl: 3 .  omeprazole (PRILOSEC) 40 MG capsule, Take 40 mg by mouth daily. , Disp: , Rfl:  .  ONETOUCH DELICA LANCETS 84X MISC, USE ONE  TO CHECK GLUCOSE ONCE DAILY, Disp: 100 each, Rfl: 12 .  traZODone (DESYREL) 50 MG tablet, Take 0.5-1 tablets (25-50 mg total) by mouth at bedtime as needed for sleep., Disp: 30 tablet, Rfl: 3 .  trimethoprim (TRIMPEX) 100 MG tablet, Take 1 tablet (100 mg total) by mouth daily., Disp: 30 tablet, Rfl: 6 .  citalopram (CELEXA) 20 MG tablet, Take 1.5 tablets (30 mg total) by mouth at bedtime. (Patient not taking: Reported on 03/28/2018), Disp: 135 tablet, Rfl: 1  Review of Systems  Constitutional: Negative for appetite change, chills, fatigue and fever.  Respiratory: Negative for chest tightness and shortness of breath.   Cardiovascular: Negative for chest pain and palpitations.  Gastrointestinal: Negative for abdominal pain, nausea and vomiting.  Neurological: Negative for dizziness and weakness.   Social History   Tobacco Use  . Smoking status: Never Smoker  . Smokeless tobacco: Never Used  Substance Use Topics  . Alcohol use: No    Alcohol/week: 0.0 standard drinks     Objective:   BP 118/68 (BP Location: Right Arm, Patient Position: Sitting, Cuff Size: Large)   Pulse (!) 47   Temp 98.1 F (36.7 C) (Oral)   Wt 191 lb 3.2 oz (86.7 kg)   SpO2 99%   BMI 36.13 kg/m    Wt Readings from Last 3 Encounters:  08/08/18 191 lb 3.2 oz (86.7 kg)  04/29/18 186 lb 1.6 oz (84.4 kg)  03/28/18 191 lb 6.4 oz (86.8 kg)   Vitals:   08/08/18 0850  BP: 118/68  Pulse: (!) 47  Temp: 98.1 F (36.7 C)  TempSrc: Oral  SpO2: 99%  Weight: 191 lb 3.2 oz (86.7 kg)   Physical Exam Constitutional:      General: She is not in acute distress.    Appearance: She is well-developed.  HENT:     Head: Normocephalic  and atraumatic.     Right Ear: Hearing and tympanic membrane normal.     Left Ear: Hearing and tympanic membrane normal.     Nose: Nose normal.  Eyes:     General: Lids are normal. No scleral icterus.       Right eye: No discharge.        Left eye: No discharge.     Conjunctiva/sclera: Conjunctivae normal.  Neck:     Musculoskeletal: Neck supple.  Cardiovascular:     Rate and Rhythm: Normal rate and regular rhythm.     Heart sounds: Normal heart sounds.  Pulmonary:     Effort: Pulmonary effort is normal. No respiratory distress.  Abdominal:     General: Bowel sounds are  normal.  Musculoskeletal: Normal range of motion.  Skin:    Findings: No lesion or rash.  Neurological:     Mental Status: She is alert and oriented to person, place, and time.     Sensory: Sensory deficit present.     Comments: Large callus on the plantar surface of the left foot over the metatarsal heads. No corns. Very poor sensation the entire plantar surfaces of both feet to test with nylon string. No calluses or corns on the right foot. No nail thickening or fungus. Many varicose veins in feet.  Psychiatric:        Speech: Speech normal.        Behavior: Behavior normal.        Thought Content: Thought content normal.       Assessment & Plan    1. Diabetes mellitus due to underlying condition with diabetic polyneuropathy, without long-term current use of insulin (HCC) FBS in the 99-150 range most days. Urine microalbumen level was 50 mg/L. Still on ACE and Statin. Detailed foot exam today shows large callus on the plantar surface of the right foot with diminished sensation plantar surface of both feet to test with nylon string. Many superficial varicose veins without edema. Pulses are 2+ and symmetric. Needs follow up with podiatrist (Dr. Cleda Mccreedy) and diabetic shoes. Still tolerating Metformin 500 mg BID and Glipizide 10 mg BID. Hgb A1C stable at 6.9% today. Recheck routine labs and follow up in 3 months. -  POCT glycosylated hemoglobin (Hb A1C) - CBC with Differential/Platelet - Comprehensive metabolic panel - Lipid panel - POCT UA - Microalbumin  2. Essential hypertension Well controlled BP. Still taking Lisinopril-HCTZ 10-12.5 mg qd without side effects. Recheck CBC, CMP, TSH and Lipid Panel to assess cardiovascular risk. - CBC with Differential/Platelet - Comprehensive metabolic panel - Lipid panel - TSH  3. Asymptomatic varicose veins of both lower extremities Many superficial varicose veins without edema. Wears support hose daily and finds this help with ache in calves. Normal pulses bilaterally.  4. 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 Many superficial varicose veins without edema. Pulses are 2+ and symmetric but no open ulcerations. Needs follow up with podiatrist (Dr. Cleda Mccreedy) and diabetic shoes to prevent diabetic foot ulcers.  - POCT UA - Microalbumin  5. Combined fat and carbohydrate induced hyperlipemia Tolerates Lovastatin 40 mg qd without side effects.Triglycerides slightly elevated at 155 with LDL 65 and 53 on 12-21-17. Continue to work on weight loss and drink plenty of water. Recheck CMP, Lipid Panel and TSH. - Comprehensive metabolic panel - Lipid panel - TSH     [name], PA  [facility]

## 2018-08-08 ENCOUNTER — Encounter: Payer: Self-pay | Admitting: Family Medicine

## 2018-08-08 ENCOUNTER — Ambulatory Visit (INDEPENDENT_AMBULATORY_CARE_PROVIDER_SITE_OTHER): Payer: Medicare HMO | Admitting: Family Medicine

## 2018-08-08 VITALS — BP 118/68 | HR 47 | Temp 98.1°F | Wt 191.2 lb

## 2018-08-08 DIAGNOSIS — E0842 Diabetes mellitus due to underlying condition with diabetic polyneuropathy: Secondary | ICD-10-CM | POA: Diagnosis not present

## 2018-08-08 DIAGNOSIS — E782 Mixed hyperlipidemia: Secondary | ICD-10-CM

## 2018-08-08 DIAGNOSIS — L84 Corns and callosities: Secondary | ICD-10-CM

## 2018-08-08 DIAGNOSIS — E11628 Type 2 diabetes mellitus with other skin complications: Secondary | ICD-10-CM | POA: Diagnosis not present

## 2018-08-08 DIAGNOSIS — I10 Essential (primary) hypertension: Secondary | ICD-10-CM

## 2018-08-08 DIAGNOSIS — I8393 Asymptomatic varicose veins of bilateral lower extremities: Secondary | ICD-10-CM | POA: Diagnosis not present

## 2018-08-08 LAB — POCT GLYCOSYLATED HEMOGLOBIN (HGB A1C)
Est. average glucose Bld gHb Est-mCnc: 151
Hemoglobin A1C: 6.9 % — AB (ref 4.0–5.6)

## 2018-08-08 LAB — POCT UA - MICROALBUMIN: Microalbumin Ur, POC: 50 mg/L

## 2018-08-09 LAB — CBC WITH DIFFERENTIAL/PLATELET
Basophils Absolute: 0 10*3/uL (ref 0.0–0.2)
Basos: 0 %
EOS (ABSOLUTE): 0 10*3/uL (ref 0.0–0.4)
Eos: 1 %
Hematocrit: 32.9 % — ABNORMAL LOW (ref 34.0–46.6)
Hemoglobin: 11.4 g/dL (ref 11.1–15.9)
Immature Grans (Abs): 0 10*3/uL (ref 0.0–0.1)
Immature Granulocytes: 0 %
Lymphocytes Absolute: 2.4 10*3/uL (ref 0.7–3.1)
Lymphs: 37 %
MCH: 31.8 pg (ref 26.6–33.0)
MCHC: 34.7 g/dL (ref 31.5–35.7)
MCV: 92 fL (ref 79–97)
Monocytes Absolute: 0.4 10*3/uL (ref 0.1–0.9)
Monocytes: 7 %
Neutrophils Absolute: 3.6 10*3/uL (ref 1.4–7.0)
Neutrophils: 55 %
Platelets: 149 10*3/uL — ABNORMAL LOW (ref 150–450)
RBC: 3.58 x10E6/uL — ABNORMAL LOW (ref 3.77–5.28)
RDW: 13.2 % (ref 11.7–15.4)
WBC: 6.5 10*3/uL (ref 3.4–10.8)

## 2018-08-09 LAB — COMPREHENSIVE METABOLIC PANEL
ALT: 20 [IU]/L (ref 0–32)
AST: 18 IU/L (ref 0–40)
Albumin/Globulin Ratio: 2 (ref 1.2–2.2)
Albumin: 4.4 g/dL (ref 3.8–4.8)
Alkaline Phosphatase: 66 IU/L (ref 39–117)
BUN/Creatinine Ratio: 17 (ref 12–28)
BUN: 24 mg/dL (ref 8–27)
Bilirubin Total: 0.2 mg/dL (ref 0.0–1.2)
CO2: 16 mmol/L — ABNORMAL LOW (ref 20–29)
Calcium: 8.6 mg/dL — ABNORMAL LOW (ref 8.7–10.3)
Chloride: 110 mmol/L — ABNORMAL HIGH (ref 96–106)
Creatinine, Ser: 1.38 mg/dL — ABNORMAL HIGH (ref 0.57–1.00)
GFR calc Af Amer: 45 mL/min/{1.73_m2} — ABNORMAL LOW (ref >59)
GFR calc non Af Amer: 39 mL/min/{1.73_m2} — ABNORMAL LOW (ref >59)
Globulin, Total: 2.2 g/dL (ref 1.5–4.5)
Glucose: 99 mg/dL (ref 65–99)
Potassium: 4.9 mmol/L (ref 3.5–5.2)
Sodium: 142 mmol/L (ref 134–144)
Total Protein: 6.6 g/dL (ref 6.0–8.5)

## 2018-08-09 LAB — LIPID PANEL
Chol/HDL Ratio: 2.8 {ratio} (ref 0.0–4.4)
Cholesterol, Total: 172 mg/dL (ref 100–199)
HDL: 62 mg/dL (ref >39)
LDL Calculated: 79 mg/dL (ref 0–99)
Triglycerides: 156 mg/dL — ABNORMAL HIGH (ref 0–149)
VLDL Cholesterol Cal: 31 mg/dL (ref 5–40)

## 2018-08-09 LAB — TSH: TSH: 0.984 u[IU]/mL (ref 0.450–4.500)

## 2018-08-20 DIAGNOSIS — D631 Anemia in chronic kidney disease: Secondary | ICD-10-CM | POA: Diagnosis not present

## 2018-08-20 DIAGNOSIS — R69 Illness, unspecified: Secondary | ICD-10-CM | POA: Diagnosis not present

## 2018-08-20 DIAGNOSIS — Z992 Dependence on renal dialysis: Secondary | ICD-10-CM | POA: Diagnosis not present

## 2018-08-21 ENCOUNTER — Other Ambulatory Visit: Payer: Self-pay | Admitting: Family Medicine

## 2018-08-21 DIAGNOSIS — E1122 Type 2 diabetes mellitus with diabetic chronic kidney disease: Secondary | ICD-10-CM

## 2018-08-21 DIAGNOSIS — N183 Chronic kidney disease, stage 3 unspecified: Secondary | ICD-10-CM

## 2018-08-22 ENCOUNTER — Ambulatory Visit (INDEPENDENT_AMBULATORY_CARE_PROVIDER_SITE_OTHER): Payer: Medicare HMO

## 2018-08-22 VITALS — BP 112/58 | HR 83 | Temp 98.1°F | Ht 61.0 in | Wt 191.6 lb

## 2018-08-22 DIAGNOSIS — Z1231 Encounter for screening mammogram for malignant neoplasm of breast: Secondary | ICD-10-CM | POA: Diagnosis not present

## 2018-08-22 DIAGNOSIS — Z0000 Encounter for general adult medical examination without abnormal findings: Secondary | ICD-10-CM

## 2018-08-22 NOTE — Patient Instructions (Signed)
Cynthia Sims , Thank you for taking time to come for your Medicare Wellness Visit. I appreciate your ongoing commitment to your health goals. Please review the following plan we discussed and let me know if I can assist you in the future.   Screening recommendations/referrals: Colonoscopy: Up to date, due 10/2022 Mammogram: Ordered today. Pt aware she needs to contact office to set up apt. Bone Density: Up to date, due 08/2025 Recommended yearly ophthalmology/optometry visit for glaucoma screening and checkup Recommended yearly dental visit for hygiene and checkup  Vaccinations: Influenza vaccine: Up to date Pneumococcal vaccine: Completed series Tdap vaccine: Up to date, due 09/2024 Shingles vaccine: Pt declines today.     Advanced directives: Advance directive discussed with you today. Even though you declined this today please call our office should you change your mind and we can give you the proper paperwork for you to fill out.  Conditions/risks identified: Recommend to exercise for 3 days a week for at least 30 minutes at a time.   Next appointment: 09/03/18 @ 10:40 AM with [name]. Pt declined scheduling AWV for 2021 at this time.    Preventive Care 15 Years and Older, Female Preventive care refers to lifestyle choices and visits with your health care provider that can promote health and wellness. What does preventive care include?  A yearly physical exam. This is also called an annual well check.  Dental exams once or twice a year.  Routine eye exams. Ask your health care provider how often you should have your eyes checked.  Personal lifestyle choices, including:  Daily care of your teeth and gums.  Regular physical activity.  Eating a healthy diet.  Avoiding tobacco and drug use.  Limiting alcohol use.  Practicing safe sex.  Taking low-dose aspirin every day.  Taking vitamin and mineral supplements as recommended by your health care provider. What  happens during an annual well check? The services and screenings done by your health care provider during your annual well check will depend on your age, overall health, lifestyle risk factors, and family history of disease. Counseling  Your health care provider may ask you questions about your:  Alcohol use.  Tobacco use.  Drug use.  Emotional well-being.  Home and relationship well-being.  Sexual activity.  Eating habits.  History of falls.  Memory and ability to understand (cognition).  Work and work Statistician.  Reproductive health. Screening  You may have the following tests or measurements:  Height, weight, and BMI.  Blood pressure.  Lipid and cholesterol levels. These may be checked every 5 years, or more frequently if you are over 68 years old.  Skin check.  Lung cancer screening. You may have this screening every year starting at age 80 if you have a 30-pack-year history of smoking and currently smoke or have quit within the past 15 years.  Fecal occult blood test (FOBT) of the stool. You may have this test every year starting at age 85.  Flexible sigmoidoscopy or colonoscopy. You may have a sigmoidoscopy every 5 years or a colonoscopy every 10 years starting at age 50.  Hepatitis C blood test.  Hepatitis B blood test.  Sexually transmitted disease (STD) testing.  Diabetes screening. This is done by checking your blood sugar (glucose) after you have not eaten for a while (fasting). You may have this done every 1-3 years.  Bone density scan. This is done to screen for osteoporosis. You may have this done starting at age 2.  Mammogram. This  may be done every 1-2 years. Talk to your health care provider about how often you should have regular mammograms. Talk with your health care provider about your test results, treatment options, and if necessary, the need for more tests. Vaccines  Your health care provider may recommend certain vaccines, such as:   Influenza vaccine. This is recommended every year.  Tetanus, diphtheria, and acellular pertussis (Tdap, Td) vaccine. You may need a Td booster every 10 years.  Zoster vaccine. You may need this after age 9.  Pneumococcal 13-valent conjugate (PCV13) vaccine. One dose is recommended after age 6.  Pneumococcal polysaccharide (PPSV23) vaccine. One dose is recommended after age 16. Talk to your health care provider about which screenings and vaccines you need and how often you need them. This information is not intended to replace advice given to you by your health care provider. Make sure you discuss any questions you have with your health care provider. Document Released: 06/25/2015 Document Revised: 02/16/2016 Document Reviewed: 03/30/2015 Elsevier Interactive Patient Education  2017 [employer] Prevention in the Home Falls can cause injuries. They can happen to people of all ages. There are many things you can do to make your home safe and to help prevent falls. What can I do on the outside of my home?  Regularly fix the edges of walkways and driveways and fix any cracks.  Remove anything that might make you trip as you walk through a door, such as a raised step or threshold.  Trim any bushes or trees on the path to your home.  Use bright outdoor lighting.  Clear any walking paths of anything that might make someone trip, such as rocks or tools.  Regularly check to see if handrails are loose or broken. Make sure that both sides of any steps have handrails.  Any raised decks and porches should have guardrails on the edges.  Have any leaves, snow, or ice cleared regularly.  Use sand or salt on walking paths during winter.  Clean up any spills in your garage right away. This includes oil or grease spills. What can I do in the bathroom?  Use night lights.  Install grab bars by the toilet and in the tub and shower. Do not use towel bars as grab bars.  Use non-skid mats  or decals in the tub or shower.  If you need to sit down in the shower, use a plastic, non-slip stool.  Keep the floor dry. Clean up any water that spills on the floor as soon as it happens.  Remove soap buildup in the tub or shower regularly.  Attach bath mats securely with double-sided non-slip rug tape.  Do not have throw rugs and other things on the floor that can make you trip. What can I do in the bedroom?  Use night lights.  Make sure that you have a light by your bed that is easy to reach.  Do not use any sheets or blankets that are too big for your bed. They should not hang down onto the floor.  Have a firm chair that has side arms. You can use this for support while you get dressed.  Do not have throw rugs and other things on the floor that can make you trip. What can I do in the kitchen?  Clean up any spills right away.  Avoid walking on wet floors.  Keep items that you use a lot in easy-to-reach places.  If you need to reach something above  you, use a strong step stool that has a grab bar.  Keep electrical cords out of the way.  Do not use floor polish or wax that makes floors slippery. If you must use wax, use non-skid floor wax.  Do not have throw rugs and other things on the floor that can make you trip. What can I do with my stairs?  Do not leave any items on the stairs.  Make sure that there are handrails on both sides of the stairs and use them. Fix handrails that are broken or loose. Make sure that handrails are as long as the stairways.  Check any carpeting to make sure that it is firmly attached to the stairs. Fix any carpet that is loose or worn.  Avoid having throw rugs at the top or bottom of the stairs. If you do have throw rugs, attach them to the floor with carpet tape.  Make sure that you have a light switch at the top of the stairs and the bottom of the stairs. If you do not have them, ask someone to add them for you. What else can I do to  help prevent falls?  Wear shoes that:  Do not have high heels.  Have rubber bottoms.  Are comfortable and fit you well.  Are closed at the toe. Do not wear sandals.  If you use a stepladder:  Make sure that it is fully opened. Do not climb a closed stepladder.  Make sure that both sides of the stepladder are locked into place.  Ask someone to hold it for you, if possible.  Clearly mark and make sure that you can see:  Any grab bars or handrails.  First and last steps.  Where the edge of each step is.  Use tools that help you move around (mobility aids) if they are needed. These include:  Canes.  Walkers.  Scooters.  Crutches.  Turn on the lights when you go into a dark area. Replace any light bulbs as soon as they burn out.  Set up your furniture so you have a clear path. Avoid moving your furniture around.  If any of your floors are uneven, fix them.  If there are any pets around you, be aware of where they are.  Review your medicines with your doctor. Some medicines can make you feel dizzy. This can increase your chance of falling. Ask your doctor what other things that you can do to help prevent falls. This information is not intended to replace advice given to you by your health care provider. Make sure you discuss any questions you have with your health care provider. Document Released: 03/25/2009 Document Revised: 11/04/2015 Document Reviewed: 07/03/2014 Elsevier Interactive Patient Education  2017 [employer].

## 2018-08-22 NOTE — Progress Notes (Signed)
Subjective:   Cynthia Sims is a 68 y.o. female who presents for Medicare Annual (Subsequent) preventive examination.  Review of Systems:  N/A  Cardiac Risk Factors include: advanced age (>60men, >57 women);diabetes mellitus;dyslipidemia;hypertension;obesity (BMI >30kg/m2)     Objective:     Vitals: BP (!) 112/58 (BP Location: Right Arm)   Pulse 83   Temp 98.1 F (36.7 C) (Oral)   Ht 5\' 1"  (1.549 m)   Wt 191 lb 9.6 oz (86.9 kg)   BMI 36.20 kg/m   Body mass index is 36.2 kg/m.  Advanced Directives 08/22/2018 11/07/2017 07/20/2017 07/20/2016 01/06/2016 12/27/2015 10/29/2015  Does Patient Have a Medical Advance Directive? No No No No No No No  Would patient like information on creating a medical advance directive? No - Patient declined - No - Patient declined - No - patient declined information Yes Higher education careers adviser given -    Tobacco Social History   Tobacco Use  Smoking Status Never Smoker  Smokeless Tobacco Never Used     Counseling given: Not Answered   Clinical Intake:  Pre-visit preparation completed: Yes  Pain : 0-10 Pain Score: 8  Pain Type: Neuropathic pain Pain Location: Foot Pain Orientation: Right, Left Pain Descriptors / Indicators: Burning Pain Frequency: Intermittent    Diabetes:  Is the patient diabetic?  Yes  If diabetic, was a CBG obtained today?  No Did the patient bring in their glucometer from home?  No  How often do you monitor your CBG's? Once to twice daily.   Financial Strains and Diabetes Management:  Are you having any financial strains with the device, your supplies or your medication? No .  Does the patient want to be seen by Chronic Care Management for management of their diabetes? No Would the patient like to be referred to a Nutritionist or for Diabetic Management?  No   Diabetic Exams:  Diabetic Eye Exam: Completed 09/15/14. Overdue for diabetic eye exam. Pt has been advised about the importance in completing this exam.  Pt plans to call insurance and check about coverage then plans to set up eye exam this year.   Diabetic Foot Exam: Completed 12/21/17.    Nutritional Status: BMI > 30  Obese Nutritional Risks: Nausea/ vomitting/ diarrhea(Diarrhea occasionally- unsure of cause.)   How often do you need to have someone help you when you read instructions, pamphlets, or other written materials from your doctor or pharmacy?: 1 - Never  Interpreter Needed?: No  Information entered by :: [facility], LPN  Past Medical History:  Diagnosis Date  . ALT (SGPT) level raised   . Anemia   . Anxiety   . Arthritis   . Depression   . Diabetes mellitus without complication ([place])   . Elevated cholesterol   . GERD (gastroesophageal reflux disease)   . Hemorrhoids   . Hypertension   . Kidney disease    Past Surgical History:  Procedure Laterality Date  . CESAREAN SECTION  1983  . CESAREAN SECTION    . CHOLECYSTECTOMY    . COLONOSCOPY WITH PROPOFOL N/A 10/29/2015   Procedure: COLONOSCOPY WITH PROPOFOL;  Surgeon: Manya Silvas, MD;  Location: [facility] ENDOSCOPY;  Service: Endoscopy;  Laterality: N/A;  . COLONOSCOPY WITH PROPOFOL N/A 11/07/2017   Procedure: COLONOSCOPY WITH PROPOFOL;  Surgeon: Toledo, Benay Pike, MD;  Location: [facility] ENDOSCOPY;  Service: Gastroenterology;  Laterality: N/A;  . DILATION AND CURETTAGE OF UTERUS  2001  . ESOPHAGOGASTRODUODENOSCOPY (EGD) WITH PROPOFOL N/A 10/29/2015   Procedure: ESOPHAGOGASTRODUODENOSCOPY (EGD)  WITH PROPOFOL;  Surgeon: Manya Silvas, MD;  Location: [facility] ENDOSCOPY;  Service: Endoscopy;  Laterality: N/A;   Family History  Problem Relation Age of Onset  . Alzheimer's disease Mother   . COPD Mother   . Breast cancer Mother 74  . Osteoporosis Mother   . Heart attack Father   . Lupus Father   . Diabetes Father   . Hypertension Father   . Diabetes Sister   . COPD Maternal Grandmother   . Diabetes Paternal Grandfather   . Diabetes Sister   . Diabetes Sister     Social History   Socioeconomic History  . Marital status: Married    Spouse name: Not on file  . Number of children: 5  . Years of education: Not on file  . Highest education level: 8th grade  Occupational History  . Occupation: retired  Scientific laboratory technician  . Financial resource strain: Hard  . Food insecurity:    Worry: Never true    Inability: Never true  . Transportation needs:    Medical: No    Non-medical: No  Tobacco Use  . Smoking status: Never Smoker  . Smokeless tobacco: Never Used  Substance and Sexual Activity  . Alcohol use: No    Alcohol/week: 0.0 standard drinks  . Drug use: No  . Sexual activity: Yes    Birth control/protection: Post-menopausal  Lifestyle  . Physical activity:    Days per week: 0 days    Minutes per session: 0 min  . Stress: Not on file  Relationships  . Social connections:    Talks on phone: Patient refused    Gets together: Patient refused    Attends religious service: Patient refused    Active member of club or organization: Patient refused    Attends meetings of clubs or organizations: Patient refused    Relationship status: Patient refused  Other Topics Concern  . Not on file  Social History Narrative   Applied for food stamps. Pt is living off social security money and is raising one of her grandchildren.     Outpatient Encounter Medications as of 08/22/2018  Medication Sig  . aspirin EC 81 MG tablet Take 81 mg by mouth daily.   . Cranberry 125 MG TABS Take 1 tablet by mouth daily.  . ferrous sulfate 325 (65 FE) MG tablet Take 1 tablet by mouth daily.  Marland Kitchen glipiZIDE (GLUCOTROL) 10 MG tablet Take 1 tablet (10 mg total) by mouth 2 (two) times daily.  Marland Kitchen glucose blood (ONE TOUCH ULTRA TEST) test strip USE ONE STRIP TO CHECK GLUCOSE ONCE DAILY  . lisinopril-hydrochlorothiazide (PRINZIDE,ZESTORETIC) 10-12.5 MG tablet TAKE 1 TABLET BY MOUTH ONCE DAILY  . lovastatin (MEVACOR) 40 MG tablet Take 1 tablet (40 mg total) by mouth daily.  .  metFORMIN (GLUCOPHAGE) 500 MG tablet Take 1 tablet (500 mg total) by mouth 2 (two) times daily with a meal.  . omeprazole (PRILOSEC) 40 MG capsule Take 40 mg by mouth daily.   Glory Rosebush DELICA LANCETS 81E MISC USE ONE  TO CHECK GLUCOSE ONCE DAILY  . traZODone (DESYREL) 50 MG tablet Take 0.5-1 tablets (25-50 mg total) by mouth at bedtime as needed for sleep.  Marland Kitchen trimethoprim (TRIMPEX) 100 MG tablet Take 1 tablet (100 mg total) by mouth daily.   No facility-administered encounter medications on file as of 08/22/2018.     Activities of Daily Living In your present state of health, do you have any difficulty performing the following activities: 08/22/2018  Hearing? Y  Comment Occasionally- does not wear hearing aids.   Vision? Y  Comment Needs new eye glass prescription. Pt plans to set up exam this year.   Difficulty concentrating or making decisions? N  Walking or climbing stairs? Y  Comment Due to pain in legs and feet.  Dressing or bathing? N  Doing errands, shopping? N  Preparing Food and eating ? N  Using the Toilet? N  In the past six months, have you accidently leaked urine? Y  Comment Is incontinent. Has to catherize herself three times a day.   Do you have problems with loss of bowel control? N  Managing your Medications? N  Managing your Finances? N  Housekeeping or managing your Housekeeping? N  Some recent data might be hidden    Patient Care Team: Chrismon, Vickki Muff, PA as PCP - General (Physician Assistant) Sharlotte Alamo, DPM as Consulting Physician (Podiatry) Bjorn Loser, MD as Consulting Physician (Urology)    Assessment:   This is a routine wellness examination for Ranee.  Exercise Activities and Dietary recommendations Current Exercise Habits: The patient does not participate in regular exercise at present, Exercise limited by: neurologic condition(s);orthopedic condition(s)  Goals    . DIET - REDUCE SUGAR INTAKE     Recommend monitoring amount of sugar  consumed and avoiding sweets as much as possible.     . Exercise 3x per week (30 min per time)     Recommend to exercise for 3 days a week for at least 30 minutes at a time.        Fall Risk Fall Risk  08/22/2018 03/28/2018 12/21/2017 07/20/2017 01/09/2017  Falls in the past year? 0 No Yes No Yes  Number falls in past yr: 0 - 2 or more - 2 or more  Injury with Fall? 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 - -  Risk for fall due to : - - History of fall(s);Impaired balance/gait - -  Follow up - - Falls evaluation completed;Falls prevention discussed;Education provided - -   FALL RISK PREVENTION PERTAINING TO THE HOME: Any stairs in or around the home? Yes  If so, do they handrails? No   Home free of loose throw rugs in walkways, pet beds, electrical cords, etc? Yes  Adequate lighting in your home to reduce risk of falls? Yes   ASSISTIVE DEVICES UTILIZED TO PREVENT FALLS:  Life alert? No  Use of a cane, walker or w/c? No  Grab bars in the bathroom? No  Shower chair or bench in shower? No  Elevated toilet seat or a handicapped toilet? No    TIMED UP AND GO:  Was the test performed? No .    Depression Screen PHQ 2/9 Scores 08/22/2018 03/28/2018 12/21/2017 07/20/2017  PHQ - 2 Score 6 6 2 6   PHQ- 9 Score 15 16 10 20      Cognitive Function MMSE - Mini Mental State Exam 12/24/2017  Orientation to time 5  Orientation to Place 5  Registration 3  Attention/ Calculation 5  Recall 3  Language- name 2 objects 2  Language- repeat 1  Language- follow 3 step command 3  Language- read & follow direction 1  Write a sentence 1  Copy design 1  Total score 30     6CIT Screen 08/22/2018  What Year? 0 points  What month? 0 points  What  time? 0 points  Count back from 20 0 points  Months in reverse 0 points  Repeat phrase 0 points  Total Score 0    Immunization History  Administered Date(s)  Administered  . Influenza, High Dose Seasonal PF 02/26/2018  . Influenza-Unspecified 03/16/2014, 03/24/2016, 03/12/2017  . Pneumococcal Conjugate-13 09/25/2014  . Pneumococcal Polysaccharide-23 11/16/2015  . Tdap 09/25/2014    Qualifies for Shingles Vaccine? Yes . Due for Shingrix. Education has been provided regarding the importance of this vaccine. Pt has been advised to call insurance company to determine out of pocket expense. Advised may also receive vaccine at local pharmacy or Health Dept. Verbalized acceptance and understanding.  Tdap: Up to date  Flu Vaccine: Up to date  Pneumococcal Vaccine: Up to date   Screening Tests Health Maintenance  Topic Date Due  . OPHTHALMOLOGY EXAM  08/22/1960  . MAMMOGRAM  08/31/2017  . FOOT EXAM  12/22/2018  . HEMOGLOBIN A1C  02/06/2019  . COLONOSCOPY  11/08/2022  . TETANUS/TDAP  09/24/2024  . DEXA SCAN  08/31/2025  . INFLUENZA VACCINE  Completed  . Hepatitis C Screening  Completed  . PNA vac Low Risk Adult  Completed    Cancer Screenings:  Colorectal Screening: Completed 11/07/17. Repeat every 5 years.  Mammogram: Completed 09/01/15. Repeat every year; Ordered today. Pt provided with contact info and advised to call to schedule appt.   Bone Density: Completed 09/01/15. Results reflect NORMAL. Repeat every 10 years.   Lung Cancer Screening: (Low Dose CT Chest recommended if Age 55-80 years, 30 pack-year currently smoking OR have quit w/in 15years.) does not qualify.    Additional Screening:  Hepatitis C Screening: Up to date  Vision Screening: Recommended annual ophthalmology exams for early detection of glaucoma and other disorders of the eye.  Dental Screening: Recommended annual dental exams for proper oral hygiene  Community Resource Referral:  CRR required this visit?  No       Plan:  I have personally reviewed and addressed the Medicare Annual Wellness questionnaire and have noted the following in the patient's  chart:  A. Medical and social history B. Use of alcohol, tobacco or illicit drugs  C. Current medications and supplements D. Functional ability and status E.  Nutritional status F.  Physical activity G. Advance directives H. List of other physicians I.  Hospitalizations, surgeries, and ER visits in previous 12 months J.  [place] such as hearing and vision if needed, cognitive and depression L. Referrals and appointments - none  In addition, I have reviewed and discussed with patient certain preventive protocols, quality metrics, and best practice recommendations. A written personalized care plan for preventive services as well as general preventive health recommendations were provided to patient.  See attached scanned questionnaire for additional information.   Signed,  Fabio Neighbors, LPN Nurse Health Advisor   Nurse Recommendations: Pt plans to set up an eye exam this year. Mammogram order placed. Pt aware she needs to call imaging site to set up apt.

## 2018-08-29 DIAGNOSIS — G5762 Lesion of plantar nerve, left lower limb: Secondary | ICD-10-CM | POA: Diagnosis not present

## 2018-08-29 DIAGNOSIS — M79672 Pain in left foot: Secondary | ICD-10-CM | POA: Diagnosis not present

## 2018-08-29 DIAGNOSIS — M779 Enthesopathy, unspecified: Secondary | ICD-10-CM | POA: Diagnosis not present

## 2018-09-01 ENCOUNTER — Other Ambulatory Visit: Payer: Self-pay | Admitting: Family Medicine

## 2018-09-01 DIAGNOSIS — F418 Other specified anxiety disorders: Secondary | ICD-10-CM

## 2018-09-01 DIAGNOSIS — F5105 Insomnia due to other mental disorder: Principal | ICD-10-CM

## 2018-09-03 ENCOUNTER — Encounter: Payer: Medicare HMO | Admitting: Family Medicine

## 2018-10-14 DIAGNOSIS — N312 Flaccid neuropathic bladder, not elsewhere classified: Secondary | ICD-10-CM | POA: Diagnosis not present

## 2018-10-14 DIAGNOSIS — N319 Neuromuscular dysfunction of bladder, unspecified: Secondary | ICD-10-CM | POA: Diagnosis not present

## 2018-10-14 DIAGNOSIS — N390 Urinary tract infection, site not specified: Secondary | ICD-10-CM | POA: Diagnosis not present

## 2018-10-14 DIAGNOSIS — R339 Retention of urine, unspecified: Secondary | ICD-10-CM | POA: Diagnosis not present

## 2018-11-05 ENCOUNTER — Other Ambulatory Visit: Payer: Self-pay | Admitting: Family Medicine

## 2018-11-05 MED ORDER — TRIMETHOPRIM 100 MG PO TABS: 100.0000 mg | ORAL_TABLET | Freq: Every day | ORAL | 6 refills | Status: DC

## 2018-11-19 ENCOUNTER — Other Ambulatory Visit: Payer: Self-pay | Admitting: Family Medicine

## 2018-11-19 DIAGNOSIS — N183 Chronic kidney disease, stage 3 unspecified: Secondary | ICD-10-CM

## 2018-11-19 DIAGNOSIS — E1122 Type 2 diabetes mellitus with diabetic chronic kidney disease: Secondary | ICD-10-CM

## 2018-11-19 DIAGNOSIS — I10 Essential (primary) hypertension: Secondary | ICD-10-CM

## 2018-12-03 ENCOUNTER — Ambulatory Visit (INDEPENDENT_AMBULATORY_CARE_PROVIDER_SITE_OTHER): Payer: Medicare HMO | Admitting: Family Medicine

## 2018-12-03 ENCOUNTER — Encounter: Payer: Self-pay | Admitting: Family Medicine

## 2018-12-03 ENCOUNTER — Other Ambulatory Visit: Payer: Self-pay

## 2018-12-03 VITALS — BP 126/64 | HR 86 | Temp 98.0°F | Ht 61.0 in | Wt 187.0 lb

## 2018-12-03 DIAGNOSIS — N312 Flaccid neuropathic bladder, not elsewhere classified: Secondary | ICD-10-CM

## 2018-12-03 DIAGNOSIS — N183 Chronic kidney disease, stage 3 unspecified: Secondary | ICD-10-CM

## 2018-12-03 DIAGNOSIS — F411 Generalized anxiety disorder: Secondary | ICD-10-CM

## 2018-12-03 DIAGNOSIS — I10 Essential (primary) hypertension: Secondary | ICD-10-CM

## 2018-12-03 DIAGNOSIS — R69 Illness, unspecified: Secondary | ICD-10-CM | POA: Diagnosis not present

## 2018-12-03 DIAGNOSIS — E0842 Diabetes mellitus due to underlying condition with diabetic polyneuropathy: Secondary | ICD-10-CM

## 2018-12-03 DIAGNOSIS — K219 Gastro-esophageal reflux disease without esophagitis: Secondary | ICD-10-CM | POA: Diagnosis not present

## 2018-12-03 DIAGNOSIS — E782 Mixed hyperlipidemia: Secondary | ICD-10-CM | POA: Diagnosis not present

## 2018-12-03 MED ORDER — DULOXETINE HCL 30 MG PO CPEP: ORAL_CAPSULE | ORAL | 3 refills | Status: DC

## 2018-12-03 NOTE — Progress Notes (Addendum)
[name], Female    DOB: 1951/01/26, 68 y.o.   MRN: [id number] Visit Date: 12/03/2018  Today's Provider: [name], PA   Chief Complaint  Patient presents with  . Medicare Wellness   Subjective:   Cynthia Sims was 08/22/2018  Annual wellness visit Cynthia Sims is a 68 y.o. female. She feels fairly well. She reports exercising none. She reports she is sleeping fairly well.  -----------------------------------------------------------   Review of Systems  Constitutional: Positive for fatigue.       Irritability  HENT: Negative.   Eyes: Negative.   Respiratory: Negative.   Cardiovascular: Positive for leg swelling.  Gastrointestinal: Positive for diarrhea.  Endocrine: Negative.   Genitourinary: Positive for difficulty urinating.  Skin: Negative.   Allergic/Immunologic: Negative.   Neurological: Positive for numbness.  Hematological: Negative.   Psychiatric/Behavioral: Positive for sleep disturbance.    Social History   Socioeconomic History  . Marital status: Married    Spouse name: Not on file  . Number of children: 5  . Years of education: Not on file  . Highest education level: 8th grade  Occupational History  . Occupation: retired  Scientific laboratory technician  . Financial resource strain: Hard  . Food insecurity    Worry: Never true    Inability: Never true  . Transportation needs    Medical: No    Non-medical: No  Tobacco Use  . Smoking status: Never Smoker  . Smokeless tobacco: Never Used  Substance and Sexual Activity  . Alcohol use: No    Alcohol/week: 0.0 standard drinks  . Drug use: No  . Sexual activity: Yes    Birth control/protection: Post-menopausal  Lifestyle  . Physical activity    Days per week: 0 days    Minutes per session: 0 min  . Stress: Not on file  Relationships  . Social Herbalist on phone: Patient refused    Gets together: Patient refused    Attends religious service: Patient refused   Active member of club or organization: Patient refused    Attends meetings of clubs or organizations: Patient refused    Relationship status: Patient refused  . Intimate partner violence    Fear of current or ex partner: Patient refused    Emotionally abused: Patient refused    Physically abused: Patient refused    Forced sexual activity: Patient refused  Other Topics Concern  . Not on file  Social History Narrative   Applied for food stamps. Pt is living off social security money and is raising one of her grandchildren.     Past Medical History:  Diagnosis Date  . ALT (SGPT) level raised   . Anemia   . Anxiety   . Arthritis   . Depression   . Diabetes mellitus without complication ([place])   . Elevated cholesterol   . GERD (gastroesophageal reflux disease)   . Hemorrhoids   . Hypertension   . Kidney disease      Patient Active Problem List   Diagnosis Date Noted  . Can't get food down 09/13/2015  . ALT (SGPT) level raised 09/13/2015  . Acid reflux 09/13/2015  . H/O adenomatous polyp of colon 09/13/2015  . Anxiety 01/14/2015  . Hypertension 01/14/2015  . Chronic kidney disease (CKD), stage III (moderate) ([place]) 01/13/2015  . Cannot sleep 02/26/2009  . Anxiety state 02/26/2009  . 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 Diabetes ([place]) 12/14/2005  . 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 Menopausal symptom 12/14/2005    Past Surgical History:  Procedure Laterality Date  . CESAREAN SECTION  1983  . CESAREAN SECTION    . CHOLECYSTECTOMY    . COLONOSCOPY WITH PROPOFOL N/A 10/29/2015   Procedure: COLONOSCOPY WITH PROPOFOL;  Surgeon: Manya Silvas, MD;  Location[facility]COPY;  Service: Endoscopy;  Laterality: N/A;  . COLONOSCOPY WITH PROPOFOL N/A 11/07/2017   Procedure: COLONOSCOPY WITH PROPOFOL;  Surgeon: Toledo, Benay Pike, MD;  Location:  [facility] ENDOSCOPY;  Service: Gastroenterology;  Laterality: N/A;  . DILATION AND CURETTAGE OF UTERUS  2001  . ESOPHAGOGASTRODUODENOSCOPY (EGD) WITH PROPOFOL N/A 10/29/2015   Procedure: ESOPHAGOGASTRODUODENOSCOPY (EGD) WITH PROPOFOL;  Surgeon: Manya Silvas, MD;  Location: [facility] ENDOSCOPY;  Service: Endoscopy;  Laterality: N/A;    Her family history includes Alzheimer's disease in her mother; Breast cancer (age of onset: 8) in her mother; COPD in her maternal grandmother and mother; Diabetes in her father, paternal grandfather, sister, sister, and sister; Heart attack in her father; Hypertension in her father; Lupus in her father; Osteoporosis in her mother.   Current Outpatient Medications:  .  aspirin EC 81 MG tablet, Take 81 mg by mouth daily. , Disp: , Rfl:  .  Cranberry 125 MG TABS, Take 1 tablet by mouth daily., Disp: , Rfl:  .  ferrous sulfate 325 (65 FE) MG tablet, Take 1 tablet by mouth daily., Disp: , Rfl:  .  glipiZIDE (GLUCOTROL) 10 MG tablet, Take 1 tablet (10 mg total) by mouth 2 (two) times daily., Disp: 180 tablet, Rfl: 3 .  glucose blood (ONE TOUCH ULTRA TEST) test strip, USE ONE STRIP TO CHECK GLUCOSE ONCE DAILY, Disp: 90 each, Rfl: 3 .  lisinopril-hydrochlorothiazide (ZESTORETIC) 10-12.5 MG tablet, Take 1 tablet by mouth once daily, Disp: 90 tablet, Rfl: 0 .  lovastatin (MEVACOR) 40 MG tablet, Take 1 tablet by mouth once daily, Disp: 90 tablet, Rfl: 0 .  metFORMIN (GLUCOPHAGE) 500 MG tablet, Take 1 tablet (500 mg total) by mouth 2 (two) times daily with a meal., Disp: 180 tablet, Rfl: 3 .  omeprazole (PRILOSEC) 40 MG capsule, Take 40 mg by mouth daily. , Disp: , Rfl:  .  ONETOUCH DELICA LANCETS 93Z MISC, USE ONE  TO CHECK GLUCOSE ONCE DAILY, Disp: 100 each, Rfl: 12 .  trimethoprim (TRIMPEX) 100 MG tablet, Take 1 tablet (100 mg total) by mouth daily., Disp: 30 tablet, Rfl: 6  Patient Care Team: Chrismon, Vickki Muff, PA as PCP - General (Physician Assistant) Sharlotte [place], DPM as  Consulting Physician (Podiatry) Bjorn Loser, MD as Consulting Physician (Urology)    Objective:    Vitals: BP 126/64 (BP Location: Left Arm, Patient Position: Sitting, Cuff Size: Normal)   Pulse 86   Temp 98 F (36.7 C) (Oral)   Ht 5\' 1"  (1.549 m)   Wt 187 lb (84.8 kg)   SpO2 97%   BMI 35.33 kg/m   Physical Exam Constitutional:      Appearance: She is well-developed.  HENT:     Head: Normocephalic and atraumatic.     Right Ear: External ear normal.     Left Ear: External ear normal.     Nose: Nose normal.  Eyes:     General:        Right eye: No discharge.     Conjunctiva/sclera: Conjunctivae normal.  Pupils: Pupils are equal, round, and reactive to light.  Neck:     Musculoskeletal: Normal range of motion and neck supple.     Thyroid: No thyromegaly.     Trachea: No tracheal deviation.  Cardiovascular:     Rate and Rhythm: Normal rate and regular rhythm.     Heart sounds: Normal heart sounds. No murmur.  Pulmonary:     Effort: Pulmonary effort is normal. No respiratory distress.     Breath sounds: Normal breath sounds. No wheezing or rales.  Chest:     Chest wall: No tenderness.  Abdominal:     General: There is no distension.     Palpations: Abdomen is soft. There is no mass.     Tenderness: There is no abdominal tenderness. There is no guarding or rebound.  Musculoskeletal: Normal range of motion.        General: No tenderness.     Comments: Very poor sensation the entire plantar surfaces of both feet. Callus is smoother since treatment by podiatrist (Dr. Cleda Mccreedy) on 08-29-18.  Lymphadenopathy:     Cervical: No cervical adenopathy.  Skin:    General: Skin is warm and dry.     Findings: No erythema or rash.  Neurological:     Mental Status: She is alert and oriented to person, place, and time.     Cranial Nerves: No cranial nerve deficit.     Motor: No abnormal muscle tone.     Coordination: Coordination normal.     Deep Tendon Reflexes: Reflexes are  normal and symmetric. Reflexes normal.  Psychiatric:        Behavior: Behavior normal.        Thought Content: Thought content normal.        Judgment: Judgment normal.    Activities of Daily Living In your present state of health, do you have any difficulty performing the following activities: 08/22/2018  Hearing? Y  Comment Occasionally- does not wear hearing aids.   Vision? Y  Comment Needs new eye glass prescription. Pt plans to set up exam this year.   Difficulty concentrating or making decisions? N  Walking or climbing stairs? Y  Comment Due to pain in legs and feet.  Dressing or bathing? N  Doing errands, shopping? N  Preparing Food and eating ? N  Using the Toilet? N  In the past six months, have you accidently leaked urine? Y  Comment Is incontinent. Has to catherize herself three times a day.   Do you have problems with loss of bowel control? N  Managing your Medications? N  Managing your Finances? N  Housekeeping or managing your Housekeeping? N  Some recent data might be hidden    Fall Risk Assessment Fall Risk  08/22/2018 03/28/2018 12/21/2017 07/20/2017 01/09/2017  Falls in the past year? 0 No Yes No Yes  Number falls in past yr: 0 - 2 or more - 2 or more  Injury with Fall? 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 - -  Risk for fall due to : - - History of fall(s);Impaired balance/gait - -  Follow up - - Falls evaluation completed;Falls prevention discussed;Education provided - -     Depression Screen PHQ 2/9 Scores 08/22/2018 03/28/2018 12/21/2017 07/20/2017  PHQ - 2 Score 6 6 2 6   PHQ- 9 Score 15 16 10 20     6CIT Screen  08/22/2018  What Year? 0 points  What month? 0 points  What time? 0 points  Count back from 20 0 points  Months in reverse 0 points  Repeat phrase 0 points  Total Score 0      Assessment & Plan:     Annual Wellness Visit  Reviewed patient's  Family Medical History Reviewed and updated list of patient's medical providers Assessment of cognitive impairment was done Assessed patient's functional ability Established a written schedule for health screening [place] Completed and Reviewed  Exercise Activities and Dietary recommendations Goals    . DIET - REDUCE SUGAR INTAKE     Recommend monitoring amount of sugar consumed and avoiding sweets as much as possible.     . Exercise 3x per week (30 min per time)     Recommend to exercise for 3 days a week for at least 30 minutes at a time.        Immunization History  Administered Date(s) Administered  . Influenza, High Dose Seasonal PF 02/26/2018  . Influenza-Unspecified 03/16/2014, 03/24/2016, 03/12/2017  . Pneumococcal Conjugate-13 09/25/2014  . Pneumococcal Polysaccharide-23 11/16/2015  . Tdap 09/25/2014    Health Maintenance  Topic Date Due  . OPHTHALMOLOGY EXAM  08/22/1958  . MAMMOGRAM  08/31/2017  . FOOT EXAM  12/22/2018  . INFLUENZA VACCINE  01/11/2019  . HEMOGLOBIN A1C  02/06/2019  . COLONOSCOPY  11/08/2022  . TETANUS/TDAP  09/24/2024  . DEXA SCAN  08/31/2025  . Hepatitis C Screening  Completed  . PNA vac Low Risk Adult  Completed    Discussed health benefits of physical activity, and encouraged her to engage in regular exercise appropriate for her age and condition.    ------------------------------------------------------------------------------------------------------------ 1. Diabetes mellitus due to underlying condition with diabetic polyneuropathy, without long-term current use of insulin (HCC) Microalbumin in urine was 50 mg/L on 08-08-18 with Hgb A1C 6.9. Tolerating Metformin 500 mg BID and Glipizide 10 mg BID. Continues to have tingling in toes, feet and legs intermittently with poor balance due to diminished proprioception. Will treat neuropathic pains with Duloxetine and recheck routine labs. Encouraged to plan ophthalmology exam  this year. Had podiatry exam 08-29-18 and had a callus on the foot trimmed/treated. Recheck pending reports. - CBC with Differential/Platelet - Comprehensive metabolic panel - Lipid panel - Hemoglobin A1c - DULoxetine (CYMBALTA) 30 MG capsule; Take one capsule by mouth daily for 1 week then increase to 2 daily.  Dispense: 60 capsule; Refill: 3  2. Chronic kidney disease (CKD), stage III (moderate) (HCC) Still on ACE-I and HCTZ. Trying to restrict salt/sodium intake. On 08-08-18, creatinine was 1.38 with GFR 39. Continue present regimen and self-catheterization TID for bladder atony. Recheck CBC, CMP and TSH. - CBC with Differential/Platelet - Comprehensive metabolic panel - TSH  3. Combined fat and carbohydrate induced hyperlipemia Tolerating the Lovastatin 40 mg qd and reminded her to follow low fat diabetic diet. Recheck CMP and Lipid Panel. - Comprehensive metabolic panel - Lipid panel  4. Essential hypertension Well controlled on the Lisinopril-HCTZ 10-12.5 mg qd. Recheck routine labs and follow up pending reports. - CBC with Differential/Platelet - Comprehensive metabolic panel - TSH  5. Anxiety state Anxiety and depressed mood at times. Still not sleeping very well. Uses Trazodone 100 mg 1/2 tablet hs with little response. Feels stress of caring for her husband (had dementia) and family adding to her anxiety and mood swings. Request trial of Duloxetine because it helped a relative (with her diabetic neuropathy pains in  legs and feet, this may be a beneficial switch). Stop the Trazodone and start Duloxetine tomorrow. Call report of response in 1-2 weeks. - DULoxetine (CYMBALTA) 30 MG capsule; Take one capsule by mouth daily for 1 week then increase to 2 daily.  Dispense: 60 capsule; Refill: 3  6. Gastroesophageal reflux disease, esophagitis presence not specified Well controlled on the Omeprazole. No hematochezia, hematemesis or melena. Recheck CBC. - CBC with Differential/Platelet   7. Atony of bladder Unchanged and still using self-catheterization 3 times a day on average. Finds she can't empty bladder at all without catheter. Must use closed system catheter kits 3 times a day due to urinary retention and recurrent UTI's.     [name], PA  [facility]

## 2018-12-04 LAB — LIPID PANEL
Chol/HDL Ratio: 2.6 ratio (ref 0.0–4.4)
Cholesterol, Total: 158 mg/dL (ref 100–199)
HDL: 60 mg/dL (ref >39)
LDL Calculated: 71 mg/dL (ref 0–99)
Triglycerides: 134 mg/dL (ref 0–149)
VLDL Cholesterol Cal: 27 mg/dL (ref 5–40)

## 2018-12-04 LAB — CBC WITH DIFFERENTIAL/PLATELET
Basophils Absolute: 0 10*3/uL (ref 0.0–0.2)
Basos: 0 %
EOS (ABSOLUTE): 0.1 10*3/uL (ref 0.0–0.4)
Eos: 1 %
Hematocrit: 35.2 % (ref 34.0–46.6)
Hemoglobin: 11.5 g/dL (ref 11.1–15.9)
Immature Grans (Abs): 0 10*3/uL (ref 0.0–0.1)
Immature Granulocytes: 0 %
Lymphocytes Absolute: 2.4 10*3/uL (ref 0.7–3.1)
Lymphs: 31 %
MCH: 30.9 pg (ref 26.6–33.0)
MCHC: 32.7 g/dL (ref 31.5–35.7)
MCV: 95 fL (ref 79–97)
Monocytes Absolute: 0.4 10*3/uL (ref 0.1–0.9)
Monocytes: 6 %
Neutrophils Absolute: 4.8 10*3/uL (ref 1.4–7.0)
Neutrophils: 62 %
Platelets: 165 10*3/uL (ref 150–450)
RBC: 3.72 x10E6/uL — ABNORMAL LOW (ref 3.77–5.28)
RDW: 13.2 % (ref 11.7–15.4)
WBC: 7.8 10*3/uL (ref 3.4–10.8)

## 2018-12-04 LAB — COMPREHENSIVE METABOLIC PANEL
ALT: 19 IU/L (ref 0–32)
AST: 17 IU/L (ref 0–40)
Albumin/Globulin Ratio: 1.8 (ref 1.2–2.2)
Albumin: 4.3 g/dL (ref 3.8–4.8)
Alkaline Phosphatase: 60 IU/L (ref 39–117)
BUN/Creatinine Ratio: 15 (ref 12–28)
BUN: 26 mg/dL (ref 8–27)
Bilirubin Total: 0.3 mg/dL (ref 0.0–1.2)
CO2: 16 mmol/L — ABNORMAL LOW (ref 20–29)
Calcium: 9.3 mg/dL (ref 8.7–10.3)
Chloride: 108 mmol/L — ABNORMAL HIGH (ref 96–106)
Creatinine, Ser: 1.72 mg/dL — ABNORMAL HIGH (ref 0.57–1.00)
GFR calc Af Amer: 34 mL/min/{1.73_m2} — ABNORMAL LOW (ref >59)
GFR calc non Af Amer: 30 mL/min/{1.73_m2} — ABNORMAL LOW (ref >59)
Globulin, Total: 2.4 g/dL (ref 1.5–4.5)
Glucose: 139 mg/dL — ABNORMAL HIGH (ref 65–99)
Potassium: 5.4 mmol/L — ABNORMAL HIGH (ref 3.5–5.2)
Sodium: 138 mmol/L (ref 134–144)
Total Protein: 6.7 g/dL (ref 6.0–8.5)

## 2018-12-04 LAB — TSH: TSH: 0.724 u[IU]/mL (ref 0.450–4.500)

## 2018-12-04 LAB — HEMOGLOBIN A1C
Est. average glucose Bld gHb Est-mCnc: 146 mg/dL
Hgb A1c MFr Bld: 6.7 % — ABNORMAL HIGH (ref 4.8–5.6)

## 2018-12-09 DIAGNOSIS — E119 Type 2 diabetes mellitus without complications: Secondary | ICD-10-CM | POA: Diagnosis not present

## 2018-12-09 DIAGNOSIS — Z7984 Long term (current) use of oral hypoglycemic drugs: Secondary | ICD-10-CM | POA: Diagnosis not present

## 2018-12-09 DIAGNOSIS — H25813 Combined forms of age-related cataract, bilateral: Secondary | ICD-10-CM | POA: Diagnosis not present

## 2018-12-09 DIAGNOSIS — H35033 Hypertensive retinopathy, bilateral: Secondary | ICD-10-CM | POA: Diagnosis not present

## 2018-12-09 LAB — HM DIABETES EYE EXAM

## 2018-12-19 DIAGNOSIS — H25043 Posterior subcapsular polar age-related cataract, bilateral: Secondary | ICD-10-CM | POA: Diagnosis not present

## 2018-12-19 DIAGNOSIS — H2512 Age-related nuclear cataract, left eye: Secondary | ICD-10-CM | POA: Diagnosis not present

## 2018-12-19 DIAGNOSIS — H2513 Age-related nuclear cataract, bilateral: Secondary | ICD-10-CM | POA: Diagnosis not present

## 2018-12-19 DIAGNOSIS — H18413 Arcus senilis, bilateral: Secondary | ICD-10-CM | POA: Diagnosis not present

## 2018-12-19 DIAGNOSIS — H25013 Cortical age-related cataract, bilateral: Secondary | ICD-10-CM | POA: Diagnosis not present

## 2019-01-01 DIAGNOSIS — H2512 Age-related nuclear cataract, left eye: Secondary | ICD-10-CM | POA: Diagnosis not present

## 2019-01-01 DIAGNOSIS — Z9842 Cataract extraction status, left eye: Secondary | ICD-10-CM | POA: Diagnosis not present

## 2019-01-01 DIAGNOSIS — Z961 Presence of intraocular lens: Secondary | ICD-10-CM | POA: Diagnosis not present

## 2019-01-02 ENCOUNTER — Other Ambulatory Visit: Payer: Self-pay | Admitting: Family Medicine

## 2019-01-02 DIAGNOSIS — F411 Generalized anxiety disorder: Secondary | ICD-10-CM

## 2019-01-02 DIAGNOSIS — E0842 Diabetes mellitus due to underlying condition with diabetic polyneuropathy: Secondary | ICD-10-CM

## 2019-01-02 DIAGNOSIS — H2511 Age-related nuclear cataract, right eye: Secondary | ICD-10-CM | POA: Diagnosis not present

## 2019-01-07 ENCOUNTER — Other Ambulatory Visit: Payer: Self-pay

## 2019-01-07 ENCOUNTER — Ambulatory Visit
Admission: RE | Admit: 2019-01-07 | Discharge: 2019-01-07 | Disposition: A | Discharge status: Home / Self Care | Payer: Medicare HMO | Source: Ambulatory Visit | Attending: Family Medicine | Admitting: Family Medicine

## 2019-01-07 DIAGNOSIS — Z1231 Encounter for screening mammogram for malignant neoplasm of breast: Secondary | ICD-10-CM | POA: Insufficient documentation

## 2019-01-22 DIAGNOSIS — H25011 Cortical age-related cataract, right eye: Secondary | ICD-10-CM | POA: Diagnosis not present

## 2019-01-22 DIAGNOSIS — H2511 Age-related nuclear cataract, right eye: Secondary | ICD-10-CM | POA: Diagnosis not present

## 2019-01-27 ENCOUNTER — Other Ambulatory Visit: Payer: Self-pay | Admitting: Family Medicine

## 2019-01-27 DIAGNOSIS — R69 Illness, unspecified: Secondary | ICD-10-CM | POA: Diagnosis not present

## 2019-01-31 DIAGNOSIS — I10 Essential (primary) hypertension: Secondary | ICD-10-CM | POA: Diagnosis not present

## 2019-01-31 DIAGNOSIS — R079 Chest pain, unspecified: Secondary | ICD-10-CM | POA: Diagnosis not present

## 2019-02-03 ENCOUNTER — Other Ambulatory Visit: Payer: Self-pay

## 2019-02-03 ENCOUNTER — Encounter: Payer: Self-pay | Admitting: Physician Assistant

## 2019-02-03 ENCOUNTER — Ambulatory Visit (INDEPENDENT_AMBULATORY_CARE_PROVIDER_SITE_OTHER): Payer: Medicare HMO | Admitting: Physician Assistant

## 2019-02-03 VITALS — BP 130/84 | HR 74 | Temp 96.8°F | Resp 16 | Wt 176.6 lb

## 2019-02-03 DIAGNOSIS — R634 Abnormal weight loss: Secondary | ICD-10-CM | POA: Diagnosis not present

## 2019-02-03 DIAGNOSIS — E1122 Type 2 diabetes mellitus with diabetic chronic kidney disease: Secondary | ICD-10-CM

## 2019-02-03 DIAGNOSIS — R1013 Epigastric pain: Secondary | ICD-10-CM | POA: Diagnosis not present

## 2019-02-03 DIAGNOSIS — R300 Dysuria: Secondary | ICD-10-CM

## 2019-02-03 DIAGNOSIS — N183 Chronic kidney disease, stage 3 unspecified: Secondary | ICD-10-CM

## 2019-02-03 DIAGNOSIS — E0842 Diabetes mellitus due to underlying condition with diabetic polyneuropathy: Secondary | ICD-10-CM | POA: Diagnosis not present

## 2019-02-03 DIAGNOSIS — L299 Pruritus, unspecified: Secondary | ICD-10-CM

## 2019-02-03 DIAGNOSIS — R630 Anorexia: Secondary | ICD-10-CM | POA: Diagnosis not present

## 2019-02-03 DIAGNOSIS — R748 Abnormal levels of other serum enzymes: Secondary | ICD-10-CM | POA: Diagnosis not present

## 2019-02-03 DIAGNOSIS — R81 Glycosuria: Secondary | ICD-10-CM

## 2019-02-03 LAB — POCT URINALYSIS DIPSTICK
Bilirubin, UA: NEGATIVE
Glucose, UA: POSITIVE — AB
Ketones, UA: NEGATIVE
Nitrite, UA: POSITIVE
Protein, UA: NEGATIVE
Spec Grav, UA: 1.02 (ref 1.010–1.025)
Urobilinogen, UA: 0.2 E.U./dL
pH, UA: 6 (ref 5.0–8.0)

## 2019-02-03 MED ORDER — SULFAMETHOXAZOLE-TRIMETHOPRIM 800-160 MG PO TABS: 1.0000 | ORAL_TABLET | Freq: Two times a day (BID) | ORAL | 0 refills | Status: DC

## 2019-02-03 MED ORDER — HYDROXYZINE HCL 10 MG PO TABS: 10.0000 mg | ORAL_TABLET | Freq: Three times a day (TID) | ORAL | 0 refills | Status: DC | PRN

## 2019-02-03 NOTE — Progress Notes (Signed)
[name] Female    DOB: 1950-10-03   68 y.o.   MRN: [id number] Visit Date: 02/03/2019  Today's Provider: Mar Daring, PA-C   Chief Complaint  Patient presents with  . Dysuria   Subjective:     Dysuria  This is a new problem. The current episode started in the past 7 days (Started on Friday). The problem has been unchanged. Quality: She uses a catheter. There has been no fever. Associated symptoms include urgency. Pertinent negatives include no discharge, flank pain, frequency, nausea or vomiting. Associated symptoms comments: Odor,cloudy. Treatments tried: Cranberry pills. The treatment provided no relief. Her past medical history is significant for catheterization and recurrent UTIs.    Patient has lost 10 pounds in the last month and is not dieting. Reports that her appetite has changed for the past couple of months. Patient reports that on Saturday she had abdominal pain all the way to her chest so she took and anti acid but it didn't help so she took her blood pressure and it was 176/69. Reports that she went to the fire department and it 140/? and they did an ekg but it was normal and they recheck her blood pressure and it was back at 162/69. She reports that she is not having the abdominal pain anymore.  No Known Allergies   Current Outpatient Medications:  .  aspirin EC 81 MG tablet, Take 81 mg by mouth daily. , Disp: , Rfl:  .  Cranberry 125 MG TABS, Take 1 tablet by mouth daily., Disp: , Rfl:  .  DULoxetine (CYMBALTA) 60 MG capsule, TAKE ONE CAPSULE BY MOUTH DAILY., Disp: 30 capsule, Rfl: 3 .  ferrous sulfate 325 (65 FE) MG tablet, Take 1 tablet by mouth daily., Disp: , Rfl:  .  glipiZIDE (GLUCOTROL) 10 MG tablet, Take 1 tablet (10 mg total) by mouth 2 (two) times daily., Disp: 180 tablet, Rfl: 3 .  lisinopril-hydrochlorothiazide (ZESTORETIC) 10-12.5 MG tablet, Take 1 tablet by mouth once daily, Disp: 90 tablet, Rfl: 0 .  lovastatin (MEVACOR) 40  MG tablet, Take 1 tablet by mouth once daily, Disp: 90 tablet, Rfl: 0 .  metFORMIN (GLUCOPHAGE) 500 MG tablet, Take 1 tablet (500 mg total) by mouth 2 (two) times daily with a meal., Disp: 180 tablet, Rfl: 3 .  omeprazole (PRILOSEC) 40 MG capsule, Take 40 mg by mouth daily. , Disp: , Rfl:  .  ONETOUCH DELICA LANCETS 81X MISC, USE ONE  TO CHECK GLUCOSE ONCE DAILY, Disp: 100 each, Rfl: 12 .  ONETOUCH ULTRA test strip, USE 1 STRIP TO CHECK GLUCOSE ONCE DAILY, Disp: 50 each, Rfl: 11 .  trimethoprim (TRIMPEX) 100 MG tablet, Take 1 tablet (100 mg total) by mouth daily., Disp: 30 tablet, Rfl: 6 .  BESIVANCE 0.6 % SUSP, INSTILL 1 DROP INTO RIGHT EYE THREE TIMES DAILY, Disp: , Rfl:  .  DUREZOL 0.05 % EMUL, INSTILL 1 DROP INTO RIGHT EYE THREE TIMES DAILY AS DIRECTED, Disp: , Rfl:  .  PROLENSA 0.07 % SOLN, , Disp: , Rfl:   Review of Systems  Constitutional: Positive for appetite change, fatigue and unexpected weight change.  HENT: Negative.   Respiratory: Negative.   Cardiovascular: Negative.   Gastrointestinal: Positive for abdominal pain. Negative for diarrhea, nausea and vomiting.  Genitourinary: Positive for dysuria and urgency. Negative for flank pain and frequency.  Musculoskeletal: Positive for back pain.    Social History   Tobacco Use  . Smoking  status: Never Smoker  . Smokeless tobacco: Never Used  Substance Use Topics  . Alcohol use: No    Alcohol/week: 0.0 standard drinks      Objective:   BP 130/84 (BP Location: Left Arm, Patient Position: Sitting, Cuff Size: Large)   Pulse 74   Temp (!) 96.8 F (36 C) (Other (Comment)) Comment (Src): forehead  Resp 16   Wt 176 lb 9.6 oz (80.1 kg)   BMI 33.37 kg/m  Vitals:   02/03/19 0927  BP: 130/84  Pulse: 74  Resp: 16  Temp: (!) 96.8 F (36 C)  TempSrc: Other (Comment)  Weight: 176 lb 9.6 oz (80.1 kg)     Physical Exam Constitutional:      General: She is not in acute distress.    Appearance: Normal appearance. She is  well-developed. She is obese. She is not ill-appearing or diaphoretic.  Cardiovascular:     Rate and Rhythm: Normal rate and regular rhythm.     Heart sounds: Normal heart sounds. No murmur. No friction rub. No gallop.   Pulmonary:     Effort: Pulmonary effort is normal. No respiratory distress.     Breath sounds: Normal breath sounds. No wheezing or rales.  Abdominal:     General: Bowel sounds are normal. There is no distension.     Palpations: Abdomen is soft. There is no mass.     Tenderness: There is no abdominal tenderness. There is no guarding or rebound.  Musculoskeletal:     Right lower leg: No edema.     Left lower leg: No edema.  Skin:    General: Skin is warm and dry.  Neurological:     Mental Status: She is alert and oriented to person, place, and time.      Results for orders placed or performed in visit on 02/03/19  POCT urinalysis dipstick  Result Value Ref Range   Color, UA Yellow    Clarity, UA Cloudy    Glucose, UA Positive (A) Negative   Bilirubin, UA Negative    Ketones, UA Negative    Spec Grav, UA 1.020 1.010 - 1.025   Blood, UA Trace    pH, UA 6.0 5.0 - 8.0   Protein, UA Negative Negative   Urobilinogen, UA 0.2 0.2 or 1.0 E.U./dL   Nitrite, UA Positive    Leukocytes, UA Trace (A) Negative   Appearance     Odor         Assessment & Plan    1. Dysuria UA does have trace leuks, but more concerning as she is starting to have some mild glucosuria as well. Have been monitoring her renal function for a while due to her diabetes, but now she is possibly having filtration issues and also starting to itch all over. Will treat empirically for UTI (patient has to catheterize herself TID so higher risk) with Bactrim as below. Culture will be sent. Will check labs as below and f/u pending results. Referral to Nephrology also placed for further evaluation.  - POCT urinalysis dipstick - Urine Culture - CBC w/Diff/Platelet - Comprehensive Metabolic Panel (CMET)  - Lipase - sulfamethoxazole-trimethoprim (BACTRIM DS) 800-160 MG tablet; Take 1 tablet by mouth 2 (two) times daily.  Dispense: 20 tablet; Refill: 0  2. Diabetes mellitus due to underlying condition with diabetic polyneuropathy, without long-term current use of insulin ([place]) Had been stable with A1c of 6.7 in June 2020. Will check labs as below and f/u pending results. - CBC w/Diff/Platelet - Comprehensive  Metabolic Panel (CMET) - Lipase  3. Epigastric pain Improved now. Will check labs as below and f/u pending results. - CBC w/Diff/Platelet - Comprehensive Metabolic Panel (CMET) - Lipase  4. Loss of appetite New. Will check labs as below and f/u pending results. Monitoring.  - CBC w/Diff/Platelet - Comprehensive Metabolic Panel (CMET) - Lipase - Ambulatory referral to Nephrology  5. Weight loss 10 pounds since end of June, not trying. Just reports she has no appetite. - CBC w/Diff/Platelet - Comprehensive Metabolic Panel (CMET) - Lipase - Ambulatory referral to Nephrology  6. Type 2 diabetes mellitus with stage 3 chronic kidney disease, without long-term current use of insulin (HCC) A1c had been 6.7 in June. Referring to Nephrology for possible progression of kidney disease.  - CBC w/Diff/Platelet - Comprehensive Metabolic Panel (CMET) - Lipase - Ambulatory referral to Nephrology  7. Itching Hydroxyzine given as below. Advised of drowsiness precautions.  - hydrOXYzine (ATARAX/VISTARIL) 10 MG tablet; Take 1 tablet (10 mg total) by mouth 3 (three) times daily as needed.  Dispense: 30 tablet; Refill: 0 - Ambulatory referral to Nephrology  8. Glucosuria See above medical treatment plan. - Ambulatory referral to Nephrology     Mar Daring, PA-C  [facility]

## 2019-02-04 LAB — COMPREHENSIVE METABOLIC PANEL
ALT: 138 IU/L — ABNORMAL HIGH (ref 0–32)
AST: 32 IU/L (ref 0–40)
Albumin/Globulin Ratio: 1.7 (ref 1.2–2.2)
Albumin: 4.2 g/dL (ref 3.8–4.8)
Alkaline Phosphatase: 299 IU/L — ABNORMAL HIGH (ref 39–117)
BUN/Creatinine Ratio: 15 (ref 12–28)
BUN: 30 mg/dL — ABNORMAL HIGH (ref 8–27)
Bilirubin Total: 1 mg/dL (ref 0.0–1.2)
CO2: 19 mmol/L — ABNORMAL LOW (ref 20–29)
Calcium: 8.6 mg/dL — ABNORMAL LOW (ref 8.7–10.3)
Chloride: 101 mmol/L (ref 96–106)
Creatinine, Ser: 2.05 mg/dL — ABNORMAL HIGH (ref 0.57–1.00)
GFR calc Af Amer: 28 mL/min/{1.73_m2} — ABNORMAL LOW (ref >59)
GFR calc non Af Amer: 24 mL/min/{1.73_m2} — ABNORMAL LOW (ref >59)
Globulin, Total: 2.5 g/dL (ref 1.5–4.5)
Glucose: 234 mg/dL — ABNORMAL HIGH (ref 65–99)
Potassium: 4.6 mmol/L (ref 3.5–5.2)
Sodium: 135 mmol/L (ref 134–144)
Total Protein: 6.7 g/dL (ref 6.0–8.5)

## 2019-02-04 LAB — LIPASE: Lipase: 98 U/L — ABNORMAL HIGH (ref 14–72)

## 2019-02-04 LAB — CBC WITH DIFFERENTIAL/PLATELET
Basophils Absolute: 0 10*3/uL (ref 0.0–0.2)
Basos: 0 %
EOS (ABSOLUTE): 0.1 10*3/uL (ref 0.0–0.4)
Eos: 1 %
Hematocrit: 35.3 % (ref 34.0–46.6)
Hemoglobin: 11.9 g/dL (ref 11.1–15.9)
Immature Grans (Abs): 0 10*3/uL (ref 0.0–0.1)
Immature Granulocytes: 0 %
Lymphocytes Absolute: 2.2 10*3/uL (ref 0.7–3.1)
Lymphs: 33 %
MCH: 31.2 pg (ref 26.6–33.0)
MCHC: 33.7 g/dL (ref 31.5–35.7)
MCV: 93 fL (ref 79–97)
Monocytes Absolute: 0.4 10*3/uL (ref 0.1–0.9)
Monocytes: 6 %
Neutrophils Absolute: 3.9 10*3/uL (ref 1.4–7.0)
Neutrophils: 60 %
Platelets: 180 10*3/uL (ref 150–450)
RBC: 3.81 x10E6/uL (ref 3.77–5.28)
RDW: 11.9 % (ref 11.7–15.4)
WBC: 6.6 10*3/uL (ref 3.4–10.8)

## 2019-02-06 LAB — URINE CULTURE

## 2019-02-06 NOTE — Addendum Note (Signed)
Addended by: Mar Daring on: 02/06/2019 01:39 PM   Modules accepted: Orders

## 2019-02-09 DIAGNOSIS — I959 Hypotension, unspecified: Secondary | ICD-10-CM | POA: Diagnosis not present

## 2019-02-09 DIAGNOSIS — K8033 Calculus of bile duct with acute cholangitis with obstruction: Secondary | ICD-10-CM | POA: Diagnosis not present

## 2019-02-09 DIAGNOSIS — R109 Unspecified abdominal pain: Secondary | ICD-10-CM | POA: Diagnosis not present

## 2019-02-09 DIAGNOSIS — K851 Biliary acute pancreatitis without necrosis or infection: Secondary | ICD-10-CM | POA: Insufficient documentation

## 2019-02-09 DIAGNOSIS — R932 Abnormal findings on diagnostic imaging of liver and biliary tract: Secondary | ICD-10-CM | POA: Diagnosis not present

## 2019-02-09 DIAGNOSIS — R634 Abnormal weight loss: Secondary | ICD-10-CM | POA: Diagnosis not present

## 2019-02-09 DIAGNOSIS — E1165 Type 2 diabetes mellitus with hyperglycemia: Secondary | ICD-10-CM | POA: Diagnosis not present

## 2019-02-09 DIAGNOSIS — K8050 Calculus of bile duct without cholangitis or cholecystitis without obstruction: Secondary | ICD-10-CM | POA: Diagnosis not present

## 2019-02-09 DIAGNOSIS — N179 Acute kidney failure, unspecified: Secondary | ICD-10-CM | POA: Diagnosis not present

## 2019-02-09 DIAGNOSIS — Z9049 Acquired absence of other specified parts of digestive tract: Secondary | ICD-10-CM | POA: Diagnosis not present

## 2019-02-09 DIAGNOSIS — R1084 Generalized abdominal pain: Secondary | ICD-10-CM | POA: Diagnosis not present

## 2019-02-09 DIAGNOSIS — R1011 Right upper quadrant pain: Secondary | ICD-10-CM | POA: Diagnosis not present

## 2019-02-09 DIAGNOSIS — R7989 Other specified abnormal findings of blood chemistry: Secondary | ICD-10-CM | POA: Diagnosis not present

## 2019-02-09 DIAGNOSIS — R0902 Hypoxemia: Secondary | ICD-10-CM | POA: Diagnosis not present

## 2019-02-09 DIAGNOSIS — R571 Hypovolemic shock: Secondary | ICD-10-CM | POA: Diagnosis not present

## 2019-02-09 DIAGNOSIS — R531 Weakness: Secondary | ICD-10-CM | POA: Diagnosis not present

## 2019-02-09 DIAGNOSIS — G8918 Other acute postprocedural pain: Secondary | ICD-10-CM | POA: Diagnosis not present

## 2019-02-09 DIAGNOSIS — R112 Nausea with vomiting, unspecified: Secondary | ICD-10-CM | POA: Diagnosis not present

## 2019-02-09 DIAGNOSIS — I129 Hypertensive chronic kidney disease with stage 1 through stage 4 chronic kidney disease, or unspecified chronic kidney disease: Secondary | ICD-10-CM | POA: Diagnosis not present

## 2019-02-09 DIAGNOSIS — Z9181 History of falling: Secondary | ICD-10-CM | POA: Diagnosis not present

## 2019-02-09 DIAGNOSIS — E875 Hyperkalemia: Secondary | ICD-10-CM | POA: Diagnosis not present

## 2019-02-09 DIAGNOSIS — K8031 Calculus of bile duct with cholangitis, unspecified, with obstruction: Secondary | ICD-10-CM | POA: Diagnosis not present

## 2019-02-09 DIAGNOSIS — E872 Acidosis: Secondary | ICD-10-CM | POA: Diagnosis not present

## 2019-02-09 DIAGNOSIS — D649 Anemia, unspecified: Secondary | ICD-10-CM | POA: Diagnosis not present

## 2019-02-09 DIAGNOSIS — N170 Acute kidney failure with tubular necrosis: Secondary | ICD-10-CM | POA: Diagnosis not present

## 2019-02-09 DIAGNOSIS — R9431 Abnormal electrocardiogram [ECG] [EKG]: Secondary | ICD-10-CM | POA: Diagnosis not present

## 2019-02-09 DIAGNOSIS — N3090 Cystitis, unspecified without hematuria: Secondary | ICD-10-CM | POA: Diagnosis not present

## 2019-02-09 DIAGNOSIS — K8309 Other cholangitis: Secondary | ICD-10-CM | POA: Diagnosis not present

## 2019-02-09 DIAGNOSIS — N183 Chronic kidney disease, stage 3 unspecified: Secondary | ICD-10-CM | POA: Insufficient documentation

## 2019-02-09 DIAGNOSIS — Z66 Do not resuscitate: Secondary | ICD-10-CM | POA: Diagnosis not present

## 2019-02-09 DIAGNOSIS — Z452 Encounter for adjustment and management of vascular access device: Secondary | ICD-10-CM | POA: Diagnosis not present

## 2019-02-09 DIAGNOSIS — Z20828 Contact with and (suspected) exposure to other viral communicable diseases: Secondary | ICD-10-CM | POA: Diagnosis not present

## 2019-02-09 DIAGNOSIS — A419 Sepsis, unspecified organism: Secondary | ICD-10-CM | POA: Diagnosis not present

## 2019-02-09 DIAGNOSIS — R1013 Epigastric pain: Secondary | ICD-10-CM | POA: Diagnosis not present

## 2019-02-09 DIAGNOSIS — R6521 Severe sepsis with septic shock: Secondary | ICD-10-CM | POA: Diagnosis not present

## 2019-02-12 ENCOUNTER — Telehealth: Payer: Self-pay

## 2019-02-12 MED ORDER — POLYETHYLENE GLYCOL 3350 17 G PO PACK
17.00 | PACK | ORAL | Status: DC
Start: 2019-02-13 — End: 2019-02-12

## 2019-02-12 MED ORDER — HEPARIN SODIUM (PORCINE) 5000 UNIT/ML IJ SOLN
5000.00 | INTRAMUSCULAR | Status: DC
Start: 2019-02-12 — End: 2019-02-12

## 2019-02-12 MED ORDER — DULOXETINE HCL 60 MG PO CPEP
60.00 | ORAL_CAPSULE | ORAL | Status: DC
Start: 2019-02-13 — End: 2019-02-12

## 2019-02-12 MED ORDER — PIPERACILLIN-TAZOBACTAM 3.375 G IVPB
3.38 | INTRAVENOUS | Status: DC
Start: 2019-02-12 — End: 2019-02-12

## 2019-02-12 MED ORDER — GENERIC EXTERNAL MEDICATION
Status: DC
Start: 2019-02-13 — End: 2019-02-12

## 2019-02-12 NOTE — Telephone Encounter (Signed)
Patient's daughter called on Sunday and spoke with one of the Nurses from the call center and chief complaint was Pale Skin-caller reported vomiting for 2 days, can barely walk and very weak-[atient went to [facility] ED and was sent from there to [facility] in [place]. Per patient she has a gallstone obstruction. She reports that she still admitted and that she doesn't think she will be coming here anymore because her daughter doesn't want her to come back because they feel she was neglected and she almost died.

## 2019-02-12 NOTE — Telephone Encounter (Signed)
I am sorry that is how they feel. We were doing everything under our power and she had [place] ordered. We always tell patients if they worsen to go to the ER, which I am glad she did.

## 2019-02-13 ENCOUNTER — Ambulatory Visit: Payer: Medicare HMO

## 2019-02-21 DIAGNOSIS — N183 Chronic kidney disease, stage 3 unspecified: Secondary | ICD-10-CM | POA: Diagnosis not present

## 2019-02-21 DIAGNOSIS — K859 Acute pancreatitis without necrosis or infection, unspecified: Secondary | ICD-10-CM | POA: Diagnosis not present

## 2019-02-21 DIAGNOSIS — K219 Gastro-esophageal reflux disease without esophagitis: Secondary | ICD-10-CM | POA: Diagnosis not present

## 2019-02-21 DIAGNOSIS — I129 Hypertensive chronic kidney disease with stage 1 through stage 4 chronic kidney disease, or unspecified chronic kidney disease: Secondary | ICD-10-CM | POA: Diagnosis not present

## 2019-02-21 DIAGNOSIS — M6281 Muscle weakness (generalized): Secondary | ICD-10-CM | POA: Diagnosis not present

## 2019-02-21 DIAGNOSIS — R69 Illness, unspecified: Secondary | ICD-10-CM | POA: Diagnosis not present

## 2019-02-21 DIAGNOSIS — E1122 Type 2 diabetes mellitus with diabetic chronic kidney disease: Secondary | ICD-10-CM | POA: Diagnosis not present

## 2019-02-21 DIAGNOSIS — K8309 Other cholangitis: Secondary | ICD-10-CM | POA: Diagnosis not present

## 2019-02-21 DIAGNOSIS — D631 Anemia in chronic kidney disease: Secondary | ICD-10-CM | POA: Diagnosis not present

## 2019-02-24 ENCOUNTER — Other Ambulatory Visit: Payer: Self-pay

## 2019-02-24 ENCOUNTER — Ambulatory Visit (INDEPENDENT_AMBULATORY_CARE_PROVIDER_SITE_OTHER): Payer: Medicare HMO | Admitting: Family Medicine

## 2019-02-24 ENCOUNTER — Encounter: Payer: Self-pay | Admitting: Family Medicine

## 2019-02-24 VITALS — BP 112/74 | HR 66 | Temp 97.6°F | Wt 177.0 lb

## 2019-02-24 DIAGNOSIS — R8281 Pyuria: Secondary | ICD-10-CM

## 2019-02-24 DIAGNOSIS — E1169 Type 2 diabetes mellitus with other specified complication: Secondary | ICD-10-CM | POA: Diagnosis not present

## 2019-02-24 DIAGNOSIS — K851 Biliary acute pancreatitis without necrosis or infection: Secondary | ICD-10-CM | POA: Diagnosis not present

## 2019-02-24 DIAGNOSIS — N312 Flaccid neuropathic bladder, not elsewhere classified: Secondary | ICD-10-CM

## 2019-02-24 DIAGNOSIS — D649 Anemia, unspecified: Secondary | ICD-10-CM

## 2019-02-24 DIAGNOSIS — R69 Illness, unspecified: Secondary | ICD-10-CM | POA: Diagnosis not present

## 2019-02-24 LAB — POCT URINALYSIS DIPSTICK
Bilirubin, UA: NEGATIVE
Glucose, UA: NEGATIVE
Ketones, UA: NEGATIVE
Nitrite, UA: NEGATIVE
Protein, UA: POSITIVE — AB
Spec Grav, UA: 1.025 (ref 1.010–1.025)
Urobilinogen, UA: 0.2 E.U./dL
pH, UA: 6 (ref 5.0–8.0)

## 2019-02-24 MED ORDER — ONETOUCH DELICA LANCETS 33G MISC: 12 refills | Status: DC

## 2019-02-24 MED ORDER — ONETOUCH ULTRA VI STRP: 1.0000 | ORAL_STRIP | Freq: Three times a day (TID) | 11 refills | Status: DC

## 2019-02-24 NOTE — Progress Notes (Signed)
Cynthia Sims  MRN: [id number] DOB: Nov 27, 1950  Subjective:  HPI   The patient is a 68 year old female who presents for follow up after recent hospitalization.  The patient was admitted to [facility] from 02/09/2019-02/16/2019.  She presented to the hospital with 2 weeks of abdominal pain, N/V, weight loss, and anorexia.  She was found to have gall stone pancreatitis with acute cholangitis that was treated with ERCP and antibiotics.   She was discharged home with Home Health and/or PT/OT.  Her medication changes at the time of discharge were as follows: Discharge Medications:  Your Medication List  STOP taking these medications   lisinopriL-hydrochlorothiazide 10-12.5 mg per tablet  metFORMIN 500 MG tablet  sulfamethoxazole-trimethoprim 800-160 mg per tablet  trimethoprim 100 mg tablet  START taking these medications   carvediloL 12.5 MG tablet Take 1 tablet (12.5 mg total) by mouth Two (2) times a day.  ciprofloxacin HCl 500 MG tablet Take 1 tablet (500 mg total) by mouth daily for 3 days.  FREESTYLE LITE STRIPS Strp by Other route daily. Test daily before breakfast.  insulin syringe-needle U-100 0.3 mL 31 gauge x 5/16" (8 mm) Syrg Injection Frequency is 1 time per day; Dx Code: Type 2 Diabetes uncontrolled (E11.65)  lancets Misc 50 each by Other route daily. Test daily before breakfast.  magnesium oxide 400 mg (241.3 mg magnesium) tablet Take 1 tablet (400 mg total) by mouth daily.  metroNIDAZOLE 500 MG tablet Take 1 tablet (500 mg total) by mouth Three (3) times a day for 3 days.   CONTINUE taking these medications  aspirin 81 MG tablet Take 81 mg by mouth.  BESIVANCE 0.6 % suspension INSTILL 1 DROP INTO RIGHT EYE THREE TIMES DAILY  DULoxetine 60 MG capsule TAKE ONE CAPSULE BY MOUTH DAILY.  DurezoL 0.05 % Drop INSTILL 1 DROP INTO RIGHT EYE THREE TIMES DAILY AS DIRECTED  glipiZIDE 10 MG tablet Take 10 mg by mouth Two (2) times a day.   hydrOXYzine 10 MG tablet Take 10 mg by mouth Three (3) times a day as needed.  lovastatin 40 MG tablet Take 1 tablet by mouth once daily  PROLENSA 0.07 % Drop Administer 1 drop into the left eye nightly.    Patient Active Problem List   Diagnosis Date Noted  . Can't get food down 09/13/2015  . ALT (SGPT) level raised 09/13/2015  . Acid reflux 09/13/2015  . H/O adenomatous polyp of colon 09/13/2015  . Anxiety 01/14/2015  . Hypertension 01/14/2015  . Chronic kidney disease (CKD), stage III (moderate) ([place]) 01/13/2015  . Cannot sleep 02/26/2009  . Anxiety state 02/26/2009  . 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 Diabetes ([place]) 12/14/2005  . 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 Menopausal symptom 12/14/2005    Past Medical History:  Diagnosis Date  . ALT (SGPT) level raised   . Anemia   . Anxiety   . Arthritis   . Depression   . Diabetes mellitus without complication ([place])   . Elevated cholesterol   . GERD (gastroesophageal reflux disease)   . Hemorrhoids   . Hypertension   . Kidney disease    Past Surgical History:  Procedure Laterality Date  . CESAREAN SECTION  1983  . CESAREAN SECTION    . CHOLECYSTECTOMY    . COLONOSCOPY WITH PROPOFOL N/A 10/29/2015   Procedure: COLONOSCOPY WITH  PROPOFOL;  Surgeon: Manya Silvas, MD;  Location: [facility] ENDOSCOPY;  Service: Endoscopy;  Laterality: N/A;  . COLONOSCOPY WITH PROPOFOL N/A 11/07/2017   Procedure: COLONOSCOPY WITH PROPOFOL;  Surgeon: Toledo, Benay Pike, MD;  Location: [facility] ENDOSCOPY;  Service: Gastroenterology;  Laterality: N/A;  . DILATION AND CURETTAGE OF UTERUS  2001  . ESOPHAGOGASTRODUODENOSCOPY (EGD) WITH PROPOFOL N/A 10/29/2015   Procedure: ESOPHAGOGASTRODUODENOSCOPY (EGD) WITH PROPOFOL;  Surgeon: Manya Silvas, MD;  Location: [facility] ENDOSCOPY;  Service: Endoscopy;  Laterality: N/A;    Family History  Problem Relation Age of Onset  . Alzheimer's disease Mother   . COPD Mother   . Breast cancer Mother 94  . Osteoporosis Mother   . Heart attack Father   . Lupus Father   . Diabetes Father   . Hypertension Father   . Diabetes Sister   . COPD Maternal Grandmother   . Diabetes Paternal Grandfather   . Diabetes Sister   . Diabetes Sister     Social History   Socioeconomic History  . Marital status: Married    Spouse name: Not on file  . Number of children: 5  . Years of education: Not on file  . Highest education level: 8th grade  Occupational History  . Occupation: retired  Scientific laboratory technician  . Financial resource strain: Hard  . Food insecurity    Worry: Never true    Inability: Never true  . Transportation needs    Medical: No    Non-medical: No  Tobacco Use  . Smoking status: Never Smoker  . Smokeless tobacco: Never Used  Substance and Sexual Activity  . Alcohol use: No    Alcohol/week: 0.0 standard drinks  . Drug use: No  . Sexual activity: Yes    Birth control/protection: Post-menopausal  Lifestyle  . Physical activity    Days per week: 0 days    Minutes per session: 0 min  . Stress: Not on file  Relationships  . Social Herbalist on phone: Patient refused    Gets together: Patient refused    Attends religious service: Patient refused    Active member of club or organization: Patient refused    Attends meetings of clubs or organizations: Patient refused    Relationship status: Patient refused  . Intimate partner violence    Fear of current or ex partner: Patient refused    Emotionally abused: Patient refused    Physically abused: Patient refused    Forced sexual activity: Patient refused  Other Topics Concern  . Not on file  Social History Narrative   Applied for food stamps. Pt is living off social security money and is raising one of her grandchildren.     Outpatient Encounter Medications as of 02/24/2019  Medication Sig   . aspirin EC 81 MG tablet Take 81 mg by mouth daily.   Marland Kitchen BESIVANCE 0.6 % SUSP INSTILL 1 DROP INTO RIGHT EYE THREE TIMES DAILY  . carvedilol (COREG) 12.5 MG tablet Take 12.5 mg by mouth 2 (two) times daily with a meal.  . DULoxetine (CYMBALTA) 60 MG capsule TAKE ONE CAPSULE BY MOUTH DAILY.  . DUREZOL 0.05 % EMUL INSTILL 1 DROP INTO RIGHT EYE THREE TIMES DAILY AS DIRECTED  . glipiZIDE (GLUCOTROL) 10 MG tablet Take 1 tablet (10 mg total) by mouth 2 (two) times daily.  . hydrOXYzine (ATARAX/VISTARIL) 10 MG tablet Take 1 tablet (10 mg total) by mouth 3 (three) times daily as needed.  . Insulin Glargine (BASAGLAR KWIKPEN) 100  UNIT/ML SOPN Inject into the skin daily.  Marland Kitchen lovastatin (MEVACOR) 40 MG tablet Take 1 tablet by mouth once daily  . magnesium oxide (MAG-OX) 400 MG tablet Take 400 mg by mouth daily.  Glory Rosebush DELICA LANCETS 95A MISC USE ONE  TO CHECK GLUCOSE ONCE DAILY  . ONETOUCH ULTRA test strip USE 1 STRIP TO CHECK GLUCOSE ONCE DAILY  . PROLENSA 0.07 % SOLN   . Cranberry 125 MG TABS Take 1 tablet by mouth daily.  . ferrous sulfate 325 (65 FE) MG tablet Take 1 tablet by mouth daily.  Marland Kitchen lisinopril-hydrochlorothiazide (ZESTORETIC) 10-12.5 MG tablet Take 1 tablet by mouth once daily (Patient not taking: Reported on 02/24/2019)  . metFORMIN (GLUCOPHAGE) 500 MG tablet Take 1 tablet (500 mg total) by mouth 2 (two) times daily with a meal. (Patient not taking: Reported on 02/24/2019)  . omeprazole (PRILOSEC) 40 MG capsule Take 40 mg by mouth daily.   Marland Kitchen sulfamethoxazole-trimethoprim (BACTRIM DS) 800-160 MG tablet Take 1 tablet by mouth 2 (two) times daily. (Patient not taking: Reported on 02/24/2019)  . trimethoprim (TRIMPEX) 100 MG tablet Take 1 tablet (100 mg total) by mouth daily. (Patient not taking: Reported on 02/24/2019)   No facility-administered encounter medications on file as of 02/24/2019.     No Known Allergies  Review of Systems  Constitutional: Positive for malaise/fatigue. Negative  for fever.  HENT: Negative for congestion and sore throat.   Respiratory: Negative for cough and wheezing.   Gastrointestinal: Negative for abdominal pain, constipation, diarrhea, heartburn, nausea and vomiting.  Neurological: Positive for weakness.    Objective:  BP 112/74 (BP Location: Right Arm, Patient Position: Sitting, Cuff Size: Normal)   Pulse 66   Temp 97.6 F (36.4 C) (Oral)   Wt 177 lb (80.3 kg)   SpO2 99%   BMI 33.44 kg/m   Physical Exam  Constitutional: She is oriented to person, place, and time and well-developed, well-nourished, and in no distress.  HENT:  Head: Normocephalic.  Eyes: Conjunctivae are normal.  Neck: Neck supple.  Cardiovascular: Normal rate and regular rhythm.  Pulmonary/Chest: Effort normal and breath sounds normal.  Abdominal: Soft. Bowel sounds are normal. There is no abdominal tenderness.  Neurological: She is alert and oriented to person, place, and time.  Skin: No rash noted.  Psychiatric: Mood, affect and judgment normal.    Assessment and Plan :  1. Gallstone pancreatitis Had abdominal pain for 2 weeks prior to hospitalization from 02-09-19 to 02-16-19. Found gallstone obstruction of common bile duct despite cholecystectomy in 2013. Feeling better today. Will check follow up labs and recheck prn. - CBC with Differential/Platelet - Comprehensive metabolic panel - Amylase  2. Bladder atony Increase self-catheterization to 4 times a day now since hospitalization. Pyuria noted on urinalysis today. Will get C&S and continue catheter. No urine passage without catheter for several years. - POCT urinalysis dipstick  3. Anemia, unspecified type Hgb 11.9 on 02-03-19. Will check CBC and continue iron supplement if needed. Energy level is still low but no hematuria, melena or hematemesis. - CBC with Differential/Platelet  4. Type 2 diabetes mellitus with other specified complication, unspecified whether long term insulin use (HCC) Since  hospitalization for cholangitis and pancreatitis secondary to common bile duct stone, has had FBS in the 170-300 range. Presently taking the Glipizide 10 mg BID and Basaglar 16 units each evening. Has had cataract surgery on both eyes the past 2 months and will have follow up eye exam in 2 days by Dr.  Nice. Foot exam today shows some decreased sensation in both heels but no ulcers or sores. Recheck labs and continue to titrate Basaglar by averaging FBS every 3 days. If average over 150, should increase by 2 units. If below 110, may decrease by 1 unit every 3 days. Follow up pending lab reports. - CBC with Differential/Platelet - Comprehensive metabolic panel  5. Pyuria Has increased self-catheterization to 4 times a day. Noticed an increase in cloudiness of urine recently. Urinalysis showed TNTC WBC's per hpf. No bladder spasms/discomfort or fever. Will send urine for C&S. Had finished Cipro for cholangitis recently. Increase fluid intake and will follow up pending report. - CULTURE, URINE COMPREHENSIVE

## 2019-02-26 DIAGNOSIS — K851 Biliary acute pancreatitis without necrosis or infection: Secondary | ICD-10-CM | POA: Diagnosis not present

## 2019-02-26 DIAGNOSIS — K8309 Other cholangitis: Secondary | ICD-10-CM | POA: Diagnosis not present

## 2019-02-26 DIAGNOSIS — D631 Anemia in chronic kidney disease: Secondary | ICD-10-CM | POA: Diagnosis not present

## 2019-02-26 DIAGNOSIS — M6281 Muscle weakness (generalized): Secondary | ICD-10-CM | POA: Diagnosis not present

## 2019-02-26 DIAGNOSIS — E1169 Type 2 diabetes mellitus with other specified complication: Secondary | ICD-10-CM | POA: Diagnosis not present

## 2019-02-26 DIAGNOSIS — D649 Anemia, unspecified: Secondary | ICD-10-CM | POA: Diagnosis not present

## 2019-02-26 DIAGNOSIS — N183 Chronic kidney disease, stage 3 (moderate): Secondary | ICD-10-CM | POA: Diagnosis not present

## 2019-02-26 DIAGNOSIS — E1122 Type 2 diabetes mellitus with diabetic chronic kidney disease: Secondary | ICD-10-CM | POA: Diagnosis not present

## 2019-02-26 DIAGNOSIS — K219 Gastro-esophageal reflux disease without esophagitis: Secondary | ICD-10-CM | POA: Diagnosis not present

## 2019-02-26 DIAGNOSIS — R69 Illness, unspecified: Secondary | ICD-10-CM | POA: Diagnosis not present

## 2019-02-26 DIAGNOSIS — K859 Acute pancreatitis without necrosis or infection, unspecified: Secondary | ICD-10-CM | POA: Diagnosis not present

## 2019-02-26 DIAGNOSIS — I129 Hypertensive chronic kidney disease with stage 1 through stage 4 chronic kidney disease, or unspecified chronic kidney disease: Secondary | ICD-10-CM | POA: Diagnosis not present

## 2019-02-27 LAB — COMPREHENSIVE METABOLIC PANEL
ALT: 26 IU/L (ref 0–32)
AST: 19 IU/L (ref 0–40)
Albumin/Globulin Ratio: 1.7 (ref 1.2–2.2)
Albumin: 4 g/dL (ref 3.8–4.8)
Alkaline Phosphatase: 130 IU/L — ABNORMAL HIGH (ref 39–117)
BUN/Creatinine Ratio: 19 (ref 12–28)
BUN: 27 mg/dL (ref 8–27)
Bilirubin Total: 0.6 mg/dL (ref 0.0–1.2)
CO2: 23 mmol/L (ref 20–29)
Calcium: 8.8 mg/dL (ref 8.7–10.3)
Chloride: 101 mmol/L (ref 96–106)
Creatinine, Ser: 1.43 mg/dL — ABNORMAL HIGH (ref 0.57–1.00)
GFR calc Af Amer: 43 mL/min/{1.73_m2} — ABNORMAL LOW (ref >59)
GFR calc non Af Amer: 37 mL/min/{1.73_m2} — ABNORMAL LOW (ref >59)
Globulin, Total: 2.3 g/dL (ref 1.5–4.5)
Glucose: 212 mg/dL — ABNORMAL HIGH (ref 65–99)
Potassium: 4.9 mmol/L (ref 3.5–5.2)
Sodium: 136 mmol/L (ref 134–144)
Total Protein: 6.3 g/dL (ref 6.0–8.5)

## 2019-02-27 LAB — CBC WITH DIFFERENTIAL/PLATELET
Basophils Absolute: 0 10*3/uL (ref 0.0–0.2)
Basos: 1 %
EOS (ABSOLUTE): 0.1 10*3/uL (ref 0.0–0.4)
Eos: 1 %
Hematocrit: 30.6 % — ABNORMAL LOW (ref 34.0–46.6)
Hemoglobin: 10.2 g/dL — ABNORMAL LOW (ref 11.1–15.9)
Immature Grans (Abs): 0 10*3/uL (ref 0.0–0.1)
Immature Granulocytes: 0 %
Lymphocytes Absolute: 1.7 10*3/uL (ref 0.7–3.1)
Lymphs: 28 %
MCH: 30.6 pg (ref 26.6–33.0)
MCHC: 33.3 g/dL (ref 31.5–35.7)
MCV: 92 fL (ref 79–97)
Monocytes Absolute: 0.5 10*3/uL (ref 0.1–0.9)
Monocytes: 8 %
Neutrophils Absolute: 3.8 10*3/uL (ref 1.4–7.0)
Neutrophils: 62 %
Platelets: 213 10*3/uL (ref 150–450)
RBC: 3.33 x10E6/uL — ABNORMAL LOW (ref 3.77–5.28)
RDW: 13.6 % (ref 11.7–15.4)
WBC: 6.1 10*3/uL (ref 3.4–10.8)

## 2019-02-27 LAB — AMYLASE: Amylase: 154 U/L — ABNORMAL HIGH (ref 31–110)

## 2019-02-28 ENCOUNTER — Telehealth: Payer: Self-pay

## 2019-02-28 ENCOUNTER — Other Ambulatory Visit: Payer: Self-pay

## 2019-02-28 DIAGNOSIS — R69 Illness, unspecified: Secondary | ICD-10-CM | POA: Diagnosis not present

## 2019-02-28 DIAGNOSIS — D631 Anemia in chronic kidney disease: Secondary | ICD-10-CM | POA: Diagnosis not present

## 2019-02-28 DIAGNOSIS — I129 Hypertensive chronic kidney disease with stage 1 through stage 4 chronic kidney disease, or unspecified chronic kidney disease: Secondary | ICD-10-CM | POA: Diagnosis not present

## 2019-02-28 DIAGNOSIS — K859 Acute pancreatitis without necrosis or infection, unspecified: Secondary | ICD-10-CM | POA: Diagnosis not present

## 2019-02-28 DIAGNOSIS — K219 Gastro-esophageal reflux disease without esophagitis: Secondary | ICD-10-CM | POA: Diagnosis not present

## 2019-02-28 DIAGNOSIS — K8309 Other cholangitis: Secondary | ICD-10-CM | POA: Diagnosis not present

## 2019-02-28 DIAGNOSIS — N183 Chronic kidney disease, stage 3 (moderate): Secondary | ICD-10-CM | POA: Diagnosis not present

## 2019-02-28 DIAGNOSIS — M6281 Muscle weakness (generalized): Secondary | ICD-10-CM | POA: Diagnosis not present

## 2019-02-28 DIAGNOSIS — N390 Urinary tract infection, site not specified: Secondary | ICD-10-CM

## 2019-02-28 DIAGNOSIS — E1122 Type 2 diabetes mellitus with diabetic chronic kidney disease: Secondary | ICD-10-CM | POA: Diagnosis not present

## 2019-02-28 LAB — CULTURE, URINE COMPREHENSIVE

## 2019-02-28 MED ORDER — DOXYCYCLINE HYCLATE 100 MG PO TABS: 100.0000 mg | ORAL_TABLET | Freq: Two times a day (BID) | ORAL | 0 refills | Status: DC

## 2019-02-28 NOTE — Telephone Encounter (Signed)
-----   Message from Margo [place], [place] sent at 02/27/2019  8:04 AM EDT ----- Blood sugar high and amylase elevated from recent pancreatitis from gallstone in bile duct. Continue to monitor FBS and adjust Basaglar Insulin as advised during office visit. Liver and kidney function tests starting to improve. Hemoglobin low and should take a multivitamin with iron once a day. Recheck levels in 6-8 weeks. Awaiting final report of urine culture.

## 2019-02-28 NOTE — Telephone Encounter (Signed)
LMTCB regarding results.  

## 2019-02-28 NOTE — Telephone Encounter (Signed)
-----   Message from [place], [place] sent at 02/28/2019  8:17 AM EDT ----- Urine culture isolated a heavy growth of a skin bacteria (staphylococcus epidermidis) and it is resistant to several bacteria except Doxycycline 100 mg BID #20. Be very careful to keep catheters sterile when using them. Recheck in 10-14 days if any UTI symptoms remain.

## 2019-03-05 DIAGNOSIS — M6281 Muscle weakness (generalized): Secondary | ICD-10-CM | POA: Diagnosis not present

## 2019-03-05 DIAGNOSIS — R69 Illness, unspecified: Secondary | ICD-10-CM | POA: Diagnosis not present

## 2019-03-05 DIAGNOSIS — K8309 Other cholangitis: Secondary | ICD-10-CM | POA: Diagnosis not present

## 2019-03-05 DIAGNOSIS — D631 Anemia in chronic kidney disease: Secondary | ICD-10-CM | POA: Diagnosis not present

## 2019-03-05 DIAGNOSIS — K219 Gastro-esophageal reflux disease without esophagitis: Secondary | ICD-10-CM | POA: Diagnosis not present

## 2019-03-05 DIAGNOSIS — N183 Chronic kidney disease, stage 3 (moderate): Secondary | ICD-10-CM | POA: Diagnosis not present

## 2019-03-05 DIAGNOSIS — K859 Acute pancreatitis without necrosis or infection, unspecified: Secondary | ICD-10-CM | POA: Diagnosis not present

## 2019-03-05 DIAGNOSIS — I129 Hypertensive chronic kidney disease with stage 1 through stage 4 chronic kidney disease, or unspecified chronic kidney disease: Secondary | ICD-10-CM | POA: Diagnosis not present

## 2019-03-05 DIAGNOSIS — E1122 Type 2 diabetes mellitus with diabetic chronic kidney disease: Secondary | ICD-10-CM | POA: Diagnosis not present

## 2019-03-06 DIAGNOSIS — N319 Neuromuscular dysfunction of bladder, unspecified: Secondary | ICD-10-CM | POA: Diagnosis not present

## 2019-03-06 DIAGNOSIS — N312 Flaccid neuropathic bladder, not elsewhere classified: Secondary | ICD-10-CM | POA: Diagnosis not present

## 2019-03-06 DIAGNOSIS — R339 Retention of urine, unspecified: Secondary | ICD-10-CM | POA: Diagnosis not present

## 2019-03-06 DIAGNOSIS — N390 Urinary tract infection, site not specified: Secondary | ICD-10-CM | POA: Diagnosis not present

## 2019-03-08 ENCOUNTER — Other Ambulatory Visit: Payer: Self-pay | Admitting: Family Medicine

## 2019-03-08 DIAGNOSIS — N183 Chronic kidney disease, stage 3 unspecified: Secondary | ICD-10-CM

## 2019-03-08 DIAGNOSIS — E1122 Type 2 diabetes mellitus with diabetic chronic kidney disease: Secondary | ICD-10-CM

## 2019-03-12 DIAGNOSIS — M6281 Muscle weakness (generalized): Secondary | ICD-10-CM | POA: Diagnosis not present

## 2019-03-12 DIAGNOSIS — K8309 Other cholangitis: Secondary | ICD-10-CM | POA: Diagnosis not present

## 2019-03-12 DIAGNOSIS — D631 Anemia in chronic kidney disease: Secondary | ICD-10-CM | POA: Diagnosis not present

## 2019-03-12 DIAGNOSIS — E1122 Type 2 diabetes mellitus with diabetic chronic kidney disease: Secondary | ICD-10-CM | POA: Diagnosis not present

## 2019-03-12 DIAGNOSIS — K859 Acute pancreatitis without necrosis or infection, unspecified: Secondary | ICD-10-CM | POA: Diagnosis not present

## 2019-03-12 DIAGNOSIS — R69 Illness, unspecified: Secondary | ICD-10-CM | POA: Diagnosis not present

## 2019-03-12 DIAGNOSIS — N183 Chronic kidney disease, stage 3 (moderate): Secondary | ICD-10-CM | POA: Diagnosis not present

## 2019-03-12 DIAGNOSIS — K219 Gastro-esophageal reflux disease without esophagitis: Secondary | ICD-10-CM | POA: Diagnosis not present

## 2019-03-12 DIAGNOSIS — I129 Hypertensive chronic kidney disease with stage 1 through stage 4 chronic kidney disease, or unspecified chronic kidney disease: Secondary | ICD-10-CM | POA: Diagnosis not present

## 2019-03-13 DIAGNOSIS — K8050 Calculus of bile duct without cholangitis or cholecystitis without obstruction: Secondary | ICD-10-CM | POA: Diagnosis not present

## 2019-03-13 DIAGNOSIS — K529 Noninfective gastroenteritis and colitis, unspecified: Secondary | ICD-10-CM | POA: Diagnosis not present

## 2019-03-13 DIAGNOSIS — K219 Gastro-esophageal reflux disease without esophagitis: Secondary | ICD-10-CM | POA: Diagnosis not present

## 2019-03-13 DIAGNOSIS — A048 Other specified bacterial intestinal infections: Secondary | ICD-10-CM | POA: Diagnosis not present

## 2019-03-13 DIAGNOSIS — E538 Deficiency of other specified B group vitamins: Secondary | ICD-10-CM | POA: Diagnosis not present

## 2019-03-13 DIAGNOSIS — Z23 Encounter for immunization: Secondary | ICD-10-CM | POA: Diagnosis not present

## 2019-03-13 DIAGNOSIS — N183 Chronic kidney disease, stage 3 unspecified: Secondary | ICD-10-CM | POA: Diagnosis not present

## 2019-03-13 DIAGNOSIS — I10 Essential (primary) hypertension: Secondary | ICD-10-CM | POA: Diagnosis not present

## 2019-03-13 DIAGNOSIS — Z86010 Personal history of colonic polyps: Secondary | ICD-10-CM | POA: Diagnosis not present

## 2019-03-13 DIAGNOSIS — G4730 Sleep apnea, unspecified: Secondary | ICD-10-CM | POA: Diagnosis not present

## 2019-03-13 DIAGNOSIS — E1169 Type 2 diabetes mellitus with other specified complication: Secondary | ICD-10-CM | POA: Diagnosis not present

## 2019-03-13 DIAGNOSIS — F09 Unspecified mental disorder due to known physiological condition: Secondary | ICD-10-CM | POA: Insufficient documentation

## 2019-03-13 DIAGNOSIS — R69 Illness, unspecified: Secondary | ICD-10-CM | POA: Diagnosis not present

## 2019-03-13 DIAGNOSIS — G250 Essential tremor: Secondary | ICD-10-CM | POA: Insufficient documentation

## 2019-03-13 DIAGNOSIS — N312 Flaccid neuropathic bladder, not elsewhere classified: Secondary | ICD-10-CM | POA: Diagnosis not present

## 2019-03-14 ENCOUNTER — Ambulatory Visit: Payer: Medicare HMO | Admitting: Family Medicine

## 2019-03-14 DIAGNOSIS — R69 Illness, unspecified: Secondary | ICD-10-CM | POA: Diagnosis not present

## 2019-03-19 DIAGNOSIS — K859 Acute pancreatitis without necrosis or infection, unspecified: Secondary | ICD-10-CM | POA: Diagnosis not present

## 2019-03-19 DIAGNOSIS — I129 Hypertensive chronic kidney disease with stage 1 through stage 4 chronic kidney disease, or unspecified chronic kidney disease: Secondary | ICD-10-CM | POA: Diagnosis not present

## 2019-03-19 DIAGNOSIS — N183 Chronic kidney disease, stage 3 unspecified: Secondary | ICD-10-CM | POA: Diagnosis not present

## 2019-03-19 DIAGNOSIS — K219 Gastro-esophageal reflux disease without esophagitis: Secondary | ICD-10-CM | POA: Diagnosis not present

## 2019-03-19 DIAGNOSIS — K8309 Other cholangitis: Secondary | ICD-10-CM | POA: Diagnosis not present

## 2019-03-19 DIAGNOSIS — E1122 Type 2 diabetes mellitus with diabetic chronic kidney disease: Secondary | ICD-10-CM | POA: Diagnosis not present

## 2019-03-19 DIAGNOSIS — R69 Illness, unspecified: Secondary | ICD-10-CM | POA: Diagnosis not present

## 2019-03-19 DIAGNOSIS — M6281 Muscle weakness (generalized): Secondary | ICD-10-CM | POA: Diagnosis not present

## 2019-03-19 DIAGNOSIS — D631 Anemia in chronic kidney disease: Secondary | ICD-10-CM | POA: Diagnosis not present

## 2019-03-21 DIAGNOSIS — Z992 Dependence on renal dialysis: Secondary | ICD-10-CM | POA: Diagnosis not present

## 2019-03-21 DIAGNOSIS — R69 Illness, unspecified: Secondary | ICD-10-CM | POA: Diagnosis not present

## 2019-03-21 DIAGNOSIS — D631 Anemia in chronic kidney disease: Secondary | ICD-10-CM | POA: Diagnosis not present

## 2019-03-22 DIAGNOSIS — R69 Illness, unspecified: Secondary | ICD-10-CM | POA: Diagnosis not present

## 2019-03-24 ENCOUNTER — Other Ambulatory Visit: Payer: Self-pay | Admitting: Family Medicine

## 2019-03-24 DIAGNOSIS — E1122 Type 2 diabetes mellitus with diabetic chronic kidney disease: Secondary | ICD-10-CM

## 2019-03-24 DIAGNOSIS — N183 Chronic kidney disease, stage 3 unspecified: Secondary | ICD-10-CM

## 2019-03-26 DIAGNOSIS — M6281 Muscle weakness (generalized): Secondary | ICD-10-CM | POA: Diagnosis not present

## 2019-03-26 DIAGNOSIS — K859 Acute pancreatitis without necrosis or infection, unspecified: Secondary | ICD-10-CM | POA: Diagnosis not present

## 2019-03-26 DIAGNOSIS — K8309 Other cholangitis: Secondary | ICD-10-CM | POA: Diagnosis not present

## 2019-03-26 DIAGNOSIS — N183 Chronic kidney disease, stage 3 unspecified: Secondary | ICD-10-CM | POA: Diagnosis not present

## 2019-03-26 DIAGNOSIS — D631 Anemia in chronic kidney disease: Secondary | ICD-10-CM | POA: Diagnosis not present

## 2019-03-26 DIAGNOSIS — K219 Gastro-esophageal reflux disease without esophagitis: Secondary | ICD-10-CM | POA: Diagnosis not present

## 2019-03-26 DIAGNOSIS — I129 Hypertensive chronic kidney disease with stage 1 through stage 4 chronic kidney disease, or unspecified chronic kidney disease: Secondary | ICD-10-CM | POA: Diagnosis not present

## 2019-03-26 DIAGNOSIS — E1122 Type 2 diabetes mellitus with diabetic chronic kidney disease: Secondary | ICD-10-CM | POA: Diagnosis not present

## 2019-03-26 DIAGNOSIS — R69 Illness, unspecified: Secondary | ICD-10-CM | POA: Diagnosis not present

## 2019-04-04 DIAGNOSIS — D631 Anemia in chronic kidney disease: Secondary | ICD-10-CM | POA: Diagnosis not present

## 2019-04-04 DIAGNOSIS — E1122 Type 2 diabetes mellitus with diabetic chronic kidney disease: Secondary | ICD-10-CM | POA: Diagnosis not present

## 2019-04-04 DIAGNOSIS — R69 Illness, unspecified: Secondary | ICD-10-CM | POA: Diagnosis not present

## 2019-04-04 DIAGNOSIS — I129 Hypertensive chronic kidney disease with stage 1 through stage 4 chronic kidney disease, or unspecified chronic kidney disease: Secondary | ICD-10-CM | POA: Diagnosis not present

## 2019-04-04 DIAGNOSIS — M6281 Muscle weakness (generalized): Secondary | ICD-10-CM | POA: Diagnosis not present

## 2019-04-04 DIAGNOSIS — K219 Gastro-esophageal reflux disease without esophagitis: Secondary | ICD-10-CM | POA: Diagnosis not present

## 2019-04-04 DIAGNOSIS — K8309 Other cholangitis: Secondary | ICD-10-CM | POA: Diagnosis not present

## 2019-04-04 DIAGNOSIS — K859 Acute pancreatitis without necrosis or infection, unspecified: Secondary | ICD-10-CM | POA: Diagnosis not present

## 2019-04-04 DIAGNOSIS — N183 Chronic kidney disease, stage 3 unspecified: Secondary | ICD-10-CM | POA: Diagnosis not present

## 2019-04-17 DIAGNOSIS — E538 Deficiency of other specified B group vitamins: Secondary | ICD-10-CM | POA: Diagnosis not present

## 2019-04-17 DIAGNOSIS — N1831 Chronic kidney disease, stage 3a: Secondary | ICD-10-CM | POA: Diagnosis not present

## 2019-04-17 DIAGNOSIS — G3184 Mild cognitive impairment, so stated: Secondary | ICD-10-CM | POA: Diagnosis not present

## 2019-04-17 DIAGNOSIS — E1169 Type 2 diabetes mellitus with other specified complication: Secondary | ICD-10-CM | POA: Diagnosis not present

## 2019-04-17 DIAGNOSIS — Z794 Long term (current) use of insulin: Secondary | ICD-10-CM | POA: Diagnosis not present

## 2019-04-22 ENCOUNTER — Other Ambulatory Visit: Payer: Self-pay | Admitting: Family Medicine

## 2019-04-22 MED ORDER — BASAGLAR KWIKPEN 100 UNIT/ML ~~LOC~~ SOPN: 24.0000 [IU] | PEN_INJECTOR | Freq: Every day | SUBCUTANEOUS | 3 refills | Status: DC

## 2019-04-27 DIAGNOSIS — R69 Illness, unspecified: Secondary | ICD-10-CM | POA: Diagnosis not present

## 2019-05-03 DIAGNOSIS — R69 Illness, unspecified: Secondary | ICD-10-CM | POA: Diagnosis not present

## 2019-05-14 ENCOUNTER — Other Ambulatory Visit: Payer: Self-pay | Admitting: Family Medicine

## 2019-05-14 DIAGNOSIS — E1122 Type 2 diabetes mellitus with diabetic chronic kidney disease: Secondary | ICD-10-CM

## 2019-05-14 DIAGNOSIS — F411 Generalized anxiety disorder: Secondary | ICD-10-CM

## 2019-05-14 DIAGNOSIS — E0842 Diabetes mellitus due to underlying condition with diabetic polyneuropathy: Secondary | ICD-10-CM

## 2019-05-14 DIAGNOSIS — N183 Chronic kidney disease, stage 3 unspecified: Secondary | ICD-10-CM

## 2019-06-01 DIAGNOSIS — R69 Illness, unspecified: Secondary | ICD-10-CM | POA: Diagnosis not present

## 2019-06-03 DIAGNOSIS — N312 Flaccid neuropathic bladder, not elsewhere classified: Secondary | ICD-10-CM | POA: Diagnosis not present

## 2019-06-03 DIAGNOSIS — N390 Urinary tract infection, site not specified: Secondary | ICD-10-CM | POA: Diagnosis not present

## 2019-06-03 DIAGNOSIS — R339 Retention of urine, unspecified: Secondary | ICD-10-CM | POA: Diagnosis not present

## 2019-06-03 DIAGNOSIS — N319 Neuromuscular dysfunction of bladder, unspecified: Secondary | ICD-10-CM | POA: Diagnosis not present

## 2019-06-13 ENCOUNTER — Other Ambulatory Visit: Payer: Self-pay | Admitting: Family Medicine

## 2019-06-13 DIAGNOSIS — E0842 Diabetes mellitus due to underlying condition with diabetic polyneuropathy: Secondary | ICD-10-CM

## 2019-06-13 DIAGNOSIS — F411 Generalized anxiety disorder: Secondary | ICD-10-CM

## 2019-07-03 DIAGNOSIS — R69 Illness, unspecified: Secondary | ICD-10-CM | POA: Diagnosis not present

## 2019-07-16 ENCOUNTER — Other Ambulatory Visit: Payer: Self-pay | Admitting: Family Medicine

## 2019-07-16 DIAGNOSIS — R69 Illness, unspecified: Secondary | ICD-10-CM | POA: Diagnosis not present

## 2019-07-16 DIAGNOSIS — F411 Generalized anxiety disorder: Secondary | ICD-10-CM

## 2019-07-16 DIAGNOSIS — E0842 Diabetes mellitus due to underlying condition with diabetic polyneuropathy: Secondary | ICD-10-CM

## 2019-07-23 DIAGNOSIS — R935 Abnormal findings on diagnostic imaging of other abdominal regions, including retroperitoneum: Secondary | ICD-10-CM | POA: Diagnosis not present

## 2019-07-23 DIAGNOSIS — A048 Other specified bacterial intestinal infections: Secondary | ICD-10-CM | POA: Diagnosis not present

## 2019-07-23 DIAGNOSIS — K529 Noninfective gastroenteritis and colitis, unspecified: Secondary | ICD-10-CM | POA: Diagnosis not present

## 2019-07-23 DIAGNOSIS — R9389 Abnormal findings on diagnostic imaging of other specified body structures: Secondary | ICD-10-CM | POA: Diagnosis not present

## 2019-07-23 DIAGNOSIS — E1169 Type 2 diabetes mellitus with other specified complication: Secondary | ICD-10-CM | POA: Diagnosis not present

## 2019-07-23 DIAGNOSIS — K5900 Constipation, unspecified: Secondary | ICD-10-CM | POA: Diagnosis not present

## 2019-07-30 DIAGNOSIS — Z803 Family history of malignant neoplasm of breast: Secondary | ICD-10-CM | POA: Diagnosis not present

## 2019-07-30 DIAGNOSIS — K219 Gastro-esophageal reflux disease without esophagitis: Secondary | ICD-10-CM | POA: Diagnosis not present

## 2019-07-30 DIAGNOSIS — I10 Essential (primary) hypertension: Secondary | ICD-10-CM | POA: Diagnosis not present

## 2019-07-30 DIAGNOSIS — R69 Illness, unspecified: Secondary | ICD-10-CM | POA: Diagnosis not present

## 2019-07-30 DIAGNOSIS — Z794 Long term (current) use of insulin: Secondary | ICD-10-CM | POA: Diagnosis not present

## 2019-07-30 DIAGNOSIS — Z6835 Body mass index (BMI) 35.0-35.9, adult: Secondary | ICD-10-CM | POA: Diagnosis not present

## 2019-07-30 DIAGNOSIS — E785 Hyperlipidemia, unspecified: Secondary | ICD-10-CM | POA: Diagnosis not present

## 2019-07-30 DIAGNOSIS — Z7982 Long term (current) use of aspirin: Secondary | ICD-10-CM | POA: Diagnosis not present

## 2019-07-30 DIAGNOSIS — E1140 Type 2 diabetes mellitus with diabetic neuropathy, unspecified: Secondary | ICD-10-CM | POA: Diagnosis not present

## 2019-08-07 DIAGNOSIS — R69 Illness, unspecified: Secondary | ICD-10-CM | POA: Diagnosis not present

## 2019-08-15 ENCOUNTER — Other Ambulatory Visit: Payer: Self-pay | Admitting: Family Medicine

## 2019-08-15 DIAGNOSIS — F411 Generalized anxiety disorder: Secondary | ICD-10-CM

## 2019-08-15 DIAGNOSIS — E0842 Diabetes mellitus due to underlying condition with diabetic polyneuropathy: Secondary | ICD-10-CM

## 2019-08-15 NOTE — Telephone Encounter (Signed)
Requested Prescriptions  Pending Prescriptions Disp Refills  . DULoxetine (CYMBALTA) 60 MG capsule [Pharmacy Med Name: DULoxetine HCl 60 MG Oral Capsule Delayed Release Particles] 30 capsule 0    Sig: Take 1 capsule by mouth once daily     Psychiatry: Antidepressants - SNRI Passed - 08/15/2019  7:44 AM      Passed - Last BP in normal range    BP Readings from Last 1 Encounters:  02/24/19 112/74         Passed - Valid encounter within last 6 months    Recent Outpatient Visits          5 months ago Gallstone pancreatitis   [place], [place] E, PA   6 months ago [place], [place], PA-C   8 months ago Diabetes mellitus due to underlying condition with diabetic polyneuropathy, without long-term current use of insulin [facility])   [place], [place], [place]   1 year ago Diabetes mellitus due to underlying condition with diabetic polyneuropathy, without long-term current use of insulin ([place])   [place], [place], [place]   1 year ago Diabetes mellitus due to underlying condition with diabetic polyneuropathy, without long-term current use of insulin [facility])   [place], [place], [place]

## 2019-08-27 DIAGNOSIS — N319 Neuromuscular dysfunction of bladder, unspecified: Secondary | ICD-10-CM | POA: Diagnosis not present

## 2019-08-27 DIAGNOSIS — R339 Retention of urine, unspecified: Secondary | ICD-10-CM | POA: Diagnosis not present

## 2019-08-27 DIAGNOSIS — N390 Urinary tract infection, site not specified: Secondary | ICD-10-CM | POA: Diagnosis not present

## 2019-08-27 DIAGNOSIS — N312 Flaccid neuropathic bladder, not elsewhere classified: Secondary | ICD-10-CM | POA: Diagnosis not present

## 2019-08-29 ENCOUNTER — Other Ambulatory Visit: Payer: Self-pay | Admitting: Family Medicine

## 2019-08-29 MED ORDER — BASAGLAR KWIKPEN 100 UNIT/ML ~~LOC~~ SOPN: 24.0000 [IU] | PEN_INJECTOR | Freq: Every day | SUBCUTANEOUS | 0 refills | Status: DC

## 2019-08-29 NOTE — Telephone Encounter (Signed)
Attempted to call patient for appointment- left message to call back. Refilled x1 per protocol.

## 2019-08-29 NOTE — Telephone Encounter (Signed)
Medication Refill - Medication: Insulin Glargine (BASAGLAR KWIKPEN) 100 UNIT/ML SOPN [[id number]]     Preferred Pharmacy (with phone number or street name):  [facility] [address], [place] - [place]  [place] [address]  Phone: [phone] Fax: [phone]     Agent: Please be advised that RX refills may take up to 3 business days. We ask that you follow-up with your pharmacy.

## 2019-09-09 DIAGNOSIS — R69 Illness, unspecified: Secondary | ICD-10-CM | POA: Diagnosis not present

## 2019-09-12 ENCOUNTER — Other Ambulatory Visit: Payer: Self-pay | Admitting: Family Medicine

## 2019-09-12 DIAGNOSIS — E0842 Diabetes mellitus due to underlying condition with diabetic polyneuropathy: Secondary | ICD-10-CM

## 2019-09-12 DIAGNOSIS — F411 Generalized anxiety disorder: Secondary | ICD-10-CM

## 2019-09-16 NOTE — Progress Notes (Addendum)
Subjective:   Cynthia Sims is a 69 y.o. female who presents for Medicare Annual (Subsequent) preventive examination.    This visit is being conducted through telemedicine due to the COVID-19 pandemic. This patient has given me verbal consent via doximity to conduct this visit, patient states they are participating from their home address. Some vital signs may be absent or patient reported.    Patient identification: identified by name, DOB, and current address  Review of Systems:  N/A  Cardiac Risk Factors include: advanced age (>25men, >57 women);diabetes mellitus;dyslipidemia;hypertension     Objective:     Vitals: There were no vitals taken for this visit.  There is no height or weight on file to calculate BMI. Unable to obtain vitals due to visit being conducted via telephonically.   Advanced Directives 09/17/2019 08/22/2018 11/07/2017 07/20/2017 07/20/2016 01/06/2016 12/27/2015  Does Patient Have a Medical Advance Directive? Yes No No No No No No  Type of Paramedic of [place];Living will - - - - - -  Copy of [place] in Chart? No - copy requested - - - - - -  Would patient like information on creating a medical advance directive? - No - Patient declined - No - Patient declined - No - patient declined information Yes Higher education careers adviser given    Tobacco Social History   Tobacco Use  Smoking Status Never Smoker  Smokeless Tobacco Never Used     Counseling given: Not Answered   Clinical Intake:  Pre-visit preparation completed: Yes  Pain : No/denies pain Pain Score: 0-No pain     Nutritional Risks: None Diabetes: Yes  How often do you need to have someone help you when you read instructions, pamphlets, or other written materials from your doctor or pharmacy?: 1 - Never   Diabetes:  Is the patient diabetic?  Yes  If diabetic, was a CBG obtained today?  No  Did the patient bring in their glucometer from home?  No    How often do you monitor your CBG's? Three times a day.   Financial Strains and Diabetes Management:  Are you having any financial strains with the device, your supplies or your medication? No .  Does the patient want to be seen by Chronic Care Management for management of their diabetes?  No  Would the patient like to be referred to a Nutritionist or for Diabetic Management?  No   Diabetic Exams:  Diabetic Eye Exam: Completed 12/09/18. Repeat yearly.  Diabetic Foot Exam: Completed 12/21/17. Pt has been advised about the importance in completing this exam. Note made to follow up on this at next in office visit.    Interpreter Needed?: No  Information entered by :: [facility], LPN  Past Medical History:  Diagnosis Date   ALT (SGPT) level raised    Anemia    Anxiety    Arthritis    Depression    Diabetes mellitus without complication (HCC)    Elevated cholesterol    GERD (gastroesophageal reflux disease)    Hemorrhoids    Hypertension    Kidney disease    Past Surgical History:  Procedure Laterality Date   abdominal blockage     CESAREAN SECTION  1983   CESAREAN SECTION     CHOLECYSTECTOMY     COLONOSCOPY WITH PROPOFOL N/A 10/29/2015   Procedure: COLONOSCOPY WITH PROPOFOL;  Surgeon: Manya Silvas, MD;  Location: [facility];  Service: Endoscopy;  Laterality: N/A;   COLONOSCOPY WITH  PROPOFOL N/A 11/07/2017   Procedure: COLONOSCOPY WITH PROPOFOL;  Surgeon: Toledo, Benay Pike, MD;  Location: [facility] ENDOSCOPY;  Service: Gastroenterology;  Laterality: N/A;   DILATION AND CURETTAGE OF UTERUS  2001   ESOPHAGOGASTRODUODENOSCOPY (EGD) WITH PROPOFOL N/A 10/29/2015   Procedure: ESOPHAGOGASTRODUODENOSCOPY (EGD) WITH PROPOFOL;  Surgeon: Manya Silvas, MD;  Location[facility] ENDOSCOPY;  Service: Endoscopy;  Laterality: N/A;   Family History  Problem Relation Age of Onset   Alzheimer's disease Mother    COPD Mother    Breast cancer Mother 22   Osteoporosis Mother    Heart attack  Father    Lupus Father    Diabetes Father    Hypertension Father    Diabetes Sister    COPD Maternal Grandmother    Diabetes Paternal Grandfather    Diabetes Sister    Diabetes Sister    Social History   Socioeconomic History   Marital status: Married    Spouse name: Not on file   Number of children: 5   Years of education: Not on file   Highest education level: 8th grade  Occupational History   Occupation: retired  Tobacco Use   Smoking status: Never Smoker   Smokeless tobacco: Never Used  Substance and Sexual Activity   Alcohol use: No    Alcohol/week: 0.0 standard drinks   Drug use: No   Sexual activity: Yes    Birth control/protection: Post-menopausal  Other Topics Concern   Not on file  Social History Narrative   Applied for food stamps. Pt is living off social security money and is raising one of her grandchildren.    Social Determinants of Health   Financial Resource Strain: Low Risk    Difficulty of Paying Living Expenses: Not hard at all  Food Insecurity: No Food Insecurity   Worried About Charity fundraiser in the Last Year: Never true   [place] in the Last Year: Never true  Transportation Needs: No Transportation Needs   Lack of Transportation (Medical): No   Lack of Transportation (Non-Medical): No  Physical Activity: Inactive   Days of Exercise per Week: 0 days   Minutes of Exercise per Session: 0 min  Stress: No Stress Concern Present   Feeling of Stress : Not at all  Social Connections: Somewhat Isolated   Frequency of Communication with Friends and Family: More than three times a week   Frequency of Social Gatherings with Friends and Family: Once a week   Attends Religious Services: Never   Marine scientist or Organizations: No   Attends Archivist Meetings: Never   Marital Status: Married    Outpatient Encounter Medications as of 09/17/2019  Medication Sig   acetaminophen (TYLENOL) 500 MG tablet Take 500 mg by mouth  every 6 (six) hours as needed.    aspirin EC 81 MG tablet Take 81 mg by mouth daily.    carvedilol (COREG) 12.5 MG tablet Take 12.5 mg by mouth 2 (two) times daily with a meal.   Cranberry 125 MG TABS Take 1 tablet by mouth daily.   DULoxetine (CYMBALTA) 60 MG capsule Take 1 capsule by mouth once daily   ferrous sulfate 325 (65 FE) MG tablet Take 1 tablet by mouth daily.   glipiZIDE (GLUCOTROL) 10 MG tablet Take 1 tablet by mouth twice daily   glucose blood (ONETOUCH ULTRA) test strip 1 each by Other route 3 (three) times daily. Use as instructed   hydrOXYzine (ATARAX/VISTARIL) 10 MG tablet Take  1 tablet (10 mg total) by mouth 3 (three) times daily as needed.   Insulin Glargine (BASAGLAR KWIKPEN) 100 UNIT/ML Inject 0.24 mLs (24 Units total) into the skin daily. (Patient taking differently: Inject 24 Units into the skin at bedtime. )   lovastatin (MEVACOR) 40 MG tablet Take 1 tablet by mouth once daily   metFORMIN (GLUCOPHAGE) 1000 MG tablet Take 500 mg by mouth 2 (two) times daily with a meal.    omeprazole (PRILOSEC) 40 MG capsule Take 40 mg by mouth daily.    OneTouch Delica Lancets 83M MISC USE ONE  TO CHECK GLUCOSE ONCE DAILY   vitamin B-12 (CYANOCOBALAMIN) 1000 MCG tablet Take 1,000 mcg by mouth daily.   BESIVANCE 0.6 % SUSP INSTILL 1 DROP INTO RIGHT EYE THREE TIMES DAILY   cyanocobalamin (,VITAMIN B-12,) 1000 MCG/ML injection Inject into the muscle daily.   doxycycline (VIBRA-TABS) 100 MG tablet Take 1 tablet (100 mg total) by mouth 2 (two) times daily. (Patient not taking: Reported on 09/17/2019)   DUREZOL 0.05 % EMUL INSTILL 1 DROP INTO RIGHT EYE THREE TIMES DAILY AS DIRECTED   magnesium oxide (MAG-OX) 400 MG tablet Take 400 mg by mouth daily.   PROLENSA 0.07 % SOLN    No facility-administered encounter medications on file as of 09/17/2019.    Activities of Daily Living In your present state of health, do you have any difficulty performing the following activities: 09/17/2019  Hearing? N   Vision? N  Difficulty concentrating or making decisions? N  Walking or climbing stairs? N  Dressing or bathing? N  Doing errands, shopping? N  Preparing Food and eating ? N  Using the Toilet? N  In the past six months, have you accidently leaked urine? N  Do you have problems with loss of bowel control? N  Managing your Medications? N  Managing your Finances? N  Housekeeping or managing your Housekeeping? N  Some recent data might be hidden    Patient Care Team: Chrismon, Vickki Muff, PA as PCP - General (Physician Assistant) Sharlotte Alamo, DPM as Consulting Physician (Podiatry) Bjorn Loser, MD as Consulting Physician (Urology) Delray Alt, MD as Referring Physician (Internal Medicine)    Assessment:   This is a routine wellness examination for [place].  Exercise Activities and Dietary recommendations Current Exercise Habits: The patient does not participate in regular exercise at present, Exercise limited by: None identified  Goals      DIET - REDUCE SUGAR INTAKE     Recommend monitoring amount of sugar consumed and avoiding sweets as much as possible.      Exercise 3x per week (30 min per time)     Recommend to exercise for 3 days a week for at least 30 minutes at a time.      LIFESTYLE - DECREASE FALLS RISK     Recommend to remove any items from the home that may cause slips or trips.        Fall Risk: Fall Risk  09/17/2019 08/22/2018 03/28/2018 12/21/2017 07/20/2017  Falls in the past year? 1 0 No Yes No  Number falls in past yr: 0 0 - 2 or more -  Injury with Fall? 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 -  Risk for fall due to : - - - History of fall(s);Impaired balance/gait -  Follow up Falls prevention discussed - - Falls  evaluation completed;Falls prevention discussed;Education provided -    FALL RISK PREVENTION PERTAINING TO THE HOME:  Any stairs in or around the  home? Yes  If so, are there any without handrails? No   Home free of loose throw rugs in walkways, pet beds, electrical cords, etc? Yes  Adequate lighting in your home to reduce risk of falls? Yes   ASSISTIVE DEVICES UTILIZED TO PREVENT FALLS:  Life alert? No  Use of a cane, walker or w/c? Yes  Grab bars in the bathroom? No  Shower chair or bench in shower? Yes  Elevated toilet seat or a handicapped toilet? Yes   TIMED UP AND GO:  Was the test performed? No .    Depression Screen PHQ 2/9 Scores 09/17/2019 08/22/2018 03/28/2018 12/21/2017  PHQ - 2 Score 0 6 6 2   PHQ- 9 Score - 15 16 10      Cognitive Function: Declined today.  MMSE - Mini Mental State Exam 12/24/2017  Orientation to time 5  Orientation to Place 5  Registration 3  Attention/ Calculation 5  Recall 3  Language- name 2 objects 2  Language- repeat 1  Language- follow 3 step command 3  Language- read & follow direction 1  Write a sentence 1  Copy design 1  Total score 30     6CIT Screen 08/22/2018  What Year? 0 points  What month? 0 points  What time? 0 points  Count back from 20 0 points  Months in reverse 0 points  Repeat phrase 0 points  Total Score 0    Immunization History  Administered Date(s) Administered   Influenza, High Dose Seasonal PF 02/26/2018   Influenza,inj,Quad PF,6+ Mos 03/13/2019   Influenza-Unspecified 03/16/2014, 03/24/2016, 03/12/2017   PFIZER SARS-COV-2 Vaccination 09/15/2019   Pneumococcal Conjugate-13 09/25/2014   Pneumococcal Polysaccharide-23 11/16/2015, 02/15/2019   Tdap 09/25/2014    Qualifies for Shingles Vaccine? Yes . Due for Shingrix. Pt has been advised to call insurance company to determine out of pocket expense. Advised may also receive vaccine at local pharmacy or Health Dept. Verbalized acceptance and understanding.  Tdap: Up to date  Flu Vaccine: Up to date  Pneumococcal Vaccine: Completed series  Screening Tests Health Maintenance  Topic Date Due    FOOT EXAM  12/22/2018   HEMOGLOBIN A1C  06/04/2019   URINE MICROALBUMIN  08/09/2019   OPHTHALMOLOGY EXAM  12/09/2019   INFLUENZA VACCINE  01/11/2020   MAMMOGRAM  01/06/2021   COLONOSCOPY  11/08/2022   TETANUS/TDAP  09/24/2024   DEXA SCAN  Completed   Hepatitis C Screening  Completed   PNA vac Low Risk Adult  Completed    Cancer Screenings:  Colorectal Screening: Completed 11/07/17. Repeat every 5 years.   Mammogram: Completed 01/07/19. Repeat every 1-2 years as advised.   Bone Density: Completed 09/01/15. Previous DEXA scan was normal. No repeat needed unless advised by a physician.  Lung Cancer Screening: (Low Dose CT Chest recommended if Age 21-80 years, 30 pack-year currently smoking OR have quit w/in 15years.) does not qualify.   Additional Screening:  Hepatitis C Screening: Up to date  Dental Screening: Recommended annual dental exams for proper oral hygiene   Community Resource Referral:  CRR required this visit?  No       Plan:  I have personally reviewed and addressed the Medicare Annual Wellness questionnaire and have noted the following in the patient's chart:  A. Medical and social history B. Use of alcohol, tobacco or illicit drugs  C. Current medications  and supplements D. Functional ability and status E.  Nutritional status F.  Physical activity G. Advance directives H. List of other physicians I.  Hospitalizations, surgeries, and ER visits in previous 12 months J.  [place] such as hearing and vision if needed, cognitive and depression L. Referrals and appointments   In addition, I have reviewed and discussed with patient certain preventive protocols, quality metrics, and best practice recommendations. A written personalized care plan for preventive services as well as general preventive health recommendations were provided to patient.   [place] Score, [place]  2/0/3559 Nurse Health Advisor   Nurse Notes: Pt needs a  diabetic foot exam, Hgb A1c check and a urine check at next in office apt.  Reviewed screening note from [place]. Agree with documentation and recommendations.

## 2019-09-17 ENCOUNTER — Other Ambulatory Visit: Payer: Self-pay

## 2019-09-17 ENCOUNTER — Ambulatory Visit (INDEPENDENT_AMBULATORY_CARE_PROVIDER_SITE_OTHER): Payer: Medicare HMO

## 2019-09-17 DIAGNOSIS — Z0000 Encounter for general adult medical examination without abnormal findings: Secondary | ICD-10-CM | POA: Diagnosis not present

## 2019-09-17 DIAGNOSIS — E0842 Diabetes mellitus due to underlying condition with diabetic polyneuropathy: Secondary | ICD-10-CM

## 2019-09-17 DIAGNOSIS — F411 Generalized anxiety disorder: Secondary | ICD-10-CM

## 2019-09-17 NOTE — Telephone Encounter (Signed)
Spoke with pt today to complete her AWV and she requested a refill on her Cymbalta 60 mg. Pt would like this refill sent to [facility] in [place]. Please advise, thank you.

## 2019-09-17 NOTE — Patient Instructions (Signed)
Cynthia Sims , Thank you for taking time to come for your Medicare Wellness Visit. I appreciate your ongoing commitment to your health goals. Please review the following plan we discussed and let me know if I can assist you in the future.   Screening recommendations/referrals: Colonoscopy: Up to date, due 10/2022 Mammogram: Up to date, due 12/2020 Bone Density: Up to date. Previous DEXA scan was normal. No repeat needed unless advised by a physician. Recommended yearly ophthalmology/optometry visit for glaucoma screening and checkup Recommended yearly dental visit for hygiene and checkup  Vaccinations: Influenza vaccine: Up to date Pneumococcal vaccine: Completed series Tdap vaccine: Up to date Shingles vaccine: Pt declines today.     Advanced directives: Please bring a copy of your POA (Power of Attorney) and/or Living Will to your next appointment.   Conditions/risks identified: Fall risk prevention discussed today. Recommend to start exercising 3 days weekly for at least 30 minutes at a time.  Next appointment: 10/06/19 @ 10:00 AM with Simona Huh Chrismon. Declined scheduling an AWV for 2022 at this time.   Preventive Care 52 Years and Older, Female Preventive care refers to lifestyle choices and visits with your health care provider that can promote health and wellness. What does preventive care include?  A yearly physical exam. This is also called an annual well check.  Dental exams once or twice a year.  Routine eye exams. Ask your health care provider how often you should have your eyes checked.  Personal lifestyle choices, including:  Daily care of your teeth and gums.  Regular physical activity.  Eating a healthy diet.  Avoiding tobacco and drug use.  Limiting alcohol use.  Practicing safe sex.  Taking low-dose aspirin every day.  Taking vitamin and mineral supplements as recommended by your health care provider. What happens during an annual well check? The  services and screenings done by your health care provider during your annual well check will depend on your age, overall health, lifestyle risk factors, and family history of disease. Counseling  Your health care provider may ask you questions about your:  Alcohol use.  Tobacco use.  Drug use.  Emotional well-being.  Home and relationship well-being.  Sexual activity.  Eating habits.  History of falls.  Memory and ability to understand (cognition).  Work and work Statistician.  Reproductive health. Screening  You may have the following tests or measurements:  Height, weight, and BMI.  Blood pressure.  Lipid and cholesterol levels. These may be checked every 5 years, or more frequently if you are over 24 years old.  Skin check.  Lung cancer screening. You may have this screening every year starting at age 28 if you have a 30-pack-year history of smoking and currently smoke or have quit within the past 15 years.  Fecal occult blood test (FOBT) of the stool. You may have this test every year starting at age 47.  Flexible sigmoidoscopy or colonoscopy. You may have a sigmoidoscopy every 5 years or a colonoscopy every 10 years starting at age 10.  Hepatitis C blood test.  Hepatitis B blood test.  Sexually transmitted disease (STD) testing.  Diabetes screening. This is done by checking your blood sugar (glucose) after you have not eaten for a while (fasting). You may have this done every 1-3 years.  Bone density scan. This is done to screen for osteoporosis. You may have this done starting at age 50.  Mammogram. This may be done every 1-2 years. Talk to your health care provider about  how often you should have regular mammograms. Talk with your health care provider about your test results, treatment options, and if necessary, the need for more tests. Vaccines  Your health care provider may recommend certain vaccines, such as:  Influenza vaccine. This is recommended  every year.  Tetanus, diphtheria, and acellular pertussis (Tdap, Td) vaccine. You may need a Td booster every 10 years.  Zoster vaccine. You may need this after age 56.  Pneumococcal 13-valent conjugate (PCV13) vaccine. One dose is recommended after age 74.  Pneumococcal polysaccharide (PPSV23) vaccine. One dose is recommended after age 42. Talk to your health care provider about which screenings and vaccines you need and how often you need them. This information is not intended to replace advice given to you by your health care provider. Make sure you discuss any questions you have with your health care provider. Document Released: 06/25/2015 Document Revised: 02/16/2016 Document Reviewed: 03/30/2015 Elsevier Interactive Patient Education  2017 [place] Prevention in the Home Falls can cause injuries. They can happen to people of all ages. There are many things you can do to make your home safe and to help prevent falls. What can I do on the outside of my home?  Regularly fix the edges of walkways and driveways and fix any cracks.  Remove anything that might make you trip as you walk through a door, such as a raised step or threshold.  Trim any bushes or trees on the path to your home.  Use bright outdoor lighting.  Clear any walking paths of anything that might make someone trip, such as rocks or tools.  Regularly check to see if handrails are loose or broken. Make sure that both sides of any steps have handrails.  Any raised decks and porches should have guardrails on the edges.  Have any leaves, snow, or ice cleared regularly.  Use sand or salt on walking paths during winter.  Clean up any spills in your garage right away. This includes oil or grease spills. What can I do in the bathroom?  Use night lights.  Install grab bars by the toilet and in the tub and shower. Do not use towel bars as grab bars.  Use non-skid mats or decals in the tub or shower.  If  you need to sit down in the shower, use a plastic, non-slip stool.  Keep the floor dry. Clean up any water that spills on the floor as soon as it happens.  Remove soap buildup in the tub or shower regularly.  Attach bath mats securely with double-sided non-slip rug tape.  Do not have throw rugs and other things on the floor that can make you trip. What can I do in the bedroom?  Use night lights.  Make sure that you have a light by your bed that is easy to reach.  Do not use any sheets or blankets that are too big for your bed. They should not hang down onto the floor.  Have a firm chair that has side arms. You can use this for support while you get dressed.  Do not have throw rugs and other things on the floor that can make you trip. What can I do in the kitchen?  Clean up any spills right away.  Avoid walking on wet floors.  Keep items that you use a lot in easy-to-reach places.  If you need to reach something above you, use a strong step stool that has a grab bar.  Keep  electrical cords out of the way.  Do not use floor polish or wax that makes floors slippery. If you must use wax, use non-skid floor wax.  Do not have throw rugs and other things on the floor that can make you trip. What can I do with my stairs?  Do not leave any items on the stairs.  Make sure that there are handrails on both sides of the stairs and use them. Fix handrails that are broken or loose. Make sure that handrails are as long as the stairways.  Check any carpeting to make sure that it is firmly attached to the stairs. Fix any carpet that is loose or worn.  Avoid having throw rugs at the top or bottom of the stairs. If you do have throw rugs, attach them to the floor with carpet tape.  Make sure that you have a light switch at the top of the stairs and the bottom of the stairs. If you do not have them, ask someone to add them for you. What else can I do to help prevent falls?  Wear shoes  that:  Do not have high heels.  Have rubber bottoms.  Are comfortable and fit you well.  Are closed at the toe. Do not wear sandals.  If you use a stepladder:  Make sure that it is fully opened. Do not climb a closed stepladder.  Make sure that both sides of the stepladder are locked into place.  Ask someone to hold it for you, if possible.  Clearly mark and make sure that you can see:  Any grab bars or handrails.  First and last steps.  Where the edge of each step is.  Use tools that help you move around (mobility aids) if they are needed. These include:  Canes.  Walkers.  Scooters.  Crutches.  Turn on the lights when you go into a dark area. Replace any light bulbs as soon as they burn out.  Set up your furniture so you have a clear path. Avoid moving your furniture around.  If any of your floors are uneven, fix them.  If there are any pets around you, be aware of where they are.  Review your medicines with your doctor. Some medicines can make you feel dizzy. This can increase your chance of falling. Ask your doctor what other things that you can do to help prevent falls. This information is not intended to replace advice given to you by your health care provider. Make sure you discuss any questions you have with your health care provider. Document Released: 03/25/2009 Document Revised: 11/04/2015 Document Reviewed: 07/03/2014 Elsevier Interactive Patient Education  2017 [employer].

## 2019-09-18 MED ORDER — DULOXETINE HCL 60 MG PO CPEP: 60.0000 mg | ORAL_CAPSULE | Freq: Every day | ORAL | 5 refills | Status: DC

## 2019-09-20 IMAGING — CT CT ABD-PELV W/O CM
2 of 4 series · 17 of 46 positions shown, 19 images · non-contrast
Comparison: CT the abdomen and pelvis 02/04/2014.

CLINICAL DATA: 67-year-old female with worsening diarrhea since
June 2017. Nausea and vomiting.

EXAM:
CT ABDOMEN AND PELVIS WITHOUT CONTRAST
TECHNIQUE: Multidetector CT imaging of the abdomen and pelvis was performed
following the standard protocol without IV contrast.

[Series 2: axial st · axial · 0.80mm/px · z∈[-475,-75]mm · 14 of 88 slices shown, 16 images]
[im 4/88  soft-tissue]
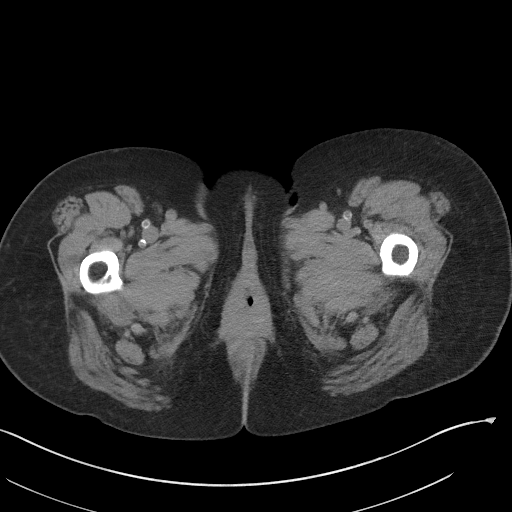
[im 4/88  bone]
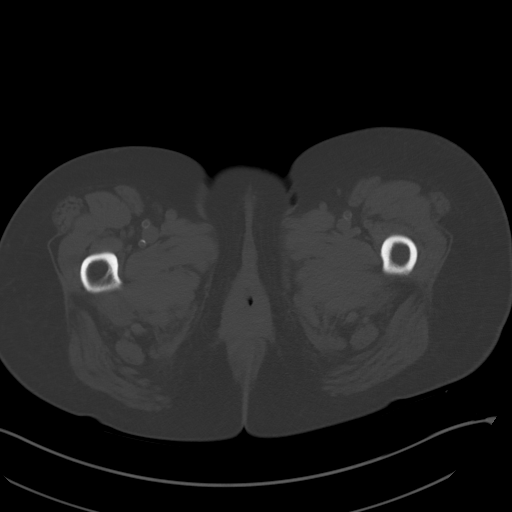
[im 11/88  soft-tissue]
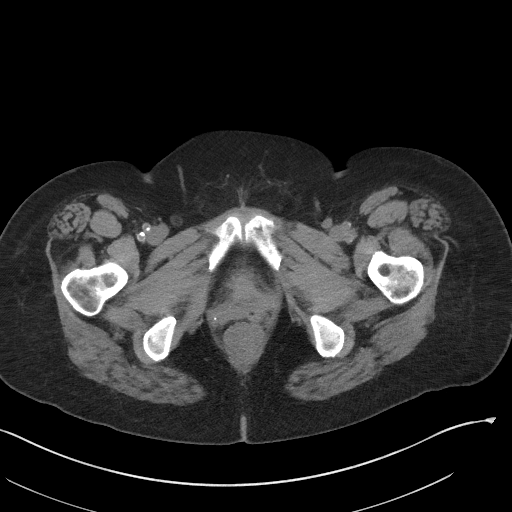
[im 18/88  soft-tissue]
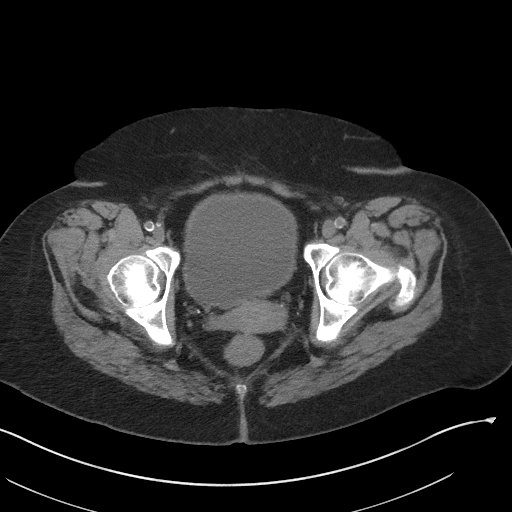
[im 25/88  soft-tissue]
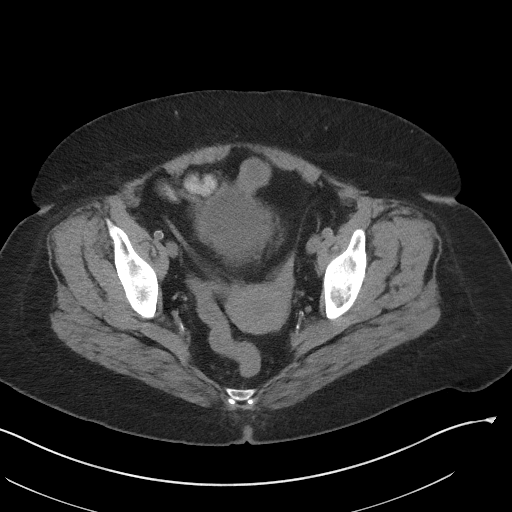
[im 28/88  soft-tissue]
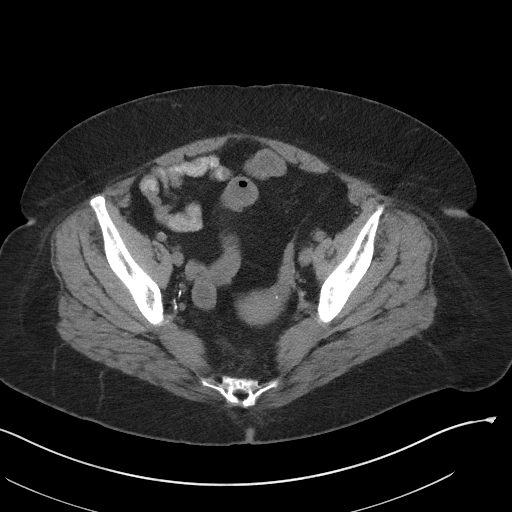
[im 35/88  soft-tissue]
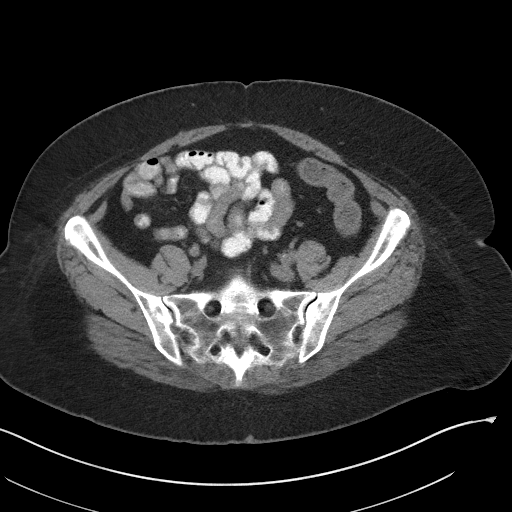
[im 42/88  soft-tissue]
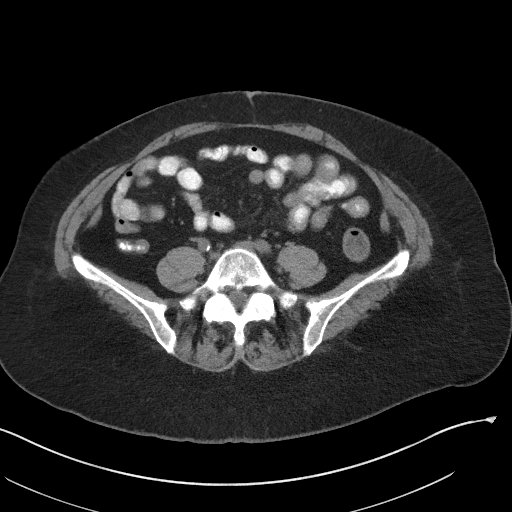
[im 46/88  soft-tissue]
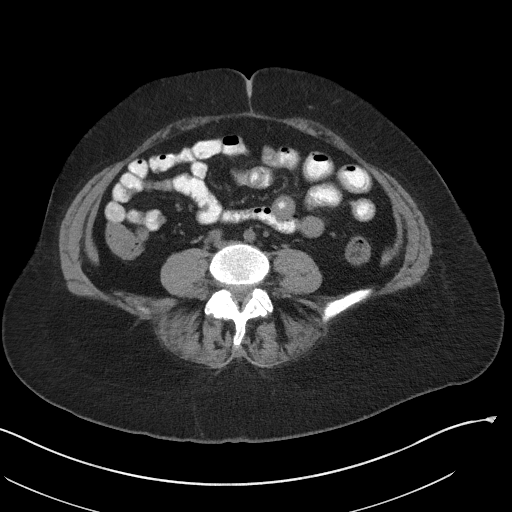
[im 53/88  soft-tissue]
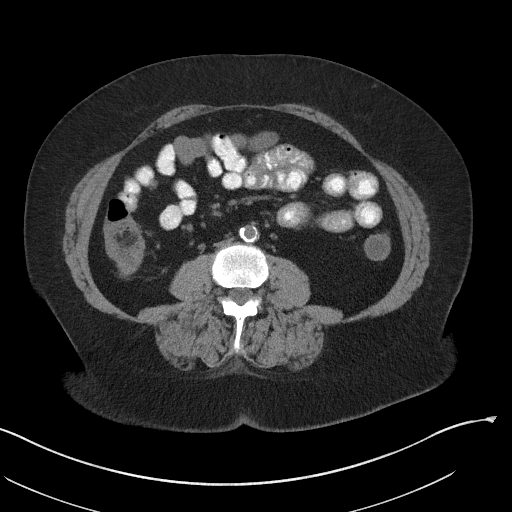
[im 53/88  bone]
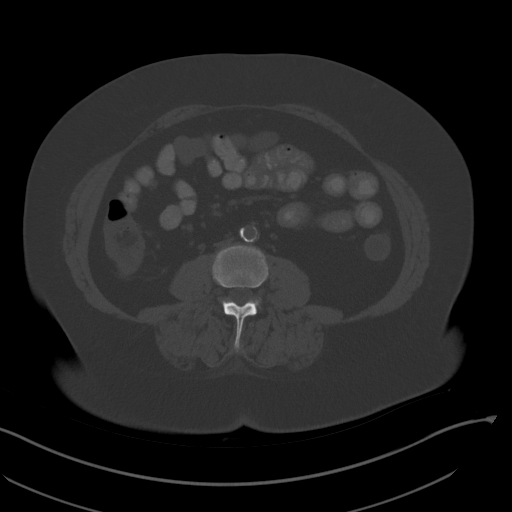
[im 60/88  soft-tissue]
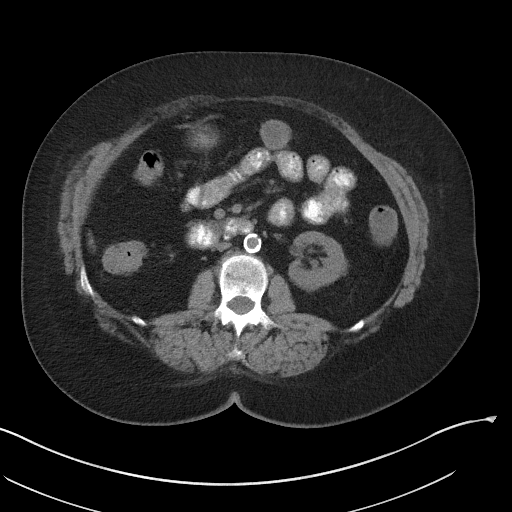
[im 67/88  soft-tissue]
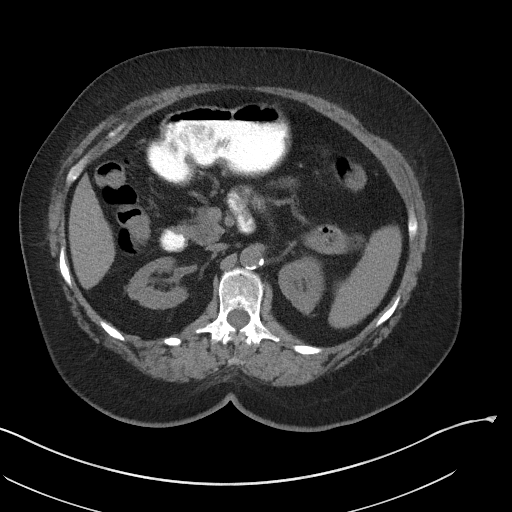
[im 70/88  soft-tissue]
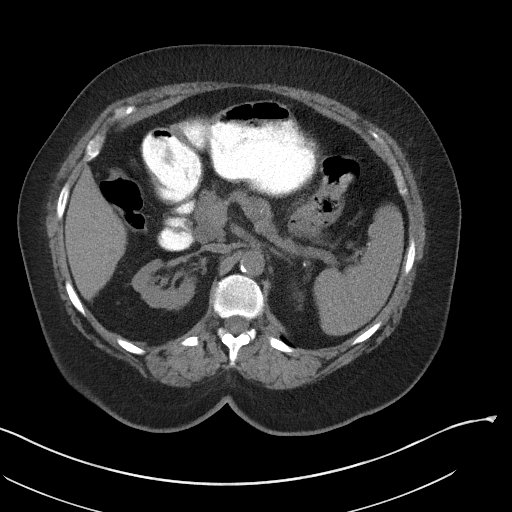
[im 77/88  soft-tissue]
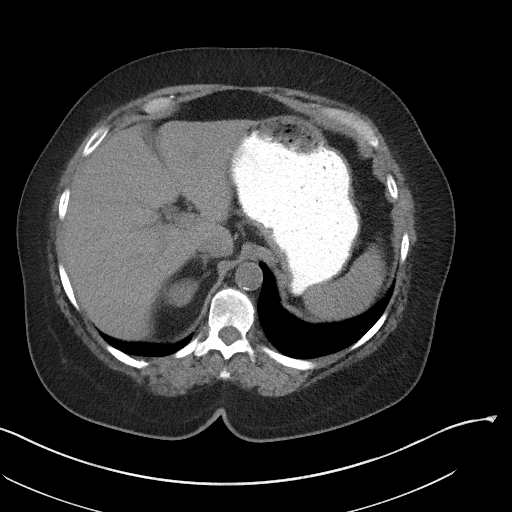
[im 84/88  soft-tissue]
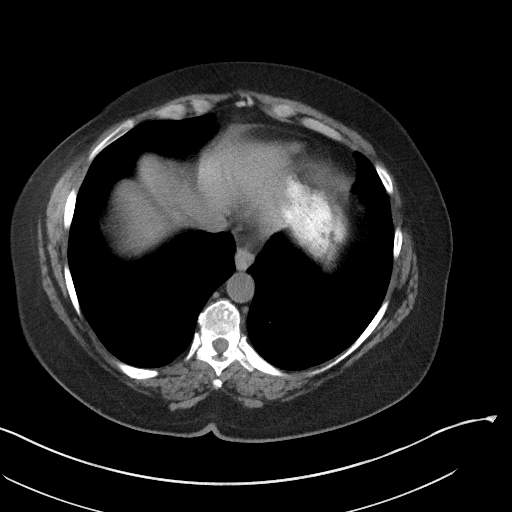

[Series 5: coronal st · coronal · 0.87mm/px · 3 of 99 slices shown]
[im 33/99  soft-tissue]
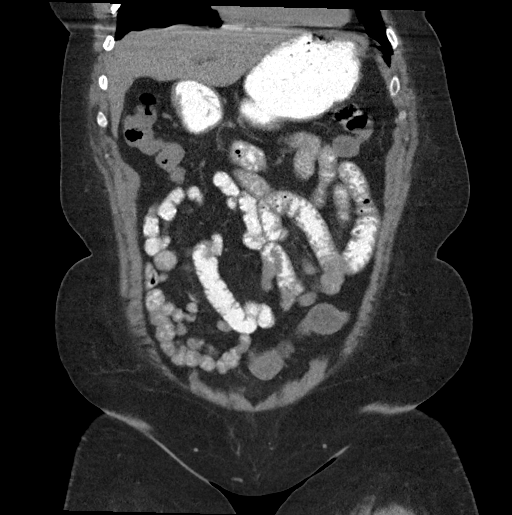
[im 44/99  soft-tissue]
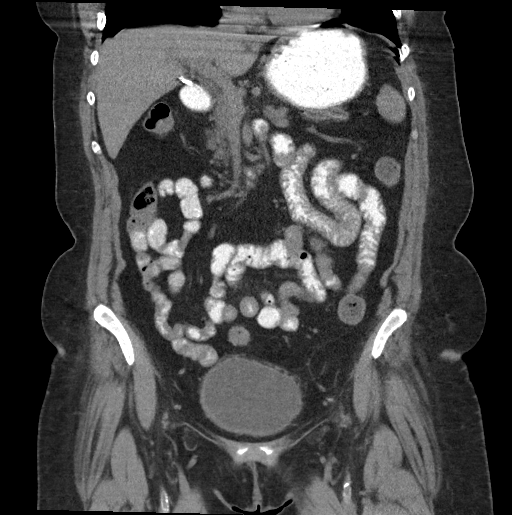
[im 55/99  soft-tissue]
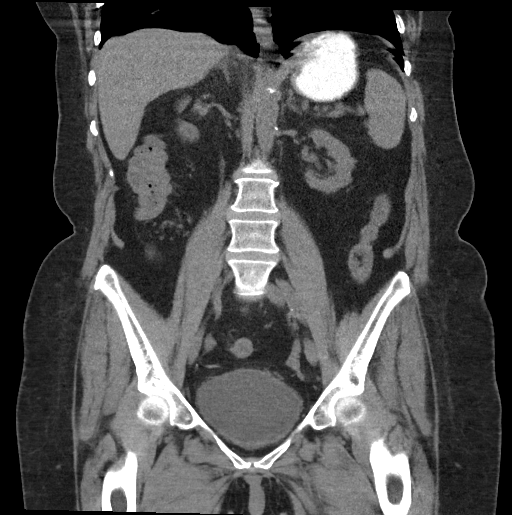

[17 of 46 positions shown; findings below may reference images not displayed]

FINDINGS: Lower chest: Unremarkable.

Hepatobiliary: No definite suspicious appearing cystic or solid
hepatic lesions are confidently identified on today's noncontrast CT
examination. Status post cholecystectomy.

Pancreas: No definite pancreatic mass or peripancreatic fluid or
inflammatory changes are noted on today's noncontrast CT
examination.

Spleen: Unremarkable.

Adrenals/Urinary Tract: Unenhanced appearance of the kidneys and
bilateral adrenal glands is normal. No urinary tract calculi. No
hydroureteronephrosis. Urinary bladder is normal in appearance.

Stomach/Bowel: Unenhanced appearance of the stomach is normal. Small
duodenal diverticulum extending off the medial aspect of the second
portion of the duodenum. No surrounding inflammatory changes. Normal
appendix.

Vascular/Lymphatic: Aortic atherosclerosis. No lymphadenopathy noted
in the abdomen or pelvis.

Reproductive: Uterus and ovaries are unremarkable in appearance.

Other: No significant volume of ascites.  No pneumoperitoneum.

Musculoskeletal: There are no aggressive appearing lytic or blastic
lesions noted in the visualized portions of the skeleton.
IMPRESSION: 1. No acute findings are noted in the abdomen or pelvis to account
for the patient's symptoms.
2. Aortic atherosclerosis.
3. Additional incidental findings, as above.

Aortic Atherosclerosis ([id number]-[id number]).

## 2019-09-25 DIAGNOSIS — R69 Illness, unspecified: Secondary | ICD-10-CM | POA: Diagnosis not present

## 2019-09-29 ENCOUNTER — Other Ambulatory Visit: Payer: Self-pay | Admitting: Family Medicine

## 2019-09-29 DIAGNOSIS — N183 Chronic kidney disease, stage 3 unspecified: Secondary | ICD-10-CM

## 2019-09-29 DIAGNOSIS — E1122 Type 2 diabetes mellitus with diabetic chronic kidney disease: Secondary | ICD-10-CM

## 2019-09-29 NOTE — Telephone Encounter (Signed)
Courtesy refill of glipizide. Pt has appt 10/06/2019

## 2019-09-30 NOTE — Progress Notes (Addendum)
Established patient visit   I,Cynthia Sims,acting as a scribe for [employer], [employer].,have documented all relevant documentation on the behalf of Cynthia Murders, PA,as directed by  [employer], [employer] while in the presence of [employer], [place].   [name]   DOB: 03/14/1951   69 y.o. Female  MRN: [id number] Visit Date: 10/06/2019  Today's healthcare provider: [name], PA   Chief Complaint  Patient presents with  . Diabetes   Subjective    HPI  Diabetes Mellitus Type II, Follow-up  Lab Results  Component Value Date   HGBA1C 6.7 (H) 12/03/2018   HGBA1C 6.9 (A) 08/08/2018   HGBA1C 6.6 (H) 03/28/2018   Last seen for diabetes 6 months ago.  Management since then includes none. She reports good compliance with treatment. She is not having side effects.    Home blood sugar records: fasting range: 100-250  Episodes of hypoglycemia? Yes, very rare, usually due to being busy and not eating correctly   Current insulin regiment: Basaglar 24 iu q pm Most Recent Eye Exam: Has appointment in 2 months   Pertinent Labs: Lab Results  Component Value Date   CHOL 158 12/03/2018   HDL 60 12/03/2018   LDLCALC 71 12/03/2018   TRIG 134 12/03/2018   CHOLHDL 2.6 12/03/2018   Lab Results  Component Value Date   NA 136 02/26/2019   K 4.9 02/26/2019   CO2 23 02/26/2019   GLUCOSE 212 (H) 02/26/2019   BUN 27 02/26/2019   CREATININE 1.43 (H) 02/26/2019   CALCIUM 8.8 02/26/2019   GFRNONAA 37 (L) 02/26/2019   GFRAA 43 (L) 02/26/2019     Wt Readings from Last 3 Encounters:  10/06/19 198 lb (89.8 kg)  02/24/19 177 lb (80.3 kg)  02/03/19 176 lb 9.6 oz (80.1 kg)    -----------------------------------------------------------------------------------------  Past Medical History:  Diagnosis Date  . ALT (SGPT) level raised   . Anemia   . Anxiety   . Arthritis   . Depression   . Diabetes mellitus without complication ([place])   . Elevated cholesterol     . GERD (gastroesophageal reflux disease)   . Hemorrhoids   . Hypertension   . Kidney disease    Past Surgical History:  Procedure Laterality Date  . abdominal blockage    . CESAREAN SECTION  1983  . CESAREAN SECTION    . CHOLECYSTECTOMY    . COLONOSCOPY WITH PROPOFOL N/A 10/29/2015   Procedure: COLONOSCOPY WITH PROPOFOL;  Surgeon: Manya Silvas, MD;  Location: [facility] ENDOSCOPY;  Service: Endoscopy;  Laterality: N/A;  . COLONOSCOPY WITH PROPOFOL N/A 11/07/2017   Procedure: COLONOSCOPY WITH PROPOFOL;  Surgeon: Toledo, Benay Pike, MD;  Location: [facility] ENDOSCOPY;  Service: Gastroenterology;  Laterality: N/A;  . DILATION AND CURETTAGE OF UTERUS  2001  . ESOPHAGOGASTRODUODENOSCOPY (EGD) WITH PROPOFOL N/A 10/29/2015   Procedure: ESOPHAGOGASTRODUODENOSCOPY (EGD) WITH PROPOFOL;  Surgeon: Manya Silvas, MD;  Location: [facility] ENDOSCOPY;  Service: Endoscopy;  Laterality: N/A;   Social History   Tobacco Use  . Smoking status: Never Smoker  . Smokeless tobacco: Never Used  Substance Use Topics  . Alcohol use: No    Alcohol/week: 0.0 standard drinks  . Drug use: No   Family Status  Relation Name Status  . Mother  Deceased  . Father  Deceased at age 29  . Sister 1 Alive  . MGM  Deceased  . PGF  Deceased  . Sister 2 Alive  . Sister 3 Alive  .  MGF  Deceased  . PGM  Deceased  . Sister 4 Alive   No Known Allergies     Medications: Outpatient Medications Prior to Visit  Medication Sig  . acetaminophen (TYLENOL) 500 MG tablet Take 500 mg by mouth every 6 (six) hours as needed.   Marland Kitchen aspirin EC 81 MG tablet Take 81 mg by mouth daily.   . carvedilol (COREG) 12.5 MG tablet Take 12.5 mg by mouth 2 (two) times daily with a meal.  . Cranberry 125 MG TABS Take 1 tablet by mouth daily.  . cyanocobalamin (,VITAMIN B-12,) 1000 MCG/ML injection Inject into the muscle daily.  . DULoxetine (CYMBALTA) 60 MG capsule Take 1 capsule (60 mg total) by mouth daily.  . ferrous sulfate 325 (65 FE) MG tablet  Take 1 tablet by mouth daily.  Marland Kitchen glipiZIDE (GLUCOTROL) 10 MG tablet Take 1 tablet by mouth twice daily  . glucose blood (ONETOUCH ULTRA) test strip 1 each by Other route 3 (three) times daily. Use as instructed  . hydrOXYzine (ATARAX/VISTARIL) 10 MG tablet Take 1 tablet (10 mg total) by mouth 3 (three) times daily as needed.  . Insulin Glargine (BASAGLAR KWIKPEN) 100 UNIT/ML Inject 0.24 mLs (24 Units total) into the skin daily. (Patient taking differently: Inject 24 Units into the skin at bedtime. )  . lovastatin (MEVACOR) 40 MG tablet Take 1 tablet by mouth once daily  . metFORMIN (GLUCOPHAGE) 1000 MG tablet Take 500 mg by mouth 2 (two) times daily with a meal.   . omeprazole (PRILOSEC) 40 MG capsule Take 40 mg by mouth daily.   Glory Rosebush Delica Lancets 42H MISC USE ONE  TO CHECK GLUCOSE ONCE DAILY  . PROLENSA 0.07 % SOLN   . BESIVANCE 0.6 % SUSP INSTILL 1 DROP INTO RIGHT EYE THREE TIMES DAILY  . doxycycline (VIBRA-TABS) 100 MG tablet Take 1 tablet (100 mg total) by mouth 2 (two) times daily. (Patient not taking: Reported on 09/17/2019)  . DUREZOL 0.05 % EMUL INSTILL 1 DROP INTO RIGHT EYE THREE TIMES DAILY AS DIRECTED  . magnesium oxide (MAG-OX) 400 MG tablet Take 400 mg by mouth daily.  . [DISCONTINUED] vitamin B-12 (CYANOCOBALAMIN) 1000 MCG tablet Take 1,000 mcg by mouth daily.   No facility-administered medications prior to visit.    Review of Systems  Respiratory: Negative for chest tightness and shortness of breath.   Cardiovascular: Negative for chest pain, palpitations and leg swelling.  Gastrointestinal: Negative for abdominal pain.  Endocrine: Negative for polydipsia and polyuria.  Musculoskeletal: Negative for arthralgias and myalgias.  Neurological: Negative for dizziness and headaches.       Tingling in legs     Objective    BP 128/78 (BP Location: Right Arm, Patient Position: Sitting, Cuff Size: Normal)   Pulse 68   Temp (!) 96.8 F (36 C) (Skin)   Wt 198 lb (89.8 kg)    SpO2 99%   BMI 37.41 kg/m   Physical Exam Constitutional:      Appearance: Normal appearance.  HENT:     Head: Normocephalic and atraumatic.     Right Ear: Tympanic membrane, ear canal and external ear normal.     Left Ear: Tympanic membrane, ear canal and external ear normal.     Nose: Nose normal.     Mouth/Throat:     Mouth: Mucous membranes are dry.  Eyes:     Extraocular Movements: Extraocular movements intact.     Conjunctiva/sclera: Conjunctivae normal.     Pupils: Pupils are equal,  round, and reactive to light.  Neck:     Vascular: No carotid bruit.  Cardiovascular:     Rate and Rhythm: Normal rate and regular rhythm.     Pulses: Normal pulses.     Heart sounds: Normal heart sounds.     Comments: Pedal pulse faint but equal.  Varicose veins bilat worse on right Callouses on right Pulmonary:     Effort: Pulmonary effort is normal.     Breath sounds: Normal breath sounds.  Abdominal:     General: Abdomen is flat. Bowel sounds are normal.     Palpations: Abdomen is soft.     Tenderness: There is no right CVA tenderness or left CVA tenderness.  Musculoskeletal:        General: Normal range of motion.     Cervical back: Normal range of motion and neck supple. No rigidity.     Right lower leg: No edema.     Left lower leg: No edema.  Lymphadenopathy:     Cervical: No cervical adenopathy.  Skin:    General: Skin is warm and dry.  Neurological:     Mental Status: She is alert.  Psychiatric:        Mood and Affect: Mood normal.        Behavior: Behavior normal.        Thought Content: Thought content normal.        Judgment: Judgment normal.    No results found for any visits on 10/06/19.   Assessment & Plan    1. Diabetes mellitus due to underlying condition with diabetic polyneuropathy, without long-term current use of insulin (HCC) FBS usually in the 100's but had a 240 recently. Continues to take Glipizide, Metformin and Basaglar daily. Will see  endocrinologist in July and ophthalmologist in June. Unchanged numbness/peripheral neuropathy in feet (R>L) without ulcerations. Recheck labs and continue endocrinology follow up. - Comprehensive metabolic panel - Hemoglobin A1c - Insulin Glargine (BASAGLAR KWIKPEN) 100 UNIT/ML; Inject 0.24 mLs (24 Units total) into the skin daily.  Dispense: 9 mL; Refill: 0  2. Essential hypertension BP in good control with Carvedilol 12.5 mg BID. Recheck routine labs. - CBC with Differential/Platelet - Comprehensive metabolic panel - TSH  3. Combined fat and carbohydrate induced hyperlipemia Last lipids in good shape June 2020. Continues to tolerate Lovastatin 40 mg qd. Recheck CMP. - Comprehensive metabolic panel  4. [place] of bladder Still has to use catheter 3 times a day. No urination without catheter. Medically necessary to continue closed system catheters three times daily due to permanent urinary retention and to prevent recurring UTI's. Recheck labs to assess for anemia and renal function. - CBC with Differential/Platelet - Comprehensive metabolic panel       Cynthia Sims, [place] [phone] (phone) [phone] (fax)  [place]

## 2019-10-06 ENCOUNTER — Ambulatory Visit (INDEPENDENT_AMBULATORY_CARE_PROVIDER_SITE_OTHER): Payer: Medicare HMO | Admitting: Family Medicine

## 2019-10-06 ENCOUNTER — Other Ambulatory Visit: Payer: Self-pay

## 2019-10-06 ENCOUNTER — Encounter: Payer: Self-pay | Admitting: Family Medicine

## 2019-10-06 VITALS — BP 128/78 | HR 68 | Temp 96.8°F | Wt 198.0 lb

## 2019-10-06 DIAGNOSIS — E0842 Diabetes mellitus due to underlying condition with diabetic polyneuropathy: Secondary | ICD-10-CM | POA: Diagnosis not present

## 2019-10-06 DIAGNOSIS — I10 Essential (primary) hypertension: Secondary | ICD-10-CM | POA: Diagnosis not present

## 2019-10-06 DIAGNOSIS — E782 Mixed hyperlipidemia: Secondary | ICD-10-CM | POA: Diagnosis not present

## 2019-10-06 DIAGNOSIS — N312 Flaccid neuropathic bladder, not elsewhere classified: Secondary | ICD-10-CM

## 2019-10-06 MED ORDER — BASAGLAR KWIKPEN 100 UNIT/ML ~~LOC~~ SOPN: 24.0000 [IU] | PEN_INJECTOR | Freq: Every day | SUBCUTANEOUS | 0 refills | Status: DC

## 2019-10-07 LAB — COMPREHENSIVE METABOLIC PANEL
ALT: 16 IU/L (ref 0–32)
AST: 17 IU/L (ref 0–40)
Albumin/Globulin Ratio: 1.6 (ref 1.2–2.2)
Albumin: 4 g/dL (ref 3.7–4.7)
Alkaline Phosphatase: 66 IU/L (ref 39–117)
BUN/Creatinine Ratio: 16 (ref 12–28)
BUN: 24 mg/dL (ref 8–27)
Bilirubin Total: 0.2 mg/dL (ref 0.0–1.2)
CO2: 18 mmol/L — ABNORMAL LOW (ref 20–29)
Calcium: 8.8 mg/dL (ref 8.7–10.3)
Chloride: 104 mmol/L (ref 96–106)
Creatinine, Ser: 1.46 mg/dL — ABNORMAL HIGH (ref 0.57–1.00)
GFR calc Af Amer: 41 mL/min/{1.73_m2} — ABNORMAL LOW (ref >59)
GFR calc non Af Amer: 36 mL/min/{1.73_m2} — ABNORMAL LOW (ref >59)
Globulin, Total: 2.5 g/dL (ref 1.5–4.5)
Glucose: 113 mg/dL — ABNORMAL HIGH (ref 65–99)
Potassium: 4.7 mmol/L (ref 3.5–5.2)
Sodium: 137 mmol/L (ref 134–144)
Total Protein: 6.5 g/dL (ref 6.0–8.5)

## 2019-10-07 LAB — CBC WITH DIFFERENTIAL/PLATELET
Basophils Absolute: 0 10*3/uL (ref 0.0–0.2)
Basos: 0 %
EOS (ABSOLUTE): 0.1 10*3/uL (ref 0.0–0.4)
Eos: 1 %
Hematocrit: 33.8 % — ABNORMAL LOW (ref 34.0–46.6)
Hemoglobin: 11.7 g/dL (ref 11.1–15.9)
Immature Grans (Abs): 0 10*3/uL (ref 0.0–0.1)
Immature Granulocytes: 0 %
Lymphocytes Absolute: 2.3 10*3/uL (ref 0.7–3.1)
Lymphs: 35 %
MCH: 31.9 pg (ref 26.6–33.0)
MCHC: 34.6 g/dL (ref 31.5–35.7)
MCV: 92 fL (ref 79–97)
Monocytes Absolute: 0.5 10*3/uL (ref 0.1–0.9)
Monocytes: 8 %
Neutrophils Absolute: 3.5 10*3/uL (ref 1.4–7.0)
Neutrophils: 56 %
Platelets: 157 10*3/uL (ref 150–450)
RBC: 3.67 x10E6/uL — ABNORMAL LOW (ref 3.77–5.28)
RDW: 13 % (ref 11.7–15.4)
WBC: 6.4 10*3/uL (ref 3.4–10.8)

## 2019-10-07 LAB — TSH: TSH: 1.32 u[IU]/mL (ref 0.450–4.500)

## 2019-10-07 LAB — HEMOGLOBIN A1C
Est. average glucose Bld gHb Est-mCnc: 157 mg/dL
Hgb A1c MFr Bld: 7.1 % — ABNORMAL HIGH (ref 4.8–5.6)

## 2019-10-11 DIAGNOSIS — R69 Illness, unspecified: Secondary | ICD-10-CM | POA: Diagnosis not present

## 2019-10-16 DIAGNOSIS — R69 Illness, unspecified: Secondary | ICD-10-CM | POA: Diagnosis not present

## 2019-10-22 ENCOUNTER — Telehealth: Payer: Self-pay | Admitting: Family Medicine

## 2019-10-22 NOTE — Telephone Encounter (Signed)
I think this probably went to medical records, can you please check

## 2019-10-22 NOTE — Telephone Encounter (Signed)
Copied from [place] [phone]. Topic: General - Other >> Oct 22, 2019 11:16 AM Keene Breath wrote: Reason for CRM: Called to ask nurse to call her regarding some paperwork that was sent over for a chart request on patient.  Please call her to discuss at [phone], ext. [phone]

## 2019-10-22 NOTE — Telephone Encounter (Signed)
Called Misty with [employer]. No answer and left message to return call. Need more information about the forms they are calling about. Thanks TNP

## 2019-10-22 NOTE — Telephone Encounter (Signed)
Spoke with [employer]. I request that she resend the request for pt's OV note from 10/06/2019 and a order. TNP

## 2019-10-22 NOTE — Telephone Encounter (Signed)
Does this need to be handled by medical records?

## 2019-10-23 NOTE — Telephone Encounter (Signed)
FYI- Order and request for OV note from 10/06/2019 was received this morning via fax from 10/22/2019. Both visit note 10/06/2019 and order were faxed this afternoon. TNP

## 2019-10-24 DIAGNOSIS — E119 Type 2 diabetes mellitus without complications: Secondary | ICD-10-CM | POA: Diagnosis not present

## 2019-10-24 DIAGNOSIS — R159 Full incontinence of feces: Secondary | ICD-10-CM | POA: Diagnosis not present

## 2019-10-24 DIAGNOSIS — R69 Illness, unspecified: Secondary | ICD-10-CM | POA: Diagnosis not present

## 2019-10-24 DIAGNOSIS — I10 Essential (primary) hypertension: Secondary | ICD-10-CM | POA: Diagnosis not present

## 2019-10-24 DIAGNOSIS — Z794 Long term (current) use of insulin: Secondary | ICD-10-CM | POA: Diagnosis not present

## 2019-10-24 DIAGNOSIS — K6289 Other specified diseases of anus and rectum: Secondary | ICD-10-CM | POA: Diagnosis not present

## 2019-10-24 DIAGNOSIS — Z79899 Other long term (current) drug therapy: Secondary | ICD-10-CM | POA: Diagnosis not present

## 2019-10-27 ENCOUNTER — Telehealth: Payer: Self-pay

## 2019-10-27 NOTE — Telephone Encounter (Signed)
I am sending this up front for you Thanks

## 2019-10-27 NOTE — Telephone Encounter (Signed)
Copied from [place] [phone]. Topic: General - Other >> Oct 27, 2019  1:42 PM Gearldine Shown wrote: Reason for CRM: Misty S. Called  to get provider's signature on addenum on the chart note regarding the catheter . The doctor needs to print, date and sign it.

## 2019-10-27 NOTE — Telephone Encounter (Signed)
Printed copy of office visit with addendum and signed. Placed copy on Elena's desk.

## 2019-10-28 NOTE — Telephone Encounter (Signed)
Completed. TNP

## 2019-11-11 ENCOUNTER — Other Ambulatory Visit: Payer: Self-pay | Admitting: Family Medicine

## 2019-11-11 DIAGNOSIS — N183 Chronic kidney disease, stage 3 unspecified: Secondary | ICD-10-CM

## 2019-11-11 DIAGNOSIS — E1122 Type 2 diabetes mellitus with diabetic chronic kidney disease: Secondary | ICD-10-CM

## 2019-11-11 NOTE — Telephone Encounter (Signed)
Requested Prescriptions  Pending Prescriptions Disp Refills  . glipiZIDE (GLUCOTROL) 10 MG tablet [Pharmacy Med Name: glipiZIDE 10 MG Oral Tablet] 180 tablet 0    Sig: Take 1 tablet by mouth twice daily     Endocrinology:  Diabetes - Sulfonylureas Passed - 11/11/2019  6:20 PM      Passed - HBA1C is between 0 and 7.9 and within 180 days    Hgb A1c MFr Bld  Date Value Ref Range Status  10/06/2019 7.1 (H) 4.8 - 5.6 % Final    Comment:             Prediabetes: 5.7 - 6.4          Diabetes: >6.4          Glycemic control for adults with diabetes: <7.0          Passed - Valid encounter within last 6 months    Recent Outpatient Visits          1 month ago Diabetes mellitus due to underlying condition with diabetic polyneuropathy, without long-term current use of insulin ([place])   [facility], [place], [place]   8 months ago Gallstone pancreatitis   [employer] [facility], [place], [place]   9 months ago [place], [place], [place]   11 months ago Diabetes mellitus due to underlying condition with diabetic polyneuropathy, without long-term current use of insulin [facility])   [place], [place], [place]   1 year ago Diabetes mellitus due to underlying condition with diabetic polyneuropathy, without long-term current use of insulin [facility])   [place], [place], [place]             [place]. To see Endocrinologist in July; refilled 30 day supply with 1 refill at this time.

## 2019-11-14 DIAGNOSIS — R69 Illness, unspecified: Secondary | ICD-10-CM | POA: Diagnosis not present

## 2019-12-20 DIAGNOSIS — R69 Illness, unspecified: Secondary | ICD-10-CM | POA: Diagnosis not present

## 2019-12-29 DIAGNOSIS — H52221 Regular astigmatism, right eye: Secondary | ICD-10-CM | POA: Diagnosis not present

## 2019-12-29 DIAGNOSIS — H47022 Hemorrhage in optic nerve sheath, left eye: Secondary | ICD-10-CM | POA: Diagnosis not present

## 2019-12-29 DIAGNOSIS — E119 Type 2 diabetes mellitus without complications: Secondary | ICD-10-CM | POA: Diagnosis not present

## 2019-12-29 DIAGNOSIS — H5201 Hypermetropia, right eye: Secondary | ICD-10-CM | POA: Diagnosis not present

## 2019-12-29 DIAGNOSIS — Z961 Presence of intraocular lens: Secondary | ICD-10-CM | POA: Diagnosis not present

## 2019-12-29 DIAGNOSIS — H35033 Hypertensive retinopathy, bilateral: Secondary | ICD-10-CM | POA: Diagnosis not present

## 2019-12-29 DIAGNOSIS — Z0100 Encounter for examination of eyes and vision without abnormal findings: Secondary | ICD-10-CM | POA: Diagnosis not present

## 2019-12-29 DIAGNOSIS — Z7984 Long term (current) use of oral hypoglycemic drugs: Secondary | ICD-10-CM | POA: Diagnosis not present

## 2019-12-29 DIAGNOSIS — H40012 Open angle with borderline findings, low risk, left eye: Secondary | ICD-10-CM | POA: Diagnosis not present

## 2019-12-29 LAB — HM DIABETES EYE EXAM

## 2019-12-30 ENCOUNTER — Encounter: Payer: Self-pay | Admitting: Physician Assistant

## 2019-12-30 ENCOUNTER — Ambulatory Visit (INDEPENDENT_AMBULATORY_CARE_PROVIDER_SITE_OTHER): Payer: Medicare HMO | Admitting: Physician Assistant

## 2019-12-30 ENCOUNTER — Other Ambulatory Visit: Payer: Self-pay

## 2019-12-30 VITALS — BP 136/71 | HR 71 | Temp 98.2°F | Ht 61.0 in | Wt 191.0 lb

## 2019-12-30 DIAGNOSIS — I10 Essential (primary) hypertension: Secondary | ICD-10-CM | POA: Diagnosis not present

## 2019-12-30 NOTE — Progress Notes (Signed)
Established patient visit   [name]   DOB: 1950/12/30   69 y.o. Female  MRN: [id number] Visit Date: 12/30/2019  Today's healthcare provider: Trinna Post, PA-C   Chief Complaint  Patient presents with  . Hypertension   Subjective    HPI  Hypertension, follow-up  BP Readings from Last 3 Encounters:  12/30/19 136/71  10/06/19 128/78  02/24/19 112/74   Wt Readings from Last 3 Encounters:  12/30/19 191 lb (86.6 kg)  10/06/19 198 lb (89.8 kg)  02/24/19 177 lb (80.3 kg)     She was last seen for hypertension 5 months ago.  BP at that visit was 112/74. Management since that visit includes no changes. She continues on carvedilol 12.5 mg BID prescribed by Dr. Donnal Debar. Was previously on lisinopril-HCTZ but this was discontinued in 02/2019 after AKI 2/2 pancreatitis hospitalization.  She reports good compliance with treatment. She is not having side effects.  She is following a Regular diet. She is not exercising. She does not smoke.  Use of agents associated with hypertension: none.   Outside blood pressures are not being checked at home. Symptoms: No chest pain No chest pressure  No palpitations No syncope  No dyspnea No orthopnea  No paroxysmal nocturnal dyspnea No lower extremity edema   Pertinent labs: Lab Results  Component Value Date   CHOL 158 12/03/2018   HDL 60 12/03/2018   LDLCALC 71 12/03/2018   TRIG 134 12/03/2018   CHOLHDL 2.6 12/03/2018   Lab Results  Component Value Date   NA 137 10/06/2019   K 4.7 10/06/2019   CREATININE 1.46 (H) 10/06/2019   GFRNONAA 36 (L) 10/06/2019   GFRAA 41 (L) 10/06/2019   GLUCOSE 113 (H) 10/06/2019     The 10-year ASCVD risk score Mikey Bussing DC Jr., et al., 2013) is: 25.9%   Patient reports that she was seen at the eye doctor, and they advised her to come get her BP checked.       Medications: Outpatient Medications Prior to Visit  Medication Sig  . acetaminophen (TYLENOL) 500 MG tablet Take 500  mg by mouth every 6 (six) hours as needed.   Marland Kitchen aspirin EC 81 MG tablet Take 81 mg by mouth daily.   . carvedilol (COREG) 12.5 MG tablet Take 12.5 mg by mouth 2 (two) times daily with a meal.  . Cranberry 125 MG TABS Take 1 tablet by mouth daily.  . cyanocobalamin (,VITAMIN B-12,) 1000 MCG/ML injection Inject into the muscle daily.  . DULoxetine (CYMBALTA) 60 MG capsule Take 1 capsule (60 mg total) by mouth daily.  . ferrous sulfate 325 (65 FE) MG tablet Take 1 tablet by mouth daily.  Marland Kitchen glipiZIDE (GLUCOTROL) 10 MG tablet Take 1 tablet by mouth twice daily  . glucose blood (ONETOUCH ULTRA) test strip 1 each by Other route 3 (three) times daily. Use as instructed  . hydrOXYzine (ATARAX/VISTARIL) 10 MG tablet Take 1 tablet (10 mg total) by mouth 3 (three) times daily as needed.  . Insulin Glargine (BASAGLAR KWIKPEN) 100 UNIT/ML Inject 0.24 mLs (24 Units total) into the skin daily.  Marland Kitchen lovastatin (MEVACOR) 40 MG tablet Take 1 tablet by mouth once daily  . metFORMIN (GLUCOPHAGE) 1000 MG tablet Take 500 mg by mouth 2 (two) times daily with a meal.   . omeprazole (PRILOSEC) 40 MG capsule Take 40 mg by mouth daily.   Glory Rosebush Delica Lancets 99J MISC USE ONE  TO CHECK GLUCOSE ONCE DAILY  .  PROLENSA 0.07 % SOLN    No facility-administered medications prior to visit.    Review of Systems  Constitutional: Negative.   Respiratory: Negative.   Cardiovascular: Negative.   Musculoskeletal: Negative.   Skin: Negative.   Psychiatric/Behavioral: Negative.       Objective    BP 136/71 (BP Location: Right Arm)   Pulse 71   Temp 98.2 F (36.8 C)   Ht 5\' 1"  (1.549 m)   Wt 191 lb (86.6 kg)   BMI 36.09 kg/m    Physical Exam Cardiovascular:     Rate and Rhythm: Normal rate and regular rhythm.     Pulses: Normal pulses.     Heart sounds: Normal heart sounds.  Pulmonary:     Effort: Pulmonary effort is normal.     Breath sounds: Normal breath sounds.  Skin:    General: Skin is warm and dry.    Neurological:     Mental Status: She is alert and oriented to person, place, and time. Mental status is at baseline.  Psychiatric:        Mood and Affect: Mood normal.        Behavior: Behavior normal.       No results found for any visits on 12/30/19.  Assessment & Plan    1. Essential hypertension Blood pressure is stable in the office today. Continue current medication. I do not think she needs any adjustments on her blood pressure medication. We will forward this note to her ophthalmologist for their records.         ITrinna Post, PA-C, have reviewed all documentation for this visit. The documentation on 12/30/19 for the exam, diagnosis, procedures, and orders are all accurate and complete.    Paulene Floor  [facility] [phone] (phone) [phone] (fax)  [place]

## 2020-01-10 DIAGNOSIS — R69 Illness, unspecified: Secondary | ICD-10-CM | POA: Diagnosis not present

## 2020-01-18 DIAGNOSIS — R69 Illness, unspecified: Secondary | ICD-10-CM | POA: Diagnosis not present

## 2020-01-20 DIAGNOSIS — R69 Illness, unspecified: Secondary | ICD-10-CM | POA: Diagnosis not present

## 2020-01-29 ENCOUNTER — Other Ambulatory Visit: Payer: Self-pay | Admitting: Family Medicine

## 2020-01-29 DIAGNOSIS — N183 Chronic kidney disease, stage 3 unspecified: Secondary | ICD-10-CM

## 2020-01-29 DIAGNOSIS — E1122 Type 2 diabetes mellitus with diabetic chronic kidney disease: Secondary | ICD-10-CM

## 2020-01-29 NOTE — Telephone Encounter (Signed)
Requested medication (s) are due for refill today: Yes  Requested medication (s) are on the active medication list: Yes  Last refill:  09/29/19  Future visit scheduled: No  Notes to clinic:  Failed Antilipid labs on protocol, unable to refill     Requested Prescriptions  Pending Prescriptions Disp Refills   lovastatin (MEVACOR) 40 MG tablet [Pharmacy Med Name: Lovastatin 40 MG Oral Tablet] 90 tablet 0    Sig: Take 1 tablet by mouth once daily      Cardiovascular:  Antilipid - Statins Failed - 01/29/2020 10:59 AM      Failed - Total Cholesterol in normal range and within 360 days    Cholesterol, Total  Date Value Ref Range Status  12/03/2018 158 100 - 199 mg/dL Final          Failed - LDL in normal range and within 360 days    LDL Calculated  Date Value Ref Range Status  12/03/2018 71 0 - 99 mg/dL Final          Failed - HDL in normal range and within 360 days    HDL  Date Value Ref Range Status  12/03/2018 60 >39 mg/dL Final          Failed - Triglycerides in normal range and within 360 days    Triglycerides  Date Value Ref Range Status  12/03/2018 134 0 - 149 mg/dL Final          Passed - Patient is not pregnant      Passed - Valid encounter within last 12 months    Recent Outpatient Visits           1 month ago Essential hypertension   [facility], Adriana M, PA-C   3 months ago Diabetes mellitus due to underlying condition with diabetic polyneuropathy, without long-term current use of insulin ([place])   [facility], [place], [place]   11 months ago Gallstone pancreatitis   [employer] [facility], [place], [place]   12 months ago [place], [place], [place]   1 year ago Diabetes mellitus due to underlying condition with diabetic polyneuropathy, without long-term current use of insulin [facility])   [place], [place], [place]

## 2020-02-07 ENCOUNTER — Other Ambulatory Visit: Payer: Self-pay | Admitting: Family Medicine

## 2020-02-07 DIAGNOSIS — E1122 Type 2 diabetes mellitus with diabetic chronic kidney disease: Secondary | ICD-10-CM

## 2020-02-07 DIAGNOSIS — N183 Chronic kidney disease, stage 3 unspecified: Secondary | ICD-10-CM

## 2020-02-17 DIAGNOSIS — Z03818 Encounter for observation for suspected exposure to other biological agents ruled out: Secondary | ICD-10-CM | POA: Diagnosis not present

## 2020-02-17 DIAGNOSIS — Z20822 Contact with and (suspected) exposure to covid-19: Secondary | ICD-10-CM | POA: Diagnosis not present

## 2020-02-17 DIAGNOSIS — U071 COVID-19: Secondary | ICD-10-CM | POA: Diagnosis not present

## 2020-03-11 DIAGNOSIS — H47022 Hemorrhage in optic nerve sheath, left eye: Secondary | ICD-10-CM | POA: Diagnosis not present

## 2020-03-11 DIAGNOSIS — Z961 Presence of intraocular lens: Secondary | ICD-10-CM | POA: Diagnosis not present

## 2020-03-11 DIAGNOSIS — H35033 Hypertensive retinopathy, bilateral: Secondary | ICD-10-CM | POA: Diagnosis not present

## 2020-03-11 DIAGNOSIS — H40012 Open angle with borderline findings, low risk, left eye: Secondary | ICD-10-CM | POA: Diagnosis not present

## 2020-03-18 DIAGNOSIS — N319 Neuromuscular dysfunction of bladder, unspecified: Secondary | ICD-10-CM | POA: Diagnosis not present

## 2020-03-18 DIAGNOSIS — N312 Flaccid neuropathic bladder, not elsewhere classified: Secondary | ICD-10-CM | POA: Diagnosis not present

## 2020-03-18 DIAGNOSIS — R339 Retention of urine, unspecified: Secondary | ICD-10-CM | POA: Diagnosis not present

## 2020-03-18 DIAGNOSIS — N390 Urinary tract infection, site not specified: Secondary | ICD-10-CM | POA: Diagnosis not present

## 2020-04-09 ENCOUNTER — Other Ambulatory Visit: Payer: Self-pay | Admitting: Family Medicine

## 2020-04-09 DIAGNOSIS — R69 Illness, unspecified: Secondary | ICD-10-CM | POA: Diagnosis not present

## 2020-04-09 DIAGNOSIS — E0842 Diabetes mellitus due to underlying condition with diabetic polyneuropathy: Secondary | ICD-10-CM

## 2020-04-09 DIAGNOSIS — F411 Generalized anxiety disorder: Secondary | ICD-10-CM

## 2020-04-13 ENCOUNTER — Ambulatory Visit: Payer: Medicare HMO | Admitting: Physical Therapy

## 2020-04-19 ENCOUNTER — Ambulatory Visit: Payer: Medicare HMO

## 2020-04-30 ENCOUNTER — Other Ambulatory Visit: Payer: Self-pay | Admitting: Family Medicine

## 2020-04-30 DIAGNOSIS — E1122 Type 2 diabetes mellitus with diabetic chronic kidney disease: Secondary | ICD-10-CM

## 2020-04-30 DIAGNOSIS — N183 Chronic kidney disease, stage 3 unspecified: Secondary | ICD-10-CM

## 2020-06-21 ENCOUNTER — Other Ambulatory Visit: Payer: Self-pay | Admitting: Family Medicine

## 2020-06-21 DIAGNOSIS — N183 Chronic kidney disease, stage 3 unspecified: Secondary | ICD-10-CM

## 2020-06-21 DIAGNOSIS — E1122 Type 2 diabetes mellitus with diabetic chronic kidney disease: Secondary | ICD-10-CM

## 2020-07-13 DIAGNOSIS — N390 Urinary tract infection, site not specified: Secondary | ICD-10-CM | POA: Diagnosis not present

## 2020-07-13 DIAGNOSIS — N319 Neuromuscular dysfunction of bladder, unspecified: Secondary | ICD-10-CM | POA: Diagnosis not present

## 2020-07-13 DIAGNOSIS — N312 Flaccid neuropathic bladder, not elsewhere classified: Secondary | ICD-10-CM | POA: Diagnosis not present

## 2020-07-13 DIAGNOSIS — R339 Retention of urine, unspecified: Secondary | ICD-10-CM | POA: Diagnosis not present

## 2020-08-25 ENCOUNTER — Other Ambulatory Visit: Payer: Self-pay | Admitting: Family Medicine

## 2020-08-25 DIAGNOSIS — E0842 Diabetes mellitus due to underlying condition with diabetic polyneuropathy: Secondary | ICD-10-CM

## 2020-08-25 DIAGNOSIS — F411 Generalized anxiety disorder: Secondary | ICD-10-CM

## 2020-08-25 NOTE — Telephone Encounter (Signed)
Requested Prescriptions  Pending Prescriptions Disp Refills  . Lancets (ONETOUCH DELICA PLUS [id number]) [place] [Pharmacy Med Name: ONETOUCH DELICA LAN 34J MIS] 179 each 0    Sig: USE 1  TO CHECK GLUCOSE ONCE DAILY     Endocrinology: Diabetes - Testing Supplies Passed - 08/25/2020  7:30 AM      Passed - Valid encounter within last 12 months    Recent Outpatient Visits          7 months ago Essential hypertension   [place], Adriana M, PA-C   10 months ago Diabetes mellitus due to underlying condition with diabetic polyneuropathy, without long-term current use of insulin ([place])   [place], Vickki Muff, PA-C   1 year ago Gallstone pancreatitis   [facility] [facility], Vickki Muff, PA-C   1 year ago [place], [place], [place]   1 year ago Diabetes mellitus due to underlying condition with diabetic polyneuropathy, without long-term current use of insulin [facility])   [place], Vickki Muff, [place]

## 2020-08-25 NOTE — Telephone Encounter (Signed)
Requested medication (s) are due for refill today: no  Requested medication (s) are on the active medication list:  yes  Last refill: 04/09/2020  Future visit scheduled: no   Notes to clinic:  overdue for follow up appointment LOV 12/30/2019    Requested Prescriptions  Pending Prescriptions Disp Refills   DULoxetine (CYMBALTA) 60 MG capsule [Pharmacy Med Name: DULoxetine HCl 60 MG Oral Capsule Delayed Release Particles] 90 capsule 0    Sig: Take 1 capsule by mouth once daily      Psychiatry: Antidepressants - SNRI Failed - 08/25/2020  7:33 AM      Failed - Valid encounter within last 6 months    Recent Outpatient Visits           7 months ago Essential hypertension   [place], Adriana M, PA-C   10 months ago Diabetes mellitus due to underlying condition with diabetic polyneuropathy, without long-term current use of insulin ([place])   [place], Vickki Muff, PA-C   1 year ago Gallstone pancreatitis   [facility] [facility], Vickki Muff, PA-C   1 year ago [place], [place], [place]   1 year ago Diabetes mellitus due to underlying condition with diabetic polyneuropathy, without long-term current use of insulin [facility])   [place], Vickki Muff, PA-C                Passed - Last BP in normal range    BP Readings from Last 1 Encounters:  12/30/19 136/71

## 2020-09-05 DIAGNOSIS — E119 Type 2 diabetes mellitus without complications: Secondary | ICD-10-CM | POA: Diagnosis not present

## 2020-09-05 DIAGNOSIS — Z803 Family history of malignant neoplasm of breast: Secondary | ICD-10-CM | POA: Diagnosis not present

## 2020-09-05 DIAGNOSIS — E785 Hyperlipidemia, unspecified: Secondary | ICD-10-CM | POA: Diagnosis not present

## 2020-09-05 DIAGNOSIS — F411 Generalized anxiety disorder: Secondary | ICD-10-CM | POA: Diagnosis not present

## 2020-09-05 DIAGNOSIS — E669 Obesity, unspecified: Secondary | ICD-10-CM | POA: Diagnosis not present

## 2020-09-05 DIAGNOSIS — R69 Illness, unspecified: Secondary | ICD-10-CM | POA: Diagnosis not present

## 2020-09-05 DIAGNOSIS — Z7984 Long term (current) use of oral hypoglycemic drugs: Secondary | ICD-10-CM | POA: Diagnosis not present

## 2020-09-05 DIAGNOSIS — Z7722 Contact with and (suspected) exposure to environmental tobacco smoke (acute) (chronic): Secondary | ICD-10-CM | POA: Diagnosis not present

## 2020-09-05 DIAGNOSIS — Z6831 Body mass index (BMI) 31.0-31.9, adult: Secondary | ICD-10-CM | POA: Diagnosis not present

## 2020-09-05 DIAGNOSIS — R030 Elevated blood-pressure reading, without diagnosis of hypertension: Secondary | ICD-10-CM | POA: Diagnosis not present

## 2020-09-20 ENCOUNTER — Other Ambulatory Visit: Payer: Self-pay | Admitting: Family Medicine

## 2020-09-20 DIAGNOSIS — E1122 Type 2 diabetes mellitus with diabetic chronic kidney disease: Secondary | ICD-10-CM

## 2020-09-20 DIAGNOSIS — N183 Chronic kidney disease, stage 3 unspecified: Secondary | ICD-10-CM

## 2020-09-24 ENCOUNTER — Ambulatory Visit: Payer: Medicare HMO | Admitting: Family Medicine

## 2020-10-14 ENCOUNTER — Ambulatory Visit: Payer: Medicare HMO | Admitting: Family Medicine

## 2020-10-14 DIAGNOSIS — H35033 Hypertensive retinopathy, bilateral: Secondary | ICD-10-CM | POA: Diagnosis not present

## 2020-10-14 DIAGNOSIS — Z961 Presence of intraocular lens: Secondary | ICD-10-CM | POA: Diagnosis not present

## 2020-10-14 DIAGNOSIS — E119 Type 2 diabetes mellitus without complications: Secondary | ICD-10-CM | POA: Diagnosis not present

## 2020-10-14 DIAGNOSIS — H47023 Hemorrhage in optic nerve sheath, bilateral: Secondary | ICD-10-CM | POA: Diagnosis not present

## 2020-10-14 DIAGNOSIS — Z794 Long term (current) use of insulin: Secondary | ICD-10-CM | POA: Diagnosis not present

## 2020-10-15 ENCOUNTER — Ambulatory Visit: Payer: Medicare HMO | Admitting: Family Medicine

## 2020-10-18 ENCOUNTER — Other Ambulatory Visit: Payer: Self-pay

## 2020-10-18 ENCOUNTER — Encounter: Payer: Self-pay | Admitting: Family Medicine

## 2020-10-18 ENCOUNTER — Ambulatory Visit (INDEPENDENT_AMBULATORY_CARE_PROVIDER_SITE_OTHER): Payer: Medicare HMO | Admitting: Family Medicine

## 2020-10-18 VITALS — BP 162/76 | HR 80 | Temp 98.2°F | Resp 18 | Wt 190.0 lb

## 2020-10-18 DIAGNOSIS — I10 Essential (primary) hypertension: Secondary | ICD-10-CM | POA: Diagnosis not present

## 2020-10-18 DIAGNOSIS — N183 Chronic kidney disease, stage 3 unspecified: Secondary | ICD-10-CM | POA: Diagnosis not present

## 2020-10-18 DIAGNOSIS — K219 Gastro-esophageal reflux disease without esophagitis: Secondary | ICD-10-CM | POA: Diagnosis not present

## 2020-10-18 DIAGNOSIS — E1122 Type 2 diabetes mellitus with diabetic chronic kidney disease: Secondary | ICD-10-CM | POA: Diagnosis not present

## 2020-10-18 MED ORDER — METFORMIN HCL 500 MG PO TABS: 500.0000 mg | ORAL_TABLET | Freq: Two times a day (BID) | ORAL | 3 refills | Status: DC

## 2020-10-18 MED ORDER — CARVEDILOL 12.5 MG PO TABS: 12.5000 mg | ORAL_TABLET | Freq: Two times a day (BID) | ORAL | 3 refills | Status: DC

## 2020-10-18 MED ORDER — OMEPRAZOLE 40 MG PO CPDR: 40.0000 mg | DELAYED_RELEASE_CAPSULE | Freq: Every day | ORAL | 1 refills | Status: DC

## 2020-10-18 MED ORDER — GLIPIZIDE 10 MG PO TABS: 10.0000 mg | ORAL_TABLET | Freq: Two times a day (BID) | ORAL | 3 refills | Status: DC

## 2020-10-18 NOTE — Progress Notes (Signed)
Established patient visit   [name]   DOB: 1951-05-15   70 y.o. Female  MRN: [id number] Visit Date: 10/18/2020  Today's healthcare provider: [name], PA-C   No chief complaint on file.  Subjective    HPI  Hypertension, follow-up  BP Readings from Last 3 Encounters:  12/30/19 136/71  10/06/19 128/78  02/24/19 112/74   Wt Readings from Last 3 Encounters:  12/30/19 191 lb (86.6 kg)  10/06/19 198 lb (89.8 kg)  02/24/19 177 lb (80.3 kg)     She was last seen for hypertension 1 years ago.  BP at that visit was as above. Management since that visit includes none.  She reports good compliance with treatment. She is not having side effects.  She is following a Diabetic diet. She is not exercising. She does not smoke.  Use of agents associated with hypertension: none.   Outside blood pressures are stable. Symptoms: No chest pain No chest pressure  No palpitations No syncope  No dyspnea No orthopnea  No paroxysmal nocturnal dyspnea No lower extremity edema   Pertinent labs: Lab Results  Component Value Date   CHOL 158 12/03/2018   HDL 60 12/03/2018   LDLCALC 71 12/03/2018   TRIG 134 12/03/2018   CHOLHDL 2.6 12/03/2018   Lab Results  Component Value Date   NA 137 10/06/2019   K 4.7 10/06/2019   CREATININE 1.46 (H) 10/06/2019   GFRNONAA 36 (L) 10/06/2019   GFRAA 41 (L) 10/06/2019   GLUCOSE 113 (H) 10/06/2019     The 10-year ASCVD risk score Mikey Bussing DC Jr., et al., 2013) is: 28.9%   --------------------------------------------------------------------------------------------------- Diabetes Mellitus Type II, Follow-up  Lab Results  Component Value Date   HGBA1C 7.1 (H) 10/06/2019   HGBA1C 6.7 (H) 12/03/2018   HGBA1C 6.9 (A) 08/08/2018   Wt Readings from Last 3 Encounters:  12/30/19 191 lb (86.6 kg)  10/06/19 198 lb (89.8 kg)  02/24/19 177 lb (80.3 kg)   Last seen for diabetes 1 years ago.  Management since then includes  none. She reports fair compliance with treatment. She is not having side effects.  Symptoms: Yes fatigue No foot ulcerations  No appetite changes No nausea  Yes paresthesia of the feet  No polydipsia  No polyuria Yes visual disturbances   No vomiting     Home blood sugar records: fasting range: 100-120  Episodes of hypoglycemia? Yes Sometimes weak and sweating by drop to 50 two weeks ago.   Current insulin regiment: 24 units daily. Most Recent Eye Exam: 10-14-20 Current exercise: housecleaning Current diet habits: on average, 3 meals per day  Pertinent Labs: Lab Results  Component Value Date   CHOL 158 12/03/2018   HDL 60 12/03/2018   LDLCALC 71 12/03/2018   TRIG 134 12/03/2018   CHOLHDL 2.6 12/03/2018   Lab Results  Component Value Date   NA 137 10/06/2019   K 4.7 10/06/2019   CREATININE 1.46 (H) 10/06/2019   GFRNONAA 36 (L) 10/06/2019   GFRAA 41 (L) 10/06/2019   GLUCOSE 113 (H) 10/06/2019     ---------------------------------------------------------------------------------------------------   Past Medical History:  Diagnosis Date   ALT (SGPT) level raised    Anemia    Anxiety    Arthritis    Depression    Diabetes mellitus without complication (HCC)    Elevated cholesterol    GERD (gastroesophageal reflux disease)    Hemorrhoids    Hypertension    Kidney disease  Past Surgical History:  Procedure Laterality Date   abdominal blockage     CESAREAN SECTION  1983   CESAREAN SECTION     CHOLECYSTECTOMY     COLONOSCOPY WITH PROPOFOL N/A 10/29/2015   Procedure: COLONOSCOPY WITH PROPOFOL;  Surgeon: Manya Silvas, MD;  Location: [facility];  Service: Endoscopy;  Laterality: N/A;   COLONOSCOPY WITH PROPOFOL N/A 11/07/2017   Procedure: COLONOSCOPY WITH PROPOFOL;  Surgeon: Toledo, Benay Pike, MD;  Location: [facility] ENDOSCOPY;  Service: Gastroenterology;  Laterality: N/A;   DILATION AND CURETTAGE OF UTERUS  2001   ESOPHAGOGASTRODUODENOSCOPY (EGD) WITH PROPOFOL  N/A 10/29/2015   Procedure: ESOPHAGOGASTRODUODENOSCOPY (EGD) WITH PROPOFOL;  Surgeon: Manya Silvas, MD;  Location: [facility] ENDOSCOPY;  Service: Endoscopy;  Laterality: N/A;   Social History   Tobacco Use   Smoking status: Never Smoker   Smokeless tobacco: Never Used  Vaping Use   Vaping Use: Never used  Substance Use Topics   Alcohol use: No    Alcohol/week: 0.0 standard drinks   Drug use: No   Family History  Problem Relation Age of Onset   Alzheimer's disease Mother    COPD Mother    Breast cancer Mother 41   Osteoporosis Mother    Heart attack Father    Lupus Father    Diabetes Father    Hypertension Father    Diabetes Sister    COPD Maternal Grandmother    Diabetes Paternal Grandfather    Diabetes Sister    Diabetes Sister    No Known Allergies     Medications: Outpatient Medications Prior to Visit  Medication Sig   acetaminophen (TYLENOL) 500 MG tablet Take 500 mg by mouth every 6 (six) hours as needed.    aspirin EC 81 MG tablet Take 81 mg by mouth daily.    carvedilol (COREG) 12.5 MG tablet Take 12.5 mg by mouth 2 (two) times daily with a meal.   Cranberry 125 MG TABS Take 1 tablet by mouth daily.   cyanocobalamin (,VITAMIN B-12,) 1000 MCG/ML injection Inject into the muscle daily.   DULoxetine (CYMBALTA) 60 MG capsule Take 1 capsule by mouth once daily   ferrous sulfate 325 (65 FE) MG tablet Take 1 tablet by mouth daily.   glipiZIDE (GLUCOTROL) 10 MG tablet Take 1 tablet by mouth twice daily   glucose blood (ONETOUCH ULTRA) test strip 1 each by Other route 3 (three) times daily. Use as instructed   hydrOXYzine (ATARAX/VISTARIL) 10 MG tablet Take 1 tablet (10 mg total) by mouth 3 (three) times daily as needed.   Insulin Glargine (BASAGLAR KWIKPEN) 100 UNIT/ML Inject 0.24 mLs (24 Units total) into the skin daily.   Lancets (ONETOUCH DELICA PLUS [id number]) MISC USE 1  TO CHECK GLUCOSE ONCE DAILY   lovastatin (MEVACOR) 40 MG tablet Take 1 tablet by mouth once  daily   omeprazole (PRILOSEC) 40 MG capsule Take 40 mg by mouth daily.    PROLENSA 0.07 % SOLN    No facility-administered medications prior to visit.    Review of Systems  Constitutional: Negative.   HENT: Negative.    Respiratory: Negative.    Cardiovascular: Negative.   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      Objective    BP (!) 162/76   Pulse 80   Temp 98.2 F (36.8 C)   Resp 18   Wt 190 lb (86.2 kg)   SpO2 94%  BMI 35.90 kg/m     Physical Exam Constitutional:      General: She is not in acute distress.    Appearance: She is well-developed.  HENT:     Head: Normocephalic and atraumatic.     Right Ear: Hearing normal.     Left Ear: Hearing normal.     Nose: Nose normal.  Eyes:     General: Lids are normal. No scleral icterus.       Right eye: No discharge.        Left eye: No discharge.     Conjunctiva/sclera: Conjunctivae normal.  Cardiovascular:     Rate and Rhythm: Normal rate and regular rhythm.     Pulses: Normal pulses.     Heart sounds: Normal heart sounds.  Pulmonary:     Effort: Pulmonary effort is normal. No respiratory distress.     Breath sounds: Normal breath sounds.  Abdominal:     General: Bowel sounds are normal.     Palpations: Abdomen is soft.  Musculoskeletal:        General: Normal range of motion.     Cervical back: Normal range of motion and neck supple.  Skin:    Findings: No lesion or rash.  Neurological:     Mental Status: She is alert and oriented to person, place, and time.  Psychiatric:        Speech: Speech normal.        Behavior: Behavior normal.        Thought Content: Thought content normal.    Diabetic Foot Form - Detailed   Diabetic Foot Exam - detailed Diabetic Foot exam was performed with the following findings: Yes 10/18/2020 11:39 AM  Visual Foot Exam completed.: Yes  Can the patient see the bottom of their feet?: Yes Are the shoes appropriate in  style and fit?: Yes Is there swelling or and abnormal foot shape?: No Is there a claw toe deformity?: No Is there elevated skin temparature?: No Is there foot or ankle muscle weakness?: No Normal Range of Motion: Yes Pulse Foot Exam completed.: Yes   Right posterior Tibialias: Present Left posterior Tibialias: Present   Right Dorsalis Pedis: Other Left Dorsalis Pedis: Present  Sensory Foot Exam Completed.: Yes Semmes-Weinstein Monofilament Test R Site 1-Great Toe: Pos L Site 1-Great Toe: Pos     Comments: Decreased sensation bottom of both feet. Tender callus on the left foot at the base of the 3rd toe - plantar surface.    No results found for any visits on 10/18/20.  Assessment & Plan     1. Type 2 diabetes mellitus with stage 3 chronic kidney disease, without long-term current use of insulin ([place]) Fair control of diabetes with Hgb a1C 7.1% on 10-06-19 and renal function stable with creatinine 1.46. Need refill of Glipizide and Metformin. Continue diet and recheck labs. - CBC with Differential/Platelet - Comprehensive metabolic panel - Hemoglobin A1c - Lipid Panel With LDL/HDL Ratio - glipiZIDE (GLUCOTROL) 10 MG tablet; Take 1 tablet (10 mg total) by mouth 2 (two) times daily.  Dispense: 180 tablet; Refill: 3 - metFORMIN (GLUCOPHAGE) 500 MG tablet; Take 1 tablet (500 mg total) by mouth 2 (two) times daily with a meal.  Dispense: 180 tablet; Refill: 3  2. Stage 3 chronic kidney disease, unspecified whether stage 3a or 3b CKD ([place]) Due for recheck of labs. 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 - CBC with Differential/Platelet - Comprehensive metabolic panel  3. Essential hypertension Get recheck labs  and refill Coreg. - CBC with Differential/Platelet - Comprehensive metabolic panel - Lipid Panel With LDL/HDL Ratio - TSH - carvedilol (COREG) 12.5 MG tablet; Take 1 tablet (12.5 mg total) by mouth 2 (two) times daily with a meal.  Dispense: 180 tablet; Refill: 3  4.  Gastroesophageal reflux disease without esophagitis Needs refill of Prilosec. Recheck routine labs. - CBC with Differential/Platelet - Comprehensive metabolic panel - omeprazole (PRILOSEC) 40 MG capsule; Take 1 capsule (40 mg total) by mouth daily.  Dispense: 90 capsule; Refill: 1   No follow-ups on file.      I, Sanaai Doane, PA-C, have reviewed all documentation for this visit. The documentation on 10/18/20 for the exam, diagnosis, procedures, and orders are all accurate and complete.    [name], PA-C  [employer] [phone] (phone) [phone] (fax)  [place]

## 2020-10-19 LAB — CBC WITH DIFFERENTIAL/PLATELET
Basophils Absolute: 0 10*3/uL (ref 0.0–0.2)
Basos: 0 %
EOS (ABSOLUTE): 0.1 10*3/uL (ref 0.0–0.4)
Eos: 1 %
Hematocrit: 33.1 % — ABNORMAL LOW (ref 34.0–46.6)
Hemoglobin: 11.1 g/dL (ref 11.1–15.9)
Immature Grans (Abs): 0 10*3/uL (ref 0.0–0.1)
Immature Granulocytes: 0 %
Lymphocytes Absolute: 2.3 10*3/uL (ref 0.7–3.1)
Lymphs: 38 %
MCH: 30.7 pg (ref 26.6–33.0)
MCHC: 33.5 g/dL (ref 31.5–35.7)
MCV: 92 fL (ref 79–97)
Monocytes Absolute: 0.5 10*3/uL (ref 0.1–0.9)
Monocytes: 8 %
Neutrophils Absolute: 3.2 10*3/uL (ref 1.4–7.0)
Neutrophils: 53 %
Platelets: 159 10*3/uL (ref 150–450)
RBC: 3.61 x10E6/uL — ABNORMAL LOW (ref 3.77–5.28)
RDW: 13.6 % (ref 11.7–15.4)
WBC: 6.1 10*3/uL (ref 3.4–10.8)

## 2020-10-19 LAB — COMPREHENSIVE METABOLIC PANEL
ALT: 13 IU/L (ref 0–32)
AST: 16 IU/L (ref 0–40)
Albumin/Globulin Ratio: 1.6 (ref 1.2–2.2)
Albumin: 4.1 g/dL (ref 3.7–4.7)
Alkaline Phosphatase: 70 IU/L (ref 44–121)
BUN/Creatinine Ratio: 14 (ref 12–28)
BUN: 22 mg/dL (ref 8–27)
Bilirubin Total: 0.3 mg/dL (ref 0.0–1.2)
CO2: 18 mmol/L — ABNORMAL LOW (ref 20–29)
Calcium: 8.6 mg/dL — ABNORMAL LOW (ref 8.7–10.3)
Chloride: 106 mmol/L (ref 96–106)
Creatinine, Ser: 1.52 mg/dL — ABNORMAL HIGH (ref 0.57–1.00)
Globulin, Total: 2.5 g/dL (ref 1.5–4.5)
Glucose: 43 mg/dL — ABNORMAL LOW (ref 65–99)
Potassium: 4.3 mmol/L (ref 3.5–5.2)
Sodium: 143 mmol/L (ref 134–144)
Total Protein: 6.6 g/dL (ref 6.0–8.5)
eGFR: 36 mL/min/{1.73_m2} — ABNORMAL LOW (ref >59)

## 2020-10-19 LAB — HEMOGLOBIN A1C
Est. average glucose Bld gHb Est-mCnc: 137 mg/dL
Hgb A1c MFr Bld: 6.4 % — ABNORMAL HIGH (ref 4.8–5.6)

## 2020-10-19 LAB — LIPID PANEL WITH LDL/HDL RATIO
Cholesterol, Total: 139 mg/dL (ref 100–199)
HDL: 53 mg/dL (ref >39)
LDL Chol Calc (NIH): 61 mg/dL (ref 0–99)
LDL/HDL Ratio: 1.2 ratio (ref 0.0–3.2)
Triglycerides: 148 mg/dL (ref 0–149)
VLDL Cholesterol Cal: 25 mg/dL (ref 5–40)

## 2020-10-19 LAB — TSH: TSH: 1.75 u[IU]/mL (ref 0.450–4.500)

## 2020-11-25 ENCOUNTER — Telehealth: Payer: Self-pay

## 2020-11-25 ENCOUNTER — Other Ambulatory Visit: Payer: Self-pay | Admitting: Family Medicine

## 2020-11-25 DIAGNOSIS — Z1231 Encounter for screening mammogram for malignant neoplasm of breast: Secondary | ICD-10-CM

## 2020-11-25 NOTE — Telephone Encounter (Signed)
-----   Message from Sofie Rower, [place] sent at 11/25/2020  2:25 PM EDT ----- Regarding: mammogram order Hello, my name is Bevelyn Buckles and I am your quality coordinator assigned to [facility]. I am reaching out on behalf of Ms. Cynthia Sims. She is due for a mammogram and would like to get that scheduled with [facility]. I contacted them and they said they would need an order from Dr. Natale Milch and to give you this reference number: [id number] once the order is in the patient or I will be able to schedule. Thank you so much for you help!

## 2020-11-25 NOTE — Telephone Encounter (Signed)
Screening mammogram order put in chart.

## 2020-11-26 ENCOUNTER — Other Ambulatory Visit: Payer: Self-pay | Admitting: Family Medicine

## 2020-11-26 DIAGNOSIS — E0842 Diabetes mellitus due to underlying condition with diabetic polyneuropathy: Secondary | ICD-10-CM

## 2020-11-26 DIAGNOSIS — F411 Generalized anxiety disorder: Secondary | ICD-10-CM

## 2020-11-26 NOTE — Telephone Encounter (Signed)
Patient advised to call [facility] to schedule appt.

## 2020-11-30 ENCOUNTER — Ambulatory Visit
Admission: RE | Admit: 2020-11-30 | Discharge: 2020-11-30 | Disposition: A | Discharge status: Home / Self Care | Payer: Medicare HMO | Source: Ambulatory Visit | Attending: Family Medicine | Admitting: Family Medicine

## 2020-11-30 ENCOUNTER — Other Ambulatory Visit: Payer: Self-pay

## 2020-11-30 DIAGNOSIS — Z1231 Encounter for screening mammogram for malignant neoplasm of breast: Secondary | ICD-10-CM | POA: Diagnosis not present

## 2020-12-21 ENCOUNTER — Other Ambulatory Visit: Payer: Self-pay | Admitting: Family Medicine

## 2020-12-21 DIAGNOSIS — E1122 Type 2 diabetes mellitus with diabetic chronic kidney disease: Secondary | ICD-10-CM

## 2020-12-21 DIAGNOSIS — N183 Chronic kidney disease, stage 3 unspecified: Secondary | ICD-10-CM

## 2021-02-10 ENCOUNTER — Other Ambulatory Visit: Payer: Self-pay | Admitting: Family Medicine

## 2021-02-10 DIAGNOSIS — K219 Gastro-esophageal reflux disease without esophagitis: Secondary | ICD-10-CM

## 2021-02-10 NOTE — Telephone Encounter (Signed)
Requested Prescriptions  Pending Prescriptions Disp Refills  . omeprazole (PRILOSEC) 40 MG capsule [Pharmacy Med Name: Omeprazole 40 MG Oral Capsule Delayed Release] 90 capsule 0    Sig: Take 1 capsule by mouth once daily     Gastroenterology: Proton Pump Inhibitors Passed - 02/10/2021  5:30 AM      Passed - Valid encounter within last 12 months    Recent Outpatient Visits          3 months ago Stage 3 chronic kidney disease, unspecified whether stage 3a or 3b CKD ([place])   [place], Vickki Muff, PA-C   1 year ago Essential hypertension   [place], [place], PA-C   1 year ago Diabetes mellitus due to underlying condition with diabetic polyneuropathy, without long-term current use of insulin [facility])   [place], Vickki Muff, PA-C   1 year ago Gallstone pancreatitis   [employer], Vickki Muff, PA-C   2 years ago [address], [place], [place]             . Lancets (ONETOUCH DELICA PLUS [id number]) [place] [Pharmacy Med Name: ONETOUCH DELICA LAN 98Y MIS] 641 each 0    Sig: USE 1  TO CHECK GLUCOSE ONCE DAILY     Endocrinology: Diabetes - Testing Supplies Passed - 02/10/2021  5:30 AM      Passed - Valid encounter within last 12 months    Recent Outpatient Visits          3 months ago Stage 3 chronic kidney disease, unspecified whether stage 3a or 3b CKD ([place])   [place], Vickki Muff, PA-C   1 year ago Essential hypertension   [place], [place], PA-C   1 year ago Diabetes mellitus due to underlying condition with diabetic polyneuropathy, without long-term current use of insulin [facility])   [place], Vickki Muff, PA-C   1 year ago Gallstone pancreatitis   [employer], Vickki Muff, PA-C   2 years ago [place], [place], [place]

## 2021-02-16 DIAGNOSIS — R339 Retention of urine, unspecified: Secondary | ICD-10-CM | POA: Diagnosis not present

## 2021-02-16 DIAGNOSIS — N390 Urinary tract infection, site not specified: Secondary | ICD-10-CM | POA: Diagnosis not present

## 2021-02-16 DIAGNOSIS — N312 Flaccid neuropathic bladder, not elsewhere classified: Secondary | ICD-10-CM | POA: Diagnosis not present

## 2021-02-16 DIAGNOSIS — N319 Neuromuscular dysfunction of bladder, unspecified: Secondary | ICD-10-CM | POA: Diagnosis not present

## 2021-02-17 ENCOUNTER — Other Ambulatory Visit: Payer: Self-pay | Admitting: Family Medicine

## 2021-02-17 DIAGNOSIS — K219 Gastro-esophageal reflux disease without esophagitis: Secondary | ICD-10-CM

## 2021-02-22 ENCOUNTER — Other Ambulatory Visit: Payer: Self-pay | Admitting: Family Medicine

## 2021-02-22 DIAGNOSIS — N183 Chronic kidney disease, stage 3 unspecified: Secondary | ICD-10-CM

## 2021-02-22 DIAGNOSIS — E1122 Type 2 diabetes mellitus with diabetic chronic kidney disease: Secondary | ICD-10-CM

## 2021-02-22 MED ORDER — ONETOUCH DELICA PLUS LANCET33G MISC: 3 refills | Status: DC

## 2021-04-04 ENCOUNTER — Other Ambulatory Visit: Payer: Self-pay

## 2021-04-04 ENCOUNTER — Telehealth: Payer: Self-pay | Admitting: Family Medicine

## 2021-04-04 DIAGNOSIS — K219 Gastro-esophageal reflux disease without esophagitis: Secondary | ICD-10-CM

## 2021-04-04 MED ORDER — OMEPRAZOLE 40 MG PO CPDR: 40.0000 mg | DELAYED_RELEASE_CAPSULE | Freq: Every day | ORAL | 0 refills | Status: DC

## 2021-04-04 NOTE — Progress Notes (Signed)
Done

## 2021-04-04 NOTE — Telephone Encounter (Signed)
[facility] faxed refill request for the following medications:   omeprazole (PRILOSEC) 40 MG capsule    Please advise.

## 2021-04-13 ENCOUNTER — Other Ambulatory Visit: Payer: Self-pay

## 2021-04-13 ENCOUNTER — Ambulatory Visit (INDEPENDENT_AMBULATORY_CARE_PROVIDER_SITE_OTHER): Payer: Medicare HMO | Admitting: Family Medicine

## 2021-04-13 ENCOUNTER — Encounter: Payer: Self-pay | Admitting: Family Medicine

## 2021-04-13 VITALS — BP 138/81 | HR 64 | Resp 18 | Ht 61.0 in | Wt 193.0 lb

## 2021-04-13 DIAGNOSIS — F411 Generalized anxiety disorder: Secondary | ICD-10-CM

## 2021-04-13 DIAGNOSIS — E1122 Type 2 diabetes mellitus with diabetic chronic kidney disease: Secondary | ICD-10-CM | POA: Diagnosis not present

## 2021-04-13 DIAGNOSIS — N183 Chronic kidney disease, stage 3 unspecified: Secondary | ICD-10-CM | POA: Diagnosis not present

## 2021-04-13 DIAGNOSIS — E782 Mixed hyperlipidemia: Secondary | ICD-10-CM | POA: Diagnosis not present

## 2021-04-13 DIAGNOSIS — I10 Essential (primary) hypertension: Secondary | ICD-10-CM | POA: Diagnosis not present

## 2021-04-13 DIAGNOSIS — R69 Illness, unspecified: Secondary | ICD-10-CM | POA: Diagnosis not present

## 2021-04-13 DIAGNOSIS — E0842 Diabetes mellitus due to underlying condition with diabetic polyneuropathy: Secondary | ICD-10-CM

## 2021-04-13 LAB — POCT GLYCOSYLATED HEMOGLOBIN (HGB A1C)
Est. average glucose Bld gHb Est-mCnc: 134
Hemoglobin A1C: 6.3 % — AB (ref 4.0–5.6)

## 2021-04-13 MED ORDER — DULOXETINE HCL 60 MG PO CPEP: ORAL_CAPSULE | ORAL | 1 refills | Status: DC

## 2021-04-13 NOTE — Progress Notes (Signed)
I,April Miller,acting as a scribe for Wilhemena Durie, MD.,have documented all relevant documentation on the behalf of Wilhemena Durie, MD,as directed by  Wilhemena Durie, MD while in the presence of Wilhemena Durie, MD.  Established patient visit   [name]   DOB: Sep 02, 1950   70 y.o. Female  MRN: [id number] Visit Date: 04/13/2021  Today's healthcare provider: Wilhemena Durie, MD   Chief Complaint  Patient presents with   Follow-up   Diabetes   Hypertension   Hyperlipidemia   Subjective    HPI  Patient comes in today for follow-up.  She had the flu shot.  She feels well. She does need refill on her duloxetine.  He is having maybe mild occasional hypoglycemia at 70. Diabetes Mellitus Type II, follow-up  Lab Results  Component Value Date   HGBA1C 6.3 (A) 04/13/2021   HGBA1C 6.4 (H) 10/18/2020   HGBA1C 7.1 (H) 10/06/2019   Last seen for diabetes 6 months ago.  Management since then includes continuing the same treatment. She reports good compliance with treatment. She is not having side effects. none  Home blood sugar records: fasting range: up and down  Episodes of hypoglycemia? No none   Current insulin regiment: n/a Most Recent Eye Exam: 12/09/2018  --------------------------------------------------------------------------------------------------- Hypertension, follow-up  BP Readings from Last 3 Encounters:  04/13/21 138/81  10/18/20 (!) 162/76  12/30/19 136/71   Wt Readings from Last 3 Encounters:  04/13/21 193 lb (87.5 kg)  10/18/20 190 lb (86.2 kg)  12/30/19 191 lb (86.6 kg)     She was last seen for hypertension 6 months ago.  BP at that visit was 162/76. Management since that visit includes; on carvedilol. She reports good compliance with treatment. She is not having side effects. none She is not exercising. She is adherent to low salt diet.   Outside blood pressures are not checking.  She does not  smoke.  Use of agents associated with hypertension: none.   --------------------------------------------------------------------------------------------------- Lipid/Cholesterol, follow-up  Last Lipid Panel: Lab Results  Component Value Date   CHOL 139 10/18/2020   LDLCALC 61 10/18/2020   HDL 53 10/18/2020   TRIG 148 10/18/2020    She was last seen for this 6 months ago.  Management since that visit includes; labs checked showing stable.  She reports good compliance with treatment. She is not having side effects. none  She is following a Regular diet. Current exercise: none  Last metabolic panel Lab Results  Component Value Date   GLUCOSE 43 (L) 10/18/2020   NA 143 10/18/2020   K 4.3 10/18/2020   BUN 22 10/18/2020   CREATININE 1.52 (H) 10/18/2020   EGFR 36 (L) 10/18/2020   GFRNONAA 36 (L) 10/06/2019   CALCIUM 8.6 (L) 10/18/2020   AST 16 10/18/2020   ALT 13 10/18/2020   The 10-year ASCVD risk score (Arnett DK, et al., 2019) is: 29.7%  ---------------------------------------------------------------------------------------------------   She is taking medications as prescribed.  Medications: Outpatient Medications Prior to Visit  Medication Sig   acetaminophen (TYLENOL) 500 MG tablet Take 500 mg by mouth every 6 (six) hours as needed.    aspirin EC 81 MG tablet Take 81 mg by mouth daily.    carvedilol (COREG) 12.5 MG tablet Take 1 tablet (12.5 mg total) by mouth 2 (two) times daily with a meal.   cyanocobalamin (,VITAMIN B-12,) 1000 MCG/ML injection Inject into the muscle daily.   glipiZIDE (GLUCOTROL) 10 MG tablet Take  1 tablet (10 mg total) by mouth 2 (two) times daily.   glucose blood (ONETOUCH ULTRA) test strip 1 each by Other route 3 (three) times daily. Use as instructed   Insulin Glargine (BASAGLAR KWIKPEN) 100 UNIT/ML Inject 0.24 mLs (24 Units total) into the skin daily.   Lancets (ONETOUCH DELICA PLUS [id number]) MISC USE 1  TO CHECK GLUCOSE ONCE DAILY    lovastatin (MEVACOR) 40 MG tablet Take 1 tablet by mouth once daily   metFORMIN (GLUCOPHAGE) 500 MG tablet Take 1 tablet (500 mg total) by mouth 2 (two) times daily with a meal.   omeprazole (PRILOSEC) 40 MG capsule Take 1 capsule (40 mg total) by mouth daily.   PROLENSA 0.07 % SOLN    [DISCONTINUED] DULoxetine (CYMBALTA) 60 MG capsule TAKE 1 CAPSULE BY MOUTH ONCE DAILY -  NEED  APPT  FOR  FURTHER  REFILLS   Cranberry 125 MG TABS Take 1 tablet by mouth daily. (Patient not taking: Reported on 04/13/2021)   ferrous sulfate 325 (65 FE) MG tablet Take 1 tablet by mouth daily. (Patient not taking: Reported on 04/13/2021)   hydrOXYzine (ATARAX/VISTARIL) 10 MG tablet Take 1 tablet (10 mg total) by mouth 3 (three) times daily as needed. (Patient not taking: Reported on 04/13/2021)   No facility-administered medications prior to visit.    Review of Systems  Constitutional:  Negative for appetite change, chills, fatigue and fever.  Respiratory:  Negative for chest tightness and shortness of breath.   Cardiovascular:  Negative for chest pain and palpitations.  Gastrointestinal:  Negative for abdominal pain, nausea and vomiting.  Neurological:  Negative for dizziness and weakness.      Objective    BP 138/81 (BP Location: Right Arm, Patient Position: Sitting, Cuff Size: Large)   Pulse 64   Resp 18   Ht 5' 1"  (1.549 m)   Wt 193 lb (87.5 kg)   SpO2 98%   BMI 36.47 kg/m  {Show previous vital signs (optional):23777}  Physical Exam Vitals reviewed.  Constitutional:      General: She is not in acute distress.    Appearance: She is well-developed.  HENT:     Head: Normocephalic and atraumatic.     Right Ear: Hearing normal.     Left Ear: Hearing normal.     Nose: Nose normal.  Eyes:     General: Lids are normal. No scleral icterus.       Right eye: No discharge.        Left eye: No discharge.     Conjunctiva/sclera: Conjunctivae normal.  Cardiovascular:     Rate and Rhythm: Normal rate and  regular rhythm.     Pulses: Normal pulses.     Heart sounds: Normal heart sounds.  Pulmonary:     Effort: Pulmonary effort is normal. No respiratory distress.     Breath sounds: Normal breath sounds.  Abdominal:     General: Bowel sounds are normal.     Palpations: Abdomen is soft.  Musculoskeletal:     Cervical back: Normal range of motion and neck supple.  Skin:    Findings: No lesion or rash.  Neurological:     Mental Status: She is alert and oriented to person, place, and time.  Psychiatric:        Speech: Speech normal.        Behavior: Behavior normal.        Thought Content: Thought content normal.      Results for orders placed or performed  in visit on 04/13/21  POCT glycosylated hemoglobin (Hb A1C)  Result Value Ref Range   Hemoglobin A1C 6.3 (A) 4.0 - 5.6 %   Est. average glucose Bld gHb Est-mCnc 134     Assessment & Plan     1. Type 2 diabetes mellitus with stage 3 chronic kidney disease, without long-term current use of insulin, unspecified whether stage 3a or 3b CKD (HCC) A1c a little bit too tight at 6.3 so we will decrease insulin from 24 units to 20 units try to get fastings above 120 for the time being.  Also consider decreasing or stopping glipizide in the future. - POCT glycosylated hemoglobin (Hb A1C)--6.3 today.  2. Essential hypertension Good control on Coreg 12.5 twice a day  3. Combined fat and carbohydrate induced hyperlipemia On lovastatin 40  4. Diabetes mellitus due to underlying condition with diabetic polyneuropathy, without long-term current use of insulin (HCC) Duloxetine for symptom control and it is helped - DULoxetine (CYMBALTA) 60 MG capsule; TAKE 1 CAPSULE BY MOUTH ONCE DAILY  Dispense: 90 capsule; Refill: 1  5. Anxiety state Duloxetine should also help anxiety. - DULoxetine (CYMBALTA) 60 MG capsule; TAKE 1 CAPSULE BY MOUTH ONCE DAILY  Dispense: 90 capsule; Refill: 1   Return in about 4 months (around 08/11/2021).      I, Wilhemena Durie, MD, have reviewed all documentation for this visit. The documentation on 04/19/21 for the exam, diagnosis, procedures, and orders are all accurate and complete.    Antionette Luster Cranford Mon, MD  [facility] [phone] (phone) [phone] (fax)  [place]

## 2021-04-13 NOTE — Patient Instructions (Signed)
Decrease Insulin from 24 units to 20 units.

## 2021-04-21 ENCOUNTER — Encounter: Payer: Self-pay | Admitting: Family Medicine

## 2021-04-21 DIAGNOSIS — Z961 Presence of intraocular lens: Secondary | ICD-10-CM | POA: Diagnosis not present

## 2021-04-21 DIAGNOSIS — H35033 Hypertensive retinopathy, bilateral: Secondary | ICD-10-CM | POA: Diagnosis not present

## 2021-04-21 DIAGNOSIS — E119 Type 2 diabetes mellitus without complications: Secondary | ICD-10-CM | POA: Diagnosis not present

## 2021-04-21 DIAGNOSIS — Z7984 Long term (current) use of oral hypoglycemic drugs: Secondary | ICD-10-CM | POA: Diagnosis not present

## 2021-04-21 LAB — HM DIABETES EYE EXAM

## 2021-05-09 ENCOUNTER — Telehealth: Payer: Self-pay | Admitting: Family Medicine

## 2021-05-09 DIAGNOSIS — N183 Chronic kidney disease, stage 3 unspecified: Secondary | ICD-10-CM

## 2021-05-09 DIAGNOSIS — E1122 Type 2 diabetes mellitus with diabetic chronic kidney disease: Secondary | ICD-10-CM

## 2021-05-09 MED ORDER — LOVASTATIN 40 MG PO TABS: 40.0000 mg | ORAL_TABLET | Freq: Every day | ORAL | 0 refills | Status: DC

## 2021-05-09 NOTE — Addendum Note (Signed)
Addended by: Doristine Devoid on: 05/09/2021 04:56 PM   Modules accepted: Orders

## 2021-05-09 NOTE — Telephone Encounter (Signed)
[facility] faxed refill request for the following medications:   lovastatin (MEVACOR) 40 MG tablet   Please advise.

## 2021-05-16 DIAGNOSIS — R339 Retention of urine, unspecified: Secondary | ICD-10-CM | POA: Diagnosis not present

## 2021-05-16 DIAGNOSIS — N390 Urinary tract infection, site not specified: Secondary | ICD-10-CM | POA: Diagnosis not present

## 2021-05-16 DIAGNOSIS — N319 Neuromuscular dysfunction of bladder, unspecified: Secondary | ICD-10-CM | POA: Diagnosis not present

## 2021-05-16 DIAGNOSIS — N312 Flaccid neuropathic bladder, not elsewhere classified: Secondary | ICD-10-CM | POA: Diagnosis not present

## 2021-07-01 ENCOUNTER — Other Ambulatory Visit: Payer: Self-pay | Admitting: Family Medicine

## 2021-07-01 DIAGNOSIS — E0842 Diabetes mellitus due to underlying condition with diabetic polyneuropathy: Secondary | ICD-10-CM

## 2021-07-01 NOTE — Telephone Encounter (Signed)
Copied from [place] [id number]. Topic: Quick Communication - Rx Refill/Question >> Jul 01, 2021  9:27 AM Yvette Rack wrote: Medication: Insulin Glargine (BASAGLAR KWIKPEN) 100 UNIT/ML  Has the patient contacted their pharmacy? No. Rx prescribed by another doctor (Agent: If no, request that the patient contact the pharmacy for the refill. If patient does not wish to contact the pharmacy document the reason why and proceed with request.) (Agent: If yes, when and what did the pharmacy advise?)  Preferred Pharmacy (with phone number or street name): [place], [place] Phone: [phone]  Fax: [phone]  Has the patient been seen for an appointment in the last year OR does the patient have an upcoming appointment? Yes.    Agent: Please be advised that RX refills may take up to 3 business days. We ask that you follow-up with your pharmacy.

## 2021-07-01 NOTE — Telephone Encounter (Signed)
Requested medication (s) are due for refill today - expired Rx  Requested medication (s) are on the active medication list -yes  Future visit scheduled -no  Last refill: 10/06/19 5ml  Notes to clinic: Request RF: expired Rx  Requested Prescriptions  Pending Prescriptions Disp Refills   Insulin Glargine (BASAGLAR KWIKPEN) 100 UNIT/ML 9 mL 0    Sig: Inject 24 Units into the skin daily.     Endocrinology:  Diabetes - Insulins Passed - 07/01/2021 10:37 AM      Passed - HBA1C is between 0 and 7.9 and within 180 days    Hemoglobin A1C  Date Value Ref Range Status  04/13/2021 6.3 (A) 4.0 - 5.6 % Final   Hgb A1c MFr Bld  Date Value Ref Range Status  10/18/2020 6.4 (H) 4.8 - 5.6 % Final    Comment:             Prediabetes: 5.7 - 6.4          Diabetes: >6.4          Glycemic control for adults with diabetes: <7.0           Passed - Valid encounter within last 6 months    Recent Outpatient Visits           2 months ago Type 2 diabetes mellitus with stage 3 chronic kidney disease, without long-term current use of insulin, unspecified whether stage 3a or 3b CKD ([place])   [facility] Jerrol Banana., MD   8 months ago Stage 3 chronic kidney disease, unspecified whether stage 3a or 3b CKD [facility])   [place], Vickki Muff, PA-C   1 year ago Essential hypertension   [place], [place], PA-C   1 year ago Diabetes mellitus due to underlying condition with diabetic polyneuropathy, without long-term current use of insulin [facility])   [place], Vickki Muff, PA-C   2 years ago Gallstone pancreatitis   [employer], Vickki Muff, PA-C                 Requested Prescriptions  Pending Prescriptions Disp Refills   Insulin Glargine (BASAGLAR KWIKPEN) 100 UNIT/ML 9 mL 0    Sig: Inject 24 Units into the skin daily.     Endocrinology:  Diabetes - Insulins Passed - 07/01/2021 10:37 AM       Passed - HBA1C is between 0 and 7.9 and within 180 days    Hemoglobin A1C  Date Value Ref Range Status  04/13/2021 6.3 (A) 4.0 - 5.6 % Final   Hgb A1c MFr Bld  Date Value Ref Range Status  10/18/2020 6.4 (H) 4.8 - 5.6 % Final    Comment:             Prediabetes: 5.7 - 6.4          Diabetes: >6.4          Glycemic control for adults with diabetes: <7.0           Passed - Valid encounter within last 6 months    Recent Outpatient Visits           2 months ago Type 2 diabetes mellitus with stage 3 chronic kidney disease, without long-term current use of insulin, unspecified whether stage 3a or 3b CKD [facility])   [facility] Jerrol Banana., MD   8 months ago Stage 3 chronic kidney disease, unspecified whether stage 3a or  3b CKD [facility])   [employer], Vickki Muff, PA-C   1 year ago Essential hypertension   [place], [place], [place]   1 year ago Diabetes mellitus due to underlying condition with diabetic polyneuropathy, without long-term current use of insulin [facility])   [place], Vickki Muff, [place]   2 years ago Gallstone pancreatitis   [place], [place]

## 2021-07-04 MED ORDER — BASAGLAR KWIKPEN 100 UNIT/ML ~~LOC~~ SOPN: 24.0000 [IU] | PEN_INJECTOR | Freq: Every day | SUBCUTANEOUS | 0 refills | Status: DC

## 2021-08-24 ENCOUNTER — Other Ambulatory Visit: Payer: Self-pay | Admitting: Family Medicine

## 2021-08-24 DIAGNOSIS — N183 Chronic kidney disease, stage 3 unspecified: Secondary | ICD-10-CM

## 2021-08-25 ENCOUNTER — Other Ambulatory Visit: Payer: Self-pay

## 2021-08-25 DIAGNOSIS — I10 Essential (primary) hypertension: Secondary | ICD-10-CM

## 2021-08-25 MED ORDER — CARVEDILOL 12.5 MG PO TABS: 12.5000 mg | ORAL_TABLET | Freq: Two times a day (BID) | ORAL | 0 refills | Status: DC

## 2021-08-25 NOTE — Telephone Encounter (Signed)
Requested medication (s) are due for refill today: yes ? ?Requested medication (s) are on the active medication list: yes ? ?Last refill:  10/18/20 #180/3 ? ?Future visit scheduled: yes ? ?Notes to clinic:  Unable to refill per protocol, last refill by another provider. ? ? ? ?  ?Requested Prescriptions  ?Pending Prescriptions Disp Refills  ? carvedilol (COREG) 12.5 MG tablet 180 tablet 0  ?  Sig: Take 1 tablet (12.5 mg total) by mouth 2 (two) times daily with a meal.  ?  ? Cardiovascular: Beta Blockers 3 Failed - 08/25/2021 11:14 AM  ?  ?  Failed - Cr in normal range and within 360 days  ?  Creatinine  ?Date Value Ref Range Status  ?03/01/2012 1.72 (H) 0.60 - 1.30 mg/dL Final  ? ?Creatinine, Ser  ?Date Value Ref Range Status  ?10/18/2020 1.52 (H) 0.57 - 1.00 mg/dL Final  ? ?Creatinine, POC  ?Date Value Ref Range Status  ?08/02/2017 NA mg/dL Final  ?  ?  ?  ?  Passed - AST in normal range and within 360 days  ?  AST  ?Date Value Ref Range Status  ?10/18/2020 16 0 - 40 IU/L Final  ? ?SGOT(AST)  ?Date Value Ref Range Status  ?03/01/2012 47 (H) 15 - 37 Unit/L Final  ?  ?  ?  ?  Passed - ALT in normal range and within 360 days  ?  ALT  ?Date Value Ref Range Status  ?10/18/2020 13 0 - 32 IU/L Final  ? ?SGPT (ALT)  ?Date Value Ref Range Status  ?03/01/2012 56 12 - 78 U/L Final  ?  ?  ?  ?  Passed - Last BP in normal range  ?  BP Readings from Last 1 Encounters:  ?04/13/21 138/81  ?  ?  ?  ?  Passed - Last Heart Rate in normal range  ?  Pulse Readings from Last 1 Encounters:  ?04/13/21 64  ?  ?  ?  ?  Passed - Valid encounter within last 6 months  ?  Recent Outpatient Visits   ? ?      ? 4 months ago Type 2 diabetes mellitus with stage 3 chronic kidney disease, without long-term current use of insulin, unspecified whether stage 3a or 3b CKD ([place])  ? [facility] Jerrol Banana., MD  ? 10 months ago Stage 3 chronic kidney disease, unspecified whether stage 3a or 3b CKD ([place])  ? [place], PA-C  ? 1 year ago Essential hypertension  ? [place], [place]  ? 1 year ago Diabetes mellitus due to underlying condition with diabetic polyneuropathy, without long-term current use of insulin ([place])  ? [place], PA-C  ? 2 years ago Gallstone pancreatitis  ? [place], PA-C  ? ?  ?  ? ?  ?  ?  ? ?

## 2021-08-25 NOTE — Telephone Encounter (Signed)
Copied from [place] [id number] [phone]. Topic: Quick Communication - Rx Refill/Question ?>> Aug 25, 2021 10:04 AM Tessa Lerner A wrote: ?Medication: carvedilol (COREG) 12.5 MG tablet [[id number]]  ? ?Has the patient contacted their pharmacy? No. ?(Agent: If no, request that the patient contact the pharmacy for the refill. If patient does not wish to contact the pharmacy document the reason why and proceed with request.) ?(Agent: If yes, when and what did the pharmacy advise?) ? ?Preferred Pharmacy (with phone number or street name): [place], [place] ?[address] [place] [address] ?Phone: [phone] Fax: [phone] ?Hours: Not open 24 hours ? ? ?Has the patient been seen for an appointment in the last year OR does the patient have an upcoming appointment? Yes.   ? ?Agent: Please be advised that RX refills may take up to 3 business days. We ask that you follow-up with your pharmacy. ?

## 2021-09-05 ENCOUNTER — Telehealth: Payer: Self-pay | Admitting: Family Medicine

## 2021-09-05 ENCOUNTER — Ambulatory Visit: Payer: Self-pay | Admitting: *Deleted

## 2021-09-05 NOTE — Telephone Encounter (Signed)
Medication Refill - Medication: Cholestyramine 4GM, comes in packets ? ?Has the patient contacted their pharmacy? Yes.   ?(Agent: If no, request that the patient contact the pharmacy for the refill. If patient does not wish to contact the pharmacy document the reason why and proceed with request.) ?(Agent: If yes, when and what did the pharmacy advise?) ? ?Preferred Pharmacy (with phone number or street name):  ?[place], [place]  ?[address] [place] [address]  ?Phone: [phone] Fax: [phone]  ? ?Has the patient been seen for an appointment in the last year OR does the patient have an upcoming appointment? Yes.   ? ?Agent: Please be advised that RX refills may take up to 3 business days. We ask that you follow-up with your pharmacy. ? ?

## 2021-09-05 NOTE — Telephone Encounter (Signed)
Returned pt's call.   Called in for a refill of the Questran 4 gram packets.   Call placed in Nurse Triage queue. ? ? ? ? ? ? ?Reason for Disposition ? [1] Caller has URGENT medicine question about med that PCP or specialist prescribed AND [2] triager unable to answer question ? ?Answer Assessment - Initial Assessment Questions ?1. NAME of MEDICATION: "What medicine are you calling about?" ?    Questran 4 gram packets ?2. QUESTION: "What is your question?" (e.g., double dose of medicine, side effect) ?    Would Dr. Rosanna Randy be willing to prescribe this for me?   Dr. Delray Alt is [facility] has been prescribing it for me for chronic diarrhea but I'm no longer seeing him.    I don't want to run out.   ?3. PRESCRIBING HCP: "Who prescribed it?" Reason: if prescribed by specialist, call should be referred to that group. ?    Was Dr. Delray Alt in [facility] but requesting Dr. Rosanna Randy to prescribe it now. ?4. SYMPTOMS: "Do you have any symptoms?" ?    Chronic diarrhea ?5. SEVERITY: If symptoms are present, ask "Are they mild, moderate or severe?" ?    N/A ?6. PREGNANCY:  "Is there any chance that you are pregnant?" "When was your last menstrual period?" ?    N/A ? ?Protocols used: Medication Question Call-A-AH ? ?

## 2021-09-06 NOTE — Telephone Encounter (Signed)
See triage encounter from 09/05/21.  ?

## 2021-09-09 NOTE — Telephone Encounter (Signed)
Pt wanted to know if Dr Rosanna Randy would be willing to send this in, Rx is not on her med list, please advise.  ?

## 2021-09-09 NOTE — Telephone Encounter (Signed)
Patient of Dr. Marlan Palau please review. Contacted patient to get clarification, she states that she was originally prescribed Cholestyramine by Dr. Delray Alt at [facility] and states that she does not see that doctor anymore and would like for PCP to fill prescription.Patient states that she takes medication for diarrhea, I advised Dr. Rosanna Randy will be out of office patient states that this [place] an urgent request and ask that prescription be sent to [place].Cynthia Sims ?

## 2021-09-14 ENCOUNTER — Other Ambulatory Visit: Payer: Self-pay | Admitting: Physician Assistant

## 2021-09-14 DIAGNOSIS — R197 Diarrhea, unspecified: Secondary | ICD-10-CM

## 2021-09-14 MED ORDER — CHOLESTYRAMINE 4 G PO PACK: 1.0000 | PACK | Freq: Three times a day (TID) | ORAL | 0 refills | Status: DC | PRN

## 2021-09-22 ENCOUNTER — Encounter: Payer: Self-pay | Admitting: Family Medicine

## 2021-09-22 ENCOUNTER — Ambulatory Visit (INDEPENDENT_AMBULATORY_CARE_PROVIDER_SITE_OTHER): Payer: Medicare HMO | Admitting: Family Medicine

## 2021-09-22 VITALS — BP 146/70 | HR 65 | Resp 16 | Ht 61.0 in | Wt 188.0 lb

## 2021-09-22 DIAGNOSIS — R399 Unspecified symptoms and signs involving the genitourinary system: Secondary | ICD-10-CM

## 2021-09-22 DIAGNOSIS — Z0000 Encounter for general adult medical examination without abnormal findings: Secondary | ICD-10-CM

## 2021-09-22 DIAGNOSIS — E782 Mixed hyperlipidemia: Secondary | ICD-10-CM | POA: Diagnosis not present

## 2021-09-22 DIAGNOSIS — I10 Essential (primary) hypertension: Secondary | ICD-10-CM

## 2021-09-22 DIAGNOSIS — E0842 Diabetes mellitus due to underlying condition with diabetic polyneuropathy: Secondary | ICD-10-CM | POA: Diagnosis not present

## 2021-09-22 DIAGNOSIS — N183 Chronic kidney disease, stage 3 unspecified: Secondary | ICD-10-CM | POA: Diagnosis not present

## 2021-09-22 DIAGNOSIS — E1122 Type 2 diabetes mellitus with diabetic chronic kidney disease: Secondary | ICD-10-CM | POA: Diagnosis not present

## 2021-09-22 MED ORDER — BASAGLAR KWIKPEN 100 UNIT/ML ~~LOC~~ SOPN: 24.0000 [IU] | PEN_INJECTOR | Freq: Every day | SUBCUTANEOUS | 0 refills | Status: DC

## 2021-09-22 MED ORDER — LOSARTAN POTASSIUM 25 MG PO TABS: 25.0000 mg | ORAL_TABLET | Freq: Every day | ORAL | 1 refills | Status: DC

## 2021-09-22 NOTE — Progress Notes (Signed)
 Annual Wellness Visit  I,April Miller,acting as a scribe for Megan Mans, MD.,have documented all relevant documentation on the behalf of Megan Mans, MD,as directed by  Megan Mans, MD while in the presence of Megan Mans, MD.     [name], Female    DOB: 06-28-1950, 71 y.o.   MRN: [id number] Visit Date: 09/22/2021  Today's Provider: Megan Mans, MD   Chief Complaint  Patient presents with   Medicare Wellness   Subjective    Cynthia Sims is a 71 y.o. female who presents today for her Annual Wellness Visit.Annual Physical. She reports consuming a general and low sodium diet. Home exercise routine includes exercise bike. She generally feels well. She reports sleeping fairly well. She does not have additional problems to discuss today.   HPI Patient has had type 2 diabetes for about 30 years with neuropathy left greater than right. She has to self catheterize her bladder 3 times a day for bladder dysfunction.   Medications: Outpatient Medications Prior to Visit  Medication Sig   acetaminophen (TYLENOL) 500 MG tablet Take 500 mg by mouth every 6 (six) hours as needed.    aspirin EC 81 MG tablet Take 81 mg by mouth daily.    carvedilol (COREG) 12.5 MG tablet Take 1 tablet (12.5 mg total) by mouth 2 (two) times daily with a meal.   cholestyramine (QUESTRAN) 4 g packet Take 1 packet by mouth 3 (three) times daily as needed.   cyanocobalamin (,VITAMIN B-12,) 1000 MCG/ML injection Inject into the muscle daily.   DULoxetine (CYMBALTA) 60 MG capsule TAKE 1 CAPSULE BY MOUTH ONCE DAILY   glipiZIDE (GLUCOTROL) 10 MG tablet Take 1 tablet (10 mg total) by mouth 2 (two) times daily.   glucose blood (ONETOUCH ULTRA) test strip 1 each by Other route 3 (three) times daily. Use as instructed   Lancets (ONETOUCH DELICA PLUS LANCET33G) MISC USE 1  TO CHECK GLUCOSE ONCE DAILY   lovastatin (MEVACOR) 40 MG tablet Take 1 tablet by mouth once  daily   metFORMIN (GLUCOPHAGE) 500 MG tablet Take 1 tablet (500 mg total) by mouth 2 (two) times daily with a meal.   omeprazole (PRILOSEC) 40 MG capsule Take 1 capsule (40 mg total) by mouth daily.   [DISCONTINUED] Insulin Glargine (BASAGLAR KWIKPEN) 100 UNIT/ML Inject 24 Units into the skin daily.   [DISCONTINUED] Cranberry 125 MG TABS Take 1 tablet by mouth daily. (Patient not taking: Reported on 04/13/2021)   [DISCONTINUED] ferrous sulfate 325 (65 FE) MG tablet Take 1 tablet by mouth daily. (Patient not taking: Reported on 04/13/2021)   [DISCONTINUED] hydrOXYzine (ATARAX/VISTARIL) 10 MG tablet Take 1 tablet (10 mg total) by mouth 3 (three) times daily as needed. (Patient not taking: Reported on 04/13/2021)   [DISCONTINUED] PROLENSA 0.07 % SOLN  (Patient not taking: Reported on 09/22/2021)   No facility-administered medications prior to visit.    No Known Allergies  Patient Care Team: Maple Hudson., MD as PCP - General (Family Medicine) Linus Galas, DPM as Consulting Physician (Podiatry) Alfredo Martinez, MD as Consulting Physician (Urology) Santa Genera, MD as Referring Physician (Internal Medicine)  Review of Systems  HENT:  Positive for mouth sores.   Cardiovascular:  Positive for leg swelling.  Gastrointestinal:  Positive for abdominal pain and diarrhea.  Endocrine: Positive for polydipsia.  Genitourinary:  Positive for difficulty urinating.  Musculoskeletal:  Positive for arthralgias.  Neurological:  Positive for numbness.  All other  systems reviewed and are negative.  Last hemoglobin A1c Lab Results  Component Value Date   HGBA1C 6.6 (H) 09/22/2021        Objective    Vitals: BP (!) 146/70 (BP Location: Left Arm, Patient Position: Sitting, Cuff Size: Large)   Pulse 65   Resp 16   Ht 5\' 1"  (1.549 m)   Wt 188 lb (85.3 kg)   SpO2 100%   BMI 35.52 kg/m  BP Readings from Last 3 Encounters:  09/22/21 (!) 146/70  04/13/21 138/81  10/18/20 (!) 162/76   Wt  Readings from Last 3 Encounters:  09/22/21 188 lb (85.3 kg)  04/13/21 193 lb (87.5 kg)  10/18/20 190 lb (86.2 kg)       Physical Exam Vitals reviewed.  Constitutional:      Appearance: Normal appearance.  HENT:     Head: Normocephalic and atraumatic.     Right Ear: Tympanic membrane, ear canal and external ear normal.     Left Ear: Tympanic membrane, ear canal and external ear normal.     Nose: Nose normal.     Mouth/Throat:     Mouth: Mucous membranes are dry.  Eyes:     Extraocular Movements: Extraocular movements intact.     Conjunctiva/sclera: Conjunctivae normal.     Pupils: Pupils are equal, round, and reactive to light.  Neck:     Vascular: No carotid bruit.  Cardiovascular:     Rate and Rhythm: Normal rate and regular rhythm.     Pulses: Normal pulses.     Heart sounds: Normal heart sounds.     Comments: Pedal pulse faint but equal.  Varicose veins bilat worse on right [place]ouses on right Pulmonary:     Effort: Pulmonary effort is normal.     Breath sounds: Normal breath sounds.  Abdominal:     General: Abdomen is flat. Bowel sounds are normal.     Palpations: Abdomen is soft.     Tenderness: There is no right CVA tenderness or left CVA tenderness.  Musculoskeletal:     Cervical back: Normal range of motion and neck supple. No rigidity.     Right lower leg: No edema.     Left lower leg: No edema.  Lymphadenopathy:     Cervical: No cervical adenopathy.  Skin:    General: Skin is warm and dry.     Comments: Decreased sensation to monofilament in the toes of both feet  Neurological:     Mental Status: She is alert.  Psychiatric:        Mood and Affect: Mood normal.        Behavior: Behavior normal.        Thought Content: Thought content normal.        Judgment: Judgment normal.     Most recent functional status assessment:    04/13/2021    9:03 AM  In your present state of health, do you have any difficulty performing the following activities:   Hearing? 0  Vision? 0  Difficulty concentrating or making decisions? 0  Walking or climbing stairs? 1  Dressing or bathing? 0  Doing errands, shopping? 0   Most recent fall risk assessment:    04/13/2021    9:02 AM  Fall Risk   Falls in the past year? 1  Number falls in past yr: 0  Injury with Fall? 0  Risk for fall due to : History of fall(s);Impaired balance/gait  Follow up Falls evaluation completed    Most  recent depression screenings:    04/13/2021    9:03 AM 10/18/2020    9:47 AM  PHQ 2/9 Scores  PHQ - 2 Score 3 0  PHQ- 9 Score 7 6   Most recent cognitive screening:    08/22/2018   10:36 AM  6CIT Screen  What Year? 0 points  What month? 0 points  What time? 0 points  Count back from 20 0 points  Months in reverse 0 points  Repeat phrase 0 points  Total Score 0 points   Most recent Audit-C alcohol use screening    04/13/2021    9:01 AM  Alcohol Use Disorder Test (AUDIT)  1. How often do you have a drink containing alcohol? 0  2. How many drinks containing alcohol do you have on a typical day when you are drinking? 0  3. How often do you have six or more drinks on one occasion? 0  AUDIT-C Score 0   A score of 3 or more in women, and 4 or more in men indicates increased risk for alcohol abuse, EXCEPT if all of the points are from question 1   No results found for any visits on 09/22/21.  Assessment & Plan     Annual wellness visit done today including the all of the following: Reviewed patient's Family Medical History Reviewed and updated list of patient's medical providers Assessment of cognitive impairment was done Assessed patient's functional ability Established a written schedule for health screening services Health Risk Assessent Completed and Reviewed  Exercise Activities and Dietary recommendations  Goals      DIET - REDUCE SUGAR INTAKE     Recommend monitoring amount of sugar consumed and avoiding sweets as much as possible.      Exercise  3x per week (30 min per time)     Recommend to exercise for 3 days a week for at least 30 minutes at a time.      LIFESTYLE - DECREASE FALLS RISK     Recommend to remove any items from the home that may cause slips or trips.        Immunization History  Administered Date(s) Administered   Fluad Quad(high Dose 65+) 03/30/2021   Influenza, High Dose Seasonal PF 02/26/2018   Influenza,inj,Quad PF,6+ Mos 03/13/2019   Influenza,inj,quad, With Preservative 04/09/2020   Influenza-Unspecified 03/16/2014, 03/24/2016, 03/12/2017   PFIZER(Purple Top)SARS-COV-2 Vaccination 09/15/2019, 10/06/2019, 04/09/2020   Pfizer Covid-19 Vaccine Bivalent Booster 57yrs & up 07/01/2021   Pneumococcal Conjugate-13 09/25/2014   Pneumococcal Polysaccharide-23 11/16/2015, 02/15/2019   Tdap 09/25/2014   Zoster Recombinat (Shingrix) 07/01/2021    Health Maintenance  Topic Date Due   URINE MICROALBUMIN  08/09/2019   Zoster Vaccines- Shingrix (2 of 2) 08/26/2021   HEMOGLOBIN A1C  10/11/2021   FOOT EXAM  10/18/2021   INFLUENZA VACCINE  01/10/2022   OPHTHALMOLOGY EXAM  04/21/2022   COLONOSCOPY (Pts 45-57yrs Insurance coverage will need to be confirmed)  11/08/2022   MAMMOGRAM  12/01/2022   TETANUS/TDAP  09/24/2024   Pneumonia Vaccine 34+ Years old  Completed   DEXA SCAN  Completed   COVID-19 Vaccine  Completed   Hepatitis C Screening  Completed   HPV VACCINES  Aged Out     Discussed health benefits of physical activity, and encouraged her to engage in regular exercise appropriate for her age and condition.     1. Encounter for Medicare annual wellness exam  - Lipid panel - TSH - CBC w/Diff/Platelet - Comprehensive Metabolic Panel (CMET) -  Hemoglobin A1c  2. Annual physical exam Time for BMD  3. Type 2 diabetes mellitus with stage 3 chronic kidney disease, without long-term current use of insulin, unspecified whether stage 3a or 3b CKD (HCC)  - Lipid panel - TSH - CBC w/Diff/Platelet -  Comprehensive Metabolic Panel (CMET) - Hemoglobin A1c  4. Essential hypertension  - Lipid panel - TSH - CBC w/Diff/Platelet - Comprehensive Metabolic Panel (CMET) - Hemoglobin A1c - losartan (COZAAR) 25 MG tablet; Take 1 tablet (25 mg total) by mouth daily.  Dispense: 90 tablet; Refill: 1  5. Combined fat and carbohydrate induced hyperlipemia  - Lipid panel - TSH - CBC w/Diff/Platelet - Comprehensive Metabolic Panel (CMET) - Hemoglobin A1c  6. Stage 3 chronic kidney disease, unspecified whether stage 3a or 3b CKD (HCC)  - Lipid panel - TSH - CBC w/Diff/Platelet - Comprehensive Metabolic Panel (CMET) - Hemoglobin A1c  7. Diabetes mellitus due to underlying condition with diabetic polyneuropathy, without long-term current use of insulin (HCC)  - Insulin Glargine (BASAGLAR KWIKPEN) 100 UNIT/ML; Inject 24 Units into the skin daily.  Dispense: 9 mL; Refill: 0  8. Urinary tract infection symptoms With bladder dysfunction. Patient self caths 3 times per day.   Return in about 4 months (around 01/22/2022).     I, Megan Mans, MD, have reviewed all documentation for this visit. The documentation on 09/30/21 for the exam, diagnosis, procedures, and orders are all accurate and complete.    Acy Orsak Wendelyn Breslow, MD  [facility] [phone] (phone) [phone] (fax)  [facility]

## 2021-09-23 LAB — COMPREHENSIVE METABOLIC PANEL
ALT: 16 IU/L (ref 0–32)
AST: 24 IU/L (ref 0–40)
Albumin/Globulin Ratio: 1.7 (ref 1.2–2.2)
Albumin: 4.3 g/dL (ref 3.7–4.7)
Alkaline Phosphatase: 76 IU/L (ref 44–121)
BUN/Creatinine Ratio: 16 (ref 12–28)
BUN: 20 mg/dL (ref 8–27)
Bilirubin Total: 0.3 mg/dL (ref 0.0–1.2)
CO2: 20 mmol/L (ref 20–29)
Calcium: 8.8 mg/dL (ref 8.7–10.3)
Chloride: 106 mmol/L (ref 96–106)
Creatinine, Ser: 1.29 mg/dL — ABNORMAL HIGH (ref 0.57–1.00)
Globulin, Total: 2.5 g/dL (ref 1.5–4.5)
Glucose: 66 mg/dL — ABNORMAL LOW (ref 70–99)
Potassium: 3.9 mmol/L (ref 3.5–5.2)
Sodium: 141 mmol/L (ref 134–144)
Total Protein: 6.8 g/dL (ref 6.0–8.5)
eGFR: 44 mL/min/{1.73_m2} — ABNORMAL LOW (ref >59)

## 2021-09-23 LAB — CBC WITH DIFFERENTIAL/PLATELET
Basophils Absolute: 0 10*3/uL (ref 0.0–0.2)
Basos: 0 %
EOS (ABSOLUTE): 0.1 10*3/uL (ref 0.0–0.4)
Eos: 1 %
Hematocrit: 35.6 % (ref 34.0–46.6)
Hemoglobin: 11.9 g/dL (ref 11.1–15.9)
Immature Grans (Abs): 0 10*3/uL (ref 0.0–0.1)
Immature Granulocytes: 0 %
Lymphocytes Absolute: 2.1 10*3/uL (ref 0.7–3.1)
Lymphs: 34 %
MCH: 29.9 pg (ref 26.6–33.0)
MCHC: 33.4 g/dL (ref 31.5–35.7)
MCV: 89 fL (ref 79–97)
Monocytes Absolute: 0.4 10*3/uL (ref 0.1–0.9)
Monocytes: 6 %
Neutrophils Absolute: 3.6 10*3/uL (ref 1.4–7.0)
Neutrophils: 59 %
Platelets: 164 10*3/uL (ref 150–450)
RBC: 3.98 x10E6/uL (ref 3.77–5.28)
RDW: 14.2 % (ref 11.7–15.4)
WBC: 6.1 10*3/uL (ref 3.4–10.8)

## 2021-09-23 LAB — LIPID PANEL
Chol/HDL Ratio: 2.5 ratio (ref 0.0–4.4)
Cholesterol, Total: 153 mg/dL (ref 100–199)
HDL: 62 mg/dL (ref >39)
LDL Chol Calc (NIH): 63 mg/dL (ref 0–99)
Triglycerides: 165 mg/dL — ABNORMAL HIGH (ref 0–149)
VLDL Cholesterol Cal: 28 mg/dL (ref 5–40)

## 2021-09-23 LAB — TSH: TSH: 2.08 u[IU]/mL (ref 0.450–4.500)

## 2021-09-23 LAB — HEMOGLOBIN A1C
Est. average glucose Bld gHb Est-mCnc: 143 mg/dL
Hgb A1c MFr Bld: 6.6 % — ABNORMAL HIGH (ref 4.8–5.6)

## 2021-09-27 ENCOUNTER — Other Ambulatory Visit: Payer: Self-pay | Admitting: Family Medicine

## 2021-09-27 DIAGNOSIS — N183 Chronic kidney disease, stage 3 unspecified: Secondary | ICD-10-CM

## 2021-09-27 DIAGNOSIS — E1122 Type 2 diabetes mellitus with diabetic chronic kidney disease: Secondary | ICD-10-CM

## 2021-09-27 MED ORDER — METFORMIN HCL 500 MG PO TABS: 500.0000 mg | ORAL_TABLET | Freq: Two times a day (BID) | ORAL | 3 refills | Status: DC

## 2021-09-27 MED ORDER — GLIPIZIDE 10 MG PO TABS: 10.0000 mg | ORAL_TABLET | Freq: Two times a day (BID) | ORAL | 3 refills | Status: DC

## 2021-09-27 NOTE — Telephone Encounter (Signed)
Medication Refill - Medication: glipiZIDE (GLUCOTROL) 10 MG tablet ? ?metFORMIN (GLUCOPHAGE) 500 MG tablet  ? ?Has the patient contacted their pharmacy? Yes.   ?(Agent: If no, request that the patient contact the pharmacy for the refill. If patient does not wish to contact the pharmacy document the reason why and proceed with request.) ?(Agent: If yes, when and what did the pharmacy advise?) ? ?Preferred Pharmacy (with phone number or street name):  ?[place], [place] - [place]  ?[address] [place] [address]  ?Phone: [phone] Fax: [phone]  ? ?Has the patient been seen for an appointment in the last year OR does the patient have an upcoming appointment? Yes.   ? ?Agent: Please be advised that RX refills may take up to 3 business days. We ask that you follow-up with your pharmacy. ? ?

## 2021-09-27 NOTE — Telephone Encounter (Signed)
Requested medication (s) are due for refill today: yes ? ?Requested medication (s) are on the active medication list: yes ? ?Last refill:  10/18/20 #180 3 refills - glucotrol, metformin ? ?Future visit scheduled: yes in 4 months ? ?Notes to clinic:  last ordered by D. Chrismon, [place] do you want to refill Rx? ? ? ?  ?Requested Prescriptions  ?Pending Prescriptions Disp Refills  ? glipiZIDE (GLUCOTROL) 10 MG tablet 180 tablet 3  ?  Sig: Take 1 tablet (10 mg total) by mouth 2 (two) times daily.  ?  ? Endocrinology:  Diabetes - Sulfonylureas Failed - 09/27/2021  3:04 PM  ?  ?  Failed - Cr in normal range and within 360 days  ?  Creatinine  ?Date Value Ref Range Status  ?03/01/2012 1.72 (H) 0.60 - 1.30 mg/dL Final  ? ?Creatinine, Ser  ?Date Value Ref Range Status  ?09/22/2021 1.29 (H) 0.57 - 1.00 mg/dL Final  ? ?Creatinine, POC  ?Date Value Ref Range Status  ?08/02/2017 NA mg/dL Final  ?  ?  ?  ?  Passed - HBA1C is between 0 and 7.9 and within 180 days  ?  Hgb A1c MFr Bld  ?Date Value Ref Range Status  ?09/22/2021 6.6 (H) 4.8 - 5.6 % Final  ?  Comment:  ?           Prediabetes: 5.7 - 6.4 ?         Diabetes: >6.4 ?         Glycemic control for adults with diabetes: <7.0 ?  ?  ?  ?  ?  Passed - Valid encounter within last 6 months  ?  Recent Outpatient Visits   ? ?      ? 5 days ago Encounter for [employer] annual wellness exam  ? [facility] Jerrol Banana., MD  ? 5 months ago Type 2 diabetes mellitus with stage 3 chronic kidney disease, without long-term current use of insulin, unspecified whether stage 3a or 3b CKD ([place])  ? [facility] Jerrol Banana., MD  ? 11 months ago Stage 3 chronic kidney disease, unspecified whether stage 3a or 3b CKD ([place])  ? [place], PA-C  ? 1 year ago Essential hypertension  ? [place], [place]  ? 1 year ago Diabetes mellitus due to underlying condition with diabetic polyneuropathy,  without long-term current use of insulin ([place])  ? [place], PA-C  ? ?  ?  ?Future Appointments   ? ?        ? In 4 months Jerrol Banana., MD [facility], [facility]  ? ?  ? ? ?  ?  ?  ? metFORMIN (GLUCOPHAGE) 500 MG tablet 180 tablet 3  ?  Sig: Take 1 tablet (500 mg total) by mouth 2 (two) times daily with a meal.  ?  ? Endocrinology:  Diabetes - Biguanides Failed - 09/27/2021  3:04 PM  ?  ?  Failed - Cr in normal range and within 360 days  ?  Creatinine  ?Date Value Ref Range Status  ?03/01/2012 1.72 (H) 0.60 - 1.30 mg/dL Final  ? ?Creatinine, Ser  ?Date Value Ref Range Status  ?09/22/2021 1.29 (H) 0.57 - 1.00 mg/dL Final  ? ?Creatinine, POC  ?Date Value Ref Range Status  ?08/02/2017 NA mg/dL Final  ?  ?  ?  ?  Failed - eGFR in  normal range and within 360 days  ?  EGFR (African American)  ?Date Value Ref Range Status  ?03/01/2012 36 (L)  Final  ? ?GFR calc Af Amer  ?Date Value Ref Range Status  ?10/06/2019 41 (L) >59 mL/min/1.73 Final  ?  Comment:  ?  **[facility] currently reports eGFR in compliance with the current** ?  recommendations of the [employer]. [facility] will ?  update reporting as new guidelines are published from the NKF-ASN ?  Task force. ?  ? ?EGFR (Non-African Amer.)  ?Date Value Ref Range Status  ?03/01/2012 31 (L)  Final  ?  Comment:  ?  eGFR values <11m/min/1.73 m2 may be an indication of chronic ?kidney disease (CKD). ?Calculated eGFR is useful in patients with stable renal function. ?The eGFR calculation will not be reliable in acutely ill patients ?when serum creatinine is changing rapidly. It is not useful in  ?patients on dialysis. The eGFR calculation may not be applicable ?to patients at the low and high extremes of body sizes, pregnant ?women, and vegetarians. ?  ? ?GFR calc non Af Amer  ?Date Value Ref Range Status  ?10/06/2019 36 (L) >59 mL/min/1.73 Final  ? ?eGFR  ?Date Value Ref Range Status  ?09/22/2021 44 (L) >59  mL/min/1.73 Final  ?  ?  ?  ?  Failed - B12 Level in normal range and within 720 days  ?  No results found for: VITAMINB12  ?  ?  ?  Passed - HBA1C is between 0 and 7.9 and within 180 days  ?  Hgb A1c MFr Bld  ?Date Value Ref Range Status  ?09/22/2021 6.6 (H) 4.8 - 5.6 % Final  ?  Comment:  ?           Prediabetes: 5.7 - 6.4 ?         Diabetes: >6.4 ?         Glycemic control for adults with diabetes: <7.0 ?  ?  ?  ?  ?  Passed - Valid encounter within last 6 months  ?  Recent Outpatient Visits   ? ?      ? 5 days ago Encounter for [employer] wellness exam  ? [facility]  ? 5 months ago Type 2 diabetes mellitus with stage 3 chronic kidney disease, without long-term current use of insulin, unspecified whether stage 3a or 3b CKD ([place]  ? [facility]  ? 11 months ago Stage 3 chronic kidney disease, unspecified whether stage 3a or 3b CKD ([place]  ? [place] PA-C  ? 1 year ago Essential hypertension  ? [place] ? 1 year ago Diabetes mellitus due to underlying condition with diabetic polyneuropathy, without long-term current use of insulin ([place]  ? [place] PA-C  ? ?  ?  ?Future Appointments   ? ?        ? In 4 months [facility]Jerrol Banana, MD [facility]  ? ?  ? ? ?  ?  ?  Passed - CBC within normal limits and completed in the last 12 months  ?  WBC  ?Date Value Ref Range Status  ?09/22/2021 6.1 3.4 - 10.8 x10E3/uL Final  ?03/01/2012 14.5 (H) 3.6 - 11.0 x10 3/mm 3 Final  ? ?RBC  ?Date Value Ref Range Status  ?09/22/2021 3.98 3.77 -  5.28 x10E6/uL Final  ?03/01/2012 3.83 3.80 - 5.20 X10 6/mm 3 Final  ? ?Hemoglobin  ?Date Value Ref Range Status  ?09/22/2021 11.9 11.1 - 15.9 g/dL Final  ? ?Hematocrit  ?Date Value Ref Range Status  ?09/22/2021 35.6 34.0 - 46.6 % Final  ? ?MCHC  ?Date Value Ref Range  Status  ?09/22/2021 33.4 31.5 - 35.7 g/dL Final  ?03/01/2012 34.0 32.0 - 36.0 g/dL Final  ? ?MCH  ?Date Value Ref Range Status  ?09/22/2021 29.9 26.6 - 33.0 pg Final  ?03/01/2012 31.4 26.0 - 34.0 pg Final  ? ?MCV  ?Date Value Ref Range Status  ?09/22/2021 89 79 - 97 fL Final  ?03/01/2012 92 80 - 100 fL Final  ? ?No results found for: PLTCOUNTKUC, LABPLAT, [place] ?RDW  ?Date Value Ref Range Status  ?09/22/2021 14.2 11.7 - 15.4 % Final  ?03/01/2012 14.9 (H) 11.5 - 14.5 % Final  ? ?  ?  ?  ? ?

## 2021-11-01 ENCOUNTER — Other Ambulatory Visit: Payer: Self-pay | Admitting: Family Medicine

## 2021-11-01 DIAGNOSIS — F411 Generalized anxiety disorder: Secondary | ICD-10-CM

## 2021-11-01 DIAGNOSIS — E0842 Diabetes mellitus due to underlying condition with diabetic polyneuropathy: Secondary | ICD-10-CM

## 2021-11-02 DIAGNOSIS — K219 Gastro-esophageal reflux disease without esophagitis: Secondary | ICD-10-CM | POA: Diagnosis not present

## 2021-11-02 DIAGNOSIS — E1140 Type 2 diabetes mellitus with diabetic neuropathy, unspecified: Secondary | ICD-10-CM | POA: Diagnosis not present

## 2021-11-02 DIAGNOSIS — M1990 Unspecified osteoarthritis, unspecified site: Secondary | ICD-10-CM | POA: Diagnosis not present

## 2021-11-02 DIAGNOSIS — R32 Unspecified urinary incontinence: Secondary | ICD-10-CM | POA: Diagnosis not present

## 2021-11-02 DIAGNOSIS — E669 Obesity, unspecified: Secondary | ICD-10-CM | POA: Diagnosis not present

## 2021-11-02 DIAGNOSIS — R69 Illness, unspecified: Secondary | ICD-10-CM | POA: Diagnosis not present

## 2021-11-02 DIAGNOSIS — I2510 Atherosclerotic heart disease of native coronary artery without angina pectoris: Secondary | ICD-10-CM | POA: Diagnosis not present

## 2021-11-02 DIAGNOSIS — I10 Essential (primary) hypertension: Secondary | ICD-10-CM | POA: Diagnosis not present

## 2021-11-02 DIAGNOSIS — K580 Irritable bowel syndrome with diarrhea: Secondary | ICD-10-CM | POA: Diagnosis not present

## 2021-11-02 DIAGNOSIS — F411 Generalized anxiety disorder: Secondary | ICD-10-CM | POA: Diagnosis not present

## 2021-11-02 DIAGNOSIS — E785 Hyperlipidemia, unspecified: Secondary | ICD-10-CM | POA: Diagnosis not present

## 2021-11-02 DIAGNOSIS — Z794 Long term (current) use of insulin: Secondary | ICD-10-CM | POA: Diagnosis not present

## 2021-11-02 NOTE — Telephone Encounter (Signed)
Requested Prescriptions  Pending Prescriptions Disp Refills  . DULoxetine (CYMBALTA) 60 MG capsule [Pharmacy Med Name: DULoxetine HCl 60 MG Oral Capsule Delayed Release Particles] 90 capsule 0    Sig: Take 1 capsule by mouth once daily     Psychiatry: Antidepressants - SNRI - duloxetine Failed - 11/01/2021  7:04 AM      Failed - Cr in normal range and within 360 days    Creatinine  Date Value Ref Range Status  03/01/2012 1.72 (H) 0.60 - 1.30 mg/dL Final   Creatinine, Ser  Date Value Ref Range Status  09/22/2021 1.29 (H) 0.57 - 1.00 mg/dL Final   Creatinine, POC  Date Value Ref Range Status  08/02/2017 NA mg/dL Final         Failed - Last BP in normal range    BP Readings from Last 1 Encounters:  09/22/21 (!) 146/70         Passed - eGFR is 30 or above and within 360 days    EGFR (African American)  Date Value Ref Range Status  03/01/2012 36 (L)  Final   GFR calc Af Amer  Date Value Ref Range Status  10/06/2019 41 (L) >59 mL/min/1.73 Final    Comment:    **[facility] currently reports eGFR in compliance with the current**   recommendations of the [employer]. [facility] will   update reporting as new guidelines are published from the NKF-ASN   Task force.    EGFR (Non-African Amer.)  Date Value Ref Range Status  03/01/2012 31 (L)  Final    Comment:    eGFR values <67m/min/1.73 m2 may be an indication of chronic kidney disease (CKD). Calculated eGFR is useful in patients with stable renal function. The eGFR calculation will not be reliable in acutely ill patients when serum creatinine is changing rapidly. It is not useful in  patients on dialysis. The eGFR calculation may not be applicable to patients at the low and high extremes of body sizes, pregnant women, and vegetarians.    GFR calc non Af Amer  Date Value Ref Range Status  10/06/2019 36 (L) >59 mL/min/1.73 Final   eGFR  Date Value Ref Range Status  09/22/2021 44 (L) >59 mL/min/1.73 Final          Passed - Completed PHQ-2 or PHQ-9 in the last 360 days      Passed - Valid encounter within last 6 months    Recent Outpatient Visits          1 month ago Encounter for [employer] wellness exam   [facility]   6 months ago Type 2 diabetes mellitus with stage 3 chronic kidney disease, without long-term current use of insulin, unspecified whether stage 3a or 3b CKD (HManuel [place]   BKin[place]yGJerrol Banana, [place]   1 year ago Stage 3 chronic kidney disease, unspecified whether stage 3a or 3b CKD ([facility]   [place] DVickki Muff PA-C   1 year ago Essential hypertension   [facility]   2 years ago Diabetes mellitus due to underlying condition with diabetic polyneuropathy, without long-term current use of insulin ([facility]   [place] DVickki Muff PA-C      Future Appointments            In 3 months GJerrol Banana, MD [facility] P[facility]

## 2021-11-18 ENCOUNTER — Other Ambulatory Visit: Payer: Self-pay | Admitting: Physician Assistant

## 2021-11-18 DIAGNOSIS — R197 Diarrhea, unspecified: Secondary | ICD-10-CM

## 2021-11-18 NOTE — Telephone Encounter (Signed)
Requested Prescriptions  Pending Prescriptions Disp Refills  . cholestyramine (QUESTRAN) 4 g packet [Pharmacy Med Name: Cholestyramine 4 GM Oral Packet] 60 each 1    Sig: TAKE 1 PACKET BY MOUTH 3 TIMES A DAY     Cardiovascular:  Antilipid - Bile Acid Sequestrants Failed - 11/18/2021  1:18 PM      Failed - Lipid Panel in normal range within the last 12 months    Cholesterol, Total  Date Value Ref Range Status  09/22/2021 153 100 - 199 mg/dL Final   LDL Chol Calc (NIH)  Date Value Ref Range Status  09/22/2021 63 0 - 99 mg/dL Final   HDL  Date Value Ref Range Status  09/22/2021 62 >39 mg/dL Final   Triglycerides  Date Value Ref Range Status  09/22/2021 165 (H) 0 - 149 mg/dL Final         Passed - Valid encounter within last 12 months    Recent Outpatient Visits          1 month ago Encounter for [employer] annual wellness exam   [facility] Jerrol Banana., MD   7 months ago Type 2 diabetes mellitus with stage 3 chronic kidney disease, without long-term current use of insulin, unspecified whether stage 3a or 3b CKD ([place])   [place] Jerrol Banana., MD   1 year ago Stage 3 chronic kidney disease, unspecified whether stage 3a or 3b CKD [facility])   [place], Vickki Muff, PA-C   1 year ago Essential hypertension   [facility] Carles Collet M, [place]   2 years ago Diabetes mellitus due to underlying condition with diabetic polyneuropathy, without long-term current use of insulin [facility])   [place], Vickki Muff, PA-C      Future Appointments            In 3 months Jerrol Banana., MD [facility], [place]

## 2021-11-24 ENCOUNTER — Other Ambulatory Visit: Payer: Self-pay | Admitting: Family Medicine

## 2021-11-24 ENCOUNTER — Telehealth: Payer: Self-pay

## 2021-11-24 DIAGNOSIS — I10 Essential (primary) hypertension: Secondary | ICD-10-CM

## 2021-11-24 DIAGNOSIS — N183 Chronic kidney disease, stage 3 unspecified: Secondary | ICD-10-CM

## 2021-11-24 NOTE — Telephone Encounter (Signed)
Copied from [place] [id number]. Topic: General - Inquiry >> Nov 21, 2021 12:11 PM Cynthia Sims wrote: Reason for CRM: Pt stated she dropped off forms for handicapped sticker 2-3 weeks ago. Pt would like to pick up tomorrow morning at her husband's appointment. Please advise.

## 2021-11-24 NOTE — Telephone Encounter (Signed)
Requested Prescriptions  Pending Prescriptions Disp Refills  . carvedilol (COREG) 12.5 MG tablet [Pharmacy Med Name: Carvedilol 12.5 MG Oral Tablet] 180 tablet 0    Sig: TAKE 1 TABLET BY MOUTH TWICE DAILY WITH A MEAL     Cardiovascular: Beta Blockers 3 Failed - 11/24/2021  2:27 PM      Failed - Cr in normal range and within 360 days    Creatinine  Date Value Ref Range Status  03/01/2012 1.72 (H) 0.60 - 1.30 mg/dL Final   Creatinine, Ser  Date Value Ref Range Status  09/22/2021 1.29 (H) 0.57 - 1.00 mg/dL Final   Creatinine, POC  Date Value Ref Range Status  08/02/2017 NA mg/dL Final         Failed - Last BP in normal range    BP Readings from Last 1 Encounters:  09/22/21 (!) 146/70         Passed - AST in normal range and within 360 days    AST  Date Value Ref Range Status  09/22/2021 24 0 - 40 IU/L Final   SGOT(AST)  Date Value Ref Range Status  03/01/2012 47 (H) 15 - 37 Unit/L Final         Passed - ALT in normal range and within 360 days    ALT  Date Value Ref Range Status  09/22/2021 16 0 - 32 IU/L Final   SGPT (ALT)  Date Value Ref Range Status  03/01/2012 56 12 - 78 U/L Final         Passed - Last Heart Rate in normal range    Pulse Readings from Last 1 Encounters:  09/22/21 65         Passed - Valid encounter within last 6 months    Recent Outpatient Visits          2 months ago Encounter for [employer] annual wellness exam   [employer] Jerrol Banana., MD   7 months ago Type 2 diabetes mellitus with stage 3 chronic kidney disease, without long-term current use of insulin, unspecified whether stage 3a or 3b CKD ([place])   [place] Jerrol Banana., MD   1 year ago Stage 3 chronic kidney disease, unspecified whether stage 3a or 3b CKD ([place])   [place], Vickki Muff, PA-C   1 year ago Essential hypertension   [facility] Carles Collet M, PA-C   2 years ago Diabetes  mellitus due to underlying condition with diabetic polyneuropathy, without long-term current use of insulin ([place])   [place], Vickki Muff, PA-C      Future Appointments            In 2 months Jerrol Banana., MD [facility]           . lovastatin (MEVACOR) 40 MG tablet [Pharmacy Med Name: Lovastatin 40 MG Oral Tablet] 90 tablet 0    Sig: Take 1 tablet by mouth once daily     Cardiovascular:  Antilipid - Statins 2 Failed - 11/24/2021  2:27 PM      Failed - Cr in normal range and within 360 days    Creatinine  Date Value Ref Range Status  03/01/2012 1.72 (H) 0.60 - 1.30 mg/dL Final   Creatinine, Ser  Date Value Ref Range Status  09/22/2021 1.29 (H) 0.57 - 1.00 mg/dL Final   Creatinine, POC  Date Value Ref Range Status  08/02/2017 NA mg/dL Final  Failed - Lipid Panel in normal range within the last 12 months    Cholesterol, Total  Date Value Ref Range Status  09/22/2021 153 100 - 199 mg/dL Final   LDL Chol Calc (NIH)  Date Value Ref Range Status  09/22/2021 63 0 - 99 mg/dL Final   HDL  Date Value Ref Range Status  09/22/2021 62 >39 mg/dL Final   Triglycerides  Date Value Ref Range Status  09/22/2021 165 (H) 0 - 149 mg/dL Final         Passed - Patient is not pregnant      Passed - Valid encounter within last 12 months    Recent Outpatient Visits          2 months ago Encounter for [employer] annual wellness exam   [employer] Jerrol Banana., MD   7 months ago Type 2 diabetes mellitus with stage 3 chronic kidney disease, without long-term current use of insulin, unspecified whether stage 3a or 3b CKD ([place])   [place] Jerrol Banana., MD   1 year ago Stage 3 chronic kidney disease, unspecified whether stage 3a or 3b CKD [facility])   [place], Vickki Muff, PA-C   1 year ago Essential hypertension   [facility] Carles Collet  M, PA-C   2 years ago Diabetes mellitus due to underlying condition with diabetic polyneuropathy, without long-term current use of insulin [facility])   [place], Vickki Muff, PA-C      Future Appointments            In 2 months Jerrol Banana., MD [facility], [place]

## 2021-11-24 NOTE — Telephone Encounter (Signed)
Noted. Patient was given a handicap form this week at his ov.

## 2021-12-14 ENCOUNTER — Other Ambulatory Visit: Payer: Self-pay | Admitting: Family Medicine

## 2021-12-14 DIAGNOSIS — E0842 Diabetes mellitus due to underlying condition with diabetic polyneuropathy: Secondary | ICD-10-CM

## 2022-01-22 ENCOUNTER — Other Ambulatory Visit: Payer: Self-pay | Admitting: Family Medicine

## 2022-01-22 DIAGNOSIS — F411 Generalized anxiety disorder: Secondary | ICD-10-CM

## 2022-01-22 DIAGNOSIS — E0842 Diabetes mellitus due to underlying condition with diabetic polyneuropathy: Secondary | ICD-10-CM

## 2022-01-30 DIAGNOSIS — E119 Type 2 diabetes mellitus without complications: Secondary | ICD-10-CM | POA: Diagnosis not present

## 2022-01-30 DIAGNOSIS — H35033 Hypertensive retinopathy, bilateral: Secondary | ICD-10-CM | POA: Diagnosis not present

## 2022-01-30 DIAGNOSIS — Z7984 Long term (current) use of oral hypoglycemic drugs: Secondary | ICD-10-CM | POA: Diagnosis not present

## 2022-01-30 DIAGNOSIS — Z961 Presence of intraocular lens: Secondary | ICD-10-CM | POA: Diagnosis not present

## 2022-01-30 LAB — HM DIABETES EYE EXAM

## 2022-01-31 ENCOUNTER — Encounter: Payer: Self-pay | Admitting: *Deleted

## 2022-02-17 NOTE — Progress Notes (Signed)
 Established patient visit  I,April Miller,acting as a scribe for Megan Mans, MD.,have documented all relevant documentation on the behalf of Megan Mans, MD,as directed by  Megan Mans, MD while in the presence of Megan Mans, MD.   [name]   DOB: 1950/10/24   70 y.o. Female  MRN: [id number] Visit Date: 02/20/2022  Today's healthcare provider: Megan Mans, MD   Chief Complaint  Patient presents with   Follow-up   Diabetes   Hypertension   Subjective    HPI  Very sweet 71 year old comes in today for follow-up.  She is taking medications as prescribed.  No hypoglycemia per patient. Her only complaint today is 1 of numbness and tingling in her feet and toes.  He gets uncomfortable especially at night.  Diabetes Mellitus Type II, follow-up  Lab Results  Component Value Date   HGBA1C 6.3 (A) 02/20/2022   HGBA1C 6.6 (H) 09/22/2021   HGBA1C 6.3 (A) 04/13/2021   Last seen for diabetes 5 months ago.  Management since then includes continuing the same treatment.  Home blood sugar records: trend: decreasing steadily Most Recent Eye Exam: 01/30/2022  --------------------------------------------------------------------------------------------------- Hypertension, follow-up  BP Readings from Last 3 Encounters:  02/20/22 (!) 155/73  09/22/21 (!) 146/70  04/13/21 138/81   Wt Readings from Last 3 Encounters:  02/20/22 193 lb (87.5 kg)  09/22/21 188 lb (85.3 kg)  04/13/21 193 lb (87.5 kg)     She was last seen for hypertension 5 months ago.  Management since that visit includes; on carvedilol 25 mg and losartan 25 mg.  Outside blood pressures are .  ---------------------------------------------------------------------------------------------------   Medications: Outpatient Medications Prior to Visit  Medication Sig   acetaminophen (TYLENOL) 500 MG tablet Take 500 mg by mouth every 6 (six) hours as needed.    aspirin  EC 81 MG tablet Take 81 mg by mouth daily.    carvedilol (COREG) 12.5 MG tablet TAKE 1 TABLET BY MOUTH TWICE DAILY WITH A MEAL   cholestyramine (QUESTRAN) 4 g packet TAKE 1 PACKET BY MOUTH 3 TIMES A DAY   cyanocobalamin (,VITAMIN B-12,) 1000 MCG/ML injection Inject into the muscle daily.   DULoxetine (CYMBALTA) 60 MG capsule Take 1 capsule by mouth once daily   glipiZIDE (GLUCOTROL) 10 MG tablet Take 1 tablet (10 mg total) by mouth 2 (two) times daily.   glucose blood (ONETOUCH ULTRA) test strip 1 each by Other route 3 (three) times daily. Use as instructed   Insulin Glargine (BASAGLAR KWIKPEN) 100 UNIT/ML INJECT 24 UNITS SUBCUTANEOUSLY ONCE DAILY   Lancets (ONETOUCH DELICA PLUS LANCET33G) MISC USE 1  TO CHECK GLUCOSE ONCE DAILY   lovastatin (MEVACOR) 40 MG tablet Take 1 tablet by mouth once daily   metFORMIN (GLUCOPHAGE) 500 MG tablet Take 1 tablet (500 mg total) by mouth 2 (two) times daily with a meal.   omeprazole (PRILOSEC) 40 MG capsule Take 1 capsule (40 mg total) by mouth daily.   [DISCONTINUED] losartan (COZAAR) 25 MG tablet Take 1 tablet (25 mg total) by mouth daily.   No facility-administered medications prior to visit.    Review of Systems  Constitutional:  Negative for appetite change, chills, fatigue and fever.  Respiratory:  Negative for chest tightness and shortness of breath.   Cardiovascular:  Negative for chest pain and palpitations.  Gastrointestinal:  Negative for abdominal pain, nausea and vomiting.  Neurological:  Negative for dizziness and weakness.    Last hemoglobin A1c  Lab Results  Component Value Date   HGBA1C 6.3 (A) 02/20/2022       Objective    BP (!) 155/73 (BP Location: Left Arm, Patient Position: Sitting, Cuff Size: Large)   Pulse 65   Resp 16   Wt 193 lb (87.5 kg)   SpO2 100%   BMI 36.47 kg/m  BP Readings from Last 3 Encounters:  02/20/22 (!) 155/73  09/22/21 (!) 146/70  04/13/21 138/81   Wt Readings from Last 3 Encounters:  02/20/22  193 lb (87.5 kg)  09/22/21 188 lb (85.3 kg)  04/13/21 193 lb (87.5 kg)      Physical Exam Vitals reviewed.  Constitutional:      General: She is not in acute distress.    Appearance: She is well-developed.  HENT:     Head: Normocephalic and atraumatic.     Right Ear: Hearing normal.     Left Ear: Hearing normal.     Nose: Nose normal.  Eyes:     General: Lids are normal. No scleral icterus.       Right eye: No discharge.        Left eye: No discharge.     Conjunctiva/sclera: Conjunctivae normal.  Cardiovascular:     Rate and Rhythm: Normal rate and regular rhythm.     Pulses: Normal pulses.     Heart sounds: Normal heart sounds.  Pulmonary:     Effort: Pulmonary effort is normal. No respiratory distress.     Breath sounds: Normal breath sounds.  Abdominal:     General: Bowel sounds are normal.     Palpations: Abdomen is soft.  Musculoskeletal:     Cervical back: Normal range of motion and neck supple.  Skin:    General: Skin is warm and dry.     Findings: No lesion or rash.  Neurological:     Mental Status: She is alert and oriented to person, place, and time.     Comments: Decreased sensation in her toes not on the proximal portion of the feet  Psychiatric:        Speech: Speech normal.        Behavior: Behavior normal.        Thought Content: Thought content normal.       Results for orders placed or performed in visit on 02/20/22  POCT glycosylated hemoglobin (Hb A1C)  Result Value Ref Range   Hemoglobin A1C 6.3 (A) 4.0 - 5.6 %   Est. average glucose Bld gHb Est-mCnc 134     Assessment & Plan     1. Need for influenza vaccination  - Flu Vaccine QUAD High Dose(Fluad)  2. Type 2 diabetes mellitus with stage 3 chronic kidney disease, without long-term current use of insulin, unspecified whether stage 3a or 3b CKD (HCC) Time stop glipizide and may need to cut back on her insulin as her A1c is down to 6.3 from 6.6. - POCT glycosylated hemoglobin (Hb  A1C)  3. Other diabetic neurological complication associated with type 2 diabetes mellitus (HCC) Gabapentin - gabapentin (NEURONTIN) 100 MG capsule; Take 1 capsule (100 mg total) by mouth 3 (three) times daily.  Dispense: 90 capsule; Refill: 2  4. Essential hypertension Increase losartan from 25 to 50 mg daily - losartan (COZAAR) 50 MG tablet; Take 1 tablet (50 mg total) by mouth daily.  Dispense: 90 tablet; Refill: 3  5. Primary hypertension   6. Gastroesophageal reflux disease, unspecified whether esophagitis present On omeprazole chronically  7. Mild cognitive  disorder Will need MMSE in the future  8. Type 2 diabetes mellitus with diabetic neuropathy, unspecified whether long term insulin use (HCC)    Return in about 3 months (around 05/22/2022).      I, Megan Mans, MD, have reviewed all documentation for this visit. The documentation on 02/22/22 for the exam, diagnosis, procedures, and orders are all accurate and complete.    [place] Apgar Wendelyn Breslow, MD  [facility] [phone] (phone) [phone] (fax)  [facility]

## 2022-02-20 ENCOUNTER — Encounter: Payer: Self-pay | Admitting: Family Medicine

## 2022-02-20 ENCOUNTER — Ambulatory Visit (INDEPENDENT_AMBULATORY_CARE_PROVIDER_SITE_OTHER): Payer: Medicare HMO | Admitting: Family Medicine

## 2022-02-20 VITALS — BP 155/73 | HR 65 | Resp 16 | Wt 193.0 lb

## 2022-02-20 DIAGNOSIS — Z23 Encounter for immunization: Secondary | ICD-10-CM

## 2022-02-20 DIAGNOSIS — N183 Chronic kidney disease, stage 3 unspecified: Secondary | ICD-10-CM | POA: Diagnosis not present

## 2022-02-20 DIAGNOSIS — E1149 Type 2 diabetes mellitus with other diabetic neurological complication: Secondary | ICD-10-CM

## 2022-02-20 DIAGNOSIS — R69 Illness, unspecified: Secondary | ICD-10-CM | POA: Diagnosis not present

## 2022-02-20 DIAGNOSIS — K219 Gastro-esophageal reflux disease without esophagitis: Secondary | ICD-10-CM

## 2022-02-20 DIAGNOSIS — F09 Unspecified mental disorder due to known physiological condition: Secondary | ICD-10-CM

## 2022-02-20 DIAGNOSIS — E1140 Type 2 diabetes mellitus with diabetic neuropathy, unspecified: Secondary | ICD-10-CM | POA: Diagnosis not present

## 2022-02-20 DIAGNOSIS — I10 Essential (primary) hypertension: Secondary | ICD-10-CM

## 2022-02-20 DIAGNOSIS — E1122 Type 2 diabetes mellitus with diabetic chronic kidney disease: Secondary | ICD-10-CM | POA: Diagnosis not present

## 2022-02-20 LAB — POCT GLYCOSYLATED HEMOGLOBIN (HGB A1C)
Est. average glucose Bld gHb Est-mCnc: 134
Hemoglobin A1C: 6.3 % — AB (ref 4.0–5.6)

## 2022-02-20 MED ORDER — LOSARTAN POTASSIUM 50 MG PO TABS: 50.0000 mg | ORAL_TABLET | Freq: Every day | ORAL | 3 refills | Status: DC

## 2022-02-20 MED ORDER — GABAPENTIN 100 MG PO CAPS: 100.0000 mg | ORAL_CAPSULE | Freq: Three times a day (TID) | ORAL | 2 refills | Status: DC

## 2022-02-20 NOTE — Patient Instructions (Signed)
STOP GLIPIZIDE.

## 2022-03-11 ENCOUNTER — Other Ambulatory Visit: Payer: Self-pay | Admitting: Family Medicine

## 2022-03-11 DIAGNOSIS — N183 Chronic kidney disease, stage 3 unspecified: Secondary | ICD-10-CM

## 2022-03-13 NOTE — Telephone Encounter (Signed)
Requested Prescriptions  Pending Prescriptions Disp Refills  . lovastatin (MEVACOR) 40 MG tablet [Pharmacy Med Name: Lovastatin 40 MG Oral Tablet] 90 tablet 1    Sig: Take 1 tablet by mouth once daily     Cardiovascular:  Antilipid - Statins 2 Failed - 03/11/2022  1:06 PM      Failed - Cr in normal range and within 360 days    Creatinine  Date Value Ref Range Status  03/01/2012 1.72 (H) 0.60 - 1.30 mg/dL Final   Creatinine, Ser  Date Value Ref Range Status  09/22/2021 1.29 (H) 0.57 - 1.00 mg/dL Final   Creatinine, POC  Date Value Ref Range Status  08/02/2017 NA mg/dL Final         Failed - Lipid Panel in normal range within the last 12 months    Cholesterol, Total  Date Value Ref Range Status  09/22/2021 153 100 - 199 mg/dL Final   LDL Chol Calc (NIH)  Date Value Ref Range Status  09/22/2021 63 0 - 99 mg/dL Final   HDL  Date Value Ref Range Status  09/22/2021 62 >39 mg/dL Final   Triglycerides  Date Value Ref Range Status  09/22/2021 165 (H) 0 - 149 mg/dL Final         Passed - Patient is not pregnant      Passed - Valid encounter within last 12 months    Recent Outpatient Visits          3 weeks ago Type 2 diabetes mellitus with stage 3 chronic kidney disease, without long-term current use of insulin, unspecified whether stage 3a or 3b CKD ([place])   [place] Jerrol Banana., MD   5 months ago Encounter for [employer] annual wellness exam   [facility] Jerrol Banana., MD   11 months ago Type 2 diabetes mellitus with stage 3 chronic kidney disease, without long-term current use of insulin, unspecified whether stage 3a or 3b CKD [facility])   [place], Richard L Jr., MD   1 year ago Stage 3 chronic kidney disease, unspecified whether stage 3a or 3b CKD [facility])   [place], Vickki Muff, PA-C   2 years ago Essential hypertension   [place], Wendee Beavers, [place]       Future Appointments            In 2 months Simmons-Robinson, Riki Sheer, MD [facility], [place]

## 2022-03-19 ENCOUNTER — Other Ambulatory Visit: Payer: Self-pay | Admitting: Family Medicine

## 2022-03-19 DIAGNOSIS — I10 Essential (primary) hypertension: Secondary | ICD-10-CM

## 2022-03-20 NOTE — Telephone Encounter (Signed)
Requested Prescriptions  Pending Prescriptions Disp Refills  . carvedilol (COREG) 12.5 MG tablet [Pharmacy Med Name: Carvedilol 12.5 MG Oral Tablet] 180 tablet 0    Sig: TAKE 1 TABLET BY MOUTH TWICE DAILY WITH A MEAL     Cardiovascular: Beta Blockers 3 Failed - 03/19/2022 11:45 AM      Failed - Cr in normal range and within 360 days    Creatinine  Date Value Ref Range Status  03/01/2012 1.72 (H) 0.60 - 1.30 mg/dL Final   Creatinine, Ser  Date Value Ref Range Status  09/22/2021 1.29 (H) 0.57 - 1.00 mg/dL Final   Creatinine, POC  Date Value Ref Range Status  08/02/2017 NA mg/dL Final         Failed - Last BP in normal range    BP Readings from Last 1 Encounters:  02/20/22 (!) 155/73         Passed - AST in normal range and within 360 days    AST  Date Value Ref Range Status  09/22/2021 24 0 - 40 IU/L Final   SGOT(AST)  Date Value Ref Range Status  03/01/2012 47 (H) 15 - 37 Unit/L Final         Passed - ALT in normal range and within 360 days    ALT  Date Value Ref Range Status  09/22/2021 16 0 - 32 IU/L Final   SGPT (ALT)  Date Value Ref Range Status  03/01/2012 56 12 - 78 U/L Final         Passed - Last Heart Rate in normal range    Pulse Readings from Last 1 Encounters:  02/20/22 65         Passed - Valid encounter within last 6 months    Recent Outpatient Visits          4 weeks ago Type 2 diabetes mellitus with stage 3 chronic kidney disease, without long-term current use of insulin, unspecified whether stage 3a or 3b CKD ([place])   [facility] Jerrol Banana., MD   5 months ago Encounter for [employer] annual wellness exam   [facility] Jerrol Banana., MD   11 months ago Type 2 diabetes mellitus with stage 3 chronic kidney disease, without long-term current use of insulin, unspecified whether stage 3a or 3b CKD ([place])   [place] Jerrol Banana., MD   1 year ago Stage 3 chronic kidney disease,  unspecified whether stage 3a or 3b CKD [facility])   [place], Vickki Muff, PA-C   2 years ago Essential hypertension   [place], Wendee Beavers, [place]      Future Appointments            In 1 month MacDiarmid, Nicki Reaper, [place]   In 2 months Simmons-Robinson, Riki Sheer, MD [facility], [place]

## 2022-04-11 ENCOUNTER — Other Ambulatory Visit: Payer: Self-pay | Admitting: Physician Assistant

## 2022-04-11 DIAGNOSIS — R197 Diarrhea, unspecified: Secondary | ICD-10-CM

## 2022-04-18 ENCOUNTER — Other Ambulatory Visit: Payer: Self-pay | Admitting: Family Medicine

## 2022-04-18 DIAGNOSIS — E0842 Diabetes mellitus due to underlying condition with diabetic polyneuropathy: Secondary | ICD-10-CM

## 2022-04-18 MED ORDER — BASAGLAR KWIKPEN 100 UNIT/ML ~~LOC~~ SOPN: PEN_INJECTOR | SUBCUTANEOUS | 1 refills | Status: DC

## 2022-04-18 NOTE — Telephone Encounter (Signed)
Medication Refill - Medication: Insulin Glargine (BASAGLAR KWIKPEN) 100 UNIT/ML   Has the patient contacted their pharmacy? Yes.   (Agent: If no, request that the patient contact the pharmacy for the refill. If patient does not wish to contact the pharmacy document the reason why and proceed with request.) (Agent: If yes, when and what did the pharmacy advise?)  Preferred Pharmacy (with phone number or street name):  [place], [place] - [address]  [place] [address]  Phone: [phone] Fax: [phone]   Has the patient been seen for an appointment in the last year OR does the patient have an upcoming appointment? Yes.    Agent: Please be advised that RX refills may take up to 3 business days. We ask that you follow-up with your pharmacy.

## 2022-04-18 NOTE — Telephone Encounter (Signed)
Requested Prescriptions  Pending Prescriptions Disp Refills   Insulin Glargine (BASAGLAR KWIKPEN) 100 UNIT/ML 15 mL 1    Sig: INJECT 24 UNITS SUBCUTANEOUSLY ONCE DAILY     Endocrinology:  Diabetes - Insulins Passed - 04/18/2022  3:16 PM      Passed - HBA1C is between 0 and 7.9 and within 180 days    Hemoglobin A1C  Date Value Ref Range Status  02/20/2022 6.3 (A) 4.0 - 5.6 % Final   Hgb A1c MFr Bld  Date Value Ref Range Status  09/22/2021 6.6 (H) 4.8 - 5.6 % Final    Comment:             Prediabetes: 5.7 - 6.4          Diabetes: >6.4          Glycemic control for adults with diabetes: <7.0          Passed - Valid encounter within last 6 months    Recent Outpatient Visits           1 month ago Type 2 diabetes mellitus with stage 3 chronic kidney disease, without long-term current use of insulin, unspecified whether stage 3a or 3b CKD ([place])   [facility] Jerrol Banana., MD   6 months ago Encounter for [employer] annual wellness exam   [facility] Jerrol Banana., MD   1 year ago Type 2 diabetes mellitus with stage 3 chronic kidney disease, without long-term current use of insulin, unspecified whether stage 3a or 3b CKD [facility])   [facility] Jerrol Banana., MD   1 year ago Stage 3 chronic kidney disease, unspecified whether stage 3a or 3b CKD [facility])   [place], Vickki Muff, PA-C   2 years ago Essential hypertension   [place], Wendee Beavers, [place]       Future Appointments             In 6 days MacDiarmid, Nicki Reaper, MD [place]   In 1 month Simmons-Robinson, [place], MD [facility], [facility]

## 2022-04-24 ENCOUNTER — Encounter: Payer: Self-pay | Admitting: Urology

## 2022-04-24 ENCOUNTER — Ambulatory Visit: Payer: Medicare HMO | Admitting: Urology

## 2022-04-24 VITALS — BP 143/79 | HR 71 | Ht 61.0 in | Wt 193.0 lb

## 2022-04-24 DIAGNOSIS — N3020 Other chronic cystitis without hematuria: Secondary | ICD-10-CM | POA: Diagnosis not present

## 2022-04-24 DIAGNOSIS — R339 Retention of urine, unspecified: Secondary | ICD-10-CM | POA: Diagnosis not present

## 2022-04-24 DIAGNOSIS — Z87440 Personal history of urinary (tract) infections: Secondary | ICD-10-CM

## 2022-04-24 LAB — URINALYSIS, COMPLETE
Bilirubin, UA: NEGATIVE
Glucose, UA: NEGATIVE
Ketones, UA: NEGATIVE
Nitrite, UA: POSITIVE — AB
Protein,UA: NEGATIVE
Specific Gravity, UA: 1.025 (ref 1.005–1.030)
Urobilinogen, Ur: 0.2 mg/dL (ref 0.2–1.0)
pH, UA: 5 (ref 5.0–7.5)

## 2022-04-24 LAB — MICROSCOPIC EXAMINATION: WBC, UA: >30 /hpf — AB (ref 0–5)

## 2022-04-24 NOTE — Progress Notes (Signed)
04/24/2022 9:58 AM   Cynthia Sims 1950-12-11 [id number]  Referring provider: Jerrol Banana., MD No address on file  Chief Complaint  Patient presents with   Cystitis   New Patient (Initial Visit)    HPI: Dr Chauncey Cruel: I had the pleasure of seeing Cynthia Sims in urology clinic today in consultation for urinary retention and recurrent UTI from Dr. Brita Romp.  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  She was previously followed by urogynecology at [facility], and had urodynamics in 2016.  Urodynamics showed no evidence of detrusor overactivity.  There was reduced bladder sensation, normal compliance, and atonic detrusor.  She was offered InterStim at that time, but that she did not follow-up.  Baseline creatinine is approximately 1.4.  Recent CT in May 2019 did not show any hydronephrosis.  She is also had difficulty with recurrent UTIs recently, including Klebsiella 02/20/2018.  She does have some pelvic pain with UTI.  However, on chart review it is unclear if she has been treated for asymptomatic bacteriuria versus true urinary tract infection.     She has previously undergone pelvic floor physical therapy which did not improve her voiding.   Today Incomplete bladder emptying stable.  Clinically not infected.  I agree that sometimes she gets cloudy urine that comes and goes.  She is currently on a once a day antibiotic and she is not certain if it is helping.  I am not convinced that she is getting many symptomatic urinary tract infections.  I will not send today's urine and overtreated   She still catheterizing 4 times a day.  She is continent.  She does feel bladder fullness.  Other than oral hypoglycemics she does not have other neurologic issues   We talked about InterStim in detail.  Usual template detail.  The role of the test stimulation discussed.  Recognizing she has retention I still  offered the PNE and not the surgical lead stage I.  We will try to leave it in longer.  Based upon the clinical presentation and assessment at [facility] I did not recommend repeat testing.  If she did have InterStim could be done here locally    she wants to think about it.  I gave her my phone number in [place] if she ever to have the test stimulation.  I can understand why she is hesitant.  I think the chance of success is likely 10 to 20% but not higher.  Otherwise she will be seen as needed.  Treat only symptomatic urinary tract infections   Treated Incomplete bladder emptying stable.  I saw the patient in 2019 No infections.  Continent in between catheterization 4 times a day PMH: Past Medical History:  Diagnosis Date   ALT (SGPT) level raised    Anemia    Anxiety    Arthritis    Depression    Diabetes mellitus without complication (HCC)    Elevated cholesterol    GERD (gastroesophageal reflux disease)    Hemorrhoids    Hypertension    Kidney disease     Surgical History: Past Surgical History:  Procedure Laterality Date   abdominal blockage     CESAREAN SECTION  1983   CESAREAN SECTION     CHOLECYSTECTOMY     COLONOSCOPY WITH PROPOFOL N/A 10/29/2015   Procedure: COLONOSCOPY WITH PROPOFOL;  Surgeon: Manya Silvas, MD;  Location: [place];  Service: Endoscopy;  Laterality: N/A;   COLONOSCOPY WITH PROPOFOL N/A 11/07/2017   Procedure: COLONOSCOPY WITH PROPOFOL;  Surgeon: Toledo, Benay Pike, MD;  Location: [facility] ENDOSCOPY;  Service: Gastroenterology;  Laterality: N/A;   DILATION AND CURETTAGE OF UTERUS  2001   ESOPHAGOGASTRODUODENOSCOPY (EGD) WITH PROPOFOL N/A 10/29/2015   Procedure: ESOPHAGOGASTRODUODENOSCOPY (EGD) WITH PROPOFOL;  Surgeon: Manya Silvas, MD;  Location: [facility] ENDOSCOPY;  Service: Endoscopy;  Laterality: N/A;    Home Medications:  Allergies as of 04/24/2022   No Known Allergies      Medication List        Accurate as of April 24, 2022  9:58 AM. If you have any questions, ask your nurse or doctor.          acetaminophen 500 MG tablet Commonly known as: TYLENOL Take 500 mg by mouth every 6 (six) hours as needed.   aspirin EC 81 MG tablet Take 81 mg by mouth daily.   Basaglar KwikPen 100 UNIT/ML INJECT 24 UNITS SUBCUTANEOUSLY ONCE DAILY   carvedilol 12.5 MG tablet Commonly known as: COREG TAKE 1 TABLET BY MOUTH TWICE DAILY WITH A MEAL   cholestyramine 4 g packet Commonly known as: QUESTRAN DISSOLVE & TAKE 1 POWDER BY MOUTH THREE TIMES DAILY   cyanocobalamin 1000 MCG/ML injection Commonly known as: VITAMIN B12 Inject into the muscle daily.   DULoxetine 60 MG capsule Commonly known as: CYMBALTA Take 1 capsule by mouth once daily   gabapentin 100 MG capsule Commonly known as: NEURONTIN Take 1 capsule (100 mg total) by mouth 3 (three) times daily.   glipiZIDE 10 MG tablet Commonly known as: GLUCOTROL Take 1 tablet (10 mg total) by mouth 2 (two) times daily.   losartan 50 MG tablet Commonly known as: COZAAR Take 1 tablet (50 mg total) by mouth daily.   lovastatin 40 MG tablet Commonly known as: MEVACOR Take 1 tablet by mouth once daily   metFORMIN 500 MG tablet Commonly known as: GLUCOPHAGE Take 1 tablet (500 mg total) by mouth 2 (two) times daily with a meal.   omeprazole 40 MG capsule Commonly known as: PRILOSEC Take 1 capsule (40 mg total) by mouth daily.   OneTouch Delica Plus [id number] Misc USE 1  TO CHECK GLUCOSE ONCE DAILY   OneTouch Ultra test strip Generic drug: glucose blood 1 each by Other route 3 (three) times daily. Use as instructed        Allergies: No Known Allergies  Family History: Family History  Problem Relation Age of Onset   Alzheimer's disease Mother    COPD Mother    Breast cancer Mother 10   Osteoporosis Mother    Heart attack Father    Lupus Father    Diabetes Father    Hypertension Father    Diabetes Sister    COPD Maternal Grandmother     Diabetes Paternal Grandfather    Diabetes Sister    Diabetes Sister     Social History:  reports that she has never smoked. She has never been exposed to tobacco smoke. She has never used smokeless tobacco. She reports that she does not drink alcohol and does not use drugs.  ROS:                                        Physical Exam: BP (!) 143/79   Pulse 71   Ht '5\' 1"'$  (1.549 m)   Wt 87.5  kg   BMI 36.47 kg/m   Constitutional:  Alert and oriented, No acute distress. HEENT: [place] AT, moist mucus membranes.  Trachea midline, no masses.  Laboratory Data: Lab Results  Component Value Date   WBC 6.1 09/22/2021   HGB 11.9 09/22/2021   HCT 35.6 09/22/2021   MCV 89 09/22/2021   PLT 164 09/22/2021    Lab Results  Component Value Date   CREATININE 1.29 (H) 09/22/2021    No results found for: "PSA"  No results found for: "TESTOSTERONE"  Lab Results  Component Value Date   HGBA1C 6.3 (A) 02/20/2022    Urinalysis    Component Value Date/Time   APPEARANCEUR Cloudy (A) 04/29/2018 0950   GLUCOSEU Negative 04/29/2018 0950   BILIRUBINUR negative 02/24/2019 1123   BILIRUBINUR Negative 04/29/2018 0950   PROTEINUR Positive (A) 02/24/2019 1123   PROTEINUR Negative 04/29/2018 0950   UROBILINOGEN 0.2 02/24/2019 1123   NITRITE Negative 02/24/2019 1123   NITRITE Positive (A) 04/29/2018 0950   LEUKOCYTESUR Moderate (2+) (A) 02/24/2019 1123   LEUKOCYTESUR 1+ (A) 04/29/2018 0950    Pertinent Imaging:   Assessment & Plan: Patient is stable incomplete bladder emptying.  We will get a 2 years or so.  Call if abnormal.  Were not can to proceed with test stimulation.  I think the chance of helping is approximately 10% or so  1. Chronic cystitis  - Urinalysis, Complete   No follow-ups on file.  Reece Packer, MD  [facility] [address], [place], [place] [address] [phone]

## 2022-04-24 NOTE — Addendum Note (Signed)
Addended by: Despina Hidden on: 04/24/2022 04:24 PM   Modules accepted: Orders

## 2022-04-24 NOTE — Patient Instructions (Signed)
Need 2 year follow-up

## 2022-04-27 ENCOUNTER — Ambulatory Visit
Admission: RE | Admit: 2022-04-27 | Discharge: 2022-04-27 | Disposition: A | Discharge status: Home / Self Care | Payer: Medicare HMO | Source: Ambulatory Visit | Attending: Urology | Admitting: Urology

## 2022-04-27 DIAGNOSIS — N1330 Unspecified hydronephrosis: Secondary | ICD-10-CM | POA: Insufficient documentation

## 2022-04-27 DIAGNOSIS — N3020 Other chronic cystitis without hematuria: Secondary | ICD-10-CM | POA: Insufficient documentation

## 2022-04-27 LAB — CULTURE, URINE COMPREHENSIVE

## 2022-05-01 ENCOUNTER — Other Ambulatory Visit: Payer: Self-pay | Admitting: *Deleted

## 2022-05-01 MED ORDER — NITROFURANTOIN MONOHYD MACRO 100 MG PO CAPS: 100.0000 mg | ORAL_CAPSULE | Freq: Two times a day (BID) | ORAL | 0 refills | Status: AC

## 2022-05-08 ENCOUNTER — Other Ambulatory Visit: Payer: Self-pay | Admitting: Family Medicine

## 2022-05-08 DIAGNOSIS — E0842 Diabetes mellitus due to underlying condition with diabetic polyneuropathy: Secondary | ICD-10-CM

## 2022-05-08 DIAGNOSIS — F411 Generalized anxiety disorder: Secondary | ICD-10-CM

## 2022-05-08 NOTE — Telephone Encounter (Signed)
[facility] faxed refill request for the following medications:    DULoxetine (CYMBALTA) 60 MG capsule   Please advise

## 2022-05-10 MED ORDER — DULOXETINE HCL 60 MG PO CPEP: 60.0000 mg | ORAL_CAPSULE | Freq: Every day | ORAL | 0 refills | Status: DC

## 2022-05-19 ENCOUNTER — Telehealth: Payer: Self-pay | Admitting: Family Medicine

## 2022-05-19 DIAGNOSIS — E1149 Type 2 diabetes mellitus with other diabetic neurological complication: Secondary | ICD-10-CM

## 2022-05-19 MED ORDER — GABAPENTIN 100 MG PO CAPS: 100.0000 mg | ORAL_CAPSULE | Freq: Three times a day (TID) | ORAL | 2 refills | Status: DC

## 2022-05-19 NOTE — Telephone Encounter (Signed)
[facility] faxed refill request for the following medications:    gabapentin (NEURONTIN) 100 MG capsule    Please advise

## 2022-05-22 ENCOUNTER — Encounter: Payer: Self-pay | Admitting: Family Medicine

## 2022-05-22 ENCOUNTER — Ambulatory Visit (INDEPENDENT_AMBULATORY_CARE_PROVIDER_SITE_OTHER): Payer: Medicare HMO | Admitting: Family Medicine

## 2022-05-22 VITALS — BP 174/80 | HR 61 | Temp 98.1°F | Resp 16 | Wt 193.8 lb

## 2022-05-22 DIAGNOSIS — I10 Essential (primary) hypertension: Secondary | ICD-10-CM

## 2022-05-22 DIAGNOSIS — E1149 Type 2 diabetes mellitus with other diabetic neurological complication: Secondary | ICD-10-CM

## 2022-05-22 DIAGNOSIS — E0842 Diabetes mellitus due to underlying condition with diabetic polyneuropathy: Secondary | ICD-10-CM | POA: Diagnosis not present

## 2022-05-22 DIAGNOSIS — E1140 Type 2 diabetes mellitus with diabetic neuropathy, unspecified: Secondary | ICD-10-CM | POA: Insufficient documentation

## 2022-05-22 MED ORDER — LOSARTAN POTASSIUM 50 MG PO TABS: 50.0000 mg | ORAL_TABLET | Freq: Two times a day (BID) | ORAL | 1 refills | Status: DC

## 2022-05-22 NOTE — Patient Instructions (Addendum)
Please bring urine sample at your next appt on 06/09/22  Your blood pressure was elevated today so please start taking the Losartan '50mg'$  twice daily    Please check your blood pressure in the afternoons before lunch until our next appointment  Please continue your diabetes regimen as prescribed. Your diabetes is well controlled. Keep up the good work!    [facility]  Tips to measure your blood pressure correctly  Here's what you can do to ensure a correct reading:  Don't drink a caffeinated beverage or smoke during the 30 minutes before the test.  Sit quietly for five minutes before the test begins.  During the measurement, sit in a chair with your feet on the floor and your arm supported so your elbow is at about heart level.  The inflatable part of the cuff should completely cover at least 80% of your upper arm, and the cuff should be placed on bare skin, not over a shirt.  Don't talk during the measurement.  Have your blood pressure measured twice, with a brief break in between. If the readings are different by 5 points or more, have it done a third time. There are times to break these rules. If you sometimes feel lightheaded when getting out of bed in the morning or when you stand after sitting, you should have your blood pressure checked while seated and then while standing to see if it falls from one position to the next.  Stage 1 hypertension, Stage 2 hypertension, and hypertensive crisis (see "Blood pressure categories"). Blood pressure categories  Blood pressure category SYSTOLIC (upper number)  DIASTOLIC (lower number)  Normal Less than 120 mm Hg and Less than 80 mm Hg  Elevated 120-129 mm Hg and Less than 80 mm Hg  High blood pressure: Stage 1 hypertension 130-139 mm Hg or 80-89 mm Hg  High blood pressure: Stage 2 hypertension 140 mm Hg or higher or 90 mm Hg or higher  Hypertensive crisis (consult your doctor immediately) Higher than 180 mm Hg and/or Higher than 120 mm Hg

## 2022-05-22 NOTE — Progress Notes (Signed)
I,Cynthia Sims,acting as a scribe for [employer], MD.,have documented all relevant documentation on the behalf of Cynthia Foster, MD,as directed by  Cynthia Foster, MD while in the presence of Cynthia Foster, MD.   Established patient visit   [name]   DOB: [birth year]   71 y.o. Female  MRN: [id number] Visit Date: 05/22/2022  Today's healthcare provider: Eulis Foster, MD   Chief Complaint  Patient presents with   follow-Up chronic disease   Subjective    HPI   Diabetes Mellitus Type II, Follow-up  Lab Results  Component Value Date   HGBA1C 6.3 (A) 02/20/2022   HGBA1C 6.6 (H) 09/22/2021   HGBA1C 6.3 (A) 04/13/2021   Wt Readings from Last 3 Encounters:  05/22/22 193 lb 12.8 oz (87.9 kg)  04/24/22 193 lb (87.5 kg)  02/20/22 193 lb (87.5 kg)   Last seen for diabetes 4 months ago.  Management since then includes glipizide 64m twice daily, metformin 5034mtwice daily and 24 units glargine. She reports excellent compliance with treatment.  Symptoms: No fatigue No foot ulcerations  No appetite changes No nausea  Yes paresthesia of the feet  No polydipsia  No polyuria No visual disturbances   No vomiting     Home blood sugar records: fasting range: 110's  Episodes of hypoglycemia? Yes 60's in the AM and evenings.    Most Recent Eye Exam: every 6 months ago. Goes back in February. Cynthia Sims  Follows with Urology, Dr. MaMatilde Sprangreports that she has to catheterize herself 3-4 times per day because of urinary retention, reports having an infection one month ago   ** increase losartan 50 to twice daily   Pertinent Labs: Lab Results  Component Value Date   CHOL 153 09/22/2021   HDL 62 09/22/2021   LDLCALC 63 09/22/2021   TRIG 165 (H) 09/22/2021   CHOLHDL 2.5 09/22/2021   Lab Results  Component Value Date   NA 141 09/22/2021   K 3.9 09/22/2021   CREATININE 1.29 (H) 09/22/2021    EGFR 44 (L) 09/22/2021   MICROALBUR 50 08/08/2018   LABMICR See below: 04/24/2022     ---------------------------------------------------------------------------------------------------   Medications: Outpatient Medications Prior to Visit  Medication Sig   acetaminophen (TYLENOL) 500 MG tablet Take 500 mg by mouth every 6 (six) hours as needed.    aspirin EC 81 MG tablet Take 81 mg by mouth daily.    carvedilol (COREG) 12.5 MG tablet TAKE 1 TABLET BY MOUTH TWICE DAILY WITH A MEAL   cholestyramine (QUESTRAN) 4 g packet DISSOLVE & TAKE 1 POWDER BY MOUTH THREE TIMES DAILY   cyanocobalamin (,VITAMIN B-12,) 1000 MCG/ML injection Inject into the muscle daily.   DULoxetine (CYMBALTA) 60 MG capsule Take 1 capsule (60 mg total) by mouth daily.   gabapentin (NEURONTIN) 100 MG capsule Take 1 capsule (100 mg total) by mouth 3 (three) times daily.   glipiZIDE (GLUCOTROL) 10 MG tablet Take 1 tablet (10 mg total) by mouth 2 (two) times daily.   glucose blood (ONETOUCH ULTRA) test strip 1 each by Other route 3 (three) times daily. Use as instructed   Insulin Glargine (BASAGLAR KWIKPEN) 100 UNIT/ML INJECT 24 UNITS SUBCUTANEOUSLY ONCE DAILY   Lancets (ONETOUCH DELICA PLUS [id number] USE 1  TO CHECK GLUCOSE ONCE DAILY   lovastatin (MEVACOR) 40 MG tablet Take 1 tablet by mouth once daily   metFORMIN (GLUCOPHAGE) 500 MG tablet Take 1 tablet (500 mg total) by mouth 2 (  two) times daily with a meal.   omeprazole (PRILOSEC) 40 MG capsule Take 1 capsule (40 mg total) by mouth daily.   [DISCONTINUED] losartan (COZAAR) 50 MG tablet Take 1 tablet (50 mg total) by mouth daily.   No facility-administered medications prior to visit.    Review of Systems     Objective    BP (!) 174/80 (BP Location: Left Arm, Patient Position: Sitting, Cuff Size: Large)   Pulse 61   Temp 98.1 F (36.7 C) (Oral)   Resp 16   Wt 193 lb 12.8 oz (87.9 kg)   BMI 36.62 kg/m    Physical Exam Vitals reviewed.   Constitutional:      General: She is not in acute distress.    Appearance: Normal appearance. She is not ill-appearing, toxic-appearing or diaphoretic.  Eyes:     Conjunctiva/sclera: Conjunctivae normal.  Cardiovascular:     Rate and Rhythm: Normal rate and regular rhythm.     Pulses: Normal pulses.     Heart sounds: Normal heart sounds. No murmur heard.    No friction rub. No gallop.  Pulmonary:     Effort: Pulmonary effort is normal. No respiratory distress.     Breath sounds: Normal breath sounds. No stridor. No wheezing, rhonchi or rales.  Abdominal:     General: Bowel sounds are normal. There is no distension.     Palpations: Abdomen is soft.     Tenderness: There is no abdominal tenderness.  Musculoskeletal:     Right lower leg: Edema present.     Left lower leg: Edema present.     Comments: Trace LE edema   Skin:    Findings: No erythema or rash.  Neurological:     Mental Status: She is alert and oriented to person, place, and time.       No results found for any visits on 05/22/22.  Assessment & Plan     Problem List Items Addressed This Visit       Cardiovascular and Mediastinum   Hypertension - Primary    BP elevated in office today, not at goal  Hx of CKD, stage 3  Does not currently see nephrology, follows with urology  She will continue losartan 53m and increase to twice daily dosing  Follow up scheduled for 06/09/22  We will collect urine microalbumin and CMP at that time        Relevant Medications   losartan (COZAAR) 50 MG tablet     Endocrine   Diabetes (HCC)    A1c at goal UTD on A1c  Foot exam completed today  Will collect urine microalbumin at follow up as patient forget sample today  Continue current regimen        Relevant Medications   losartan (COZAAR) 50 MG tablet   Diabetic neuropathy (HCC)   Relevant Medications   losartan (COZAAR) 50 MG tablet   Other Relevant Orders   Urine Albumin/Creatinine with ratio (send out)  [LAB689]   Other Visit Diagnoses     Essential hypertension       Relevant Medications   losartan (COZAAR) 50 MG tablet        Return in about 18 days (around 06/09/2022) for HTN, labs, microalbumin.      I, MEulis Foster MD, have reviewed all documentation for this visit.  Portions of this information were initially documented by the Cynthia Sims and reviewed by me for thoroughness and accuracy.      MEulis Foster MD  [facility]  [phone] (phone) [phone] (fax)  [place]

## 2022-05-22 NOTE — Assessment & Plan Note (Signed)
BP elevated in office today, not at goal  Hx of CKD, stage 3  Does not currently see nephrology, follows with urology  She will continue losartan '50mg'$  and increase to twice daily dosing  Follow up scheduled for 06/09/22  We will collect urine microalbumin and CMP at that time

## 2022-05-22 NOTE — Assessment & Plan Note (Signed)
A1c at goal UTD on A1c  Foot exam completed today  Will collect urine microalbumin at follow up as patient forget sample today  Continue current regimen

## 2022-06-09 ENCOUNTER — Encounter: Payer: Self-pay | Admitting: Family Medicine

## 2022-06-09 ENCOUNTER — Ambulatory Visit
Admission: RE | Admit: 2022-06-09 | Discharge: 2022-06-09 | Disposition: A | Discharge status: Home / Self Care | Payer: Medicare HMO | Source: Ambulatory Visit | Attending: Family Medicine | Admitting: Family Medicine

## 2022-06-09 ENCOUNTER — Ambulatory Visit (INDEPENDENT_AMBULATORY_CARE_PROVIDER_SITE_OTHER): Payer: Medicare HMO | Admitting: Family Medicine

## 2022-06-09 VITALS — BP 157/75 | HR 58 | Temp 97.5°F | Resp 16 | Wt 191.5 lb

## 2022-06-09 DIAGNOSIS — R0602 Shortness of breath: Secondary | ICD-10-CM

## 2022-06-09 DIAGNOSIS — I10 Essential (primary) hypertension: Secondary | ICD-10-CM | POA: Insufficient documentation

## 2022-06-09 DIAGNOSIS — E0842 Diabetes mellitus due to underlying condition with diabetic polyneuropathy: Secondary | ICD-10-CM | POA: Diagnosis not present

## 2022-06-09 DIAGNOSIS — N1831 Chronic kidney disease, stage 3a: Secondary | ICD-10-CM

## 2022-06-09 MED ORDER — LOSARTAN POTASSIUM 100 MG PO TABS: 100.0000 mg | ORAL_TABLET | Freq: Two times a day (BID) | ORAL | 0 refills | Status: DC

## 2022-06-09 NOTE — Progress Notes (Signed)
I,Cynthia Sims,acting as a scribe for [employer], MD.,have documented all relevant documentation on the behalf of Cynthia Ramp, MD,as directed by  Cynthia Ramp, MD while in the presence of Cynthia Ramp, MD.   Established patient visit   [name]   DOB: 05/08/1951   71 y.o. Female  MRN: [id number] Visit Date: 06/09/2022  Today's healthcare provider: [name], MD   Chief Complaint  Patient presents with   Follow-up   Subjective    HPI   Hypertension, follow-up  BP Readings from Last 3 Encounters:  06/09/22 (!) 157/75  05/22/22 (!) 174/80  04/24/22 (!) 143/79   Wt Readings from Last 3 Encounters:  06/09/22 191 lb 8 oz (86.9 kg)  05/22/22 193 lb 12.8 oz (87.9 kg)  04/24/22 193 lb (87.5 kg)     She was last seen for hypertension 2 weeks ago.  BP at that visit was 174/80. Management since that visit includes increased losartan to 50mg  BID in addition to coreg 12.5mg  BID.  She reports excellent compliance with treatment. Family member reports having chest pain and SOB that occurs  Feels that she has chest pain intermittently, chest pain localized to mid sternum  She denies palpitations  Sometimes has LE edema after standing for long periods of time    Outside blood pressures are 135/62-170's/60-70's with one reading of 195/78 and 211/83.  Symptoms: No chest pain No chest pressure (not now but has had some symptoms)   No palpitations No syncope  No dyspnea No orthopnea  No paroxysmal nocturnal dyspnea No lower extremity edema   Pertinent labs Lab Results  Component Value Date   CHOL 153 09/22/2021   HDL 62 09/22/2021   LDLCALC 63 09/22/2021   TRIG 165 (H) 09/22/2021   CHOLHDL 2.5 09/22/2021   Lab Results  Component Value Date   NA 140 06/09/2022   K 4.6 06/09/2022   CREATININE 1.49 (H) 06/09/2022   EGFR 37 (L) 06/09/2022   GLUCOSE 201 (H) 06/09/2022   TSH 2.080 09/22/2021      Patient's BP Machine reads: 170/75 P59     Medications: Outpatient Medications Prior to Visit  Medication Sig   acetaminophen (TYLENOL) 500 MG tablet Take 500 mg by mouth every 6 (six) hours as needed.    aspirin EC 81 MG tablet Take 81 mg by mouth daily.    carvedilol (COREG) 12.5 MG tablet TAKE 1 TABLET BY MOUTH TWICE DAILY WITH A MEAL   cholestyramine (QUESTRAN) 4 g packet DISSOLVE & TAKE 1 POWDER BY MOUTH THREE TIMES DAILY   cyanocobalamin (,VITAMIN B-12,) 1000 MCG/ML injection Inject into the muscle daily.   DULoxetine (CYMBALTA) 60 MG capsule Take 1 capsule (60 mg total) by mouth daily.   gabapentin (NEURONTIN) 100 MG capsule Take 1 capsule (100 mg total) by mouth 3 (three) times daily.   glipiZIDE (GLUCOTROL) 10 MG tablet Take 1 tablet (10 mg total) by mouth 2 (two) times daily.   glucose blood (ONETOUCH ULTRA) test strip 1 each by Other route 3 (three) times daily. Use as instructed   Insulin Glargine (BASAGLAR KWIKPEN) 100 UNIT/ML INJECT 24 UNITS SUBCUTANEOUSLY ONCE DAILY   Lancets (ONETOUCH DELICA PLUS LANCET33G) MISC USE 1  TO CHECK GLUCOSE ONCE DAILY   lovastatin (MEVACOR) 40 MG tablet Take 1 tablet by mouth once daily   metFORMIN (GLUCOPHAGE) 500 MG tablet Take 1 tablet (500 mg total) by mouth 2 (two) times daily with a meal.   omeprazole (PRILOSEC)  40 MG capsule Take 1 capsule (40 mg total) by mouth daily.   [DISCONTINUED] losartan (COZAAR) 50 MG tablet Take 1 tablet (50 mg total) by mouth in the morning and at bedtime.   No facility-administered medications prior to visit.    Review of Systems     Objective    BP (!) 157/75 (BP Location: Right Arm, Patient Position: Sitting, Cuff Size: Large)   Pulse (!) 58   Temp (!) 97.5 F (36.4 C) (Oral)   Resp 16   Wt 191 lb 8 oz (86.9 kg)   BMI 36.18 kg/m    Physical Exam Vitals reviewed.  Constitutional:      General: She is not in acute distress.    Appearance: Normal appearance. She is not ill-appearing,  toxic-appearing or diaphoretic.  Eyes:     Conjunctiva/sclera: Conjunctivae normal.  Cardiovascular:     Rate and Rhythm: Normal rate and regular rhythm.     Pulses: Normal pulses.     Heart sounds: Normal heart sounds. No murmur heard.    No friction rub. No gallop.     Comments: Trace LE edema  Pulmonary:     Effort: Pulmonary effort is normal. No respiratory distress.     Breath sounds: Normal breath sounds. No stridor. No wheezing, rhonchi or rales.  Abdominal:     General: Bowel sounds are normal. There is no distension.     Palpations: Abdomen is soft.     Tenderness: There is no abdominal tenderness.  Musculoskeletal:     Right lower leg: Edema present.     Left lower leg: Edema present.  Skin:    Findings: No erythema or rash.  Neurological:     Mental Status: She is alert and oriented to person, place, and time.       Results for orders placed or performed in visit on 06/09/22  Comprehensive metabolic panel  Result Value Ref Range   Glucose 201 (H) 70 - 99 mg/dL   BUN 24 8 - 27 mg/dL   Creatinine, Ser 8.46 (H) 0.57 - 1.00 mg/dL   eGFR 37 (L) >96 EX/BMW/4.13   BUN/Creatinine Ratio 16 12 - 28   Sodium 140 134 - 144 mmol/L   Potassium 4.6 3.5 - 5.2 mmol/L   Chloride 108 (H) 96 - 106 mmol/L   CO2 17 (L) 20 - 29 mmol/L   Calcium 8.8 8.7 - 10.3 mg/dL   Total Protein 6.8 6.0 - 8.5 g/dL   Albumin 4.2 3.8 - 4.8 g/dL   Globulin, Total 2.6 1.5 - 4.5 g/dL   Albumin/Globulin Ratio 1.6 1.2 - 2.2   Bilirubin Total <0.2 0.0 - 1.2 mg/dL   Alkaline Phosphatase 76 44 - 121 IU/L   AST 12 0 - 40 IU/L   ALT 14 0 - 32 IU/L  Urine Microalbumin w/creat. ratio  Result Value Ref Range   Creatinine, Urine 154.5 Not Estab. mg/dL   Microalbumin, Urine 24.4 Not Estab. ug/mL   Microalb/Creat Ratio 13 0 - 29 mg/g creat  CBC  Result Value Ref Range   WBC 5.4 3.4 - 10.8 x10E3/uL   RBC 3.65 (L) 3.77 - 5.28 x10E6/uL   Hemoglobin 11.3 11.1 - 15.9 g/dL   Hematocrit 01.0 (L) 27.2 - 46.6 %    MCV 92 79 - 97 fL   MCH 31.0 26.6 - 33.0 pg   MCHC 33.8 31.5 - 35.7 g/dL   RDW 53.6 64.4 - 03.4 %   Platelets 146 (L) 150 - 450  x10E3/uL  Brain natriuretic peptide  Result Value Ref Range   BNP 21.9 0.0 - 100.0 pg/mL    Assessment & Plan     Problem List Items Addressed This Visit       Cardiovascular and Mediastinum   Hypertension - Primary   Relevant Medications   losartan (COZAAR) 100 MG tablet   Other Relevant Orders   Comprehensive metabolic panel (Completed)   Essential hypertension    BP elevated on two measurements in office  Recommended increasing losartan to 100mg  twice daily  Follow up in 10 days or next available appt to monitor BP  If unable to tolerate 100mg  losartan twice daily, recommended 150 mg daily of losartan  Patient to continue monitoring and recording BP for next visit  CMP ordered today        Relevant Medications   losartan (COZAAR) 100 MG tablet     Endocrine   Diabetes (HCC)    Last A1c <7 and within goal range  Urine microalbumin collected today, patient self caths for urine output  Continue current regimen for DM control         Relevant Medications   losartan (COZAAR) 100 MG tablet   Other Relevant Orders   Urine Microalbumin w/creat. ratio (Completed)   Ambulatory referral to Podiatry     Genitourinary   CKD (chronic kidney disease) stage 3, GFR 30-59 ml/min (HCC)    CMP collected today       Relevant Orders   Comprehensive metabolic panel (Completed)     Other   SOB (shortness of breath)    Suspect this could be related to CKD, new HF, uncontrolled BP, anemia  Will collect CMP, CBC, BNP, urine microalbumin and chest XR  Follow up in next 10-14 days        Relevant Orders   B Nat Peptide   CBC   DG Chest 2 View     Return in about 10 days (around 06/19/2022) for HTN .     I, Cynthia Ramp, MD, have reviewed all documentation for this visit.  Portions of this information were initially documented by  the CMA and reviewed by me for thoroughness and accuracy.      Cynthia Ramp, MD  [facility] [phone] (phone) [phone] (fax)  [facility]

## 2022-06-09 NOTE — Patient Instructions (Addendum)
Please start with losartan 100 mg twice daily for your blood pressure   If your blood pressure starts becoming too low (for example 90s/60s) and you feel dizzy lightheaded, then scale back to '100mg'$  losartan in the AM and '50mg'$  losartan at night time and let our office know  Please follow up with me in 10 days for blood pressure check   I have ordered a chest xray for your symptoms of shortness of breath.   Please report to [facility] located at:  [place], [place]  You do not need an appointment to have xrays completed.   Our office will follow up with  results once available.  We will also check lab work and notify you of any abnormal results

## 2022-06-10 LAB — MICROALBUMIN / CREATININE URINE RATIO
Creatinine, Urine: 154.5 mg/dL
Microalb/Creat Ratio: 13 mg/g creat (ref 0–29)
Microalbumin, Urine: 20.7 ug/mL

## 2022-06-10 LAB — COMPREHENSIVE METABOLIC PANEL
ALT: 14 IU/L (ref 0–32)
AST: 12 IU/L (ref 0–40)
Albumin/Globulin Ratio: 1.6 (ref 1.2–2.2)
Albumin: 4.2 g/dL (ref 3.8–4.8)
Alkaline Phosphatase: 76 IU/L (ref 44–121)
BUN/Creatinine Ratio: 16 (ref 12–28)
BUN: 24 mg/dL (ref 8–27)
Bilirubin Total: <0.2 mg/dL (ref 0.0–1.2)
CO2: 17 mmol/L — ABNORMAL LOW (ref 20–29)
Calcium: 8.8 mg/dL (ref 8.7–10.3)
Chloride: 108 mmol/L — ABNORMAL HIGH (ref 96–106)
Creatinine, Ser: 1.49 mg/dL — ABNORMAL HIGH (ref 0.57–1.00)
Globulin, Total: 2.6 g/dL (ref 1.5–4.5)
Glucose: 201 mg/dL — ABNORMAL HIGH (ref 70–99)
Potassium: 4.6 mmol/L (ref 3.5–5.2)
Sodium: 140 mmol/L (ref 134–144)
Total Protein: 6.8 g/dL (ref 6.0–8.5)
eGFR: 37 mL/min/{1.73_m2} — ABNORMAL LOW (ref >59)

## 2022-06-10 LAB — CBC
Hematocrit: 33.4 % — ABNORMAL LOW (ref 34.0–46.6)
Hemoglobin: 11.3 g/dL (ref 11.1–15.9)
MCH: 31 pg (ref 26.6–33.0)
MCHC: 33.8 g/dL (ref 31.5–35.7)
MCV: 92 fL (ref 79–97)
Platelets: 146 10*3/uL — ABNORMAL LOW (ref 150–450)
RBC: 3.65 x10E6/uL — ABNORMAL LOW (ref 3.77–5.28)
RDW: 13.7 % (ref 11.7–15.4)
WBC: 5.4 10*3/uL (ref 3.4–10.8)

## 2022-06-10 LAB — BRAIN NATRIURETIC PEPTIDE: BNP: 21.9 pg/mL (ref 0.0–100.0)

## 2022-06-12 NOTE — Assessment & Plan Note (Signed)
Suspect this could be related to CKD, new HF, uncontrolled BP, anemia  Will collect CMP, CBC, BNP, urine microalbumin and chest XR  Follow up in next 10-14 days

## 2022-06-12 NOTE — Assessment & Plan Note (Addendum)
BP elevated on two measurements in office  Recommended increasing losartan to '100mg'$  twice daily  Follow up in 10 days or next available appt to monitor BP  If unable to tolerate '100mg'$  losartan twice daily, recommended 150 mg daily of losartan  Patient to continue monitoring and recording BP for next visit  CMP ordered today

## 2022-06-12 NOTE — Assessment & Plan Note (Signed)
Last A1c <7 and within goal range  Urine microalbumin collected today, patient self caths for urine output  Continue current regimen for DM control

## 2022-06-12 NOTE — Assessment & Plan Note (Signed)
CMP collected today. 

## 2022-06-20 ENCOUNTER — Ambulatory Visit: Payer: Self-pay | Admitting: Podiatry

## 2022-06-22 NOTE — Progress Notes (Signed)
 I,[name],acting as a scribe for [name], MD.,have documented all relevant documentation on the behalf of [name], MD,as directed by  [name], MD while in the presence of [name], MD.   Established patient visit   [name]   DOB: [birth year]   72 y.o. Female  MRN: [id number] Visit Date: 06/23/2022  Today's healthcare provider: [name], MD   Chief Complaint  Patient presents with   Follow-Up HTN   Subjective    HPI   Hypertension, follow-up  BP Readings from Last 3 Encounters:  06/23/22 125/64  06/09/22 (!) 157/75  05/22/22 (!) 174/80   Wt Readings from Last 3 Encounters:  06/23/22 188 lb 8 oz (85.5 kg)  06/09/22 191 lb 8 oz (86.9 kg)  05/22/22 193 lb 12.8 oz (87.9 kg)     She was last seen for hypertension 10 days ago.  BP at that visit was 157/75. Management since that visit includes increase losartan 100mg  twice daily.  She reports excellent compliance with treatment. She is not having side effects.   Patient tested positve for COVID three days ago and has been feeling weakness  Outside blood pressures are 160-170/50-60's Symptoms: Yes chest pain-off and on. Had one episode of chest pain yesterday  that lasted yesterday. No chest pressure  No palpitations No syncope  No dyspnea No orthopnea  No paroxysmal nocturnal dyspnea No lower extremity edema   Pertinent labs Lab Results  Component Value Date   CHOL 153 09/22/2021   HDL 62 09/22/2021   LDLCALC 63 09/22/2021   TRIG 165 (H) 09/22/2021   CHOLHDL 2.5 09/22/2021   Lab Results  Component Value Date   NA 140 06/09/2022   K 4.6 06/09/2022   CREATININE 1.49 (H) 06/09/2022   EGFR 37 (L) 06/09/2022   GLUCOSE 201 (H) 06/09/2022   TSH 2.080 09/22/2021     The 10-year ASCVD risk score (Arnett DK, et al., 2019) is:  28%  ---------------------------------------------------------------------------------------------------  SOB  Patient was evaluated for DOE BNP was within normal limits, CXR showed no abnormalities, CMP was notable for elevated creatinine consistent with CKD  Today she reports continued episodes of SOB and DOE  She is agreeable to cardiology evaluation    Health Updates  Patient had two positive home tests for COVID 3 days ago, reports that symptoms are improving but still has some congestion and feels tired  Patient requests handicap placard form today    Medications: Outpatient Medications Prior to Visit  Medication Sig   acetaminophen (TYLENOL) 500 MG tablet Take 500 mg by mouth every 6 (six) hours as needed.    aspirin EC 81 MG tablet Take 81 mg by mouth daily.    carvedilol (COREG) 12.5 MG tablet TAKE 1 TABLET BY MOUTH TWICE DAILY WITH A MEAL   cholestyramine (QUESTRAN) 4 g packet DISSOLVE & TAKE 1 POWDER BY MOUTH THREE TIMES DAILY   cyanocobalamin (,VITAMIN B-12,) 1000 MCG/ML injection Inject into the muscle daily.   DULoxetine (CYMBALTA) 60 MG capsule Take 1 capsule (60 mg total) by mouth daily.   gabapentin (NEURONTIN) 100 MG capsule Take 1 capsule (100 mg total) by mouth 3 (three) times daily.   glipiZIDE (GLUCOTROL) 10 MG tablet Take 1 tablet (10 mg total) by mouth 2 (two) times daily.   glucose blood (ONETOUCH ULTRA) test strip 1 each by Other route 3 (three) times daily. Use as instructed   Insulin Glargine (BASAGLAR KWIKPEN) 100 UNIT/ML INJECT 24 UNITS  SUBCUTANEOUSLY ONCE DAILY   Lancets (ONETOUCH DELICA PLUS LANCET33G) MISC USE 1  TO CHECK GLUCOSE ONCE DAILY   lovastatin (MEVACOR) 40 MG tablet Take 1 tablet by mouth once daily   metFORMIN (GLUCOPHAGE) 500 MG tablet Take 1 tablet (500 mg total) by mouth 2 (two) times daily with a meal.   omeprazole (PRILOSEC) 40 MG capsule Take 1 capsule (40 mg total) by mouth daily.   [DISCONTINUED] losartan (COZAAR) 100 MG tablet Take  1 tablet (100 mg total) by mouth 2 (two) times daily.   No facility-administered medications prior to visit.    Review of Systems     Objective    BP 125/64 (BP Location: Right Arm, Patient Position: Sitting, Cuff Size: Normal)   Pulse (!) 58   Resp 16   Wt 188 lb 8 oz (85.5 kg)   SpO2 100%   BMI 35.62 kg/m    Physical Exam Vitals reviewed.  Constitutional:      General: She is not in acute distress.    Appearance: Normal appearance. She is not ill-appearing, toxic-appearing or diaphoretic.  Eyes:     Conjunctiva/sclera: Conjunctivae normal.  Cardiovascular:     Rate and Rhythm: Normal rate and regular rhythm.     Pulses: Normal pulses.     Heart sounds: Normal heart sounds. No murmur heard.    No friction rub. No gallop.  Pulmonary:     Effort: Pulmonary effort is normal. No respiratory distress.     Breath sounds: Normal breath sounds. No stridor. No wheezing, rhonchi or rales.  Chest:     Comments: Chest pain is not reproducible with palpation of the sternum  Abdominal:     General: Bowel sounds are normal. There is no distension.     Palpations: Abdomen is soft.     Tenderness: There is no abdominal tenderness.  Musculoskeletal:     Right lower leg: No edema.     Left lower leg: No edema.  Skin:    Findings: No erythema or rash.  Neurological:     Mental Status: She is alert and oriented to person, place, and time.     No results found for any visits on 06/23/22.  Assessment & Plan     Problem List Items Addressed This Visit       Cardiovascular and Mediastinum   Hypertension   Relevant Medications   losartan (COZAAR) 100 MG tablet   Essential hypertension    BP tolerated 100mg  twice daily for losartan  Requested to resend prescription  BP within goal range today        Relevant Medications   losartan (COZAAR) 100 MG tablet     Other   SOB (shortness of breath) - Primary    Symptoms present prior to +COVID 19 tests  Normal CXR and BNP at  prior visit  Will refer to cardiology for further evaluation given concern for dyspnea on exertion and substernal chest pain       Relevant Orders   Ambulatory referral to Cardiology     Return in about 2 months (around 08/22/2022) for chest pain, sob.      I, [name], MD, have reviewed all documentation for this visit.  Portions of this information were initially documented by the CMA and reviewed by me for thoroughness and accuracy.      [name], MD  [facility] [phone] (phone) [phone] (fax)  [facility]

## 2022-06-23 ENCOUNTER — Encounter: Payer: Self-pay | Admitting: Family Medicine

## 2022-06-23 ENCOUNTER — Ambulatory Visit (INDEPENDENT_AMBULATORY_CARE_PROVIDER_SITE_OTHER): Payer: Medicare HMO | Admitting: Family Medicine

## 2022-06-23 VITALS — BP 125/64 | HR 58 | Resp 16 | Wt 188.5 lb

## 2022-06-23 DIAGNOSIS — R0602 Shortness of breath: Secondary | ICD-10-CM

## 2022-06-23 DIAGNOSIS — I10 Essential (primary) hypertension: Secondary | ICD-10-CM

## 2022-06-23 MED ORDER — LOSARTAN POTASSIUM 100 MG PO TABS: 100.0000 mg | ORAL_TABLET | Freq: Two times a day (BID) | ORAL | 1 refills | Status: DC

## 2022-06-23 NOTE — Patient Instructions (Signed)
Today we submitted a prescription for losartan 100 mg twice daily.  Please continue this medication and monitoring your blood pressure.  I have submitted a referral to cardiology for evaluation.  Please be on look out for a call to schedule an appointment.  Please notify office if you do not hear anything in the next 2 weeks.

## 2022-06-23 NOTE — Assessment & Plan Note (Addendum)
BP tolerated 100mg  twice daily for losartan  Requested to resend prescription  BP within goal range today

## 2022-06-25 ENCOUNTER — Encounter: Payer: Self-pay | Admitting: Family Medicine

## 2022-06-25 NOTE — Assessment & Plan Note (Signed)
Symptoms present prior to +COVID 19 tests  Normal CXR and BNP at prior visit  Will refer to cardiology for further evaluation given concern for dyspnea on exertion and substernal chest pain

## 2022-06-27 ENCOUNTER — Other Ambulatory Visit: Payer: Self-pay | Admitting: Physician Assistant

## 2022-06-27 DIAGNOSIS — R197 Diarrhea, unspecified: Secondary | ICD-10-CM

## 2022-06-27 NOTE — Telephone Encounter (Signed)
Requested Prescriptions  Pending Prescriptions Disp Refills   cholestyramine (QUESTRAN) 4 g packet [Pharmacy Med Name: Cholestyramine 4 GM Oral Packet] 60 each 0    Sig: DISSOLVE AND TAKE ONE PACKET BY MOUTH THREE TIMES DAILY     Cardiovascular:  Antilipid - Bile Acid Sequestrants Failed - 06/27/2022  9:33 AM      Failed - Lipid Panel in normal range within the last 12 months    Cholesterol, Total  Date Value Ref Range Status  09/22/2021 153 100 - 199 mg/dL Final   LDL Chol Calc (NIH)  Date Value Ref Range Status  09/22/2021 63 0 - 99 mg/dL Final   HDL  Date Value Ref Range Status  09/22/2021 62 >39 mg/dL Final   Triglycerides  Date Value Ref Range Status  09/22/2021 165 (H) 0 - 149 mg/dL Final         Passed - Valid encounter within last 12 months    Recent Outpatient Visits           4 days ago SOB (shortness of breath)   [facility] Simmons-Robinson, [place], MD   2 weeks ago Primary hypertension   [employer], [place], MD   1 month ago Primary hypertension   [place], Financial risk analyst, MD   4 months ago Type 2 diabetes mellitus with stage 3 chronic kidney disease, without long-term current use of insulin, unspecified whether stage 3a or 3b CKD [facility])   [place] Jerrol Banana., MD   9 months ago Encounter for [employer] annual wellness exam   [facility] Rosanna Randy, Retia Passe., MD       Future Appointments             In 2 days Turner, Eber Hong, MD [facility] [place]   In 1 week Amalia Hailey, Dorathy Daft, DPM [facility] [place], [place]   In 1 month Simmons-Robinson, Riki Sheer, MD [facility], [place]

## 2022-06-29 ENCOUNTER — Ambulatory Visit: Payer: Medicare HMO | Admitting: Cardiology

## 2022-07-04 ENCOUNTER — Ambulatory Visit: Payer: Medicare HMO | Admitting: Podiatry

## 2022-07-04 DIAGNOSIS — E0843 Diabetes mellitus due to underlying condition with diabetic autonomic (poly)neuropathy: Secondary | ICD-10-CM

## 2022-07-04 DIAGNOSIS — M79674 Pain in right toe(s): Secondary | ICD-10-CM

## 2022-07-04 DIAGNOSIS — M79675 Pain in left toe(s): Secondary | ICD-10-CM

## 2022-07-04 DIAGNOSIS — B351 Tinea unguium: Secondary | ICD-10-CM

## 2022-07-04 NOTE — Progress Notes (Signed)
   Chief Complaint  Patient presents with   Diabetes    DIABETIC FOOT CARE, A1C-6.0 BG-109, NAIL TRIM AND DRY SKIN     SUBJECTIVE Patient with a history of diabetes mellitus presents to office today complaining of elongated, thickened nails that cause pain while ambulating in shoes.  Patient is unable to trim their own nails. Patient is here for further evaluation and treatment.  Past Medical History:  Diagnosis Date   ALT (SGPT) level raised    Anemia    Anxiety    Arthritis    Depression    Diabetes mellitus without complication (HCC)    Elevated cholesterol    GERD (gastroesophageal reflux disease)    Hemorrhoids    Hypertension    Kidney disease     No Known Allergies   OBJECTIVE General Patient is awake, alert, and oriented x 3 and in no acute distress. Derm Skin is dry with superficial fissures throughout the skin and slight hyperkeratosis.  Nails are tender, long, thickened and dystrophic with subungual debris, consistent with onychomycosis, 1-5 bilateral. No signs of infection noted. Vasc  DP and PT pedal pulses palpable bilaterally. Temperature gradient within normal limits.  Neuro Epicritic and protective threshold sensation diminished bilaterally.  Musculoskeletal Exam No symptomatic pedal deformities noted bilateral. Muscular strength within normal limits.  ASSESSMENT 1. Diabetes Mellitus w/ peripheral neuropathy 2.  Pain due to onychomycosis of toenails bilateral  PLAN OF CARE 1. Patient evaluated today. Comprehensive diabetic foot exam performed today 2. Instructed to maintain good pedal hygiene and foot care. Stressed importance of controlling blood sugar.  3. Mechanical debridement of nails 1-5 bilaterally performed using a nail nipper. Filed with dremel without incident.  4.  Patient admits to going barefoot throughout the day.  Advised against going barefoot.  Recommend good supportive shoes and sneakers with daily lotion  5.  Return to clinic in 3 mos.      Edrick Kins, DPM [facility]  Dr. Edrick Kins, DPM    [address], [place] [address]                Office [phone]  Fax [phone]

## 2022-07-06 ENCOUNTER — Other Ambulatory Visit: Payer: Self-pay | Admitting: Family Medicine

## 2022-07-06 DIAGNOSIS — I10 Essential (primary) hypertension: Secondary | ICD-10-CM

## 2022-07-12 NOTE — Progress Notes (Signed)
Cardiology Office Note:    Date:  07/12/2022   ID:  Cynthia Sims, DOB 20-Apr-1951, MRN [id number]  PCP:  Eulis Foster, [place] Providers Cardiologist:  None { Click to update primary MD,subspecialty MD or APP then REFRESH:1}    Referring MD: Cynthia Moat*   CC: *** Consulted for the evaluation of SOB at the behest of Dr. Rosita Fire- Quentin Cornwall   History of Present Illness:    Cynthia Sims is a 72 y.o. female with a hx of HTN with DM, OSA, CKD stage IIIa, HLD who presents with SOB.  Patient notes that she is feeling ***.   Was last feeling well ***. Able to ***  Has had no chest pain, chest pressure, chest tightness, chest stinging ***.  Discomfort occurs with ***, worsens with ***, and improves with ***.    Patient exertion notable for *** with *** and feels no symptoms.    No shortness of breath, DOE ***.  No PND or orthopnea***.  No weight gain***, leg swelling ***, or abdominal swelling***.  No syncope or near syncope ***. Notes *** no palpitations or funny heart beats.     Patient reports prior cardiac testing including ***  No history of ***pre-eclampsia, gestation HTN or gestational DM.  No Fen-Phen or drug use***.  Ambulatory BP ***.   Past Medical History:  Diagnosis Date   ALT (SGPT) level raised    Anemia    Anxiety    Arthritis    Depression    Diabetes mellitus without complication (HCC)    Elevated cholesterol    GERD (gastroesophageal reflux disease)    Hemorrhoids    Hypertension    Kidney disease     Past Surgical History:  Procedure Laterality Date   abdominal blockage     CESAREAN SECTION  1983   CESAREAN SECTION     CHOLECYSTECTOMY     COLONOSCOPY WITH PROPOFOL N/A 10/29/2015   Procedure: COLONOSCOPY WITH PROPOFOL;  Surgeon: Manya Silvas, MD;  Location: [place];  Service: Endoscopy;  Laterality: N/A;   COLONOSCOPY WITH PROPOFOL N/A 11/07/2017   Procedure: COLONOSCOPY WITH PROPOFOL;   Surgeon: Toledo, Benay Pike, MD;  Location: [facility] ENDOSCOPY;  Service: Gastroenterology;  Laterality: N/A;   DILATION AND CURETTAGE OF UTERUS  2001   ESOPHAGOGASTRODUODENOSCOPY (EGD) WITH PROPOFOL N/A 10/29/2015   Procedure: ESOPHAGOGASTRODUODENOSCOPY (EGD) WITH PROPOFOL;  Surgeon: Manya Silvas, MD;  Location[facility] ENDOSCOPY;  Service: Endoscopy;  Laterality: N/A;    Current Medications: No outpatient medications have been marked as taking for the 07/13/22 encounter (Appointment) with Werner Lean, MD.     Allergies:   Patient has no known allergies.   Social History   Socioeconomic History   Marital status: Married    Spouse name: Not on file   Number of children: 5   Years of education: Not on file   Highest education level: 8th grade  Occupational History   Occupation: retired  Tobacco Use   Smoking status: Never    Passive exposure: Never   Smokeless tobacco: Never  Vaping Use   Vaping Use: Never used  Substance and Sexual Activity   Alcohol use: No    Alcohol/week: 0.0 standard drinks of alcohol   Drug use: No   Sexual activity: Yes    Birth control/protection: Post-menopausal  Other Topics Concern   Not on file  Social History Narrative   Applied for food stamps. Pt is living off social security money and  is raising one of her grandchildren.    Social Determinants of Health   Financial Resource Strain: Low Risk  (09/17/2019)   Overall Financial Resource Strain (CARDIA)    Difficulty of Paying Living Expenses: Not hard at all  Food Insecurity: No Food Insecurity (09/17/2019)   Hunger Vital Sign    Worried About Running Out of Food in the Last Year: Never true    Ran Out of Food in the Last Year: Never true  Transportation Needs: No Transportation Needs (09/17/2019)   PRAPARE - Hydrologist (Medical): No    Lack of Transportation (Non-Medical): No  Physical Activity: Inactive (09/17/2019)   Exercise Vital Sign    Days of Exercise  per Week: 0 days    Minutes of Exercise per Session: 0 min  Stress: No Stress Concern Present (09/17/2019)   [place]    Feeling of Stress : Not at all  Social Connections: Moderately Isolated (09/17/2019)   Social Connection and Isolation Panel [NHANES]    Frequency of Communication with Friends and Family: More than three times a week    Frequency of Social Gatherings with Friends and Family: Once a week    Attends Religious Services: Never    Marine scientist or Organizations: No    Attends Music therapist: Never    Marital Status: Married     Family History: The patient's ***family history includes Alzheimer's disease in her mother; Breast cancer (age of onset: 67) in her mother; COPD in her maternal grandmother and mother; Diabetes in her father, paternal grandfather, sister, sister, and sister; Heart attack in her father; Hypertension in her father; Lupus in her father; Osteoporosis in her mother.  ROS:   Please see the history of present illness.    *** All other systems reviewed and are negative.  EKGs/Labs/Other Studies Reviewed:    The following studies were reviewed today: ***  EKG:  EKG is *** ordered today.  The ekg ordered today demonstrates *** 07/13/22: ***       Recent Labs: 09/22/2021: TSH 2.080 06/09/2022: ALT 14; BNP 21.9; BUN 24; Creatinine, Ser 1.49; Hemoglobin 11.3; Platelets 146; Potassium 4.6; Sodium 140  Recent Lipid Panel    Component Value Date/Time   CHOL 153 09/22/2021 1121   TRIG 165 (H) 09/22/2021 1121   HDL 62 09/22/2021 1121   CHOLHDL 2.5 09/22/2021 1121   LDLCALC 63 09/22/2021 1121     Risk Assessment/Calculations:   {Does this patient have ATRIAL FIBRILLATION?[phone]}  No BP recorded.  {Refresh Note OR Click here to enter BP  :1}***         Physical Exam:    VS:  There were no vitals taken for this visit.    Wt Readings from Last 3  Encounters:  06/23/22 188 lb 8 oz (85.5 kg)  06/09/22 191 lb 8 oz (86.9 kg)  05/22/22 193 lb 12.8 oz (87.9 kg)     GEN: *** Well nourished, well developed in no acute distress HEENT: Normal NECK: No JVD; No carotid bruits LYMPHATICS: No lymphadenopathy CARDIAC: ***RRR, no murmurs, rubs, gallops RESPIRATORY:  Clear to auscultation without rales, wheezing or rhonchi  ABDOMEN: Soft, non-tender, non-distended MUSCULOSKELETAL:  No edema; No deformity  SKIN: Warm and dry NEUROLOGIC:  Alert and oriented x 3 PSYCHIATRIC:  Normal affect   ASSESSMENT:    No diagnosis found. PLAN:    SOB HTN with DM OSA - Echo  HLD      {Are you ordering a CV Procedure (e.g. stress test, cath, DCCV, TEE, etc)?   Press F2        :[id number]}    Medication Adjustments/Labs and Tests Ordered: Current medicines are reviewed at length with the patient today.  Concerns regarding medicines are outlined above.  No orders of the defined types were placed in this encounter.  No orders of the defined types were placed in this encounter.   There are no Patient Instructions on file for this visit.   Signed, Werner Lean, MD  07/12/2022 9:03 AM    [place]

## 2022-07-13 ENCOUNTER — Ambulatory Visit: Payer: Medicare HMO | Attending: Cardiology | Admitting: Internal Medicine

## 2022-07-13 ENCOUNTER — Encounter: Payer: Self-pay | Admitting: Internal Medicine

## 2022-07-13 VITALS — BP 138/60 | HR 57 | Ht 61.0 in | Wt 192.0 lb

## 2022-07-13 DIAGNOSIS — I10 Essential (primary) hypertension: Secondary | ICD-10-CM

## 2022-07-13 DIAGNOSIS — R0602 Shortness of breath: Secondary | ICD-10-CM | POA: Diagnosis not present

## 2022-07-13 DIAGNOSIS — R072 Precordial pain: Secondary | ICD-10-CM

## 2022-07-13 MED ORDER — IRBESARTAN 300 MG PO TABS: 300.0000 mg | ORAL_TABLET | Freq: Every day | ORAL | 3 refills | Status: DC

## 2022-07-13 MED ORDER — NITROGLYCERIN 0.4 MG SL SUBL: 0.4000 mg | SUBLINGUAL_TABLET | SUBLINGUAL | 3 refills | Status: AC | PRN

## 2022-07-13 MED ORDER — METOPROLOL TARTRATE 25 MG PO TABS: ORAL_TABLET | ORAL | 0 refills | Status: DC

## 2022-07-13 NOTE — Patient Instructions (Addendum)
Medication Instructions:  Your physician has recommended you make the following change in your medication:  STOP: Losartan START: irbesartan (Avapro) 300 mg by mouth once daily  START: Nitroglycerin 0.4 mg under your tongue every 5 min as needed for Chest Pain   If you have chest pain place 1 tablet under your tongue, wait 5 min If chest pain continues place a 2nd tablet under your tongue, wait 5 min If chest pain continues place a 3rd tablet under your tongue, wait 5 min If chest pain continues call 911    *If you need a refill on your cardiac medications before your next appointment, please call your pharmacy*   Lab Work: IN 1-2 WEEKS: BMP  If you have labs (blood work) drawn today and your tests are completely normal, you will receive your results only by: [place] (if you have MyChart) OR A paper copy in the mail If you have any lab test that is abnormal or we need to change your treatment, we will call you to review the results.   Testing/Procedures: Your physician has requested that you have an echocardiogram. Echocardiography is a painless test that uses sound waves to create images of your heart. It provides your doctor with information about the size and shape of your heart and how well your heart's chambers and valves are working. This procedure takes approximately one hour. There are no restrictions for this procedure. Please do NOT wear cologne, perfume, aftershave, or lotions (deodorant is allowed). Please arrive 15 minutes prior to your appointment time.  Your physician has requested that you have cardiac CT. Cardiac computed tomography (CT) is a painless test that uses an x-ray machine to take clear, detailed pictures of your heart. For further information please visit [email or web address][facility][email or web address]. Please follow instruction sheet as given.    Your physician has requested that you monitor your Blood pressure and keep a BP log.    Follow-Up: At [facility],  you and your health needs are our priority.  As part of our continuing mission to provide you with exceptional heart care, we have created designated Provider Care Teams.  These Care Teams include your primary Cardiologist (physician) and Advanced Practice Providers (APPs -  Physician Assistants and Nurse Practitioners) who all work together to provide you with the care you need, when you need it.   Your next appointment:   2-3 month(s)  Provider:   Werner Lean, MD  or Nicholes Rough, PA-C, Ambrose Pancoast, NP, or Christen Bame, NP       Other Instructions   Your cardiac CT will be scheduled at one of the below locations:   [facility] [address] [place], [place] [address] [phone]  [place] [address] [place], [place] [address] [phone]  [facility] [place], [place] [address] [phone]  If scheduled at [facility], please arrive at the [facility] and Children's Entrance (Entrance C2) of [facility] 30 minutes prior to test start time. You can use the FREE valet parking offered at entrance C (encouraged to control the heart rate for the test)  Proceed to the [facility] Radiology Department (first floor) to check-in and test prep.  All radiology patients and guests should use entrance C2 at [facility], accessed from [facility], even though the hospital's physical address listed is [address]    If scheduled  at [facility] or [facility], please arrive 15 mins early for check-in and test prep.   Please follow these instructions carefully (unless otherwise directed):  On the Night Before the Test: Be sure to Drink plenty of water. Do not consume any caffeinated/decaffeinated beverages or chocolate 12 hours prior to your test. Do not take any  antihistamines 12 hours prior to your test.   On the Day of the Test: Drink plenty of water until 1 hour prior to the test. Do not eat any food 1 hour prior to test. You may take your regular medications prior to the test.  Take metoprolol (Lopressor) 25 mg  two hours prior to test. HOLD Furosemide/Hydrochlorothiazide morning of the test. FEMALES- please wear underwire-free bra if available, avoid dresses & tight clothing       After the Test: Drink plenty of water. After receiving IV contrast, you may experience a mild flushed feeling. This is normal. On occasion, you may experience a mild rash up to 24 hours after the test. This is not dangerous. If this occurs, you can take Benadryl 25 mg and increase your fluid intake. If you experience trouble breathing, this can be serious. If it is severe call 911 IMMEDIATELY. If it is mild, please call our office. If you take any of these medications: Glipizide/Metformin, Avandament, Glucavance, please do not take 48 hours after completing test unless otherwise instructed.  We will call to schedule your test 2-4 weeks out understanding that some insurance companies will need an authorization prior to the service being performed.   For non-scheduling related questions, please contact the cardiac imaging nurse navigator should you have any questions/concerns: Marchia Bond, Cardiac Imaging Nurse Navigator Gordy Clement, Cardiac Imaging Nurse Navigator [facility] Direct Office Dial: [phone]   For scheduling needs, including cancellations and rescheduling, please call [place], [phone].

## 2022-07-17 DIAGNOSIS — K219 Gastro-esophageal reflux disease without esophagitis: Secondary | ICD-10-CM | POA: Diagnosis not present

## 2022-07-17 DIAGNOSIS — K589 Irritable bowel syndrome without diarrhea: Secondary | ICD-10-CM | POA: Diagnosis not present

## 2022-07-17 DIAGNOSIS — Z794 Long term (current) use of insulin: Secondary | ICD-10-CM | POA: Diagnosis not present

## 2022-07-17 DIAGNOSIS — I129 Hypertensive chronic kidney disease with stage 1 through stage 4 chronic kidney disease, or unspecified chronic kidney disease: Secondary | ICD-10-CM | POA: Diagnosis not present

## 2022-07-17 DIAGNOSIS — Z008 Encounter for other general examination: Secondary | ICD-10-CM | POA: Diagnosis not present

## 2022-07-17 DIAGNOSIS — N1832 Chronic kidney disease, stage 3b: Secondary | ICD-10-CM | POA: Diagnosis not present

## 2022-07-17 DIAGNOSIS — R69 Illness, unspecified: Secondary | ICD-10-CM | POA: Diagnosis not present

## 2022-07-17 DIAGNOSIS — F324 Major depressive disorder, single episode, in partial remission: Secondary | ICD-10-CM | POA: Diagnosis not present

## 2022-07-17 DIAGNOSIS — I25119 Atherosclerotic heart disease of native coronary artery with unspecified angina pectoris: Secondary | ICD-10-CM | POA: Diagnosis not present

## 2022-07-17 DIAGNOSIS — E785 Hyperlipidemia, unspecified: Secondary | ICD-10-CM | POA: Diagnosis not present

## 2022-07-17 DIAGNOSIS — R32 Unspecified urinary incontinence: Secondary | ICD-10-CM | POA: Diagnosis not present

## 2022-07-17 DIAGNOSIS — E1151 Type 2 diabetes mellitus with diabetic peripheral angiopathy without gangrene: Secondary | ICD-10-CM | POA: Diagnosis not present

## 2022-07-19 ENCOUNTER — Ambulatory Visit (HOSPITAL_COMMUNITY): Payer: Medicare HMO

## 2022-07-20 ENCOUNTER — Other Ambulatory Visit
Admission: RE | Admit: 2022-07-20 | Discharge: 2022-07-20 | Disposition: A | Discharge status: Home / Self Care | Payer: Medicare HMO | Attending: Internal Medicine | Admitting: Internal Medicine

## 2022-07-20 DIAGNOSIS — I10 Essential (primary) hypertension: Secondary | ICD-10-CM | POA: Insufficient documentation

## 2022-07-20 DIAGNOSIS — R0602 Shortness of breath: Secondary | ICD-10-CM | POA: Insufficient documentation

## 2022-07-20 LAB — BASIC METABOLIC PANEL
Anion gap: 9 (ref 5–15)
BUN: 24 mg/dL — ABNORMAL HIGH (ref 8–23)
CO2: 22 mmol/L (ref 22–32)
Calcium: 8.3 mg/dL — ABNORMAL LOW (ref 8.9–10.3)
Chloride: 104 mmol/L (ref 98–111)
Creatinine, Ser: 1.39 mg/dL — ABNORMAL HIGH (ref 0.44–1.00)
GFR, Estimated: 40 mL/min — ABNORMAL LOW (ref >60)
Glucose, Bld: 82 mg/dL (ref 70–99)
Potassium: 4.5 mmol/L (ref 3.5–5.1)
Sodium: 135 mmol/L (ref 135–145)

## 2022-07-20 NOTE — Addendum Note (Signed)
Addended by: Antonieta Iba C on: 07/20/2022 11:09 AM   Modules accepted: Orders

## 2022-07-25 ENCOUNTER — Other Ambulatory Visit: Payer: Self-pay | Admitting: Family Medicine

## 2022-07-25 NOTE — Telephone Encounter (Signed)
Medication Refill - Medication: glucose blood (ONETOUCH ULTRA) test strip   Has the patient contacted their pharmacy? Yes.    Pt referred to PCP. Pt is out of test strips.  Preferred Pharmacy (with phone number or street name):  [place], [place] Phone: [phone]  Fax: [phone]     Has the patient been seen for an appointment in the last year OR does the patient have an upcoming appointment? Yes.    Agent: Please be advised that RX refills may take up to 3 business days. We ask that you follow-up with your pharmacy.

## 2022-07-26 MED ORDER — ONETOUCH ULTRA VI STRP: 1.0000 | ORAL_STRIP | Freq: Three times a day (TID) | 2 refills | Status: DC

## 2022-07-26 NOTE — Telephone Encounter (Signed)
Requested Prescriptions  Pending Prescriptions Disp Refills   glucose blood (ONETOUCH ULTRA) test strip 100 each 2    Sig: 1 each by Other route 3 (three) times daily. Use as instructed     Endocrinology: Diabetes - Testing Supplies Passed - 07/25/2022  9:56 AM      Passed - Valid encounter within last 12 months    Recent Outpatient Visits           1 month ago SOB (shortness of breath)   [place] Simmons-Robinson, [place], MD   1 month ago Primary hypertension   [place], [place], MD   2 months ago Primary hypertension   [place], [place], MD   5 months ago Type 2 diabetes mellitus with stage 3 chronic kidney disease, without long-term current use of insulin, unspecified whether stage 3a or 3b CKD ([place])   [place] Eulas Post, MD   10 months ago Encounter for [employer] annual wellness exam   [facility] Eulas Post, MD       Future Appointments             In 4 weeks Simmons-Robinson, Riki Sheer, MD [facility], [place]   In 1 month [place], Terance Hart, PA-C [employer] [facility] [employer], [employer]

## 2022-08-04 ENCOUNTER — Telehealth (HOSPITAL_COMMUNITY): Payer: Self-pay | Admitting: Emergency Medicine

## 2022-08-04 NOTE — Telephone Encounter (Signed)
Reaching out to patient to offer assistance regarding upcoming cardiac imaging study; pt verbalizes understanding of appt date/time, parking situation and where to check in, pre-test NPO status and medications ordered, and verified current allergies; name and call back number provided for further questions should they arise Cynthia Bond RN Navigator Cardiac Imaging [facility] [phone] office [phone] cell  Arrival [facility] '25mg'$  metoprolol tartrate Difficult IV  Aware contrast/nitro

## 2022-08-07 ENCOUNTER — Ambulatory Visit
Admission: RE | Admit: 2022-08-07 | Discharge: 2022-08-07 | Disposition: A | Discharge status: Home / Self Care | Payer: Medicare HMO | Source: Ambulatory Visit | Attending: Internal Medicine | Admitting: Internal Medicine

## 2022-08-07 ENCOUNTER — Other Ambulatory Visit: Payer: Self-pay | Admitting: Internal Medicine

## 2022-08-07 ENCOUNTER — Ambulatory Visit: Admission: RE | Admit: 2022-08-07 | Payer: Medicare HMO | Source: Ambulatory Visit

## 2022-08-07 DIAGNOSIS — R931 Abnormal findings on diagnostic imaging of heart and coronary circulation: Secondary | ICD-10-CM | POA: Insufficient documentation

## 2022-08-07 DIAGNOSIS — I2510 Atherosclerotic heart disease of native coronary artery without angina pectoris: Secondary | ICD-10-CM | POA: Diagnosis not present

## 2022-08-07 DIAGNOSIS — R072 Precordial pain: Secondary | ICD-10-CM | POA: Diagnosis present

## 2022-08-07 MED ORDER — IOHEXOL 350 MG/ML SOLN
75.0000 mL | Freq: Once | INTRAVENOUS | Status: AC | PRN
  Administered 2022-08-07: 75 mL via INTRAVENOUS

## 2022-08-07 MED ORDER — NITROGLYCERIN 0.4 MG SL SUBL
0.8000 mg | SUBLINGUAL_TABLET | Freq: Once | SUBLINGUAL | Status: AC
  Administered 2022-08-07: 0.8 mg via SUBLINGUAL

## 2022-08-07 NOTE — Progress Notes (Signed)
Patient tolerated procedure well. Ambulate w/o difficulty. Denies any lightheadedness or being dizzy. Pt denies any pain at this time. Small amount of swelling noted to the IV site at the completion of the scan, [facility] CT tech aware and Dr Jobe Igo at bedside with pt to evaluate. Infiltration instructions given to pt as well as an icepack and cloth to cover the skin. Swelling decreased at this time, pt verbalized understanding of what to look for and when to follow up for additional care.Sitting in chair, drinking water provided. P is encouraged to drink additional water throughout the day and reason explained to patient. Patient verbalized understanding and all questions answered. ABC intact. No further needs at this time. Discharge from procedure area w/o issues.

## 2022-08-09 ENCOUNTER — Telehealth: Payer: Self-pay | Admitting: Family Medicine

## 2022-08-09 ENCOUNTER — Ambulatory Visit (HOSPITAL_COMMUNITY): Payer: Medicare HMO | Attending: Cardiology

## 2022-08-09 DIAGNOSIS — R0602 Shortness of breath: Secondary | ICD-10-CM | POA: Insufficient documentation

## 2022-08-09 LAB — ECHOCARDIOGRAM COMPLETE
Area-P 1/2: 3.46 cm2
P 1/2 time: 1011 msec
S' Lateral: 2.4 cm

## 2022-08-09 NOTE — Telephone Encounter (Signed)
Contacted Gretta Cool to schedule their annual wellness visit. Appointment made for 10/04/2022.  [place] Direct Dial: [phone]

## 2022-08-10 ENCOUNTER — Other Ambulatory Visit: Payer: Self-pay | Admitting: Family Medicine

## 2022-08-10 DIAGNOSIS — F411 Generalized anxiety disorder: Secondary | ICD-10-CM

## 2022-08-10 DIAGNOSIS — E0842 Diabetes mellitus due to underlying condition with diabetic polyneuropathy: Secondary | ICD-10-CM

## 2022-08-22 NOTE — Progress Notes (Signed)
Established patient visit   [name]   DOB: 12/20/1950   72 y.o. Female  MRN: [id number] Visit Date: 08/23/2022  Today's healthcare provider: Eulis Foster, MD   Chief Complaint  Patient presents with   Medical Management of Chronic Issues   Subjective    Patient presents today for hypertension and DM follow up. Patient reports occasional chest pain, has seen cardiology and is scheduled for follow up in about a week. Patient reports continued issues with her esophagus and food feeling like it is stuck, she does not see GI on a regular basis. Patient has no other complaints or concerns.       Patient here for 2 month follow-up for dyspnea on exertion and substernal chest pain  Reviewed EMR for cardiology encounter on 07/13/22  For chest pain, she was recommended to have coronary calcium CT, continue ASA, beta blocker and statin therapy, and to start Imdur if symptoms worsened prior imaging and she was started on PRN nitro For her SOB, she has undergone echocardiogram that showed mild aortic insufficiency, and her losartan was switched to irbesartan BID  Today she states that she has not had to use the nitroglycerin as of yet.    Diabetes Mellitus Type II, Follow-up  Lab Results  Component Value Date   HGBA1C 6.3 (A) 02/20/2022   HGBA1C 6.6 (H) 09/22/2021   HGBA1C 6.3 (A) 04/13/2021   Wt Readings from Last 3 Encounters:  07/13/22 192 lb (87.1 kg)  06/23/22 188 lb 8 oz (85.5 kg)  06/09/22 191 lb 8 oz (86.9 kg)    Patient has POC A1c completed on 07/17/22 was 6.5  Last seen for diabetes 3 months ago.  Management since then includes continue current regimen including metformin 500mg  BID, glargine 24 units daily, glipizide 10mg  BID . She reports good compliance with treatment. She is not having side effects. Symptoms: Yes fatigue No foot ulcerations  Yes appetite changes No nausea  Yes paresthesia of the feet  No polydipsia  Yes polyuria Yes  visual disturbances   No vomiting     Home blood sugar records: fasting range: 110-200  Episodes of hypoglycemia? Yes 2-3 times a week, will get busy and forget to eat   Current insulin regiment: 24 units of Basaglar, Metformin, Glipizide Most Recent Eye Exam: scheduled next week Current exercise: none Current diet habits: in general, a "healthy" diet    Pertinent Labs: Lab Results  Component Value Date   CHOL 153 09/22/2021   HDL 62 09/22/2021   LDLCALC 63 09/22/2021   TRIG 165 (H) 09/22/2021   CHOLHDL 2.5 09/22/2021   Lab Results  Component Value Date   NA 135 07/20/2022   K 4.5 07/20/2022   CREATININE 1.39 (H) 07/20/2022   GFRNONAA 40 (L) 07/20/2022   LABMICR 20.7 06/09/2022   MICRALBCREAT 13 06/09/2022     --------------------------------------------------------------------------------------------------- Recurrent Dysphagia Reports that she has been having a hard time swallowing meat She has had to be seen by gasttro for removal of strictures in the past  She tries to drink to help with meat pass but has to  She has had to induce vomiting to get the food up because it wont go down  This current episode has been progressively reoccuring  She has been eating more slowly  She had it for the last month   Medications: Outpatient Medications Prior to Visit  Medication Sig   acetaminophen (TYLENOL) 500 MG tablet Take 500 mg by  mouth every 6 (six) hours as needed.    aspirin EC 81 MG tablet Take 81 mg by mouth daily.    carvedilol (COREG) 12.5 MG tablet TAKE 1 TABLET BY MOUTH TWICE DAILY WITH A MEAL   cholestyramine (QUESTRAN) 4 g packet DISSOLVE AND TAKE ONE PACKET BY MOUTH THREE TIMES DAILY   cyanocobalamin (,VITAMIN B-12,) 1000 MCG/ML injection Inject into the muscle daily.   DULoxetine (CYMBALTA) 60 MG capsule Take 1 capsule by mouth once daily   gabapentin (NEURONTIN) 100 MG capsule Take 1 capsule (100 mg total) by mouth 3 (three) times daily.   glipiZIDE  (GLUCOTROL) 10 MG tablet Take 1 tablet (10 mg total) by mouth 2 (two) times daily.   glucose blood (ONETOUCH ULTRA) test strip 1 each by Other route 3 (three) times daily. Use as instructed   irbesartan (AVAPRO) 300 MG tablet Take 1 tablet (300 mg total) by mouth daily.   Lancets (ONETOUCH DELICA PLUS 123XX123) MISC USE 1  TO CHECK GLUCOSE ONCE DAILY   lovastatin (MEVACOR) 40 MG tablet Take 1 tablet by mouth once daily   metFORMIN (GLUCOPHAGE) 500 MG tablet Take 1 tablet (500 mg total) by mouth 2 (two) times daily with a meal.   metoprolol tartrate (LOPRESSOR) 25 MG tablet Take 2 hours prior to Cardiac CT   nitroGLYCERIN (NITROSTAT) 0.4 MG SL tablet Place 1 tablet (0.4 mg total) under the tongue every 5 (five) minutes as needed for chest pain.   omeprazole (PRILOSEC) 40 MG capsule Take 1 capsule (40 mg total) by mouth daily.   [DISCONTINUED] Insulin Glargine (BASAGLAR KWIKPEN) 100 UNIT/ML INJECT 24 UNITS SUBCUTANEOUSLY ONCE DAILY   No facility-administered medications prior to visit.    Review of Systems  All other systems reviewed and are negative.      Objective    BP (!) 142/71   Pulse 64   Temp 98.1 F (36.7 C) (Oral)   SpO2 100%    Physical Exam Vitals reviewed.  Constitutional:      General: She is not in acute distress.    Appearance: Normal appearance. She is not ill-appearing, toxic-appearing or diaphoretic.  Eyes:     Conjunctiva/sclera: Conjunctivae normal.  Neck:     Thyroid: No thyroid mass, thyromegaly or thyroid tenderness.     Vascular: No carotid bruit.  Cardiovascular:     Rate and Rhythm: Normal rate and regular rhythm.     Pulses: Normal pulses.     Heart sounds: Normal heart sounds. No murmur heard.    No friction rub. No gallop.  Pulmonary:     Effort: Pulmonary effort is normal. No respiratory distress.     Breath sounds: Normal breath sounds. No stridor. No wheezing, rhonchi or rales.  Abdominal:     General: Bowel sounds are normal. There is no  distension.     Palpations: Abdomen is soft.     Tenderness: There is no abdominal tenderness.  Musculoskeletal:     Right lower leg: No edema.     Left lower leg: No edema.  Lymphadenopathy:     Cervical: No cervical adenopathy.  Skin:    Findings: No erythema or rash.  Neurological:     Mental Status: She is alert.      No results found for any visits on 08/23/22.  Assessment & Plan     Problem List Items Addressed This Visit       Cardiovascular and Mediastinum   Essential hypertension    Chronic  BP slightly  elevated  She will continue current regimen including carvedilol 12.5 mg twice daily &  irbesartan 300 mg daily        Digestive   Esophageal dysphagia    Referral for gastroenterology submitted today Patient also recommended for updated colonoscopy         Endocrine   Diabetes [facility]    She will continue glargine 24 units daily, metformin 500 mg twice daily and glipizide 10 mg twice daily A1c is within goal range at 6.5 based on review of outside records patient has brought to visit today She is up-to-date on her urine albumin testing        Other   Screening for colon cancer - Primary    Referral for gastroenterology submitted        Relevant Orders   Ambulatory referral to Gastroenterology   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        The entirety of the information documented in the History of Present Illness, Review of Systems and Physical Exam were personally obtained by me. Portions of this information were initially documented by Pricilla Handler, [place] . I, Eulis Foster, MD have reviewed the documentation above for thoroughness and accuracy.      Eulis Foster, MD  [facility] [phone] (phone) [phone] (fax)  [place]

## 2022-08-23 ENCOUNTER — Ambulatory Visit (INDEPENDENT_AMBULATORY_CARE_PROVIDER_SITE_OTHER): Payer: Medicare HMO | Admitting: Family Medicine

## 2022-08-23 ENCOUNTER — Other Ambulatory Visit: Payer: Self-pay | Admitting: Family Medicine

## 2022-08-23 ENCOUNTER — Encounter: Payer: Self-pay | Admitting: Family Medicine

## 2022-08-23 VITALS — BP 142/71 | HR 64 | Temp 98.1°F

## 2022-08-23 DIAGNOSIS — I739 Peripheral vascular disease, unspecified: Secondary | ICD-10-CM | POA: Diagnosis not present

## 2022-08-23 DIAGNOSIS — I10 Essential (primary) hypertension: Secondary | ICD-10-CM | POA: Diagnosis not present

## 2022-08-23 DIAGNOSIS — Z1211 Encounter for screening for malignant neoplasm of colon: Secondary | ICD-10-CM | POA: Insufficient documentation

## 2022-08-23 DIAGNOSIS — E0842 Diabetes mellitus due to underlying condition with diabetic polyneuropathy: Secondary | ICD-10-CM | POA: Diagnosis not present

## 2022-08-23 DIAGNOSIS — R1319 Other dysphagia: Secondary | ICD-10-CM

## 2022-08-23 DIAGNOSIS — Z1231 Encounter for screening mammogram for malignant neoplasm of breast: Secondary | ICD-10-CM

## 2022-08-25 ENCOUNTER — Other Ambulatory Visit: Payer: Self-pay | Admitting: Family Medicine

## 2022-08-25 DIAGNOSIS — E0842 Diabetes mellitus due to underlying condition with diabetic polyneuropathy: Secondary | ICD-10-CM

## 2022-08-25 NOTE — Assessment & Plan Note (Signed)
Referral for gastroenterology submitted

## 2022-08-25 NOTE — Assessment & Plan Note (Signed)
Referral for gastroenterology submitted today Patient also recommended for updated colonoscopy

## 2022-08-25 NOTE — Assessment & Plan Note (Signed)
Chronic  BP slightly elevated  She will continue current regimen including carvedilol 12.5 mg twice daily &  irbesartan 300 mg daily

## 2022-08-25 NOTE — Assessment & Plan Note (Signed)
She will continue glargine 24 units daily, metformin 500 mg twice daily and glipizide 10 mg twice daily A1c is within goal range at 6.5 based on review of outside records patient has brought to visit today She is up-to-date on her urine albumin testing

## 2022-08-25 NOTE — Assessment & Plan Note (Signed)
Review of outside records from home visit showed abnormal ABIs  Patient reports claudication symptoms  Referral submitted for vascular consultation

## 2022-09-07 ENCOUNTER — Ambulatory Visit
Admission: RE | Admit: 2022-09-07 | Discharge: 2022-09-07 | Disposition: A | Discharge status: Home / Self Care | Payer: Medicare HMO | Source: Ambulatory Visit | Attending: Family Medicine | Admitting: Family Medicine

## 2022-09-07 DIAGNOSIS — Z1231 Encounter for screening mammogram for malignant neoplasm of breast: Secondary | ICD-10-CM | POA: Diagnosis not present

## 2022-09-08 ENCOUNTER — Other Ambulatory Visit: Payer: Self-pay | Admitting: Family Medicine

## 2022-09-08 DIAGNOSIS — E1122 Type 2 diabetes mellitus with diabetic chronic kidney disease: Secondary | ICD-10-CM

## 2022-09-08 DIAGNOSIS — N183 Chronic kidney disease, stage 3 unspecified: Secondary | ICD-10-CM

## 2022-09-08 NOTE — Telephone Encounter (Signed)
Requested Prescriptions  Pending Prescriptions Disp Refills   lovastatin (MEVACOR) 40 MG tablet [Pharmacy Med Name: Lovastatin 40 MG Oral Tablet] 90 tablet 0    Sig: Take 1 tablet by mouth once daily     Cardiovascular:  Antilipid - Statins 2 Failed - 09/08/2022  6:40 AM      Failed - Cr in normal range and within 360 days    Creatinine  Date Value Ref Range Status  03/01/2012 1.72 (H) 0.60 - 1.30 mg/dL Final   Creatinine, Ser  Date Value Ref Range Status  07/20/2022 1.39 (H) 0.44 - 1.00 mg/dL Final   Creatinine, POC  Date Value Ref Range Status  08/02/2017 NA mg/dL Final         Failed - Lipid Panel in normal range within the last 12 months    Cholesterol, Total  Date Value Ref Range Status  09/22/2021 153 100 - 199 mg/dL Final   LDL Chol Calc (NIH)  Date Value Ref Range Status  09/22/2021 63 0 - 99 mg/dL Final   HDL  Date Value Ref Range Status  09/22/2021 62 >39 mg/dL Final   Triglycerides  Date Value Ref Range Status  09/22/2021 165 (H) 0 - 149 mg/dL Final         Passed - Patient is not pregnant      Passed - Valid encounter within last 12 months    Recent Outpatient Visits           2 weeks ago Screening for colon cancer   [place] [facility], Riki Sheer, MD   2 months ago SOB (shortness of breath)   [place] [facility]-Robinson, [place], MD   3 months ago Primary hypertension   [place] [facility], Gideon, MD   3 months ago Primary hypertension   [place] [facility], [place], MD   6 months ago Type 2 diabetes mellitus with stage 3 chronic kidney disease, without long-term current use of insulin, unspecified whether stage 3a or 3b CKD ([place])   [place] [facility] Post, MD       Future Appointments             In 1 week Elgie Collard, PA-C [place] [facility] [employer],  [place]   In 3 months Simmons-Rob[facility], Financial risk analyst, MD [facility], [place]

## 2022-09-12 DIAGNOSIS — I10 Essential (primary) hypertension: Secondary | ICD-10-CM | POA: Diagnosis not present

## 2022-09-12 DIAGNOSIS — E119 Type 2 diabetes mellitus without complications: Secondary | ICD-10-CM | POA: Diagnosis not present

## 2022-09-13 ENCOUNTER — Other Ambulatory Visit: Payer: Self-pay | Admitting: Family Medicine

## 2022-09-13 DIAGNOSIS — R197 Diarrhea, unspecified: Secondary | ICD-10-CM

## 2022-09-19 NOTE — Progress Notes (Signed)
Office Visit    Patient Name: Cynthia Sims Date of Encounter: 09/19/2022  PCP:  [name], MD   [facility]  Cardiologist:  Christell Constant, MD  Advanced Practice Provider:  No care team member to display Electrophysiologist:  None   HPI    [name] is a 72 y.o. female with a past medical history of hypertension, diabetes mellitus, OSA, CKD stage IIIa, HLD, morbid obesity, aortic atherosclerosis presents today for follow-up appointment.  She was last seen 07/13/2022 by Dr. Izora Ribas and at that time she was feeling some chest discomfort for the last few months.  She felt it when she was cleaning and she had to sit down.  She felt weak and gets chest discomfort and shortness of breath.  Chest pain was predominantly with exertion.  She notes shortness of breath.  No PND/orthopnea.  Some leg swelling and gaining weight.  No syncope or near syncope.  No palpitations.  No prior cardiac testing.  As needed nitroglycerin was added and plan for coronary CTA.  Coronary calcium score was 398 with 86 percentile for age and sex matched control.  Moderate proximal LAD stenosis (50%), mild proximal LCx stenosis (25 to 49%), additional analysis with CT FFR submitted.  FFR was not performed due to motion related artifact.  Plan for continued antianginal therapy and aspirin/statin.  Today, she***     Past Medical History    Past Medical History:  Diagnosis Date   ALT (SGPT) level raised    Anemia    Anxiety    Arthritis    Depression    Diabetes mellitus without complication (HCC)    Elevated cholesterol    GERD (gastroesophageal reflux disease)    Hemorrhoids    Hypertension    Kidney disease    Past Surgical History:  Procedure Laterality Date   abdominal blockage     CESAREAN SECTION  1983   CESAREAN SECTION     CHOLECYSTECTOMY     COLONOSCOPY WITH PROPOFOL N/A 10/29/2015   Procedure: COLONOSCOPY WITH PROPOFOL;  Surgeon:  Scot Jun, MD;  Location: [facility] ENDOSCOPY;  Service: Endoscopy;  Laterality: N/A;   COLONOSCOPY WITH PROPOFOL N/A 11/07/2017   Procedure: COLONOSCOPY WITH PROPOFOL;  Surgeon: [place], Boykin Nearing, MD;  Location: [facility] ENDOSCOPY;  Service: Gastroenterology;  Laterality: N/A;   DILATION AND CURETTAGE OF UTERUS  2001   ESOPHAGOGASTRODUODENOSCOPY (EGD) WITH PROPOFOL N/A 10/29/2015   Procedure: ESOPHAGOGASTRODUODENOSCOPY (EGD) WITH PROPOFOL;  Surgeon: Scot Jun, MD;  Location[facility] END[facility]COPY;  Service: Endoscopy;  Laterality: N/A;    Allergies  No Known Allergies   EKGs/Labs/Other Studies Reviewed:   The following studies were reviewed today:  IMPRESSION: 1. Coronary calcium score of 398. This was 86th percentile for age and sex matched control.   2. Normal coronary origin with right dominance.   3. Moderate proximal LAD stenosis (50%).   4. Mild proximal LCx stenosis (25-49%).   5. CAD-RADS 3. Moderate stenosis. Consider symptom-guided anti-ischemic pharmacotherapy as well as risk factor modification per guideline directed care.   6. Additional analysis with CT FFR will be submitted.   Electronically Signed: By: Debbe Odea M.D. On: 08/07/2022 14:18  EKG:  EKG is *** ordered today.  The ekg ordered today demonstrates ***  Recent Labs: 09/22/2021: TSH 2.080 06/09/2022: ALT 14; BNP 21.9; Hemoglobin 11.3; Platelets 146 07/20/2022: BUN 24; Creatinine, Ser 1.39; Potassium 4.5; Sodium 135  Recent Lipid Panel    Component Value Date/Time  CHOL 153 09/22/2021 1121   TRIG 165 (H) 09/22/2021 1121   HDL 62 09/22/2021 1121   CHOLHDL 2.5 09/22/2021 1121   LDLCALC 63 09/22/2021 1121    Risk Assessment/Calculations:  {Does this patient have ATRIAL FIBRILLATION?:[phone]}  Home Medications   No outpatient medications have been marked as taking for the 09/20/22 encounter (Appointment) with Sharlene Dory, PA-C.     Review of Systems   ***   All other systems  reviewed and are otherwise negative except as noted above.  Physical Exam    VS:  There were no vitals taken for this visit. , BMI There is no height or weight on file to calculate BMI.  Wt Readings from Last 3 Encounters:  07/13/22 192 lb (87.1 kg)  06/23/22 188 lb 8 oz (85.5 kg)  06/09/22 191 lb 8 oz (86.9 kg)     GEN: Well nourished, well developed, in no acute distress. HEENT: normal. Neck: Supple, no JVD, carotid bruits, or masses. Cardiac: ***RRR, no murmurs, rubs, or gallops. No clubbing, cyanosis, edema.  ***Radials/PT 2+ and equal bilaterally.  Respiratory:  ***Respirations regular and unlabored, clear to auscultation bilaterally. GI: Soft, nontender, nondistended. MS: No deformity or atrophy. Skin: Warm and dry, no rash. Neuro:  Strength and sensation are intact. Psych: Normal affect.  Assessment & Plan    Moderate coronary artery disease/FFR not performed Hypertension SOB  No BP recorded.  {Refresh Note OR Click here to enter BP  :1}***      Disposition: Follow up {follow up:15908} with Christell Constant, MD or APP.  Signed, Sharlene Dory, PA-C 09/19/2022, 8:20 PM [facility]

## 2022-09-20 ENCOUNTER — Ambulatory Visit: Payer: Medicare HMO | Attending: Physician Assistant | Admitting: Physician Assistant

## 2022-09-20 ENCOUNTER — Encounter: Payer: Self-pay | Admitting: Physician Assistant

## 2022-09-20 VITALS — BP 134/70 | HR 71 | Ht 61.0 in | Wt 196.8 lb

## 2022-09-20 DIAGNOSIS — I10 Essential (primary) hypertension: Secondary | ICD-10-CM

## 2022-09-20 DIAGNOSIS — I2510 Atherosclerotic heart disease of native coronary artery without angina pectoris: Secondary | ICD-10-CM | POA: Diagnosis not present

## 2022-09-20 DIAGNOSIS — R0602 Shortness of breath: Secondary | ICD-10-CM

## 2022-09-20 MED ORDER — ISOSORBIDE MONONITRATE ER 30 MG PO TB24: 30.0000 mg | ORAL_TABLET | Freq: Every day | ORAL | 3 refills | Status: DC

## 2022-09-20 NOTE — Patient Instructions (Signed)
Medication Instructions:  1.Start isosorbide mononitrate (Imdur) 30 mg daily *If you need a refill on your cardiac medications before your next appointment, please call your pharmacy*  Lab Work: None If you have labs (blood work) drawn today and your tests are completely normal, you will receive your results only by: MyChart Message (if you have MyChart) OR A paper copy in the mail If you have any lab test that is abnormal or we need to change your treatment, we will call you to review the results.  Follow-Up: At [facility], you and your health needs are our priority.  As part of our continuing mission to provide you with exceptional heart care, we have created designated Provider Care Teams.  These Care Teams include your primary Cardiologist (physician) and Advanced Practice Providers (APPs -  Physician Assistants and Nurse Practitioners) who all work together to provide you with the care you need, when you need it.  Your next appointment:   4 month(s)  Provider:   Christell Constant, MD   Schedule a 3 week follow up nurse visit for a blood pressure check.  Other Instructions Check your blood pressure daily, one hour after taking your morning medications for 2 weeks, keep a log and send [place] the readings through mychart at the end of the 2 weeks.  Low-Sodium Eating Plan Sodium, which is an element that makes up salt, helps you maintain a healthy balance of fluids in your body. Too much sodium can increase your blood pressure and cause fluid and waste to be held in your body. Your health care provider or dietitian may recommend following this plan if you have high blood pressure (hypertension), kidney disease, liver disease, or heart failure. Eating less sodium can help lower your blood pressure, reduce swelling, and protect your heart, liver, and kidneys. What are tips for following this plan? Reading food labels The Nutrition Facts label lists the amount of sodium in one  serving of the food. If you eat more than one serving, you must multiply the listed amount of sodium by the number of servings. Choose foods with less than 140 mg of sodium per serving. Avoid foods with 300 mg of sodium or more per serving. Shopping  Look for lower-sodium products, often labeled as "low-sodium" or "no salt added." Always check the sodium content, even if foods are labeled as "unsalted" or "no salt added." Buy fresh foods. Avoid canned foods and pre-made or frozen meals. Avoid canned, cured, or processed meats. Buy breads that have less than 80 mg of sodium per slice. Cooking  Eat more home-cooked food and less restaurant, buffet, and fast food. Avoid adding salt when cooking. Use salt-free seasonings or herbs instead of table salt or sea salt. Check with your health care provider or pharmacist before using salt substitutes. Cook with plant-based oils, such as canola, sunflower, or olive oil. Meal planning When eating at a restaurant, ask that your food be prepared with less salt or no salt, if possible. Avoid dishes labeled as brined, pickled, cured, smoked, or made with soy sauce, miso, or teriyaki sauce. Avoid foods that contain MSG (monosodium glutamate). MSG is sometimes added to [place] food, bouillon, and some canned foods. Make meals that can be grilled, baked, poached, roasted, or steamed. These are generally made with less sodium. General information Most people on this plan should limit their sodium intake to 1,500-2,000 mg (milligrams) of sodium each day. What foods should I eat? Fruits Fresh, frozen, or canned fruit. Fruit  juice. Vegetables Fresh or frozen vegetables. "No salt added" canned vegetables. "No salt added" tomato sauce and paste. Low-sodium or reduced-sodium tomato and vegetable juice. Grains Low-sodium cereals, including oats, puffed wheat and rice, and shredded wheat. Low-sodium crackers. Unsalted rice. Unsalted pasta. Low-sodium bread.  Whole-grain breads and whole-grain pasta. Meats and other proteins Fresh or frozen (no salt added) meat, poultry, seafood, and fish. Low-sodium canned tuna and salmon. Unsalted nuts. Dried peas, beans, and lentils without added salt. Unsalted canned beans. Eggs. Unsalted nut butters. Dairy Milk. Soy milk. Cheese that is naturally low in sodium, such as ricotta cheese, fresh mozzarella, or Swiss cheese. Low-sodium or reduced-sodium cheese. Cream cheese. Yogurt. Seasonings and condiments Fresh and dried herbs and spices. Salt-free seasonings. Low-sodium mustard and ketchup. Sodium-free salad dressing. Sodium-free light mayonnaise. Fresh or refrigerated horseradish. Lemon juice. Vinegar. Other foods Homemade, reduced-sodium, or low-sodium soups. Unsalted popcorn and pretzels. Low-salt or salt-free chips. The items listed above may not be a complete list of foods and beverages you can eat. Contact a dietitian for more information. What foods should I avoid? Vegetables Sauerkraut, pickled vegetables, and relishes. Olives. [place] fries. Onion rings. Regular canned vegetables (not low-sodium or reduced-sodium). Regular canned tomato sauce and paste (not low-sodium or reduced-sodium). Regular tomato and vegetable juice (not low-sodium or reduced-sodium). Frozen vegetables in sauces. Grains Instant hot cereals. Bread stuffing, pancake, and biscuit mixes. Croutons. Seasoned rice or pasta mixes. Noodle soup cups. Boxed or frozen macaroni and cheese. Regular salted crackers. Self-rising flour. Meats and other proteins Meat or fish that is salted, canned, smoked, spiced, or pickled. Precooked or cured meat, such as sausages or meat loaves. Tomasa Blase. Ham. Pepperoni. Hot dogs. Corned beef. Chipped beef. Salt pork. Jerky. Pickled herring. Anchovies and sardines. Regular canned tuna. Salted nuts. Dairy Processed cheese and cheese spreads. Hard cheeses. Cheese curds. Blue cheese. Feta cheese. String cheese. Regular  cottage cheese. Buttermilk. Canned milk. Fats and oils Salted butter. Regular margarine. Ghee. Bacon fat. Seasonings and condiments Onion salt, garlic salt, seasoned salt, table salt, and sea salt. Canned and packaged gravies. Worcestershire sauce. Tartar sauce. Barbecue sauce. Teriyaki sauce. Soy sauce, including reduced-sodium. Steak sauce. Fish sauce. Oyster sauce. Cocktail sauce. Horseradish that you find on the shelf. Regular ketchup and mustard. Meat flavorings and tenderizers. Bouillon cubes. Hot sauce. Pre-made or packaged marinades. Pre-made or packaged taco seasonings. Relishes. Regular salad dressings. Salsa. Other foods Salted popcorn and pretzels. Corn chips and puffs. Potato and tortilla chips. Canned or dried soups. Pizza. Frozen entrees and pot pies. The items listed above may not be a complete list of foods and beverages you should avoid. Contact a dietitian for more information. Summary Eating less sodium can help lower your blood pressure, reduce swelling, and protect your heart, liver, and kidneys. Most people on this plan should limit their sodium intake to 1,500-2,000 mg (milligrams) of sodium each day. Canned, boxed, and frozen foods are high in sodium. Restaurant foods, fast foods, and pizza are also very high in sodium. You also get sodium by adding salt to food. Try to cook at home, eat more fresh fruits and vegetables, and eat less fast food and canned, processed, or prepared foods. This information is not intended to replace advice given to you by your health care provider. Make sure you discuss any questions you have with your health care provider. Document Revised: 05/05/2019 Document Reviewed: 04/30/2019 Elsevier Patient Education  2023 Elsevier Inc.   Heart-Healthy Eating Plan Many factors influence your heart health, including eating and  exercise habits. Heart health is also called coronary health. Coronary risk increases with abnormal blood fat (lipid) levels. A  heart-healthy eating plan includes limiting unhealthy fats, increasing healthy fats, limiting salt (sodium) intake, and making other diet and lifestyle changes. What is my plan? Your health care provider may recommend that: You limit your fat intake to _________% or less of your total calories each day. You limit your saturated fat intake to _________% or less of your total calories each day. You limit the amount of cholesterol in your diet to less than _________ mg per day. You limit the amount of sodium in your diet to less than _________ mg per day. What are tips for following this plan? Cooking Cook foods using methods other than frying. Baking, boiling, grilling, and broiling are all good options. Other ways to reduce fat include: Removing the skin from poultry. Removing all visible fats from meats. Steaming vegetables in water or broth. Meal planning  At meals, imagine dividing your plate into fourths: Fill one-half of your plate with vegetables and green salads. Fill one-fourth of your plate with whole grains. Fill one-fourth of your plate with lean protein foods. Eat 2-4 cups of vegetables per day. One cup of vegetables equals 1 cup (91 g) broccoli or cauliflower florets, 2 medium carrots, 1 large bell pepper, 1 large sweet potato, 1 large tomato, 1 medium white potato, 2 cups (150 g) raw leafy greens. Eat 1-2 cups of fruit per day. One cup of fruit equals 1 small apple, 1 large banana, 1 cup (237 g) mixed fruit, 1 large orange,  cup (82 g) dried fruit, 1 cup (240 mL) 100% fruit juice. Eat more foods that contain soluble fiber. Examples include apples, broccoli, carrots, beans, peas, and barley. Aim to get 25-30 g of fiber per day. Increase your consumption of legumes, nuts, and seeds to 4-5 servings per week. One serving of dried beans or legumes equals  cup (90 g) cooked, 1 serving of nuts is  oz (12 almonds, 24 pistachios, or 7 walnut halves), and 1 serving of seeds equals  oz  (8 g). Fats Choose healthy fats more often. Choose monounsaturated and polyunsaturated fats, such as olive and canola oils, avocado oil, flaxseeds, walnuts, almonds, and seeds. Eat more omega-3 fats. Choose salmon, mackerel, sardines, tuna, flaxseed oil, and ground flaxseeds. Aim to eat fish at least 2 times each week. Check food labels carefully to identify foods with trans fats or high amounts of saturated fat. Limit saturated fats. These are found in animal products, such as meats, butter, and cream. Plant sources of saturated fats include palm oil, palm kernel oil, and coconut oil. Avoid foods with partially hydrogenated oils in them. These contain trans fats. Examples are stick margarine, some tub margarines, cookies, crackers, and other baked goods. Avoid fried foods. General information Eat more home-cooked food and less restaurant, buffet, and fast food. Limit or avoid alcohol. Limit foods that are high in added sugar and simple starches such as foods made using white refined flour (white breads, pastries, sweets). Lose weight if you are overweight. Losing just 5-10% of your body weight can help your overall health and prevent diseases such as diabetes and heart disease. Monitor your sodium intake, especially if you have high blood pressure. Talk with your health care provider about your sodium intake. Try to incorporate more vegetarian meals weekly. What foods should I eat? Fruits All fresh, canned (in natural juice), or frozen fruits. Vegetables Fresh or frozen vegetables (raw, steamed,  roasted, or grilled). Green salads. Grains Most grains. Choose whole wheat and whole grains most of the time. Rice and pasta, including brown rice and pastas made with whole wheat. Meats and other proteins Lean, well-trimmed beef, veal, pork, and lamb. Chicken and [place] without skin. All fish and shellfish. Wild duck, rabbit, pheasant, and venison. Egg whites or low-cholesterol egg substitutes. Dried  beans, peas, lentils, and tofu. Seeds and most nuts. Dairy Low-fat or nonfat cheeses, including ricotta and mozzarella. Skim or 1% milk (liquid, powdered, or evaporated). Buttermilk made with low-fat milk. Nonfat or low-fat yogurt. Fats and oils Non-hydrogenated (trans-free) margarines. Vegetable oils, including soybean, sesame, sunflower, olive, avocado, peanut, safflower, corn, canola, and cottonseed. Salad dressings or mayonnaise made with a vegetable oil. Beverages Water (mineral or sparkling). Coffee and tea. Unsweetened ice tea. Diet beverages. Sweets and desserts Sherbet, gelatin, and fruit ice. Small amounts of dark chocolate. Limit all sweets and desserts. Seasonings and condiments All seasonings and condiments. The items listed above may not be a complete list of foods and beverages you can eat. Contact a dietitian for more options. What foods should I avoid? Fruits Canned fruit in heavy syrup. Fruit in cream or butter sauce. Fried fruit. Limit coconut. Vegetables Vegetables cooked in cheese, cream, or butter sauce. Fried vegetables. Grains Breads made with saturated or trans fats, oils, or whole milk. Croissants. Sweet rolls. Donuts. High-fat crackers, such as cheese crackers and chips. Meats and other proteins Fatty meats, such as hot dogs, ribs, sausage, bacon, rib-eye roast or steak. High-fat deli meats, such as salami and bologna. Caviar. Domestic duck and goose. Organ meats, such as liver. Dairy Cream, sour cream, cream cheese, and creamed cottage cheese. Whole-milk cheeses. Whole or 2% milk (liquid, evaporated, or condensed). Whole buttermilk. Cream sauce or high-fat cheese sauce. Whole-milk yogurt. Fats and oils Meat fat, or shortening. Cocoa butter, hydrogenated oils, palm oil, coconut oil, palm kernel oil. Solid fats and shortenings, including bacon fat, salt pork, lard, and butter. Nondairy cream substitutes. Salad dressings with cheese or sour cream. Beverages Regular  sodas and any drinks with added sugar. Sweets and desserts Frosting. Pudding. Cookies. Cakes. Pies. Milk chocolate or white chocolate. Buttered syrups. Full-fat ice cream or ice cream drinks. The items listed above may not be a complete list of foods and beverages to avoid. Contact a dietitian for more information. Summary Heart-healthy meal planning includes limiting unhealthy fats, increasing healthy fats, limiting salt (sodium) intake and making other diet and lifestyle changes. Lose weight if you are overweight. Losing just 5-10% of your body weight can help your overall health and prevent diseases such as diabetes and heart disease. Focus on eating a balance of foods, including fruits and vegetables, low-fat or nonfat dairy, lean protein, nuts and legumes, whole grains, and heart-healthy oils and fats. This information is not intended to replace advice given to you by your health care provider. Make sure you discuss any questions you have with your health care provider. Document Revised: 07/04/2021 Document Reviewed: 07/04/2021 Elsevier Patient Education  2023 [employer].

## 2022-10-04 ENCOUNTER — Ambulatory Visit (INDEPENDENT_AMBULATORY_CARE_PROVIDER_SITE_OTHER): Payer: Medicare HMO

## 2022-10-04 ENCOUNTER — Other Ambulatory Visit (INDEPENDENT_AMBULATORY_CARE_PROVIDER_SITE_OTHER): Payer: Self-pay | Admitting: Nurse Practitioner

## 2022-10-04 VITALS — Ht 61.0 in | Wt 196.0 lb

## 2022-10-04 DIAGNOSIS — I739 Peripheral vascular disease, unspecified: Secondary | ICD-10-CM

## 2022-10-04 DIAGNOSIS — Z0000 Encounter for general adult medical examination without abnormal findings: Secondary | ICD-10-CM | POA: Diagnosis not present

## 2022-10-04 DIAGNOSIS — R9389 Abnormal findings on diagnostic imaging of other specified body structures: Secondary | ICD-10-CM

## 2022-10-04 NOTE — Progress Notes (Signed)
I connected with  Dani Gobble on 10/04/22 by a audio enabled telemedicine application and verified that I am speaking with the correct person using two identifiers.  Patient Location: Home  Provider Location: Office/Clinic  I discussed the limitations of evaluation and management by telemedicine. The patient expressed understanding and agreed to proceed.  Subjective:   Cynthia Sims is a 72 y.o. female who presents for Medicare Annual (Subsequent) preventive examination.  Review of Systems    Cardiac Risk Factors include: advanced age (>61men, >69 women);diabetes mellitus;dyslipidemia;hypertension;obesity (BMI >30kg/m2);sedentary lifestyle     Objective:    Today's Vitals   10/04/22 1402  Weight: 196 lb (88.9 kg)  Height: 5\' 1"  (1.549 m)   Body mass index is 37.03 kg/m.     10/04/2022    2:10 PM 09/17/2019    1:48 PM 08/22/2018   10:33 AM 11/07/2017    1:19 PM 07/20/2017    8:48 AM 07/20/2016    9:28 PM 01/06/2016    6:56 PM  Advanced Directives  Does Patient Have a Medical Advance Directive? No Yes No No No No No  Type of Furniture conservator/restorer;Living will       Copy of Healthcare Power of Attorney in Chart?  No - copy requested       Would patient like information on creating a medical advance directive?   No - Patient declined  No - Patient declined  No - patient declined information    Current Medications (verified) Outpatient Encounter Medications as of 10/04/2022  Medication Sig   acetaminophen (TYLENOL) 500 MG tablet Take 500 mg by mouth every 6 (six) hours as needed.    aspirin EC 81 MG tablet Take 81 mg by mouth daily.    carvedilol (COREG) 12.5 MG tablet TAKE 1 TABLET BY MOUTH TWICE DAILY WITH A MEAL   cholestyramine (QUESTRAN) 4 g packet DISSOLVE AND TAKE ONE PACKET BY MOUTH THREE TIMES DAILY   cyanocobalamin (,VITAMIN B-12,) 1000 MCG/ML injection Inject into the muscle daily.   DULoxetine (CYMBALTA) 60 MG capsule Take 1 capsule by  mouth once daily   gabapentin (NEURONTIN) 100 MG capsule Take 1 capsule (100 mg total) by mouth 3 (three) times daily.   glipiZIDE (GLUCOTROL) 10 MG tablet Take 1 tablet (10 mg total) by mouth 2 (two) times daily.   glucose blood (ONETOUCH ULTRA) test strip 1 each by Other route 3 (three) times daily. Use as instructed   Insulin Glargine (BASAGLAR KWIKPEN) 100 UNIT/ML INJECT 24 UNITS SUBCUTANEOUSLY ONCE DAILY   irbesartan (AVAPRO) 300 MG tablet Take 1 tablet (300 mg total) by mouth daily.   isosorbide mononitrate (IMDUR) 30 MG 24 hr tablet Take 1 tablet (30 mg total) by mouth daily.   Lancets (ONETOUCH DELICA PLUS LANCET33G) MISC USE 1  TO CHECK GLUCOSE ONCE DAILY   lovastatin (MEVACOR) 40 MG tablet Take 1 tablet by mouth once daily   metFORMIN (GLUCOPHAGE) 500 MG tablet Take 1 tablet (500 mg total) by mouth 2 (two) times daily with a meal.   nitroGLYCERIN (NITROSTAT) 0.4 MG SL tablet Place 1 tablet (0.4 mg total) under the tongue every 5 (five) minutes as needed for chest pain.   omeprazole (PRILOSEC) 40 MG capsule Take 1 capsule (40 mg total) by mouth daily.   No facility-administered encounter medications on file as of 10/04/2022.    Allergies (verified) Patient has no known allergies.   History: Past Medical History:  Diagnosis Date   ALT (SGPT)  level raised    Anemia    Anxiety    Arthritis    Depression    Diabetes mellitus without complication    Elevated cholesterol    GERD (gastroesophageal reflux disease)    Hemorrhoids    Hypertension    Kidney disease    Past Surgical History:  Procedure Laterality Date   abdominal blockage     CESAREAN SECTION  1983   CESAREAN SECTION     CHOLECYSTECTOMY     COLONOSCOPY WITH PROPOFOL N/A 10/29/2015   Procedure: COLONOSCOPY WITH PROPOFOL;  Surgeon: Scot Jun, MD;  Location: [facility] ENDOSCOPY;  Service: Endoscopy;  Laterality: N/A;   COLONOSCOPY WITH PROPOFOL N/A 11/07/2017   Procedure: COLONOSCOPY WITH PROPOFOL;  Surgeon:  Toledo, Boykin Nearing, MD;  Location: [facility] ENDOSCOPY;  Service: Gastroenterology;  Laterality: N/A;   DILATION AND CURETTAGE OF UTERUS  2001   ESOPHAGOGASTRODUODENOSCOPY (EGD) WITH PROPOFOL N/A 10/29/2015   Procedure: ESOPHAGOGASTRODUODENOSCOPY (EGD) WITH PROPOFOL;  Surgeon: Scot Jun, MD;  Location: [facility] ENDOSCOPY;  Service: Endoscopy;  Laterality: N/A;   Family History  Problem Relation Age of Onset   Alzheimer's disease Mother    COPD Mother    Breast cancer Mother 52   Osteoporosis Mother    Heart attack Father    Lupus Father    Diabetes Father    Hypertension Father    Diabetes Sister    COPD Maternal Grandmother    Diabetes Paternal Grandfather    Diabetes Sister    Diabetes Sister    Social History   Socioeconomic History   Marital status: Married    Spouse name: Not on file   Number of children: 5   Years of education: Not on file   Highest education level: 8th grade  Occupational History   Occupation: retired  Tobacco Use   Smoking status: Never    Passive exposure: Never   Smokeless tobacco: Never  Vaping Use   Vaping Use: Never used  Substance and Sexual Activity   Alcohol use: No    Alcohol/week: 0.0 standard drinks of alcohol   Drug use: No   Sexual activity: Yes    Birth control/protection: Post-menopausal  Other Topics Concern   Not on file  Social History Narrative   Applied for food stamps. Pt is living off social security money and is raising one of her grandchildren.    Social Determinants of Health   Financial Resource Strain: Low Risk  (10/04/2022)   Overall Financial Resource Strain (CARDIA)    Difficulty of Paying Living Expenses: Not hard at all  Food Insecurity: No Food Insecurity (10/04/2022)   Hunger Vital Sign    Worried About Running Out of Food in the Last Year: Never true    Ran Out of Food in the Last Year: Never true  Transportation Needs: No Transportation Needs (10/04/2022)   PRAPARE - Scientist, research (physical sciences) (Medical): No    Lack of Transportation (Non-Medical): No  Physical Activity: Inactive (10/04/2022)   Exercise Vital Sign    Days of Exercise per Week: 0 days    Minutes of Exercise per Session: 0 min  Stress: No Stress Concern Present (10/04/2022)   [employer] of Occupational Health - Occupational Stress Questionnaire    Feeling of Stress : Not at all  Social Connections: Moderately Integrated (10/04/2022)   Social Connection and Isolation Panel [NHANES]    Frequency of Communication with Friends and Family: More than three times a week  Frequency of Social Gatherings with Friends and Family: Once a week    Attends Religious Services: 1 to 4 times per year    Active Member of [employer] or Organizations: No    Attends Engineer, structural: Never    Marital Status: Married    Tobacco Counseling Counseling given: Not Answered  Clinical Intake:  Pre-visit preparation completed: Yes  Pain : No/denies pain   BMI - recorded: 37.03 Nutritional Status: BMI > 30  Obese Nutritional Risks: None Diabetes: Yes CBG done?: Yes (83 this am at home) CBG resulted in Enter/ Edit results?: No Did pt. bring in CBG monitor from home?: No  How often do you need to have someone help you when you read instructions, pamphlets, or other written materials from your doctor or pharmacy?: 1 - Never  Diabetic?yes  Interpreter Needed?: No  Comments: lives with husband and grand-daughter Information entered by :: B.Kemiya Batdorf,LPN   Activities of Daily Living    10/04/2022    2:11 PM 08/23/2022   10:14 AM  In your present state of health, do you have any difficulty performing the following activities:  Hearing? 0 0  Vision? 1 1  Difficulty concentrating or making decisions? 0 1  Walking or climbing stairs? 1 0  Dressing or bathing? 0 0  Doing errands, shopping? 0 0  Preparing Food and eating ? N   Using the Toilet? N   In the past six months, have you accidently leaked  urine? N   Comment self caths TID   Do you have problems with loss of bowel control? N   Managing your Medications? N   Managing your Finances? N   Housekeeping or managing your Housekeeping? N     Patient Care Team: [name], MD as PCP - General (Family Medicine) Christell Constant, MD as PCP - Cardiology (Cardiology) Linus Galas, DPM as Consulting Physician (Podiatry) Alfredo Martinez, MD as Consulting Physician (Urology) Santa Genera, MD as Referring Physician (Internal Medicine)  Indicate any recent Medical Services you may have received from other than [facility] providers in the past year (date may be approximate).     Assessment:   This is a routine wellness examination for Ayven.  Hearing/Vision screen Hearing Screening - Comments:: Adequate hearing Vision Screening - Comments:: Adequate vision w/glasses:only readers Dr Larence Penning  Dietary issues and exercise activities discussed: Current Exercise Habits: The patient does not participate in regular exercise at present, Exercise limited by: orthopedic condition(s);respiratory conditions(s)   Goals Addressed             This Visit's Progress    DIET - REDUCE SUGAR INTAKE   On track    Recommend monitoring amount of sugar consumed and avoiding sweets as much as possible.      Exercise 3x per week (30 min per time)   Not on track    Recommend to exercise for 3 days a week for at least 30 minutes at a time.      LIFESTYLE - DECREASE FALLS RISK   On track    Recommend to remove any items from the home that may cause slips or trips.       Depression Screen    10/04/2022    2:07 PM 08/23/2022   10:14 AM 06/23/2022    2:06 PM 05/22/2022    9:48 AM 02/20/2022    1:03 PM 04/13/2021    9:03 AM 10/18/2020    9:47 AM  PHQ 2/9 Scores  PHQ - 2 Score  0 0  PHQ- 9 Score  Fall Risk    10/04/2022    2:05 PM 08/23/2022   10:14 AM 06/23/2022    2:06 PM 05/22/2022    9:47 AM 02/20/2022     1:03 PM  Fall Risk   Falls in the past year? 0 0 0 0 0  Number falls in past yr: 0  0 0 0  Injury with Fall? 0  0 0 0  Risk for fall due to : No Fall Risks  No Fall Risks No Fall Risks No Fall Risks  Follow up Education provided;Falls prevention discussed    Falls evaluation completed    FALL RISK PREVENTION PERTAINING TO THE HOME:  Any stairs in or around the home? Yes  If so, are there any without handrails? Yes  Home free of loose throw rugs in walkways, pet beds, electrical cords, etc? Yes  Adequate lighting in your home to reduce risk of falls? Yes   ASSISTIVE DEVICES UTILIZED TO PREVENT FALLS:  Life alert? No  Use of a cane, walker or w/c? No  Grab bars in the bathroom? No  Shower chair or bench in shower? Yes  Elevated toilet seat or a handicapped toilet? No   Cognitive Function:    12/24/2017    6:32 PM  MMSE - Mini Mental State Exam  Orientation to time 5  Orientation to Place 5  Registration 3  Attention/ Calculation 5  Recall 3  Language- name 2 objects 2  Language- repeat 1  Language- follow 3 step command 3  Language- read & follow direction 1  Write a sentence 1  Copy design 1  Total score 30        10/04/2022    2:16 PM 08/22/2018   10:36 AM  6CIT Screen  What Year? 0 points 0 points  What month? 0 points 0 points  What time? 0 points 0 points  Count back from 20 0 points 0 points  Months in reverse 0 points 0 points  Repeat phrase 0 points 0 points  Total Score 0 points 0 points    Immunizations Immunization History  Administered Date(s) Administered   Fluad Quad(high Dose 65+) 03/30/2021, 02/20/2022   Influenza, High Dose Seasonal PF 02/26/2018   Influenza,inj,Quad PF,6+ Mos 03/13/2019   Influenza,inj,quad, With Preservative 04/09/2020   Influenza-Unspecified 03/16/2014, 03/24/2016, 03/12/2017   PFIZER(Purple Top)SARS-COV-2 Vaccination 09/15/2019, 10/06/2019, 04/09/2020   Pfizer Covid-19 Vaccine Bivalent Booster 1yrs & up 07/01/2021    Pneumococcal Conjugate-13 09/25/2014   Pneumococcal Polysaccharide-23 11/16/2015, 02/15/2019   Tdap 09/25/2014   Zoster Recombinat (Shingrix) 07/01/2021    TDAP status: Up to date  Flu Vaccine status: Up to date  Pneumococcal vaccine status: Up to date  Covid-19 vaccine status: Completed vaccines  Qualifies for Shingles Vaccine? Yes   Zostavax completed Yes   Shingrix Completed?: Yes  Screening Tests Health Maintenance  Topic Date Due   COVID-19 Vaccine (5 - 2023-24 season) 02/10/2022   Zoster Vaccines- Shingrix (2 of 2) 02/01/2023 (Originally 08/26/2021)   COLONOSCOPY (Pts 45-8yrs Insurance coverage will need to be confirmed)  11/08/2022   INFLUENZA VACCINE  01/11/2023   OPHTHALMOLOGY EXAM  01/31/2023   HEMOGLOBIN A1C  02/25/2023   FOOT EXAM  05/23/2023   Diabetic kidney evaluation - Urine ACR  06/10/2023   Diabetic kidney evaluation - eGFR measurement  07/21/2023   Medicare Annual Wellness (AWV)  10/04/2023  MAMMOGRAM  09/06/2024   DTaP/Tdap/Td (2 - Td or Tdap) 09/24/2024   Pneumonia Vaccine 51+ Years old  Completed   DEXA SCAN  Completed   Hepatitis C Screening  Completed   HPV VACCINES  Aged Out    Health Maintenance  Health Maintenance Due  Topic Date Due   COVID-19 Vaccine (5 - 2023-24 season) 02/10/2022    Colorectal cancer screening: Type of screening: Colonoscopy. Completed yes. Repeat every 5-10 years  Mammogram status: Completed yes. Repeat every year  Bone Density status: Completed yes. Results reflect: Bone density results: NORMAL. Repeat every 5 years.  Lung Cancer Screening: (Low Dose CT Chest recommended if Age 92-80 years, 30 pack-year currently smoking OR have quit w/in 15years.) does not qualify.   Lung Cancer Screening Referral: no  Additional Screening:  Hepatitis C Screening: does not qualify; Completed yes  Vision Screening: Recommended annual ophthalmology exams for early detection of glaucoma and other disorders of the eye. Is the  patient up to date with their annual eye exam?  Yes  Who is the provider or what is the name of the office in which the patient attends annual eye exams? Dr Larence Penning If pt is not established with a provider, would they like to be referred to a provider to establish care? No .   Dental Screening: Recommended annual dental exams for proper oral hygiene  Community Resource Referral / Chronic Care Management: CRR required this visit?  No   CCM required this visit?  No     Plan:     I have personally reviewed and noted the following in the patient's chart:   Medical and social history Use of alcohol, tobacco or illicit drugs  Current medications and supplements including opioid prescriptions. Patient is not currently taking opioid prescriptions. Functional ability and status Nutritional status Physical activity Advanced directives List of other physicians Hospitalizations, surgeries, and ER visits in previous 12 months Vitals Screenings to include cognitive, depression, and falls Referrals and appointments  In addition, I have reviewed and discussed with patient certain preventive protocols, quality metrics, and best practice recommendations. A written personalized care plan for preventive services as well as general preventive health recommendations were provided to patient.    Sue Lush, LPN   [id number]   Nurse Notes: The patient states she is doing well and has no concerns or questions at this time.

## 2022-10-04 NOTE — Patient Instructions (Signed)
Cynthia Sims , Thank you for taking time to come for your Medicare Wellness Visit. I appreciate your ongoing commitment to your health goals. Please review the following plan we discussed and let me know if I can assist you in the future.   These are the goals we discussed:  Goals      DIET - REDUCE SUGAR INTAKE     Recommend monitoring amount of sugar consumed and avoiding sweets as much as possible.      Exercise 3x per week (30 min per time)     Recommend to exercise for 3 days a week for at least 30 minutes at a time.      LIFESTYLE - DECREASE FALLS RISK     Recommend to remove any items from the home that may cause slips or trips.        This is a list of the screening recommended for you and due dates:  Health Maintenance  Topic Date Due   COVID-19 Vaccine (5 - 2023-24 season) 02/10/2022   Zoster (Shingles) Vaccine (2 of 2) 02/01/2023*   Colon Cancer Screening  11/08/2022   Flu Shot  01/11/2023   Eye exam for diabetics  01/31/2023   Hemoglobin A1C  02/25/2023   Complete foot exam   05/23/2023   Yearly kidney health urinalysis for diabetes  06/10/2023   Yearly kidney function blood test for diabetes  07/21/2023   Medicare Annual Wellness Visit  10/04/2023   Mammogram  09/06/2024   DTaP/Tdap/Td vaccine (2 - Td or Tdap) 09/24/2024   Pneumonia Vaccine  Completed   DEXA scan (bone density measurement)  Completed   Hepatitis C Screening: USPSTF Recommendation to screen - Ages 33-79 yo.  Completed   HPV Vaccine  Aged Out  *Topic was postponed. The date shown is not the original due date.    Advanced directives: no  Conditions/risks identified: low falls risk  Next appointment: Follow up in one year for your annual wellness visit 10/09/2023  telephone   Preventive Care 65 Years and Older, Female Preventive care refers to lifestyle choices and visits with your health care provider that can promote health and wellness. What does preventive care include? A yearly  physical exam. This is also called an annual well check. Dental exams once or twice a year. Routine eye exams. Ask your health care provider how often you should have your eyes checked. Personal lifestyle choices, including: Daily care of your teeth and gums. Regular physical activity. Eating a healthy diet. Avoiding tobacco and drug use. Limiting alcohol use. Practicing safe sex. Taking low-dose aspirin every day. Taking vitamin and mineral supplements as recommended by your health care provider. What happens during an annual well check? The services and screenings done by your health care provider during your annual well check will depend on your age, overall health, lifestyle risk factors, and family history of disease. Counseling  Your health care provider may ask you questions about your: Alcohol use. Tobacco use. Drug use. Emotional well-being. Home and relationship well-being. Sexual activity. Eating habits. History of falls. Memory and ability to understand (cognition). Work and work Astronomer. Reproductive health. Screening  You may have the following tests or measurements: Height, weight, and BMI. Blood pressure. Lipid and cholesterol levels. These may be checked every 5 years, or more frequently if you are over 58 years old. Skin check. Lung cancer screening. You may have this screening every year starting at age 32 if you have a 30-pack-year history of smoking and  currently smoke or have quit within the past 15 years. Fecal occult blood test (FOBT) of the stool. You may have this test every year starting at age 49. Flexible sigmoidoscopy or colonoscopy. You may have a sigmoidoscopy every 5 years or a colonoscopy every 10 years starting at age 39. Hepatitis C blood test. Hepatitis B blood test. Sexually transmitted disease (STD) testing. Diabetes screening. This is done by checking your blood sugar (glucose) after you have not eaten for a while (fasting). You may  have this done every 1-3 years. Bone density scan. This is done to screen for osteoporosis. You may have this done starting at age 58. Mammogram. This may be done every 1-2 years. Talk to your health care provider about how often you should have regular mammograms. Talk with your health care provider about your test results, treatment options, and if necessary, the need for more tests. Vaccines  Your health care provider may recommend certain vaccines, such as: Influenza vaccine. This is recommended every year. Tetanus, diphtheria, and acellular pertussis (Tdap, Td) vaccine. You may need a Td booster every 10 years. Zoster vaccine. You may need this after age 90. Pneumococcal 13-valent conjugate (PCV13) vaccine. One dose is recommended after age 15. Pneumococcal polysaccharide (PPSV23) vaccine. One dose is recommended after age 47. Talk to your health care provider about which screenings and vaccines you need and how often you need them. This information is not intended to replace advice given to you by your health care provider. Make sure you discuss any questions you have with your health care provider. Document Released: 06/25/2015 Document Revised: 02/16/2016 Document Reviewed: 03/30/2015 Elsevier Interactive Patient Education  2017 [place] Prevention in the Home Falls can cause injuries. They can happen to people of all ages. There are many things you can do to make your home safe and to help prevent falls. What can I do on the outside of my home? Regularly fix the edges of walkways and driveways and fix any cracks. Remove anything that might make you trip as you walk through a door, such as a raised step or threshold. Trim any bushes or trees on the path to your home. Use bright outdoor lighting. Clear any walking paths of anything that might make someone trip, such as rocks or tools. Regularly check to see if handrails are loose or broken. Make sure that both sides of any  steps have handrails. Any raised decks and porches should have guardrails on the edges. Have any leaves, snow, or ice cleared regularly. Use sand or salt on walking paths during winter. Clean up any spills in your garage right away. This includes oil or grease spills. What can I do in the bathroom? Use night lights. Install grab bars by the toilet and in the tub and shower. Do not use towel bars as grab bars. Use non-skid mats or decals in the tub or shower. If you need to sit down in the shower, use a plastic, non-slip stool. Keep the floor dry. Clean up any water that spills on the floor as soon as it happens. Remove soap buildup in the tub or shower regularly. Attach bath mats securely with double-sided non-slip rug tape. Do not have throw rugs and other things on the floor that can make you trip. What can I do in the bedroom? Use night lights. Make sure that you have a light by your bed that is easy to reach. Do not use any sheets or blankets that are too  big for your bed. They should not hang down onto the floor. Have a firm chair that has side arms. You can use this for support while you get dressed. Do not have throw rugs and other things on the floor that can make you trip. What can I do in the kitchen? Clean up any spills right away. Avoid walking on wet floors. Keep items that you use a lot in easy-to-reach places. If you need to reach something above you, use a strong step stool that has a grab bar. Keep electrical cords out of the way. Do not use floor polish or wax that makes floors slippery. If you must use wax, use non-skid floor wax. Do not have throw rugs and other things on the floor that can make you trip. What can I do with my stairs? Do not leave any items on the stairs. Make sure that there are handrails on both sides of the stairs and use them. Fix handrails that are broken or loose. Make sure that handrails are as long as the stairways. Check any carpeting to  make sure that it is firmly attached to the stairs. Fix any carpet that is loose or worn. Avoid having throw rugs at the top or bottom of the stairs. If you do have throw rugs, attach them to the floor with carpet tape. Make sure that you have a light switch at the top of the stairs and the bottom of the stairs. If you do not have them, ask someone to add them for you. What else can I do to help prevent falls? Wear shoes that: Do not have high heels. Have rubber bottoms. Are comfortable and fit you well. Are closed at the toe. Do not wear sandals. If you use a stepladder: Make sure that it is fully opened. Do not climb a closed stepladder. Make sure that both sides of the stepladder are locked into place. Ask someone to hold it for you, if possible. Clearly mark and make sure that you can see: Any grab bars or handrails. First and last steps. Where the edge of each step is. Use tools that help you move around (mobility aids) if they are needed. These include: Canes. Walkers. Scooters. Crutches. Turn on the lights when you go into a dark area. Replace any light bulbs as soon as they burn out. Set up your furniture so you have a clear path. Avoid moving your furniture around. If any of your floors are uneven, fix them. If there are any pets around you, be aware of where they are. Review your medicines with your doctor. Some medicines can make you feel dizzy. This can increase your chance of falling. Ask your doctor what other things that you can do to help prevent falls. This information is not intended to replace advice given to you by your health care provider. Make sure you discuss any questions you have with your health care provider. Document Released: 03/25/2009 Document Revised: 11/04/2015 Document Reviewed: 07/03/2014 Elsevier Interactive Patient Education  2017 [employer].

## 2022-10-05 ENCOUNTER — Encounter (INDEPENDENT_AMBULATORY_CARE_PROVIDER_SITE_OTHER): Payer: Self-pay

## 2022-10-05 ENCOUNTER — Ambulatory Visit (INDEPENDENT_AMBULATORY_CARE_PROVIDER_SITE_OTHER): Payer: Medicare HMO | Admitting: Nurse Practitioner

## 2022-10-05 ENCOUNTER — Encounter (INDEPENDENT_AMBULATORY_CARE_PROVIDER_SITE_OTHER): Payer: Self-pay | Admitting: Nurse Practitioner

## 2022-10-05 ENCOUNTER — Ambulatory Visit (INDEPENDENT_AMBULATORY_CARE_PROVIDER_SITE_OTHER): Payer: Medicare HMO

## 2022-10-05 VITALS — BP 172/76 | HR 67 | Resp 18 | Ht 61.0 in | Wt 195.0 lb

## 2022-10-05 DIAGNOSIS — E0842 Diabetes mellitus due to underlying condition with diabetic polyneuropathy: Secondary | ICD-10-CM

## 2022-10-05 DIAGNOSIS — I739 Peripheral vascular disease, unspecified: Secondary | ICD-10-CM

## 2022-10-05 DIAGNOSIS — R9389 Abnormal findings on diagnostic imaging of other specified body structures: Secondary | ICD-10-CM

## 2022-10-05 DIAGNOSIS — I10 Essential (primary) hypertension: Secondary | ICD-10-CM

## 2022-10-05 NOTE — H&P (View-Only) (Signed)
 Subjective:    Patient ID: Cynthia Sims, female    DOB: 09/16/1950, 72 y.o.   MRN: [id number] Chief Complaint  Patient presents with   New Patient (Initial Visit)    Np consult & ABI PAD  Left 0.58 Right 0.48      Cynthia Sims is a 72-year-old female who presents today after a home health screening revealed decreased ABIs.  Prior to this, the patient has had claudication-like symptoms in her legs.  She notes that her calves become painful and uncomfortable with ambulation and activity.  She notes that she is only able to go short distances before this occurs.  She denies any classic rest pain like symptoms but she does have shooting and tingling in her legs from known neuropathy.  Currently the patient has no open wounds or ulcerations.  There are no symptoms of ischemia.  Today noninvasive studies show an ABI 0.71 on the right and 0.76 on the left.  There is a TBI 0.60 on the right and 0.51 on the left.  The patient has monophasic tibial artery waveforms bilaterally with slightly dampened toe waveforms bilaterally.   Review of Systems  Cardiovascular:  Positive for leg swelling.       Claudication  All other systems reviewed and are negative.      Objective:   Physical Exam Vitals reviewed.  HENT:     Head: Normocephalic.  Cardiovascular:     Rate and Rhythm: Normal rate.     Pulses:          Dorsalis pedis pulses are detected w/ Doppler on the right side and detected w/ Doppler on the left side.       Posterior tibial pulses are detected w/ Doppler on the right side and detected w/ Doppler on the left side.  Pulmonary:     Effort: Pulmonary effort is normal.  Skin:    General: Skin is warm and dry.  Neurological:     Mental Status: She is alert and oriented to person, place, and time.  Psychiatric:        Mood and Affect: Mood normal.        Behavior: Behavior normal.        Thought Content: Thought content normal.        Judgment: Judgment normal.    BP (!)  172/76 (BP Location: Right Arm)   Pulse 67   Resp 18   Ht 5' 1" (1.549 m)   Wt 195 lb (88.5 kg)   BMI 36.84 kg/m   Past Medical History:  Diagnosis Date   ALT (SGPT) level raised    Anemia    Anxiety    Arthritis    Depression    Diabetes mellitus without complication    Elevated cholesterol    GERD (gastroesophageal reflux disease)    Hemorrhoids    Hypertension    Kidney disease     Social History   Socioeconomic History   Marital status: Married    Spouse name: Not on file   Number of children: 5   Years of education: Not on file   Highest education level: 8th grade  Occupational History   Occupation: retired  Tobacco Use   Smoking status: Never    Passive exposure: Never   Smokeless tobacco: Never  Vaping Use   Vaping Use: Never used  Substance and Sexual Activity   Alcohol use: No    Alcohol/week: 0.0 standard drinks of alcohol   Drug use: No     Sexual activity: Yes    Birth control/protection: Post-menopausal  Other Topics Concern   Not on file  Social History Narrative   Applied for food stamps. Pt is living off social security money and is raising one of her grandchildren.    Social Determinants of Health   Financial Resource Strain: Low Risk  (10/04/2022)   Overall Financial Resource Strain (CARDIA)    Difficulty of Paying Living Expenses: Not hard at all  Food Insecurity: No Food Insecurity (10/04/2022)   Hunger Vital Sign    Worried About Running Out of Food in the Last Year: Never true    Ran Out of Food in the Last Year: Never true  Transportation Needs: No Transportation Needs (10/04/2022)   PRAPARE - Transportation    Lack of Transportation (Medical): No    Lack of Transportation (Non-Medical): No  Physical Activity: Inactive (10/04/2022)   Exercise Vital Sign    Days of Exercise per Week: 0 days    Minutes of Exercise per Session: 0 min  Stress: No Stress Concern Present (10/04/2022)   [employer] -  Occupational Stress Questionnaire    Feeling of Stress : Not at all  Social Connections: Moderately Integrated (10/04/2022)   Social Connection and Isolation Panel [NHANES]    Frequency of Communication with Friends and Family: More than three times a week    Frequency of Social Gatherings with Friends and Family: Once a week    Attends Religious Services: 1 to 4 times per year    Active Member of Clubs or Organizations: No    Attends Club or Organization Meetings: Never    Marital Status: Married  Intimate Partner Violence: Not At Risk (10/04/2022)   Humiliation, Afraid, Rape, and Kick questionnaire    Fear of Current or Ex-Partner: No    Emotionally Abused: No    Physically Abused: No    Sexually Abused: No    Past Surgical History:  Procedure Laterality Date   abdominal blockage     CESAREAN SECTION  1983   CESAREAN SECTION     CHOLECYSTECTOMY     COLONOSCOPY WITH PROPOFOL N/A 10/29/2015   Procedure: COLONOSCOPY WITH PROPOFOL;  Surgeon: Robert T Elliott, MD;  Location: [facility] END[facility]COPY;  Service: Endoscopy;  Laterality: N/A;   COLONOSCOPY WITH PROPOFOL N/A 11/07/2017   Procedure: COLONOSCOPY WITH PROPOFOL;  Surgeon: Toledo, Teodoro K, MD;  Location: [facility] ENDOSCOPY;  Service: Gastroenterology;  Laterality: N/A;   DILATION AND CURETTAGE OF UTERUS  2001   ESOPHAGOGASTRODUODENOSCOPY (EGD) WITH PROPOFOL N/A 10/29/2015   Procedure: ESOPHAGOGASTRODUODENOSCOPY (EGD) WITH PROPOFOL;  Surgeon: Robert T Elliott, MD;  Location: [facility] ENDOSCOPY;  Service: Endoscopy;  Laterality: N/A;    Family History  Problem Relation Age of Onset   Alzheimer's disease Mother    COPD Mother    Breast cancer Mother 40   Osteoporosis Mother    Heart attack Father    Lupus Father    Diabetes Father    Hypertension Father    Diabetes Sister    COPD Maternal Grandmother    Diabetes Paternal Grandfather    Diabetes Sister    Diabetes Sister     No Known Allergies     Latest Ref Rng & Units 06/09/2022     3:32 PM 09/22/2021   11:21 AM 10/18/2020   11:38 AM  CBC  WBC 3.4 - 10.8 x10E3/uL 5.4  6.1  6.1   Hemoglobin 11.1 - 15.9 g/dL 11.3  11.9  11.1   Hematocrit   34.0 - 46.6 % 33.4  35.6  33.1   Platelets 150 - 450 x10E3/uL 146  164  159       CMP     Component Value Date/Time   NA 135 07/20/2022 1126   NA 140 06/09/2022 1532   NA 135 (L) 03/01/2012 2236   K 4.5 07/20/2022 1126   K 4.4 03/01/2012 2236   CL 104 07/20/2022 1126   CL 103 03/01/2012 2236   CO2 22 07/20/2022 1126   CO2 18 (L) 03/01/2012 2236   GLUCOSE 82 07/20/2022 1126   GLUCOSE 494 (H) 03/01/2012 2236   BUN 24 (H) 07/20/2022 1126   BUN 24 06/09/2022 1532   BUN 23 (H) 03/01/2012 2236   CREATININE 1.39 (H) 07/20/2022 1126   CREATININE 1.72 (H) 03/01/2012 2236   CALCIUM 8.3 (L) 07/20/2022 1126   CALCIUM 8.3 (L) 03/01/2012 2236   PROT 6.8 06/09/2022 1532   PROT 6.7 03/01/2012 2236   ALBUMIN 4.2 06/09/2022 1532   ALBUMIN 3.2 (L) 03/01/2012 2236   AST 12 06/09/2022 1532   AST 47 (H) 03/01/2012 2236   ALT 14 06/09/2022 1532   ALT 56 03/01/2012 2236   ALKPHOS 76 06/09/2022 1532   ALKPHOS 75 03/01/2012 2236   BILITOT <0.2 06/09/2022 1532   BILITOT 0.5 03/01/2012 2236   GFRNONAA 40 (L) 07/20/2022 1126   GFRNONAA 31 (L) 03/01/2012 2236   GFRAA 41 (L) 10/06/2019 1150   GFRAA 36 (L) 03/01/2012 2236     No results found.     Assessment & Plan:   1. Peripheral arterial disease I had a long discussion with the patient and daughter-in-law to discuss the history and pathophysiology peripheral arterial disease.  Results discussed with the patient.  We discussed treatment options including angiogram versus conservative therapy.  At this time the patient wishes to go home and discussed with her family she will contact us to let us know if she wishes to proceed with angiogram.  If not we will closely follow and have her return with studies in 6 months.  Addendum: Following discussion with the family she wishes to move  forward with angiogram.  Recommend:  The patient has evidence of severe atherosclerotic changes of both lower extremities with rest pain that is associated with preulcerative changes and impending tissue loss of the bilateral feet.  This represents a limb threatening ischemia and places the patient at the risk for bilateral limb loss.  Patient should undergo angiography of the bilateral lower extremity with the hope for intervention for limb salvage.  The risks and benefits as well as the alternative therapies was discussed in detail with the patient.  All questions were answered.  Patient agrees to proceed with right followed by left lower extremity angiography.  The patient will follow up with me in the office after the procedure.      2. Essential hypertension Continue antihypertensive medications as already ordered, these medications have been reviewed and there are no changes at this time.  3. Diabetes mellitus due to underlying condition with diabetic polyneuropathy, without long-term current use of insulin Continue hypoglycemic medications as already ordered, these medications have been reviewed and there are no changes at this time.  Hgb A1C to be monitored as already arranged by primary service   Current Outpatient Medications on File Prior to Visit  Medication Sig Dispense Refill   acetaminophen (TYLENOL) 500 MG tablet Take 500 mg by mouth every 6 (six) hours as needed.        aspirin EC 81 MG tablet Take 81 mg by mouth daily.      carvedilol (COREG) 12.5 MG tablet TAKE 1 TABLET BY MOUTH TWICE DAILY WITH A MEAL 180 tablet 1   cholestyramine (QUESTRAN) 4 g packet DISSOLVE AND TAKE ONE PACKET BY MOUTH THREE TIMES DAILY 270 each 1   cyanocobalamin (,VITAMIN B-12,) 1000 MCG/ML injection Inject into the muscle daily.     DULoxetine (CYMBALTA) 60 MG capsule Take 1 capsule by mouth once daily 90 capsule 1   gabapentin (NEURONTIN) 100 MG capsule Take 1 capsule (100 mg total) by mouth 3  (three) times daily. 90 capsule 2   glipiZIDE (GLUCOTROL) 10 MG tablet Take 1 tablet (10 mg total) by mouth 2 (two) times daily. 180 tablet 3   glucose blood (ONETOUCH ULTRA) test strip 1 each by Other route 3 (three) times daily. Use as instructed 100 each 2   Insulin Glargine (BASAGLAR KWIKPEN) 100 UNIT/ML INJECT 24 UNITS SUBCUTANEOUSLY ONCE DAILY 15 mL 2   irbesartan (AVAPRO) 300 MG tablet Take 1 tablet (300 mg total) by mouth daily. 90 tablet 3   isosorbide mononitrate (IMDUR) 30 MG 24 hr tablet Take 1 tablet (30 mg total) by mouth daily. 90 tablet 3   Lancets (ONETOUCH DELICA PLUS LANCET33G) MISC USE 1  TO CHECK GLUCOSE ONCE DAILY 100 each 3   lovastatin (MEVACOR) 40 MG tablet Take 1 tablet by mouth once daily 90 tablet 1   metFORMIN (GLUCOPHAGE) 500 MG tablet Take 1 tablet (500 mg total) by mouth 2 (two) times daily with a meal. 180 tablet 3   nitroGLYCERIN (NITROSTAT) 0.4 MG SL tablet Place 1 tablet (0.4 mg total) under the tongue every 5 (five) minutes as needed for chest pain. 25 tablet 3   omeprazole (PRILOSEC) 40 MG capsule Take 1 capsule (40 mg total) by mouth daily. 90 capsule 0   No current facility-administered medications on file prior to visit.    There are no Patient Instructions on file for this visit. No follow-ups on file.   Keoni Havey E Sharai Overbay, NP   

## 2022-10-05 NOTE — H&P (View-Only) (Signed)
 Subjective:    Patient ID: Cynthia Sims, female    DOB: 06/11/1951, 72 y.o.   MRN: [id number] Chief Complaint  Patient presents with   New Patient (Initial Visit)    Np consult & ABI PAD  Left 0.58 Right 0.48      Cynthia Sims is a 72-year-old female who presents today after a home health screening revealed decreased ABIs.  Prior to this, the patient has had claudication-like symptoms in her legs.  She notes that her calves become painful and uncomfortable with ambulation and activity.  She notes that she is only able to go short distances before this occurs.  She denies any classic rest pain like symptoms but she does have shooting and tingling in her legs from known neuropathy.  Currently the patient has no open wounds or ulcerations.  There are no symptoms of ischemia.  Today noninvasive studies show an ABI 0.71 on the right and 0.76 on the left.  There is a TBI 0.60 on the right and 0.51 on the left.  The patient has monophasic tibial artery waveforms bilaterally with slightly dampened toe waveforms bilaterally.   Review of Systems  Cardiovascular:  Positive for leg swelling.       Claudication  All other systems reviewed and are negative.      Objective:   Physical Exam Vitals reviewed.  HENT:     Head: Normocephalic.  Cardiovascular:     Rate and Rhythm: Normal rate.     Pulses:          Dorsalis pedis pulses are detected w/ Doppler on the right side and detected w/ Doppler on the left side.       Posterior tibial pulses are detected w/ Doppler on the right side and detected w/ Doppler on the left side.  Pulmonary:     Effort: Pulmonary effort is normal.  Skin:    General: Skin is warm and dry.  Neurological:     Mental Status: She is alert and oriented to person, place, and time.  Psychiatric:        Mood and Affect: Mood normal.        Behavior: Behavior normal.        Thought Content: Thought content normal.        Judgment: Judgment normal.    BP (!)  172/76 (BP Location: Right Arm)   Pulse 67   Resp 18   Ht 5' 1" (1.549 m)   Wt 195 lb (88.5 kg)   BMI 36.84 kg/m   Past Medical History:  Diagnosis Date   ALT (SGPT) level raised    Anemia    Anxiety    Arthritis    Depression    Diabetes mellitus without complication    Elevated cholesterol    GERD (gastroesophageal reflux disease)    Hemorrhoids    Hypertension    Kidney disease     Social History   Socioeconomic History   Marital status: Married    Spouse name: Not on file   Number of children: 5   Years of education: Not on file   Highest education level: 8th grade  Occupational History   Occupation: retired  Tobacco Use   Smoking status: Never    Passive exposure: Never   Smokeless tobacco: Never  Vaping Use   Vaping Use: Never used  Substance and Sexual Activity   Alcohol use: No    Alcohol/week: 0.0 standard drinks of alcohol   Drug use: No     Sexual activity: Yes    Birth control/protection: Post-menopausal  Other Topics Concern   Not on file  Social History Narrative   Applied for food stamps. Pt is living off social security money and is raising one of her grandchildren.    Social Determinants of Health   Financial Resource Strain: Low Risk  (10/04/2022)   Overall Financial Resource Strain (CARDIA)    Difficulty of Paying Living Expenses: Not hard at all  Food Insecurity: No Food Insecurity (10/04/2022)   Hunger Vital Sign    Worried About Running Out of Food in the Last Year: Never true    Ran Out of Food in the Last Year: Never true  Transportation Needs: No Transportation Needs (10/04/2022)   PRAPARE - Transportation    Lack of Transportation (Medical): No    Lack of Transportation (Non-Medical): No  Physical Activity: Inactive (10/04/2022)   Exercise Vital Sign    Days of Exercise per Week: 0 days    Minutes of Exercise per Session: 0 min  Stress: No Stress Concern Present (10/04/2022)   [employer] -  Occupational Stress Questionnaire    Feeling of Stress : Not at all  Social Connections: Moderately Integrated (10/04/2022)   Social Connection and Isolation Panel [NHANES]    Frequency of Communication with Friends and Family: More than three times a week    Frequency of Social Gatherings with Friends and Family: Once a week    Attends Religious Services: 1 to 4 times per year    Active Member of Clubs or Organizations: No    Attends Club or Organization Meetings: Never    Marital Status: Married  Intimate Partner Violence: Not At Risk (10/04/2022)   Humiliation, Afraid, Rape, and Kick questionnaire    Fear of Current or Ex-Partner: No    Emotionally Abused: No    Physically Abused: No    Sexually Abused: No    Past Surgical History:  Procedure Laterality Date   abdominal blockage     CESAREAN SECTION  1983   CESAREAN SECTION     CHOLECYSTECTOMY     COLONOSCOPY WITH PROPOFOL N/A 10/29/2015   Procedure: COLONOSCOPY WITH PROPOFOL;  Surgeon: Robert T Elliott, MD;  Location: [facility] ENDOSCOPY;  Service: Endoscopy;  Laterality: N/A;   COLONOSCOPY WITH PROPOFOL N/A 11/07/2017   Procedure: COLONOSCOPY WITH PROPOFOL;  Surgeon: Toledo, Teodoro K, MD;  Location: [facility] ENDOSCOPY;  Service: Gastroenterology;  Laterality: N/A;   DILATION AND CURETTAGE OF UTERUS  2001   ESOPHAGOGASTRODUODENOSCOPY (EGD) WITH PROPOFOL N/A 10/29/2015   Procedure: ESOPHAGOGASTRODUODENOSCOPY (EGD) WITH PROPOFOL;  Surgeon: Robert T Elliott, MD;  Location: [facility] ENDOSCOPY;  Service: Endoscopy;  Laterality: N/A;    Family History  Problem Relation Age of Onset   Alzheimer's disease Mother    COPD Mother    Breast cancer Mother 40   Osteoporosis Mother    Heart attack Father    Lupus Father    Diabetes Father    Hypertension Father    Diabetes Sister    COPD Maternal Grandmother    Diabetes Paternal Grandfather    Diabetes Sister    Diabetes Sister     No Known Allergies     Latest Ref Rng & Units 06/09/2022     3:32 PM 09/22/2021   11:21 AM 10/18/2020   11:38 AM  CBC  WBC 3.4 - 10.8 x10E3/uL 5.4  6.1  6.1   Hemoglobin 11.1 - 15.9 g/dL 11.3  11.9  11.1   Hematocrit   34.0 - 46.6 % 33.4  35.6  33.1   Platelets 150 - 450 x10E3/uL 146  164  159       CMP     Component Value Date/Time   NA 135 07/20/2022 1126   NA 140 06/09/2022 1532   NA 135 (L) 03/01/2012 2236   K 4.5 07/20/2022 1126   K 4.4 03/01/2012 2236   CL 104 07/20/2022 1126   CL 103 03/01/2012 2236   CO2 22 07/20/2022 1126   CO2 18 (L) 03/01/2012 2236   GLUCOSE 82 07/20/2022 1126   GLUCOSE 494 (H) 03/01/2012 2236   BUN 24 (H) 07/20/2022 1126   BUN 24 06/09/2022 1532   BUN 23 (H) 03/01/2012 2236   CREATININE 1.39 (H) 07/20/2022 1126   CREATININE 1.72 (H) 03/01/2012 2236   CALCIUM 8.3 (L) 07/20/2022 1126   CALCIUM 8.3 (L) 03/01/2012 2236   PROT 6.8 06/09/2022 1532   PROT 6.7 03/01/2012 2236   ALBUMIN 4.2 06/09/2022 1532   ALBUMIN 3.2 (L) 03/01/2012 2236   AST 12 06/09/2022 1532   AST 47 (H) 03/01/2012 2236   ALT 14 06/09/2022 1532   ALT 56 03/01/2012 2236   ALKPHOS 76 06/09/2022 1532   ALKPHOS 75 03/01/2012 2236   BILITOT <0.2 06/09/2022 1532   BILITOT 0.5 03/01/2012 2236   GFRNONAA 40 (L) 07/20/2022 1126   GFRNONAA 31 (L) 03/01/2012 2236   GFRAA 41 (L) 10/06/2019 1150   GFRAA 36 (L) 03/01/2012 2236     No results found.     Assessment & Plan:   1. Peripheral arterial disease I had a long discussion with the patient and daughter-in-law to discuss the history and pathophysiology peripheral arterial disease.  Results discussed with the patient.  We discussed treatment options including angiogram versus conservative therapy.  At this time the patient wishes to go home and discussed with her family she will contact us to let us know if she wishes to proceed with angiogram.  If not we will closely follow and have her return with studies in 6 months.  Addendum: Following discussion with the family she wishes to move  forward with angiogram.  Recommend:  The patient has evidence of severe atherosclerotic changes of both lower extremities with rest pain that is associated with preulcerative changes and impending tissue loss of the bilateral feet.  This represents a limb threatening ischemia and places the patient at the risk for bilateral limb loss.  Patient should undergo angiography of the bilateral lower extremity with the hope for intervention for limb salvage.  The risks and benefits as well as the alternative therapies was discussed in detail with the patient.  All questions were answered.  Patient agrees to proceed with right followed by left lower extremity angiography.  The patient will follow up with me in the office after the procedure.      2. Essential hypertension Continue antihypertensive medications as already ordered, these medications have been reviewed and there are no changes at this time.  3. Diabetes mellitus due to underlying condition with diabetic polyneuropathy, without long-term current use of insulin Continue hypoglycemic medications as already ordered, these medications have been reviewed and there are no changes at this time.  Hgb A1C to be monitored as already arranged by primary service   Current Outpatient Medications on File Prior to Visit  Medication Sig Dispense Refill   acetaminophen (TYLENOL) 500 MG tablet Take 500 mg by mouth every 6 (six) hours as needed.        aspirin EC 81 MG tablet Take 81 mg by mouth daily.      carvedilol (COREG) 12.5 MG tablet TAKE 1 TABLET BY MOUTH TWICE DAILY WITH A MEAL 180 tablet 1   cholestyramine (QUESTRAN) 4 g packet DISSOLVE AND TAKE ONE PACKET BY MOUTH THREE TIMES DAILY 270 each 1   cyanocobalamin (,VITAMIN B-12,) 1000 MCG/ML injection Inject into the muscle daily.     DULoxetine (CYMBALTA) 60 MG capsule Take 1 capsule by mouth once daily 90 capsule 1   gabapentin (NEURONTIN) 100 MG capsule Take 1 capsule (100 mg total) by mouth 3  (three) times daily. 90 capsule 2   glipiZIDE (GLUCOTROL) 10 MG tablet Take 1 tablet (10 mg total) by mouth 2 (two) times daily. 180 tablet 3   glucose blood (ONETOUCH ULTRA) test strip 1 each by Other route 3 (three) times daily. Use as instructed 100 each 2   Insulin Glargine (BASAGLAR KWIKPEN) 100 UNIT/ML INJECT 24 UNITS SUBCUTANEOUSLY ONCE DAILY 15 mL 2   irbesartan (AVAPRO) 300 MG tablet Take 1 tablet (300 mg total) by mouth daily. 90 tablet 3   isosorbide mononitrate (IMDUR) 30 MG 24 hr tablet Take 1 tablet (30 mg total) by mouth daily. 90 tablet 3   Lancets (ONETOUCH DELICA PLUS LANCET33G) MISC USE 1  TO CHECK GLUCOSE ONCE DAILY 100 each 3   lovastatin (MEVACOR) 40 MG tablet Take 1 tablet by mouth once daily 90 tablet 1   metFORMIN (GLUCOPHAGE) 500 MG tablet Take 1 tablet (500 mg total) by mouth 2 (two) times daily with a meal. 180 tablet 3   nitroGLYCERIN (NITROSTAT) 0.4 MG SL tablet Place 1 tablet (0.4 mg total) under the tongue every 5 (five) minutes as needed for chest pain. 25 tablet 3   omeprazole (PRILOSEC) 40 MG capsule Take 1 capsule (40 mg total) by mouth daily. 90 capsule 0   No current facility-administered medications on file prior to visit.    There are no Patient Instructions on file for this visit. No follow-ups on file.   Kewana Sanon E Aiyah Scarpelli, NP   

## 2022-10-05 NOTE — Progress Notes (Addendum)
Subjective:    Patient ID: Cynthia Sims, female    DOB: 12-12-1950, 72 y.o.   MRN: [id number] Chief Complaint  Patient presents with   New Patient (Initial Visit)    Np consult & ABI PAD  Left 0.58 Right 0.48      Cynthia Sims is a 72 year old female who presents today after a home health screening revealed decreased ABIs.  Prior to this, the patient has had claudication-like symptoms in her legs.  She notes that her calves become painful and uncomfortable with ambulation and activity.  She notes that she is only able to go short distances before this occurs.  She denies any classic rest pain like symptoms but she does have shooting and tingling in her legs from known neuropathy.  Currently the patient has no open wounds or ulcerations.  There are no symptoms of ischemia.  Today noninvasive studies show an ABI 0.71 on the right and 0.76 on the left.  There is a TBI 0.60 on the right and 0.51 on the left.  The patient has monophasic tibial artery waveforms bilaterally with slightly dampened toe waveforms bilaterally.   Review of Systems  Cardiovascular:  Positive for leg swelling.       Claudication  All other systems reviewed and are negative.      Objective:   Physical Exam Vitals reviewed.  HENT:     Head: Normocephalic.  Cardiovascular:     Rate and Rhythm: Normal rate.     Pulses:          Dorsalis pedis pulses are detected w/ Doppler on the right side and detected w/ Doppler on the left side.       Posterior tibial pulses are detected w/ Doppler on the right side and detected w/ Doppler on the left side.  Pulmonary:     Effort: Pulmonary effort is normal.  Skin:    General: Skin is warm and dry.  Neurological:     Mental Status: She is alert and oriented to person, place, and time.  Psychiatric:        Mood and Affect: Mood normal.        Behavior: Behavior normal.        Thought Content: Thought content normal.        Judgment: Judgment normal.    BP (!)  172/76 (BP Location: Right Arm)   Pulse 67   Resp 18   Ht 5\' 1"  (1.549 m)   Wt 195 lb (88.5 kg)   BMI 36.84 kg/m   Past Medical History:  Diagnosis Date   ALT (SGPT) level raised    Anemia    Anxiety    Arthritis    Depression    Diabetes mellitus without complication    Elevated cholesterol    GERD (gastroesophageal reflux disease)    Hemorrhoids    Hypertension    Kidney disease     Social History   Socioeconomic History   Marital status: Married    Spouse name: Not on file   Number of children: 5   Years of education: Not on file   Highest education level: 8th grade  Occupational History   Occupation: retired  Tobacco Use   Smoking status: Never    Passive exposure: Never   Smokeless tobacco: Never  Vaping Use   Vaping Use: Never used  Substance and Sexual Activity   Alcohol use: No    Alcohol/week: 0.0 standard drinks of alcohol   Drug use: No  Sexual activity: Yes    Birth control/protection: Post-menopausal  Other Topics Concern   Not on file  Social History Narrative   Applied for food stamps. Pt is living off social security money and is raising one of her grandchildren.    Social Determinants of Health   Financial Resource Strain: Low Risk  (10/04/2022)   Overall Financial Resource Strain (CARDIA)    Difficulty of Paying Living Expenses: Not hard at all  Food Insecurity: No Food Insecurity (10/04/2022)   Hunger Vital Sign    Worried About Running Out of Food in the Last Year: Never true    Ran Out of Food in the Last Year: Never true  Transportation Needs: No Transportation Needs (10/04/2022)   PRAPARE - Administrator, Civil Service (Medical): No    Lack of Transportation (Non-Medical): No  Physical Activity: Inactive (10/04/2022)   Exercise Vital Sign    Days of Exercise per Week: 0 days    Minutes of Exercise per Session: 0 min  Stress: No Stress Concern Present (10/04/2022)   [employer] of Occupational Health -  Occupational Stress Questionnaire    Feeling of Stress : Not at all  Social Connections: Moderately Integrated (10/04/2022)   Social Connection and Isolation Panel [NHANES]    Frequency of Communication with Friends and Family: More than three times a week    Frequency of Social Gatherings with Friends and Family: Once a week    Attends Religious Services: 1 to 4 times per year    Active Member of [employer] or Organizations: No    Attends Banker Meetings: Never    Marital Status: Married  Catering manager Violence: Not At Risk (10/04/2022)   Humiliation, Afraid, Rape, and Kick questionnaire    Fear of Current or Ex-Partner: No    Emotionally Abused: No    Physically Abused: No    Sexually Abused: No    Past Surgical History:  Procedure Laterality Date   abdominal blockage     CESAREAN SECTION  1983   CESAREAN SECTION     CHOLECYSTECTOMY     COLONOSCOPY WITH PROPOFOL N/A 10/29/2015   Procedure: COLONOSCOPY WITH PROPOFOL;  Surgeon: Scot Jun, MD;  Location: [facility] ENDOSCOPY;  Service: Endoscopy;  Laterality: N/A;   COLONOSCOPY WITH PROPOFOL N/A 11/07/2017   Procedure: COLONOSCOPY WITH PROPOFOL;  Surgeon: Toledo, Boykin Nearing, MD;  Location: [facility] ENDOSCOPY;  Service: Gastroenterology;  Laterality: N/A;   DILATION AND CURETTAGE OF UTERUS  2001   ESOPHAGOGASTRODUODENOSCOPY (EGD) WITH PROPOFOL N/A 10/29/2015   Procedure: ESOPHAGOGASTRODUODENOSCOPY (EGD) WITH PROPOFOL;  Surgeon: Scot Jun, MD;  Location: [facility] ENDOSCOPY;  Service: Endoscopy;  Laterality: N/A;    Family History  Problem Relation Age of Onset   Alzheimer's disease Mother    COPD Mother    Breast cancer Mother 33   Osteoporosis Mother    Heart attack Father    Lupus Father    Diabetes Father    Hypertension Father    Diabetes Sister    COPD Maternal Grandmother    Diabetes Paternal Grandfather    Diabetes Sister    Diabetes Sister     No Known Allergies     Latest Ref Rng & Units 06/09/2022     3:32 PM 09/22/2021   11:21 AM 10/18/2020   11:38 AM  CBC  WBC 3.4 - 10.8 x10E3/uL 5.4  6.1  6.1   Hemoglobin 11.1 - 15.9 g/dL 16.1  09.6  04.5   Hematocrit  34.0 - 46.6 % 33.4  35.6  33.1   Platelets 150 - 450 x10E3/uL 146  164  159       CMP     Component Value Date/Time   NA 135 07/20/2022 1126   NA 140 06/09/2022 1532   NA 135 (L) 03/01/2012 2236   K 4.5 07/20/2022 1126   K 4.4 03/01/2012 2236   CL 104 07/20/2022 1126   CL 103 03/01/2012 2236   CO2 22 07/20/2022 1126   CO2 18 (L) 03/01/2012 2236   GLUCOSE 82 07/20/2022 1126   GLUCOSE 494 (H) 03/01/2012 2236   BUN 24 (H) 07/20/2022 1126   BUN 24 06/09/2022 1532   BUN 23 (H) 03/01/2012 2236   CREATININE 1.39 (H) 07/20/2022 1126   CREATININE 1.72 (H) 03/01/2012 2236   CALCIUM 8.3 (L) 07/20/2022 1126   CALCIUM 8.3 (L) 03/01/2012 2236   PROT 6.8 06/09/2022 1532   PROT 6.7 03/01/2012 2236   ALBUMIN 4.2 06/09/2022 1532   ALBUMIN 3.2 (L) 03/01/2012 2236   AST 12 06/09/2022 1532   AST 47 (H) 03/01/2012 2236   ALT 14 06/09/2022 1532   ALT 56 03/01/2012 2236   ALKPHOS 76 06/09/2022 1532   ALKPHOS 75 03/01/2012 2236   BILITOT <0.2 06/09/2022 1532   BILITOT 0.5 03/01/2012 2236   GFRNONAA 40 (L) 07/20/2022 1126   GFRNONAA 31 (L) 03/01/2012 2236   GFRAA 41 (L) 10/06/2019 1150   GFRAA 36 (L) 03/01/2012 2236     No results found.     Assessment & Plan:   1. Peripheral arterial disease I had a long discussion with the patient and daughter-in-law to discuss the history and pathophysiology peripheral arterial disease.  Results discussed with the patient.  We discussed treatment options including angiogram versus conservative therapy.  At this time the patient wishes to go home and discussed with her family she will contact us to let us know if she wishes to proceed with angiogram.  If not we will closely follow and have her return with studies in 6 months.  Addendum: Following discussion with the family she wishes to move  forward with angiogram.  Recommend:  The patient has evidence of severe atherosclerotic changes of both lower extremities with rest pain that is associated with preulcerative changes and impending tissue loss of the bilateral feet.  This represents a limb threatening ischemia and places the patient at the risk for bilateral limb loss.  Patient should undergo angiography of the bilateral lower extremity with the hope for intervention for limb salvage.  The risks and benefits as well as the alternative therapies was discussed in detail with the patient.  All questions were answered.  Patient agrees to proceed with right followed by left lower extremity angiography.  The patient will follow up with me in the office after the procedure.      2. Essential hypertension Continue antihypertensive medications as already ordered, these medications have been reviewed and there are no changes at this time.  3. Diabetes mellitus due to underlying condition with diabetic polyneuropathy, without long-term current use of insulin Continue hypoglycemic medications as already ordered, these medications have been reviewed and there are no changes at this time.  Hgb A1C to be monitored as already arranged by primary service   Current Outpatient Medications on File Prior to Visit  Medication Sig Dispense Refill   acetaminophen (TYLENOL) 500 MG tablet Take 500 mg by mouth every 6 (six) hours as needed.  aspirin EC 81 MG tablet Take 81 mg by mouth daily.      carvedilol (COREG) 12.5 MG tablet TAKE 1 TABLET BY MOUTH TWICE DAILY WITH A MEAL 180 tablet 1   cholestyramine (QUESTRAN) 4 g packet DISSOLVE AND TAKE ONE PACKET BY MOUTH THREE TIMES DAILY 270 each 1   cyanocobalamin (,VITAMIN B-12,) 1000 MCG/ML injection Inject into the muscle daily.     DULoxetine (CYMBALTA) 60 MG capsule Take 1 capsule by mouth once daily 90 capsule 1   gabapentin (NEURONTIN) 100 MG capsule Take 1 capsule (100 mg total) by mouth 3  (three) times daily. 90 capsule 2   glipiZIDE (GLUCOTROL) 10 MG tablet Take 1 tablet (10 mg total) by mouth 2 (two) times daily. 180 tablet 3   glucose blood (ONETOUCH ULTRA) test strip 1 each by Other route 3 (three) times daily. Use as instructed 100 each 2   Insulin Glargine (BASAGLAR KWIKPEN) 100 UNIT/ML INJECT 24 UNITS SUBCUTANEOUSLY ONCE DAILY 15 mL 2   irbesartan (AVAPRO) 300 MG tablet Take 1 tablet (300 mg total) by mouth daily. 90 tablet 3   isosorbide mononitrate (IMDUR) 30 MG 24 hr tablet Take 1 tablet (30 mg total) by mouth daily. 90 tablet 3   Lancets (ONETOUCH DELICA PLUS LANCET33G) MISC USE 1  TO CHECK GLUCOSE ONCE DAILY 100 each 3   lovastatin (MEVACOR) 40 MG tablet Take 1 tablet by mouth once daily 90 tablet 1   metFORMIN (GLUCOPHAGE) 500 MG tablet Take 1 tablet (500 mg total) by mouth 2 (two) times daily with a meal. 180 tablet 3   nitroGLYCERIN (NITROSTAT) 0.4 MG SL tablet Place 1 tablet (0.4 mg total) under the tongue every 5 (five) minutes as needed for chest pain. 25 tablet 3   omeprazole (PRILOSEC) 40 MG capsule Take 1 capsule (40 mg total) by mouth daily. 90 capsule 0   No current facility-administered medications on file prior to visit.    There are no Patient Instructions on file for this visit. No follow-ups on file.   Georgiana Spinner, NP

## 2022-10-10 DIAGNOSIS — I10 Essential (primary) hypertension: Secondary | ICD-10-CM | POA: Diagnosis not present

## 2022-10-10 DIAGNOSIS — E119 Type 2 diabetes mellitus without complications: Secondary | ICD-10-CM | POA: Diagnosis not present

## 2022-10-11 ENCOUNTER — Ambulatory Visit: Payer: Medicare HMO | Attending: Cardiovascular Disease | Admitting: Cardiovascular Disease

## 2022-10-11 VITALS — BP 142/68 | HR 70 | Ht 61.0 in | Wt 194.8 lb

## 2022-10-11 DIAGNOSIS — I10 Essential (primary) hypertension: Secondary | ICD-10-CM

## 2022-10-11 MED ORDER — CARVEDILOL 25 MG PO TABS: 25.0000 mg | ORAL_TABLET | Freq: Two times a day (BID) | ORAL | 2 refills | Status: DC

## 2022-10-11 NOTE — Patient Instructions (Signed)
Medication Instructions:  Your physician has recommended you make the following change in your medication:   1) INCREASE carvedilol (Coreg) to 25mg  twice daily  *If you need a refill on your cardiac medications before your next appointment, please call your pharmacy*   Lab Work: None  Testing/Procedures: None  Follow-Up: Keep follow-up appointments as scheduled.  Other Instructions Check blood pressure at home over the 1-2 weeks and call readings (or send via MyChart message) for Cynthia Sims to review. If your heart rate is consistently less than 60 call our office.

## 2022-10-11 NOTE — Progress Notes (Unsigned)
   Nurse Visit   Date of Encounter: 10/11/2022 ID: Cynthia Sims, DOB 03-Jul-1950, MRN [id number]  PCP:  [name], MD   [facility] Providers Cardiologist:  Christell Constant, MD { Click to update primary MD,subspecialty MD or APP then REFRESH:1}     Visit Details   VS:  BP (!) 142/68   Pulse 70   Ht 5\' 1"  (1.549 m)   Wt 194 lb 12.8 oz (88.4 kg)   SpO2 99%   BMI 36.81 kg/m  , BMI Body mass index is 36.81 kg/m.  Wt Readings from Last 3 Encounters:  10/11/22 194 lb 12.8 oz (88.4 kg)  10/05/22 195 lb (88.5 kg)  10/04/22 196 lb (88.9 kg)     Reason for visit: HTN (BP Check) Performed today: Vitals, Provider consulted:Tessa Conte, PA-C, and Education Changes (medications, testing, etc.) : Increase carvedilol (Coreg) to 25mg  twice daily. Length of Visit: 25 minutes    Medications Adjustments/Labs and Tests Ordered: No orders of the defined types were placed in this encounter.  Meds ordered this encounter  Medications   carvedilol (COREG) 25 MG tablet    Sig: Take 1 tablet (25 mg total) by mouth 2 (two) times daily.    Dispense:  60 tablet    Refill:  2    Dose change (increased)   Patient is here for nurse visit to check blood pressure. She reports high readings at home (SBP 150-200). BP with home cuff 149/71 today in clinic, manual reading by nurse 142/68, P 70. Patient complains of continued intermittent chest tightness that lasts less than a minutes, at times coinciding with elevated BP readings. Reviewed with Jari Favre, PA-C: increase carvedilol to 25mg  twice daily. Patient instructed to monitor BP and HR over the next 1-2 weeks and report readings to our office. Instructed patient to call if HR is consistently less than 60 bpm.  Signed, Franchot Gallo, RN  10/11/2022 3:12 PM

## 2022-10-13 DIAGNOSIS — H524 Presbyopia: Secondary | ICD-10-CM | POA: Diagnosis not present

## 2022-10-13 DIAGNOSIS — H5203 Hypermetropia, bilateral: Secondary | ICD-10-CM | POA: Diagnosis not present

## 2022-10-13 DIAGNOSIS — Z0100 Encounter for examination of eyes and vision without abnormal findings: Secondary | ICD-10-CM | POA: Diagnosis not present

## 2022-10-13 DIAGNOSIS — Z961 Presence of intraocular lens: Secondary | ICD-10-CM | POA: Diagnosis not present

## 2022-10-13 DIAGNOSIS — H35033 Hypertensive retinopathy, bilateral: Secondary | ICD-10-CM | POA: Diagnosis not present

## 2022-10-13 DIAGNOSIS — E113393 Type 2 diabetes mellitus with moderate nonproliferative diabetic retinopathy without macular edema, bilateral: Secondary | ICD-10-CM | POA: Diagnosis not present

## 2022-10-13 DIAGNOSIS — H52223 Regular astigmatism, bilateral: Secondary | ICD-10-CM | POA: Diagnosis not present

## 2022-10-13 LAB — HM DIABETES EYE EXAM

## 2022-10-14 DIAGNOSIS — I10 Essential (primary) hypertension: Secondary | ICD-10-CM | POA: Diagnosis not present

## 2022-10-14 DIAGNOSIS — E119 Type 2 diabetes mellitus without complications: Secondary | ICD-10-CM | POA: Diagnosis not present

## 2022-10-16 ENCOUNTER — Telehealth (INDEPENDENT_AMBULATORY_CARE_PROVIDER_SITE_OTHER): Payer: Self-pay

## 2022-10-16 NOTE — Telephone Encounter (Signed)
Spoke with the patient and she is scheduled with Dr. Gilda Crease on 10/24/22 (6:45 am arrival) and 10/31/22 (6:45 am arrival) for a RLE and LLE angio at the [facility]. Pre-procedure instructions were discussed and will be sent to Mychart and mailed.

## 2022-10-17 ENCOUNTER — Encounter: Payer: Self-pay | Admitting: Family Medicine

## 2022-10-17 DIAGNOSIS — I10 Essential (primary) hypertension: Secondary | ICD-10-CM | POA: Diagnosis not present

## 2022-10-17 DIAGNOSIS — E113399 Type 2 diabetes mellitus with moderate nonproliferative diabetic retinopathy without macular edema, unspecified eye: Secondary | ICD-10-CM | POA: Insufficient documentation

## 2022-10-17 DIAGNOSIS — H35033 Hypertensive retinopathy, bilateral: Secondary | ICD-10-CM | POA: Insufficient documentation

## 2022-10-17 DIAGNOSIS — E119 Type 2 diabetes mellitus without complications: Secondary | ICD-10-CM | POA: Diagnosis not present

## 2022-10-18 ENCOUNTER — Encounter: Payer: Self-pay | Admitting: Family Medicine

## 2022-10-24 ENCOUNTER — Encounter
Admission: RE | Disposition: A | Discharge status: Home / Self Care | Payer: Self-pay | Source: Home / Self Care | Attending: Vascular Surgery

## 2022-10-24 ENCOUNTER — Ambulatory Visit
Admission: RE | Admit: 2022-10-24 | Discharge: 2022-10-24 | Disposition: A | Discharge status: Home / Self Care | Payer: Medicare HMO | Attending: Vascular Surgery | Admitting: Vascular Surgery

## 2022-10-24 ENCOUNTER — Encounter: Payer: Self-pay | Admitting: Vascular Surgery

## 2022-10-24 ENCOUNTER — Other Ambulatory Visit: Payer: Self-pay

## 2022-10-24 DIAGNOSIS — Z7984 Long term (current) use of oral hypoglycemic drugs: Secondary | ICD-10-CM | POA: Diagnosis not present

## 2022-10-24 DIAGNOSIS — I70229 Atherosclerosis of native arteries of extremities with rest pain, unspecified extremity: Secondary | ICD-10-CM

## 2022-10-24 DIAGNOSIS — I700 Atherosclerosis of aorta: Secondary | ICD-10-CM

## 2022-10-24 DIAGNOSIS — I70223 Atherosclerosis of native arteries of extremities with rest pain, bilateral legs: Secondary | ICD-10-CM | POA: Diagnosis not present

## 2022-10-24 DIAGNOSIS — I10 Essential (primary) hypertension: Secondary | ICD-10-CM | POA: Diagnosis not present

## 2022-10-24 DIAGNOSIS — I70221 Atherosclerosis of native arteries of extremities with rest pain, right leg: Secondary | ICD-10-CM

## 2022-10-24 DIAGNOSIS — I70212 Atherosclerosis of native arteries of extremities with intermittent claudication, left leg: Secondary | ICD-10-CM | POA: Diagnosis not present

## 2022-10-24 DIAGNOSIS — E1142 Type 2 diabetes mellitus with diabetic polyneuropathy: Secondary | ICD-10-CM | POA: Insufficient documentation

## 2022-10-24 HISTORY — PX: LOWER EXTREMITY ANGIOGRAPHY: CATH118251

## 2022-10-24 LAB — BUN: BUN: 25 mg/dL — ABNORMAL HIGH (ref 8–23)

## 2022-10-24 LAB — CREATININE, SERUM
Creatinine, Ser: 1.56 mg/dL — ABNORMAL HIGH (ref 0.44–1.00)
GFR, Estimated: 35 mL/min — ABNORMAL LOW (ref >60)

## 2022-10-24 LAB — GLUCOSE, CAPILLARY: Glucose-Capillary: 132 mg/dL — ABNORMAL HIGH (ref 70–99)

## 2022-10-24 SURGERY — LOWER EXTREMITY ANGIOGRAPHY
Anesthesia: Moderate Sedation | Site: Leg Lower | Laterality: Right

## 2022-10-24 MED ORDER — ONDANSETRON HCL 4 MG/2ML IJ SOLN: 4.0000 mg | Freq: Four times a day (QID) | INTRAMUSCULAR | Status: DC | PRN

## 2022-10-24 MED ORDER — LABETALOL HCL 5 MG/ML IV SOLN: 10.0000 mg | INTRAVENOUS | Status: DC | PRN

## 2022-10-24 MED ORDER — METHYLPREDNISOLONE SODIUM SUCC 125 MG IJ SOLR: 125.0000 mg | Freq: Once | INTRAMUSCULAR | Status: DC | PRN

## 2022-10-24 MED ORDER — CEFAZOLIN SODIUM-DEXTROSE 1-4 GM/50ML-% IV SOLN
INTRAVENOUS | Status: DC | PRN
  Administered 2022-10-24: 2 g via INTRAVENOUS

## 2022-10-24 MED ORDER — FENTANYL CITRATE (PF) 100 MCG/2ML IJ SOLN
INTRAMUSCULAR | Status: AC
  Filled 2022-10-24: qty 2

## 2022-10-24 MED ORDER — OXYCODONE HCL 5 MG PO TABS: 5.0000 mg | ORAL_TABLET | ORAL | Status: DC | PRN

## 2022-10-24 MED ORDER — HEPARIN SODIUM (PORCINE) 1000 UNIT/ML IJ SOLN
INTRAMUSCULAR | Status: DC | PRN
  Administered 2022-10-24: 6000 [IU] via INTRAVENOUS

## 2022-10-24 MED ORDER — CLOPIDOGREL BISULFATE 75 MG PO TABS: 75.0000 mg | ORAL_TABLET | Freq: Every day | ORAL | 5 refills | Status: DC

## 2022-10-24 MED ORDER — SODIUM CHLORIDE 0.9% FLUSH: 3.0000 mL | INTRAVENOUS | Status: DC | PRN

## 2022-10-24 MED ORDER — MIDAZOLAM HCL 2 MG/2ML IJ SOLN
INTRAMUSCULAR | Status: DC | PRN
  Administered 2022-10-24: 2 mg via INTRAVENOUS
  Administered 2022-10-24: 1 mg via INTRAVENOUS

## 2022-10-24 MED ORDER — HYDROMORPHONE HCL 1 MG/ML IJ SOLN: 1.0000 mg | Freq: Once | INTRAMUSCULAR | Status: DC | PRN

## 2022-10-24 MED ORDER — CEFAZOLIN SODIUM-DEXTROSE 2-4 GM/100ML-% IV SOLN: 2.0000 g | INTRAVENOUS | Status: DC

## 2022-10-24 MED ORDER — IODIXANOL 320 MG/ML IV SOLN
INTRAVENOUS | Status: DC | PRN
  Administered 2022-10-24: 65 mL

## 2022-10-24 MED ORDER — CEFAZOLIN SODIUM-DEXTROSE 2-4 GM/100ML-% IV SOLN
INTRAVENOUS | Status: AC
  Filled 2022-10-24: qty 100

## 2022-10-24 MED ORDER — ACETAMINOPHEN 325 MG PO TABS: 650.0000 mg | ORAL_TABLET | ORAL | Status: DC | PRN

## 2022-10-24 MED ORDER — CLOPIDOGREL BISULFATE 300 MG PO TABS: 300.0000 mg | ORAL_TABLET | ORAL | Status: DC

## 2022-10-24 MED ORDER — MIDAZOLAM HCL 5 MG/5ML IJ SOLN
INTRAMUSCULAR | Status: AC
  Filled 2022-10-24: qty 5

## 2022-10-24 MED ORDER — MIDAZOLAM HCL 2 MG/ML PO SYRP: 8.0000 mg | ORAL_SOLUTION | Freq: Once | ORAL | Status: DC | PRN

## 2022-10-24 MED ORDER — DIPHENHYDRAMINE HCL 50 MG/ML IJ SOLN: 50.0000 mg | Freq: Once | INTRAMUSCULAR | Status: DC | PRN

## 2022-10-24 MED ORDER — HEPARIN SODIUM (PORCINE) 1000 UNIT/ML IJ SOLN
INTRAMUSCULAR | Status: AC
  Filled 2022-10-24: qty 10

## 2022-10-24 MED ORDER — SODIUM CHLORIDE 0.9 % IV SOLN: 250.0000 mL | INTRAVENOUS | Status: DC | PRN

## 2022-10-24 MED ORDER — SODIUM CHLORIDE 0.9 % IV SOLN: INTRAVENOUS | Status: DC

## 2022-10-24 MED ORDER — PANTOPRAZOLE SODIUM 40 MG PO TBEC: 40.0000 mg | DELAYED_RELEASE_TABLET | Freq: Every day | ORAL | 11 refills | Status: DC

## 2022-10-24 MED ORDER — FENTANYL CITRATE (PF) 100 MCG/2ML IJ SOLN
INTRAMUSCULAR | Status: DC | PRN
  Administered 2022-10-24: 25 ug via INTRAVENOUS
  Administered 2022-10-24: 50 ug via INTRAVENOUS

## 2022-10-24 MED ORDER — FAMOTIDINE 20 MG PO TABS: 40.0000 mg | ORAL_TABLET | Freq: Once | ORAL | Status: DC | PRN

## 2022-10-24 MED ORDER — SODIUM CHLORIDE 0.9% FLUSH: 3.0000 mL | Freq: Two times a day (BID) | INTRAVENOUS | Status: DC

## 2022-10-24 MED ORDER — MORPHINE SULFATE (PF) 4 MG/ML IV SOLN: 2.0000 mg | INTRAVENOUS | Status: DC | PRN

## 2022-10-24 MED ORDER — HYDRALAZINE HCL 20 MG/ML IJ SOLN: 5.0000 mg | INTRAMUSCULAR | Status: DC | PRN

## 2022-10-24 SURGICAL SUPPLY — 29 items
BALLN LUTONIX 018 4X220X130 (BALLOONS) ×1
BALLN LUTONIX 018 4X300X130 (BALLOONS) ×1
BALLN LUTONIX 5X220X130 (BALLOONS) ×2
BALLN ULTRASCORE 014 3X200X150 (BALLOONS) ×1
BALLOON LUTONIX 018 4X220X130 (BALLOONS) IMPLANT
BALLOON LUTONIX 018 4X300X130 (BALLOONS) IMPLANT
BALLOON LUTONIX 5X220X130 (BALLOONS) IMPLANT
BALLOON ULTRSCRE 014 3X200X150 (BALLOONS) IMPLANT
CATH ANGIO 5F PIGTAIL 65CM (CATHETERS) IMPLANT
CATH BEACON 5 .038 100 VERT TP (CATHETERS) IMPLANT
CATH TEMPO 5F RIM 65CM (CATHETERS) IMPLANT
COVER PROBE ULTRASOUND 5X96 (MISCELLANEOUS) IMPLANT
DEVICE STARCLOSE SE CLOSURE (Vascular Products) IMPLANT
GLIDEWIRE ADV .035X260CM (WIRE) IMPLANT
GOWN STRL REUS W/ TWL LRG LVL3 (GOWN DISPOSABLE) ×1 IMPLANT
GOWN STRL REUS W/TWL LRG LVL3 (GOWN DISPOSABLE) ×1
KIT ENCORE 26 ADVANTAGE (KITS) IMPLANT
LIFESTENT SOLO 6X200X135 (Permanent Stent) IMPLANT
NDL ENTRY 21GA 7CM ECHOTIP (NEEDLE) IMPLANT
NEEDLE ENTRY 21GA 7CM ECHOTIP (NEEDLE) ×1 IMPLANT
PACK ANGIOGRAPHY (CUSTOM PROCEDURE TRAY) ×1 IMPLANT
SET INTRO CAPELLA COAXIAL (SET/KITS/TRAYS/PACK) IMPLANT
SHEATH ANL2 6FRX45 HC (SHEATH) IMPLANT
SHEATH BRITE TIP 5FRX11 (SHEATH) IMPLANT
SYR MEDRAD MARK 7 150ML (SYRINGE) IMPLANT
TUBING CONTRAST HIGH PRESS 72 (TUBING) IMPLANT
WIRE GUIDERIGHT .035X150 (WIRE) IMPLANT
WIRE RUNTHROUGH .014X300CM (WIRE) IMPLANT
WIRE SUPRACORE 300CM (WIRE) IMPLANT

## 2022-10-24 NOTE — Interval H&P Note (Signed)
History and Physical Interval Note:  10/24/2022 8:00 AM  Cynthia Sims  has presented today for surgery, with the diagnosis of RLE Angio  BARD   ASO w rest pain.  The various methods of treatment have been discussed with the patient and family. After consideration of risks, benefits and other options for treatment, the patient has consented to  Procedure(s): Lower Extremity Angiography (Right) as a surgical intervention.  The patient's history has been reviewed, patient examined, no change in status, stable for surgery.  I have reviewed the patient's chart and labs.  Questions were answered to the patient's satisfaction.     [name]

## 2022-10-24 NOTE — Op Note (Signed)
[facility]  Percutaneous Study/Intervention Procedural Note   Date of Surgery: 10/24/2022  Surgeon:  Renford Dills, MD.  Pre-operative Diagnosis: Atherosclerotic occlusive disease bilateral lower extremities with lifestyle limiting claudication and rest pain symptoms right leg more severe than left.  Post-operative diagnosis:  Same  Procedure(s) Performed:             1.  Introduction catheter into right lower extremity 3rd order catheter placement              2.    Contrast injection right lower extremity for distal runoff             3.  Percutaneous transluminal angioplasty and stent placement right superficial femoral artery and popliteal             4.  Star close closure left common femoral arteriotomy  Anesthesia: Conscious sedation was administered under my direct supervision by the interventional radiology RN. IV Versed plus fentanyl were utilized. Continuous ECG, pulse oximetry and blood pressure was monitored throughout the entire procedure.  Conscious sedation was for a total of 1 hour 9 minutes and 20 seconds.  Sheath: 6 [place] Ansell left common femoral retrograde  Contrast: 65 cc  Fluoroscopy Time: 11.6 minutes  Indications:  Cynthia Sims presents with lifestyle limiting claudication and rest pain symptoms right leg more severe than left.noninvasive studies as well as physical examination supports severe atherosclerotic changes.  Angiography with hope for intervention is recommended to prevent limb loss.  The risks and benefits are reviewed all questions answered patient agrees to proceed.  Procedure:  Cynthia Sims is a 72 y.o. y.o. female who was identified and appropriate procedural time out was performed.  The patient was then placed supine on the table and prepped and draped in the usual sterile fashion.    Ultrasound was placed in the sterile sleeve and the left groin was evaluated the left common femoral artery was echolucent and  pulsatile indicating patency.  Image was recorded for the permanent record and under real-time visualization a microneedle was inserted into the common femoral artery microwire followed by a micro-sheath.  A J-wire was then advanced through the micro-sheath and a  5 [place] sheath was then inserted over a J-wire. J-wire was then advanced and a 5 French pigtail catheter was positioned at the level of T12. AP projection of the aorta was then obtained. Pigtail catheter was repositioned to above the bifurcation and a LAO view of the pelvis was obtained.  Subsequently a rim catheter with the stiff angle Glidewire was used to cross the aortic bifurcation the catheter wire were advanced down into the right distal external iliac artery. Oblique view of the femoral bifurcation was then obtained and subsequently the wire was reintroduced and the pigtail catheter negotiated into the SFA representing third order catheter placement. Distal runoff was then performed.  6000 units of heparin was then given and allowed to circulate and a 6 [place] Ansell sheath was advanced up and over the bifurcation and positioned in the femoral artery  KMP  catheter and stiff angle Glidewire were then negotiated down into the distal popliteal.  Distal runoff was then completed by hand injection through the catheter.  A 0.014 run-through wire was then reintroduced and a 3 mm by the 200 mm ultra score balloon was used to angioplasty the superficial femoral and popliteal arteries.  2 inflations were to 10 atm atmospheres for 1 minute. Follow-up imaging demonstrated patency with adequate  preparation of the vessel for a drug-coated balloon.  Subsequently, a 4 mm x 300 Lutonix balloon was utilized inflating to 8 atm for 1 minute, again 2 separate inflations were required. Follow-up imaging demonstrated greater than 50% residual stenosis and therefore a 6 mm x 200 mm life stent was deployed beginning at about the level of the femoral condyles and  extending proximally.  The second stent was then deployed overlapping the initial 1 and bringing the stent in its proximal edge or leading edge up to the origin of the SFA.  The stents were subsequently postdilated with a 5 mm Lutonix drug-eluting balloons. Distal runoff was then reassessed.  After review of these images the sheath is pulled into the left external iliac oblique of the common femoral is obtained and a Star close device deployed. There no immediate complications.   Findings:  The abdominal aorta is opacified with a bolus injection contrast. Renal arteries are single and widely patent. The aorta itself has diffusely disease but no hemodynamically significant lesions, this is especially noted in the mid infrarenal aorta although it does not appear to achieve hemodynamic significance. The common and external iliac arteries are widely patent bilaterally.  The right common femoral is widely patent as is the profunda femoris.  The SFA does indeed have multiple hemodynamically significant stenosis that extend into the popliteal artery.  The SFA and popliteal are diffusely diseased with long segments of greater than 50% stenosis and intervening 4 or 5 areas of greater than 80% stenosis including 1 just distal to the origin of the SFA.  The trifurcation is moderately diseased with three-vessel runoff to the foot.  The tibioperoneal trunk and posterior tibial are widely patent and the dominant runoff.  Peroneal is patent but does not contribute significantly to the foot at the level of the ankle.  The anterior tibial is patent all the way down to the dorsalis pedis which then appears to be occluded.  Filling of the pedal arch is via the plantar arteries fed by the posterior tibial.  Following angioplasty and stent placement the right superficial femoral-popliteal arteries are now widely patent with less than 10% residual stenosis.    Summary: Successful recanalization right lower extremity for limb  salvage                        Disposition: Patient was taken to the recovery room in stable condition having tolerated the procedure well.  Rhayne Chatwin, Latina Craver 10/24/2022,9:51 AM

## 2022-10-24 NOTE — Progress Notes (Signed)
Pt sitting up and eating and drinking, family at bedside

## 2022-10-25 ENCOUNTER — Encounter: Payer: Self-pay | Admitting: Vascular Surgery

## 2022-10-31 ENCOUNTER — Encounter
Admission: RE | Disposition: A | Discharge status: Home / Self Care | Payer: Self-pay | Source: Home / Self Care | Attending: Vascular Surgery

## 2022-10-31 ENCOUNTER — Encounter: Payer: Self-pay | Admitting: Vascular Surgery

## 2022-10-31 ENCOUNTER — Ambulatory Visit
Admission: RE | Admit: 2022-10-31 | Discharge: 2022-10-31 | Disposition: A | Discharge status: Home / Self Care | Payer: Medicare HMO | Attending: Vascular Surgery | Admitting: Vascular Surgery

## 2022-10-31 ENCOUNTER — Other Ambulatory Visit: Payer: Self-pay

## 2022-10-31 DIAGNOSIS — E1151 Type 2 diabetes mellitus with diabetic peripheral angiopathy without gangrene: Secondary | ICD-10-CM | POA: Diagnosis not present

## 2022-10-31 DIAGNOSIS — I70212 Atherosclerosis of native arteries of extremities with intermittent claudication, left leg: Secondary | ICD-10-CM | POA: Diagnosis not present

## 2022-10-31 DIAGNOSIS — Z9889 Other specified postprocedural states: Secondary | ICD-10-CM

## 2022-10-31 DIAGNOSIS — I70222 Atherosclerosis of native arteries of extremities with rest pain, left leg: Secondary | ICD-10-CM | POA: Insufficient documentation

## 2022-10-31 DIAGNOSIS — Z794 Long term (current) use of insulin: Secondary | ICD-10-CM | POA: Diagnosis not present

## 2022-10-31 DIAGNOSIS — I10 Essential (primary) hypertension: Secondary | ICD-10-CM | POA: Insufficient documentation

## 2022-10-31 DIAGNOSIS — Z7984 Long term (current) use of oral hypoglycemic drugs: Secondary | ICD-10-CM | POA: Insufficient documentation

## 2022-10-31 DIAGNOSIS — I70229 Atherosclerosis of native arteries of extremities with rest pain, unspecified extremity: Secondary | ICD-10-CM

## 2022-10-31 DIAGNOSIS — E1140 Type 2 diabetes mellitus with diabetic neuropathy, unspecified: Secondary | ICD-10-CM | POA: Insufficient documentation

## 2022-10-31 HISTORY — PX: LOWER EXTREMITY ANGIOGRAPHY: CATH118251

## 2022-10-31 LAB — CREATININE, SERUM
Creatinine, Ser: 1.62 mg/dL — ABNORMAL HIGH (ref 0.44–1.00)
GFR, Estimated: 33 mL/min — ABNORMAL LOW (ref >60)

## 2022-10-31 LAB — GLUCOSE, CAPILLARY
Glucose-Capillary: 101 mg/dL — ABNORMAL HIGH (ref 70–99)
Glucose-Capillary: 55 mg/dL — ABNORMAL LOW (ref 70–99)
Glucose-Capillary: 72 mg/dL (ref 70–99)

## 2022-10-31 LAB — BUN: BUN: 17 mg/dL (ref 8–23)

## 2022-10-31 SURGERY — LOWER EXTREMITY ANGIOGRAPHY
Anesthesia: Moderate Sedation | Site: Leg Lower | Laterality: Left

## 2022-10-31 MED ORDER — HYDROMORPHONE HCL 1 MG/ML IJ SOLN: 1.0000 mg | Freq: Once | INTRAMUSCULAR | Status: DC | PRN

## 2022-10-31 MED ORDER — HEPARIN SODIUM (PORCINE) 1000 UNIT/ML IJ SOLN
INTRAMUSCULAR | Status: DC | PRN
  Administered 2022-10-31: 6000 [IU] via INTRAVENOUS

## 2022-10-31 MED ORDER — ACETAMINOPHEN 325 MG PO TABS: 650.0000 mg | ORAL_TABLET | ORAL | Status: DC | PRN

## 2022-10-31 MED ORDER — MIDAZOLAM HCL 2 MG/ML PO SYRP: 8.0000 mg | ORAL_SOLUTION | Freq: Once | ORAL | Status: DC | PRN

## 2022-10-31 MED ORDER — MIDAZOLAM HCL 2 MG/2ML IJ SOLN
INTRAMUSCULAR | Status: DC | PRN
  Administered 2022-10-31: 2 mg via INTRAVENOUS
  Administered 2022-10-31: .5 mg via INTRAVENOUS

## 2022-10-31 MED ORDER — SODIUM CHLORIDE 0.9% FLUSH: 3.0000 mL | INTRAVENOUS | Status: DC | PRN

## 2022-10-31 MED ORDER — FENTANYL CITRATE (PF) 100 MCG/2ML IJ SOLN
INTRAMUSCULAR | Status: AC
  Filled 2022-10-31: qty 2

## 2022-10-31 MED ORDER — SODIUM CHLORIDE 0.9 % IV SOLN: INTRAVENOUS | Status: DC

## 2022-10-31 MED ORDER — METHYLPREDNISOLONE SODIUM SUCC 125 MG IJ SOLR: 125.0000 mg | Freq: Once | INTRAMUSCULAR | Status: DC | PRN

## 2022-10-31 MED ORDER — HYDRALAZINE HCL 20 MG/ML IJ SOLN: 5.0000 mg | INTRAMUSCULAR | Status: DC | PRN

## 2022-10-31 MED ORDER — SODIUM CHLORIDE 0.9 % IV SOLN: 250.0000 mL | INTRAVENOUS | Status: DC | PRN

## 2022-10-31 MED ORDER — HEPARIN SODIUM (PORCINE) 1000 UNIT/ML IJ SOLN
INTRAMUSCULAR | Status: AC
  Filled 2022-10-31: qty 10

## 2022-10-31 MED ORDER — ONDANSETRON HCL 4 MG/2ML IJ SOLN: 4.0000 mg | Freq: Four times a day (QID) | INTRAMUSCULAR | Status: DC | PRN

## 2022-10-31 MED ORDER — FENTANYL CITRATE (PF) 100 MCG/2ML IJ SOLN
INTRAMUSCULAR | Status: DC | PRN
  Administered 2022-10-31: 25 ug via INTRAVENOUS
  Administered 2022-10-31: 50 ug via INTRAVENOUS

## 2022-10-31 MED ORDER — MORPHINE SULFATE (PF) 4 MG/ML IV SOLN: 2.0000 mg | INTRAVENOUS | Status: DC | PRN

## 2022-10-31 MED ORDER — MIDAZOLAM HCL 2 MG/2ML IJ SOLN
INTRAMUSCULAR | Status: AC
  Filled 2022-10-31: qty 4

## 2022-10-31 MED ORDER — CEFAZOLIN SODIUM-DEXTROSE 2-4 GM/100ML-% IV SOLN
2.0000 g | INTRAVENOUS | Status: AC
  Administered 2022-10-31: 2 g via INTRAVENOUS

## 2022-10-31 MED ORDER — CEFAZOLIN SODIUM-DEXTROSE 2-4 GM/100ML-% IV SOLN
INTRAVENOUS | Status: AC
  Filled 2022-10-31: qty 100

## 2022-10-31 MED ORDER — DIPHENHYDRAMINE HCL 50 MG/ML IJ SOLN: 50.0000 mg | Freq: Once | INTRAMUSCULAR | Status: DC | PRN

## 2022-10-31 MED ORDER — LABETALOL HCL 5 MG/ML IV SOLN: 10.0000 mg | INTRAVENOUS | Status: DC | PRN

## 2022-10-31 MED ORDER — OXYCODONE HCL 5 MG PO TABS: 5.0000 mg | ORAL_TABLET | ORAL | Status: DC | PRN

## 2022-10-31 MED ORDER — SODIUM CHLORIDE 0.9% FLUSH: 3.0000 mL | Freq: Two times a day (BID) | INTRAVENOUS | Status: DC

## 2022-10-31 MED ORDER — FAMOTIDINE 20 MG PO TABS: 40.0000 mg | ORAL_TABLET | Freq: Once | ORAL | Status: DC | PRN

## 2022-10-31 SURGICAL SUPPLY — 27 items
BALLN LUTONIX 5X150X130 (BALLOONS) ×2
BALLN LUTONIX DCB 4X100X130 (BALLOONS) ×1
BALLN LUTONIX DCB 5X80X130 (BALLOONS) ×1
BALLN ULTRASCORE 4X100X130 (BALLOONS) ×1
BALLOON LUTONIX 5X150X130 (BALLOONS) IMPLANT
BALLOON LUTONIX DCB 4X100X130 (BALLOONS) IMPLANT
BALLOON LUTONIX DCB 5X80X130 (BALLOONS) IMPLANT
BALLOON ULTRASCORE 4X100X130 (BALLOONS) IMPLANT
CATH ANGIO 5F PIGTAIL 65CM (CATHETERS) IMPLANT
CATH VERT 5FR 125CM (CATHETERS) IMPLANT
COVER PROBE ULTRASOUND 5X96 (MISCELLANEOUS) IMPLANT
DEVICE STARCLOSE SE CLOSURE (Vascular Products) IMPLANT
GLIDEWIRE ADV .035X260CM (WIRE) IMPLANT
GOWN STRL REUS W/ TWL LRG LVL3 (GOWN DISPOSABLE) ×1 IMPLANT
GOWN STRL REUS W/TWL LRG LVL3 (GOWN DISPOSABLE) ×1
KIT ENCORE 26 ADVANTAGE (KITS) IMPLANT
LIFESTENT SOLO 6X200X135 (Permanent Stent) IMPLANT
NDL ENTRY 21GA 7CM ECHOTIP (NEEDLE) IMPLANT
NEEDLE ENTRY 21GA 7CM ECHOTIP (NEEDLE) ×1 IMPLANT
PACK ANGIOGRAPHY (CUSTOM PROCEDURE TRAY) ×1 IMPLANT
SET INTRO CAPELLA COAXIAL (SET/KITS/TRAYS/PACK) IMPLANT
SHEATH ANL2 6FRX45 HC (SHEATH) IMPLANT
SHEATH BRITE TIP 5FRX11 (SHEATH) IMPLANT
STENT LIFESTENT 5F 6X80X135 (Permanent Stent) IMPLANT
SYR MEDRAD MARK 7 150ML (SYRINGE) IMPLANT
WIRE GUIDERIGHT .035X150 (WIRE) IMPLANT
WIRE SUPRACORE 300CM (WIRE) IMPLANT

## 2022-10-31 NOTE — Op Note (Signed)
[facility]  Percutaneous Study/Intervention Procedural Note   Date of Surgery: 10/31/2022  Surgeon:  Renford Dills, MD.  Pre-operative Diagnosis: Atherosclerotic occlusive disease bilateral lower extremities with lifestyle limiting claudication of the left lower extremity  Post-operative diagnosis:  Same  Procedure(s) Performed:             1.  Introduction catheter into left lower extremity 3rd order catheter placement              2.    Contrast injection left lower extremity for distal runoff             3.  Percutaneous transluminal angioplasty and stent placement left superficial femoral artery and popliteal             4.  Star close closure right common femoral arteriotomy  Anesthesia: Conscious sedation was administered under my direct supervision by the interventional radiology RN. IV Versed plus fentanyl were utilized. Continuous ECG, pulse oximetry and blood pressure was monitored throughout the entire procedure.  Conscious sedation was for a total of 50 minutes.  Sheath: 6 [place] Ansell right common femoral antegrade  Contrast: 55 cc  Fluoroscopy Time: 6.1 minutes  Indications:  Cynthia Sims presents with known atherosclerotic occlusive disease of the left lower extremity.  She recently underwent treatment of her right lower extremity for rest pain symptoms.  She has had such a dramatic improvement in her right lower extremity that she is now requesting treatment of her left lower extremity.  The risks and benefits are reviewed all questions answered patient agrees to proceed.  Procedure:  Cynthia Sims is a 72 y.o. y.o. female who was identified and appropriate procedural time out was performed.  The patient was then placed supine on the table and prepped and draped in the usual sterile fashion.    Ultrasound was placed in the sterile sleeve and the right groin was evaluated the right common femoral artery was echolucent and pulsatile  indicating patency.  Image was recorded for the permanent record and under real-time visualization a microneedle was inserted into the common femoral artery microwire followed by a micro-sheath.  A J-wire was then advanced through the micro-sheath and a  5 [place] sheath was then inserted over a J-wire. J-wire was then advanced and a 5 French pigtail catheter was positioned in the distal aorta.  Using the J-wire and the pigtail catheter I crossed the aortic bifurcation and advanced the pigtail catheter down into the distal left external iliac artery.  LAO view of the femoral bifurcation was then obtained and subsequently the wire was reintroduced and the pigtail catheter negotiated into the SFA representing third order catheter placement. Distal runoff was then performed.  6000 units of heparin was then given and allowed to circulate and a 6 [place] Ansell sheath was advanced up and over the bifurcation and positioned in the femoral artery  KMP  catheter and advantage Glidewire were then negotiated down into the distal popliteal.  Distal runoff was then completed by hand injection through the catheter.  A supra core wire was then reintroduced and a magnified imaging of the distal SFA and popliteal artery was then performed.  A 6 mm x 200 mm life stent was then deployed with its distal edge at the level of the tibial plateau extending proximally.  Distally the stent was postdilated with a 4 mm x 100 mm Lutonix drug-eluting balloon inflated to 10 to 12 atm for approximately 1 minute.  Approximately a 5 mm x 150 mm Lutonix drug-eluting balloon was then used to treat the stent and fully expanded from its proximal edge to its distal edge.  Inflation was to 10 to 12 atm for approximately 1 minute.  Follow-up imaging demonstrated less than 10% residual stenosis in the detector was repositioned to the femoral bifurcation.  Steep LAO projection was obtained.  A 4 mm x 100 mm ultra score balloon was then advanced across the  proximal SFA and inflated to 8 atm for 1 minute.  Next a 5 mm x 150 mm Lutonix drug-eluting balloon was used to treat this area and again inflated to 10 atm for approximately 1 minute.  Follow-up imaging demonstrated the ostia was widely patent no evidence of any dissection at all.  In the mid portion of the treated area there was a greater than 50% residual stenosis and therefore a 6 mm x 80 mm life stent was deployed across this area postdilated with a 5 mm x 80 mm Lutonix drug-eluting balloon.  Inflation was to 8 atm for 1 minute.  Distal runoff was then reassessed and the SFA was found to be widely patent with less than 10% residual stenosis throughout trifurcation is preserved.  After review of these images the sheath is pulled into the right external iliac oblique of the common femoral is obtained and a Star close device deployed. There no immediate complications.   Findings:    The left common femoral is widely patent as is the profunda femoris.  The SFA does indeed have a significant stenosis at its ostium several centimeters distal there is another 60 to 70% stenosis then beginning at Hunter's canal there is diffuse 80 to 90% stenosis extending through the proximal and mid popliteal artery.  This covers a length of approximately 150 to 200 mm.  There is a focal area in the midportion of this there is a string sign greater than 90%.  The trifurcation is widely patent there is three-vessel runoff to the foot with the posterior tibial being dominant.  Following angioplasty and stent placement in the SFA at 2 locations approximately and then distally at Hunter's canal extending into the popliteal there is less than 10% residual stenosis.  Trifurcation is preserved.    Summary: Successful recanalization left lower extremity for limb salvage                        Disposition: Patient was taken to the recovery room in stable condition having tolerated the procedure well.  Alok Minshall, Latina Craver 10/31/2022,11:25 AM

## 2022-10-31 NOTE — Interval H&P Note (Signed)
History and Physical Interval Note:  10/31/2022 7:50 AM  Cynthia Sims  has presented today for surgery, with the diagnosis of LLE Angio   BARD   ASO w rest pain.  The various methods of treatment have been discussed with the patient and family. After consideration of risks, benefits and other options for treatment, the patient has consented to  Procedure(s): Lower Extremity Angiography (Left) as a surgical intervention.  The patient's history has been reviewed, patient examined, no change in status, stable for surgery.  I have reviewed the patient's chart and labs.  Questions were answered to the patient's satisfaction.     [name]

## 2022-10-31 NOTE — Progress Notes (Signed)
Recovery complete. Patient did not want to wait to see MD post procedure. Discharged per orders. MD notified.

## 2022-10-31 NOTE — Progress Notes (Signed)
Pt blood glucose 55. Alert and oriented. Patient able to tolerate PO intake. Repeat Blood Glucose WNL.

## 2022-11-01 ENCOUNTER — Encounter: Payer: Self-pay | Admitting: Vascular Surgery

## 2022-11-07 ENCOUNTER — Telehealth (INDEPENDENT_AMBULATORY_CARE_PROVIDER_SITE_OTHER): Payer: Self-pay

## 2022-11-07 NOTE — Telephone Encounter (Signed)
Pt made aware and states understanding. 

## 2022-11-07 NOTE — Telephone Encounter (Signed)
Tried to call pt no answer will try again later.  

## 2022-11-07 NOTE — Telephone Encounter (Signed)
She has neuropathy, so the procedure wouldn't necessarily make that go away. So, yes in her case it is likely normal.

## 2022-11-13 DIAGNOSIS — E119 Type 2 diabetes mellitus without complications: Secondary | ICD-10-CM | POA: Diagnosis not present

## 2022-11-13 DIAGNOSIS — I10 Essential (primary) hypertension: Secondary | ICD-10-CM | POA: Diagnosis not present

## 2022-11-14 ENCOUNTER — Other Ambulatory Visit (INDEPENDENT_AMBULATORY_CARE_PROVIDER_SITE_OTHER): Payer: Self-pay | Admitting: Vascular Surgery

## 2022-11-14 DIAGNOSIS — I739 Peripheral vascular disease, unspecified: Secondary | ICD-10-CM

## 2022-11-17 ENCOUNTER — Ambulatory Visit (INDEPENDENT_AMBULATORY_CARE_PROVIDER_SITE_OTHER): Payer: Medicare HMO | Admitting: Nurse Practitioner

## 2022-11-17 ENCOUNTER — Ambulatory Visit (INDEPENDENT_AMBULATORY_CARE_PROVIDER_SITE_OTHER): Payer: Medicare HMO

## 2022-11-17 ENCOUNTER — Encounter (INDEPENDENT_AMBULATORY_CARE_PROVIDER_SITE_OTHER): Payer: Self-pay | Admitting: Nurse Practitioner

## 2022-11-17 VITALS — BP 137/74 | HR 66 | Resp 18 | Ht 61.0 in | Wt 197.6 lb

## 2022-11-17 DIAGNOSIS — Z9889 Other specified postprocedural states: Secondary | ICD-10-CM

## 2022-11-17 DIAGNOSIS — I10 Essential (primary) hypertension: Secondary | ICD-10-CM | POA: Diagnosis not present

## 2022-11-17 DIAGNOSIS — E0842 Diabetes mellitus due to underlying condition with diabetic polyneuropathy: Secondary | ICD-10-CM

## 2022-11-17 DIAGNOSIS — I739 Peripheral vascular disease, unspecified: Secondary | ICD-10-CM | POA: Diagnosis not present

## 2022-11-17 NOTE — Progress Notes (Signed)
Subjective:    Patient ID: Cynthia Sims, female    DOB: [birth year], 72 y.o.   MRN: [id number] Chief Complaint  Patient presents with   Follow-up    3 week follow up with BIL ABI    The patient returns to the office for followup and review status post angiogram with intervention on 10/31/2022.   Procedure: Procedure(s) Performed:             1.  Introduction catheter into left lower extremity 3rd order catheter placement              2.    Contrast injection left lower extremity for distal runoff             3.  Percutaneous transluminal angioplasty and stent placement left superficial femoral artery and popliteal             4.  Star close closure right common femoral arteriotomy   The patient notes improvement in the lower extremity symptoms. No interval shortening of the patient's claudication distance or rest pain symptoms. No new ulcers or wounds have occurred since the last visit.  He still continues to have issues with neuropathy.  There have been no significant changes to the patient's overall health care.  No documented history of amaurosis fugax or recent TIA symptoms. There are no recent neurological changes noted. No documented history of DVT, PE or superficial thrombophlebitis. The patient denies recent episodes of angina or shortness of breath.   ABI's Rt=1.20 and Lt=1.18  (previous ABI's Rt=0.71 and Lt=0.76) Duplex US of the bilateral lower extremities the patient has good triphasic tibial artery waveforms bilaterally.    Review of Systems  Cardiovascular:  Negative for leg swelling.  All other systems reviewed and are negative.      Objective:   Physical Exam Vitals reviewed.  HENT:     Head: Normocephalic.  Cardiovascular:     Rate and Rhythm: Normal rate.     Pulses:          Dorsalis pedis pulses are 1+ on the right side and 1+ on the left side.       Posterior tibial pulses are detected w/ Doppler on the right side and detected w/ Doppler on the  left side.  Pulmonary:     Effort: Pulmonary effort is normal.  Skin:    General: Skin is warm and dry.  Neurological:     Mental Status: She is alert and oriented to person, place, and time.  Psychiatric:        Mood and Affect: Mood normal.        Behavior: Behavior normal.        Thought Content: Thought content normal.        Judgment: Judgment normal.     BP 137/74 (BP Location: Left Arm)   Pulse 66   Resp 18   Ht 5\' 1"  (1.549 m)   Wt 197 lb 9.6 oz (89.6 kg)   BMI 37.34 kg/m   Past Medical History:  Diagnosis Date   ALT (SGPT) level raised    Anemia    Anxiety    Arthritis    Depression    Diabetes mellitus without complication (HCC)    Elevated cholesterol    GERD (gastroesophageal reflux disease)    Hemorrhoids    Hypertension    Kidney disease     Social History   Socioeconomic History   Marital status: Married    Spouse name: Not  on file   Number of children: 5   Years of education: Not on file   Highest education level: 8th grade  Occupational History   Occupation: retired  Tobacco Use   Smoking status: Never    Passive exposure: Never   Smokeless tobacco: Never  Vaping Use   Vaping Use: Never used  Substance and Sexual Activity   Alcohol use: No    Alcohol/week: 0.0 standard drinks of alcohol   Drug use: No   Sexual activity: Yes    Birth control/protection: Post-menopausal  Other Topics Concern   Not on file  Social History Narrative   Applied for food stamps. Pt is living off social security money and is raising one of her grandchildren.    Social Determinants of Health   Financial Resource Strain: Low Risk  (10/04/2022)   Overall Financial Resource Strain (CARDIA)    Difficulty of Paying Living Expenses: Not hard at all  Food Insecurity: No Food Insecurity (10/04/2022)   Hunger Vital Sign    Worried About Running Out of Food in the Last Year: Never true    Ran Out of Food in the Last Year: Never true  Transportation Needs: No  Transportation Needs (10/04/2022)   PRAPARE - Administrator, Civil Service (Medical): No    Lack of Transportation (Non-Medical): No  Physical Activity: Inactive (10/04/2022)   Exercise Vital Sign    Days of Exercise per Week: 0 days    Minutes of Exercise per Session: 0 min  Stress: No Stress Concern Present (10/04/2022)   [employer] of Occupational Health - Occupational Stress Questionnaire    Feeling of Stress : Not at all  Social Connections: Moderately Integrated (10/04/2022)   Social Connection and Isolation Panel [NHANES]    Frequency of Communication with Friends and Family: More than three times a week    Frequency of Social Gatherings with Friends and Family: Once a week    Attends Religious Services: 1 to 4 times per year    Active Member of [employer] or Organizations: No    Attends Banker Meetings: Never    Marital Status: Married  Catering manager Violence: Not At Risk (10/04/2022)   Humiliation, Afraid, Rape, and Kick questionnaire    Fear of Current or Ex-Partner: No    Emotionally Abused: No    Physically Abused: No    Sexually Abused: No    Past Surgical History:  Procedure Laterality Date   abdominal blockage     CESAREAN SECTION  1983   CESAREAN SECTION     CHOLECYSTECTOMY     COLONOSCOPY WITH PROPOFOL N/A 10/29/2015   Procedure: COLONOSCOPY WITH PROPOFOL;  Surgeon: Scot Jun, MD;  Location: [facility] ENDOSCOPY;  Service: Endoscopy;  Laterality: N/A;   COLONOSCOPY WITH PROPOFOL N/A 11/07/2017   Procedure: COLONOSCOPY WITH PROPOFOL;  Surgeon: Toledo, Boykin Nearing, MD;  Location: [facility] ENDOSCOPY;  Service: Gastroenterology;  Laterality: N/A;   DILATION AND CURETTAGE OF UTERUS  2001   ESOPHAGOGASTRODUODENOSCOPY (EGD) WITH PROPOFOL N/A 10/29/2015   Procedure: ESOPHAGOGASTRODUODENOSCOPY (EGD) WITH PROPOFOL;  Surgeon: Scot Jun, MD;  Location: [facility] ENDOSCOPY;  Service: Endoscopy;  Laterality: N/A;   LOWER EXTREMITY ANGIOGRAPHY Right  10/24/2022   Procedure: Lower Extremity Angiography;  Surgeon: Renford Dills, MD;  Location: [facility] INVASIVE CV LAB;  Service: Cardiovascular;  Laterality: Right;   LOWER EXTREMITY ANGIOGRAPHY Left 10/31/2022   Procedure: Lower Extremity Angiography;  Surgeon: Renford Dills, MD;  Location: [facility] INVASIVE CV LAB;  Service: Cardiovascular;  Laterality: Left;    Family History  Problem Relation Age of Onset   Alzheimer's disease Mother    COPD Mother    Breast cancer Mother 66   Osteoporosis Mother    Heart attack Father    Lupus Father    Diabetes Father    Hypertension Father    Diabetes Sister    COPD Maternal Grandmother    Diabetes Paternal Grandfather    Diabetes Sister    Diabetes Sister     No Known Allergies     Latest Ref Rng & Units 06/09/2022    3:32 PM 09/22/2021   11:21 AM 10/18/2020   11:38 AM  CBC  WBC 3.4 - 10.8 x10E3/uL 5.4  6.1  6.1   Hemoglobin 11.1 - 15.9 g/dL 16.1  09.6  04.5   Hematocrit 34.0 - 46.6 % 33.4  35.6  33.1   Platelets 150 - 450 x10E3/uL 146  164  159       CMP     Component Value Date/Time   NA 135 07/20/2022 1126   NA 140 06/09/2022 1532   NA 135 (L) 03/01/2012 2236   K 4.5 07/20/2022 1126   K 4.4 03/01/2012 2236   CL 104 07/20/2022 1126   CL 103 03/01/2012 2236   CO2 22 07/20/2022 1126   CO2 18 (L) 03/01/2012 2236   GLUCOSE 82 07/20/2022 1126   GLUCOSE 494 (H) 03/01/2012 2236   BUN 17 10/31/2022 0748   BUN 24 06/09/2022 1532   BUN 23 (H) 03/01/2012 2236   CREATININE 1.62 (H) 10/31/2022 0748   CREATININE 1.72 (H) 03/01/2012 2236   CALCIUM 8.3 (L) 07/20/2022 1126   CALCIUM 8.3 (L) 03/01/2012 2236   PROT 6.8 06/09/2022 1532   PROT 6.7 03/01/2012 2236   ALBUMIN 4.2 06/09/2022 1532   ALBUMIN 3.2 (L) 03/01/2012 2236   AST 12 06/09/2022 1532   AST 47 (H) 03/01/2012 2236   ALT 14 06/09/2022 1532   ALT 56 03/01/2012 2236   ALKPHOS 76 06/09/2022 1532   ALKPHOS 75 03/01/2012 2236   BILITOT <0.2 06/09/2022 1532   BILITOT  0.5 03/01/2012 2236   EGFR 37 (L) 06/09/2022 1532   GFRNONAA 33 (L) 10/31/2022 0748   GFRNONAA 31 (L) 03/01/2012 2236     VAS [place] ABI WITH/WO TBI  Result Date: 10/09/2022  LOWER EXTREMITY DOPPLER STUDY Patient Name:  PRANISHA CHANNEL  Date of Exam:   10/05/2022 Medical Rec #: [id number]        Accession #:    [id number] Date of Birth: Jun 22, 1950        Patient Gender: F Patient Age:   4 years Exam Location:  [facility] Procedure:      VAS [place] ABI WITH/WO TBI Referring Phys: --------------------------------------------------------------------------------  Indications: Peripheral artery disease. Other Factors: Home screen r = .58 l = .48.  Performing Technologist: Salvadore Farber RVT  Examination Guidelines: A complete evaluation includes at minimum, Doppler waveform signals and systolic blood pressure reading at the level of bilateral brachial, anterior tibial, and posterior tibial arteries, when vessel segments are accessible. Bilateral testing is considered an integral part of a complete examination. Photoelectric Plethysmograph (PPG) waveforms and toe systolic pressure readings are included as required and additional duplex testing as needed. Limited examinations for reoccurring indications may be performed as noted.  ABI Findings: +---------+------------------+-----+----------+--------+ Right    Rt Pressure (mmHg)IndexWaveform  Comment  +---------+------------------+-----+----------+--------+ Brachial 192                                       +---------+------------------+-----+----------+--------+  ATA      137               0.71 monophasic         +---------+------------------+-----+----------+--------+ PTA      134               0.70 monophasic         +---------+------------------+-----+----------+--------+ Great Toe116               0.60 Abnormal           +---------+------------------+-----+----------+--------+  +---------+------------------+-----+----------+-------+ Left     Lt Pressure (mmHg)IndexWaveform  Comment +---------+------------------+-----+----------+-------+ Brachial 190                                      +---------+------------------+-----+----------+-------+ ATA      126               0.66 monophasic        +---------+------------------+-----+----------+-------+ PTA      146               0.76 monophasic        +---------+------------------+-----+----------+-------+ Great Toe97                0.51 Abnormal          +---------+------------------+-----+----------+-------+  Summary: Right: Resting right ankle-brachial index indicates moderate right lower extremity arterial disease. The right toe-brachial index is abnormal. Left: Resting left ankle-brachial index indicates moderate left lower extremity arterial disease. The left toe-brachial index is abnormal. *See table(s) above for measurements and observations.  Electronically signed by [name] MD on 10/09/2022 at 8:49:49 AM.    Final        Assessment & Plan:   1. Peripheral arterial disease with history of revascularization (HCC) Recommend:  The patient is status post successful angiogram with intervention.  The patient reports that the claudication symptoms and leg pain has improved.   The patient denies lifestyle limiting changes at this point in time.  No further invasive studies, angiography or surgery at this time The patient should continue walking and begin a more formal exercise program.  The patient should continue antiplatelet therapy and aggressive treatment of the lipid abnormalities  Continued surveillance is indicated as atherosclerosis is likely to progress with time.    Patient should undergo noninvasive studies as ordered. The patient will follow up with me to review the studies.   2. Essential hypertension Continue antihypertensive medications as already ordered, these medications  have been reviewed and there are no changes at this time.  3. Diabetes mellitus due to underlying condition with diabetic polyneuropathy, without long-term current use of insulin (HCC) Continue hypoglycemic medications as already ordered, these medications have been reviewed and there are no changes at this time.  Hgb A1C to be monitored as already arranged by primary service   Current Outpatient Medications on File Prior to Visit  Medication Sig Dispense Refill   acetaminophen (TYLENOL) 500 MG tablet Take 500 mg by mouth every 6 (six) hours as needed.      aspirin EC 81 MG tablet Take 81 mg by mouth daily.      carvedilol (COREG) 25 MG tablet Take 1 tablet (25 mg total) by mouth 2 (two) times daily. 60 tablet 2   cholestyramine (QUESTRAN) 4 g packet DISSOLVE AND TAKE ONE PACKET BY MOUTH THREE TIMES DAILY 270 each 1   clopidogrel (PLAVIX) 75  MG tablet Take 1 tablet (75 mg total) by mouth daily. 30 tablet 5   cyanocobalamin 1000 MCG tablet Take 1,000 mcg by mouth daily.     DULoxetine (CYMBALTA) 60 MG capsule Take 1 capsule by mouth once daily 90 capsule 1   gabapentin (NEURONTIN) 100 MG capsule Take 1 capsule (100 mg total) by mouth 3 (three) times daily. 90 capsule 2   glipiZIDE (GLUCOTROL) 10 MG tablet Take 1 tablet (10 mg total) by mouth 2 (two) times daily. 180 tablet 3   glucose blood (ONETOUCH ULTRA) test strip 1 each by Other route 3 (three) times daily. Use as instructed 100 each 2   Insulin Glargine (BASAGLAR KWIKPEN) 100 UNIT/ML INJECT 24 UNITS SUBCUTANEOUSLY ONCE DAILY 15 mL 2   irbesartan (AVAPRO) 300 MG tablet Take 1 tablet (300 mg total) by mouth daily. 90 tablet 3   isosorbide mononitrate (IMDUR) 30 MG 24 hr tablet Take 1 tablet (30 mg total) by mouth daily. 90 tablet 3   Lancets (ONETOUCH DELICA PLUS LANCET33G) MISC USE 1  TO CHECK GLUCOSE ONCE DAILY 100 each 3   lovastatin (MEVACOR) 40 MG tablet Take 1 tablet by mouth once daily 90 tablet 1   metFORMIN (GLUCOPHAGE) 500 MG  tablet Take 1 tablet (500 mg total) by mouth 2 (two) times daily with a meal. 180 tablet 3   nitroGLYCERIN (NITROSTAT) 0.4 MG SL tablet Place 1 tablet (0.4 mg total) under the tongue every 5 (five) minutes as needed for chest pain. 25 tablet 3   pantoprazole (PROTONIX) 40 MG tablet Take 1 tablet (40 mg total) by mouth daily. 30 tablet 11   cyanocobalamin (,VITAMIN B-12,) 1000 MCG/ML injection Inject into the muscle daily. (Patient not taking: Reported on 11/17/2022)     No current facility-administered medications on file prior to visit.    There are no Patient Instructions on file for this visit. No follow-ups on file.   Georgiana Spinner, NP

## 2022-11-18 ENCOUNTER — Encounter (INDEPENDENT_AMBULATORY_CARE_PROVIDER_SITE_OTHER): Payer: Self-pay | Admitting: Nurse Practitioner

## 2022-11-23 LAB — VAS US ABI WITH/WO TBI
Left ABI: 1.18
Right ABI: 1.2

## 2022-12-10 DIAGNOSIS — E119 Type 2 diabetes mellitus without complications: Secondary | ICD-10-CM | POA: Diagnosis not present

## 2022-12-10 DIAGNOSIS — I10 Essential (primary) hypertension: Secondary | ICD-10-CM | POA: Diagnosis not present

## 2022-12-11 ENCOUNTER — Other Ambulatory Visit: Payer: Self-pay | Admitting: Family Medicine

## 2022-12-11 DIAGNOSIS — E1149 Type 2 diabetes mellitus with other diabetic neurological complication: Secondary | ICD-10-CM

## 2022-12-25 ENCOUNTER — Encounter: Payer: Self-pay | Admitting: Family Medicine

## 2022-12-25 ENCOUNTER — Ambulatory Visit (INDEPENDENT_AMBULATORY_CARE_PROVIDER_SITE_OTHER): Payer: Medicare HMO | Admitting: Family Medicine

## 2022-12-25 VITALS — BP 143/76 | HR 63 | Ht 61.0 in | Wt 198.4 lb

## 2022-12-25 DIAGNOSIS — K469 Unspecified abdominal hernia without obstruction or gangrene: Secondary | ICD-10-CM | POA: Diagnosis not present

## 2022-12-25 DIAGNOSIS — E782 Mixed hyperlipidemia: Secondary | ICD-10-CM

## 2022-12-25 DIAGNOSIS — E1140 Type 2 diabetes mellitus with diabetic neuropathy, unspecified: Secondary | ICD-10-CM | POA: Diagnosis not present

## 2022-12-25 DIAGNOSIS — N183 Chronic kidney disease, stage 3 unspecified: Secondary | ICD-10-CM

## 2022-12-25 DIAGNOSIS — I10 Essential (primary) hypertension: Secondary | ICD-10-CM

## 2022-12-25 DIAGNOSIS — E0842 Diabetes mellitus due to underlying condition with diabetic polyneuropathy: Secondary | ICD-10-CM

## 2022-12-25 DIAGNOSIS — I739 Peripheral vascular disease, unspecified: Secondary | ICD-10-CM

## 2022-12-25 DIAGNOSIS — G4733 Obstructive sleep apnea (adult) (pediatric): Secondary | ICD-10-CM

## 2022-12-25 DIAGNOSIS — E785 Hyperlipidemia, unspecified: Secondary | ICD-10-CM | POA: Diagnosis not present

## 2022-12-25 DIAGNOSIS — Z794 Long term (current) use of insulin: Secondary | ICD-10-CM | POA: Diagnosis not present

## 2022-12-25 DIAGNOSIS — F419 Anxiety disorder, unspecified: Secondary | ICD-10-CM

## 2022-12-25 DIAGNOSIS — E1169 Type 2 diabetes mellitus with other specified complication: Secondary | ICD-10-CM | POA: Diagnosis not present

## 2022-12-25 NOTE — Assessment & Plan Note (Signed)
Recent home readings have been high, up to 200 systolic. Currently on carvedilol 25mg  twice daily and irbesartan 300mg  daily. -Chronic, BP improved from 150+systolic -Check blood pressure 1-2 hours after taking medication and keep a record. -Consider medication adjustment if blood pressure remains high after reviewing ambulatory measurements in four months

## 2022-12-25 NOTE — Patient Instructions (Signed)
How to Take Your Blood Pressure Blood pressure measures how strongly your blood is pressing against the walls of your arteries. Arteries are blood vessels that carry blood from your heart throughout your body. You can take your blood pressure at home with a machine. You may need to check your blood pressure at home: To check if you have high blood pressure (hypertension). To check your blood pressure over time. To make sure your blood pressure medicine is working. Supplies needed: Blood pressure machine, or monitor. A chair to sit in. This should be a chair where you can sit upright with your back supported. Do not sit on a soft couch or an armchair. Table or desk. Small notebook. Pencil or pen. How to prepare Avoid these things for 30 minutes before checking your blood pressure: Having drinks with caffeine in them, such as coffee or tea. Drinking alcohol. Eating. Smoking. Exercising. Do these things five minutes before checking your blood pressure: Go to the bathroom and pee (urinate). Sit in a chair. Be quiet. Do not talk. How to take your blood pressure Follow the instructions that came with your machine. If you have a digital blood pressure monitor, these may be the instructions: Sit up straight. Place your feet on the floor. Do not cross your ankles or legs. Rest your left arm at the level of your heart. You may rest it on a table, desk, or chair. Pull up your shirt sleeve. Wrap the blood pressure cuff around the upper part of your left arm. The cuff should be 1 inch (2.5 cm) above your elbow. It is best to wrap the cuff around bare skin. Fit the cuff snugly around your arm, but not too tightly. You should be able to place only one finger between the cuff and your arm. Place the cord so that it rests in the bend of your elbow. Press the power button. Sit quietly while the cuff fills with air and loses air. Write down the numbers on the screen. Wait 2-3 minutes and then repeat  steps 1-10. What do the numbers mean? Two numbers make up your blood pressure. The first number is called systolic pressure. The second is called diastolic pressure. An example of a blood pressure reading is "120 over 80" (or 120/80). If you are an adult and do not have a medical condition, use this guide to find out if your blood pressure is normal: Normal First number: below 120. Second number: below 80. Elevated First number: 120-129. Second number: below 80. Hypertension stage 1 First number: 130-139. Second number: 80-89. Hypertension stage 2 First number: 140 or above. Second number: 90 or above. Your blood pressure is above normal even if only the first or only the second number is above normal. Follow these instructions at home: Medicines Take over-the-counter and prescription medicines only as told by your doctor. Tell your doctor if your medicine is causing side effects. General instructions Check your blood pressure as often as your doctor tells you to. Check your blood pressure at the same time every day. Take your monitor to your next doctor's appointment. Your doctor will: Make sure you are using it correctly. Make sure it is working right. Understand what your blood pressure numbers should be. Keep all follow-up visits. General tips You will need a blood pressure machine or monitor. Your doctor can suggest a monitor. You can buy one at a drugstore or online. When choosing one: Choose one with an arm cuff. Choose one that wraps around your   upper arm. Only one finger should fit between your arm and the cuff. Do not choose one that measures your blood pressure from your wrist or finger. Where to find more information American Heart Association: www.heart.org Contact a doctor if: Your blood pressure keeps being high. Your blood pressure is suddenly low. Get help right away if: Your first blood pressure number is higher than 180. Your second blood pressure number is  higher than 120. These symptoms may be an emergency. Do not wait to see if the symptoms will go away. Get help right away. Call 911. Summary Check your blood pressure at the same time every day. Avoid caffeine, alcohol, smoking, and exercise for 30 minutes before checking your blood pressure. Make sure you understand what your blood pressure numbers should be. This information is not intended to replace advice given to you by your health care provider. Make sure you discuss any questions you have with your health care provider. Document Revised: 02/10/2021 Document Reviewed: 02/10/2021 Elsevier Patient Education  2024 Elsevier Inc.  

## 2022-12-25 NOTE — Assessment & Plan Note (Signed)
Chronic problem, Stable on Cymbalta 60mg  daily. -Continue current management.

## 2022-12-25 NOTE — Assessment & Plan Note (Signed)
Recent elevated blood glucose levels, possibly due to dietary indiscretions. A1c was 6.3 in September, and home glucose readings have been between 90s and 110s. Currently on metformin 500mg  twice daily, glipizide 10mg  twice daily, and Basaglar insulin 24 units once daily. -Chronic -stable, A1c at goal in 2023  -Check A1c today. -Consider discontinuing metformin due to borderline renal function (GFR 33). -Continue current regimen and monitor blood glucose levels at home.

## 2022-12-25 NOTE — Assessment & Plan Note (Signed)
Recent angioplasty in June, on Imdur 30mg  for management. -Continue current management and follow-up with vascular surgery as needed.

## 2022-12-25 NOTE — Progress Notes (Signed)
Established patient visit   [name]   DOB: 1951/01/22   72 y.o. Female  MRN: [id number] Visit Date: 12/25/2022  Today's healthcare provider: [name], MD   Chief Complaint  Patient presents with   Medical Management of Chronic Issues    Patient reports checking her blood sugar at home 184 this morning but on average fasting 90s-110. She reports she has not been using her cpap but her husband has.  She checks her blood pressure on occasions. She reports anxiety is the same since last visit and is moderate. Patient reports taking all medications as prescribed.    Subjective     HPI     Medical Management of Chronic Issues    Additional comments: Patient reports checking her blood sugar at home 184 this morning but on average fasting 90s-110. She reports she has not been using her cpap but her husband has.  She checks her blood pressure on occasions. She reports anxiety is the same since last visit and is moderate. Patient reports taking all medications as prescribed.         Comments   Patient see's gastro next month and will schedule colonoscopy at that time      Last edited by Acey Lav, CMA on 12/25/2022  8:26 AM.      Diabetes Regimen includes glipizide 10 mg twice daily, glargine 24 units once daily, metformin 500 mg twice daily Lab Results  Component Value Date   HGBA1C 6.3 (A) 02/20/2022   Discussed the use of AI scribe software for clinical note transcription with the patient, who gave verbal consent to proceed.  History of Present Illness   The patient, with a history of diabetes, peripheral arterial disease, hypertension, and chronic kidney disease, presents with concerns about her diabetes management and neuropathic symptoms. She reports a morning blood glucose level of 184, which she attributes to consuming a trail mix the previous night. She acknowledges this was a poor dietary choice given her morning appointment. The  patient's blood glucose levels typically range between 90 and 110.  The patient has been trying to stay active within her home, but her mobility is limited due to neuropathic symptoms in her legs. She describes the pain as a combination of numbness, tingling, and a sensation of pins in her legs, particularly in her toes. Despite having stents placed in her legs, the neuropathic symptoms persist. She is currently taking gabapentin 300 mg three times a day and Cymbalta 60 mg, both of which seem to provide minimal relief.  The patient also reports a history of sleep apnea but is not currently using a machine. She denies experiencing excessive daytime sleepiness or frequent napping. She has been diagnosed with chronic kidney disease, with a creatinine level of 1.62 in May. She expresses concern about the potential need for dialysis in the future.  Regarding her hypertension, the patient reports that her blood pressure readings at home often run high, sometimes reaching up to 200. She is currently on carvedilol 25 mg twice a day and irbesartan 300 mg once a day. She denies experiencing headaches or vision changes when her blood pressure is elevated.  The patient also mentions a hernia that occasionally causes discomfort. She believes the hernia developed from lifting heavy boxes during her previous employment. The location of the hernia and the nature of the pain seem to vary. She expresses interest in a potential surgical consultation for the hernia.  Medications: Outpatient Medications Prior to Visit  Medication Sig   acetaminophen (TYLENOL) 500 MG tablet Take 500 mg by mouth every 6 (six) hours as needed.    aspirin EC 81 MG tablet Take 81 mg by mouth daily.    carvedilol (COREG) 25 MG tablet Take 1 tablet (25 mg total) by mouth 2 (two) times daily.   cholestyramine (QUESTRAN) 4 g packet DISSOLVE AND TAKE ONE PACKET BY MOUTH THREE TIMES DAILY   clopidogrel (PLAVIX) 75 MG tablet Take 1 tablet  (75 mg total) by mouth daily.   cyanocobalamin (,VITAMIN B-12,) 1000 MCG/ML injection Inject into the muscle daily.   cyanocobalamin 1000 MCG tablet Take 1,000 mcg by mouth daily.   DULoxetine (CYMBALTA) 60 MG capsule Take 1 capsule by mouth once daily   gabapentin (NEURONTIN) 100 MG capsule TAKE 1 CAPSULE BY MOUTH THREE TIMES DAILY   glipiZIDE (GLUCOTROL) 10 MG tablet Take 1 tablet (10 mg total) by mouth 2 (two) times daily.   glucose blood (ONETOUCH ULTRA) test strip 1 each by Other route 3 (three) times daily. Use as instructed   Insulin Glargine (BASAGLAR KWIKPEN) 100 UNIT/ML INJECT 24 UNITS SUBCUTANEOUSLY ONCE DAILY   irbesartan (AVAPRO) 300 MG tablet Take 1 tablet (300 mg total) by mouth daily.   isosorbide mononitrate (IMDUR) 30 MG 24 hr tablet Take 1 tablet (30 mg total) by mouth daily.   Lancets (ONETOUCH DELICA PLUS LANCET33G) MISC USE 1  TO CHECK GLUCOSE ONCE DAILY   lovastatin (MEVACOR) 40 MG tablet Take 1 tablet by mouth once daily   metFORMIN (GLUCOPHAGE) 500 MG tablet Take 1 tablet (500 mg total) by mouth 2 (two) times daily with a meal.   nitroGLYCERIN (NITROSTAT) 0.4 MG SL tablet Place 1 tablet (0.4 mg total) under the tongue every 5 (five) minutes as needed for chest pain.   pantoprazole (PROTONIX) 40 MG tablet Take 1 tablet (40 mg total) by mouth daily.   No facility-administered medications prior to visit.    Review of Systems       Objective    BP (!) 143/76 (BP Location: Left Arm, Patient Position: Sitting, Cuff Size: Normal)   Pulse 63   Ht 5\' 1"  (1.549 m)   Wt 198 lb 6.4 oz (90 kg)   BMI 37.49 kg/m      Physical Exam  Physical Exam   VITALS: BP- 143/76 CHEST: Lungs clear to auscultation, no wheezing or crackles. CARDIOVASCULAR: Heart rhythm regular, rate normal. EXTREMITIES: Mild pedal edema.       No results found for any visits on 12/25/22.  Assessment & Plan     Problem List Items Addressed This Visit     Anxiety    Chronic problem,  Stable on Cymbalta 60mg  daily. -Continue current management.      Apnea, sleep    No current symptoms, no use of CPAP machine. Chronic  Stable with CPAP use  Patient advised to continue CPAP nightly  The patient has had significant benefit from the use of CPAP machine with considerable improvements in quality of life, work production and decrease medical complaints.  The patient will need continued maintenance and care CPAP supplies (including upgrades as deemed appropriate) in order to continue treatment for sleep apnea as this is medically necessary.         Chronic kidney disease (CKD), stage III (moderate) (HCC) - Primary     Last creatinine was 1.62 in May, GFR 33. -Chronic  -stable  -does not follow with nephrology at  this time  -Check BMP today to assess current renal function. -continue irbesartan 300mg  daily       Relevant Orders   Basic Metabolic Panel (BMET)   Diabetes (HCC)    Recent elevated blood glucose levels, possibly due to dietary indiscretions. A1c was 6.3 in September, and home glucose readings have been between 90s and 110s. Currently on metformin 500mg  twice daily, glipizide 10mg  twice daily, and Basaglar insulin 24 units once daily. -Chronic -stable, A1c at goal in 2023  -Check A1c today. -Consider discontinuing metformin due to borderline renal function (GFR 33). -Continue current regimen and monitor blood glucose levels at home.      Relevant Orders   CBC   Hemoglobin A1c   Essential hypertension    Recent home readings have been high, up to 200 systolic. Currently on carvedilol 25mg  twice daily and irbesartan 300mg  daily. -Chronic, BP improved from 150+systolic -Check blood pressure 1-2 hours after taking medication and keep a record. -Consider medication adjustment if blood pressure remains high after reviewing ambulatory measurements in four months       Hernia of abdominal cavity    Chronic  Progressively more bothersome  Submitted referral  for general surgery evaluation for pain related to hernia (per patient)       Relevant Orders   Ambulatory referral to General Surgery   Hyperlipidemia associated with type 2 diabetes mellitus (HCC)    On lovastatin 40mg  for cholesterol control. -Chronic -Continue current management on lovastatin 40mg  daily       Relevant Orders   Lipid Profile   RESOLVED: Hyperlipidemia associated with type 2 diabetes mellitus (HCC)   Relevant Orders   Lipid Profile   PAD (peripheral artery disease) (HCC)    Recent angioplasty in June, on Imdur 30mg  for management. -Continue current management and follow-up with vascular surgery as needed.        Return in about 4 months (around 04/27/2023) for CHRONIC F/U.         [name], MD  [facility] [phone] (phone) [phone] (fax)  [facility]

## 2022-12-25 NOTE — Assessment & Plan Note (Signed)
No current symptoms, no use of CPAP machine. Chronic  Stable with CPAP use  Patient advised to continue CPAP nightly  The patient has had significant benefit from the use of CPAP machine with considerable improvements in quality of life, work production and decrease medical complaints.  The patient will need continued maintenance and care CPAP supplies (including upgrades as deemed appropriate) in order to continue treatment for sleep apnea as this is medically necessary.

## 2022-12-25 NOTE — Assessment & Plan Note (Signed)
On lovastatin 40mg  for cholesterol control. -Chronic -Continue current management on lovastatin 40mg  daily

## 2022-12-25 NOTE — Assessment & Plan Note (Signed)
Last creatinine was 1.62 in May, GFR 33. -Chronic  -stable  -does not follow with nephrology at this time  -Check BMP today to assess current renal function. -continue irbesartan 300mg  daily

## 2022-12-25 NOTE — Assessment & Plan Note (Signed)
Chronic  Progressively more bothersome  Submitted referral for general surgery evaluation for pain related to hernia (per patient)

## 2022-12-26 ENCOUNTER — Other Ambulatory Visit: Payer: Self-pay | Admitting: Family Medicine

## 2022-12-26 DIAGNOSIS — E875 Hyperkalemia: Secondary | ICD-10-CM

## 2022-12-26 LAB — HEMOGLOBIN A1C
Est. average glucose Bld gHb Est-mCnc: 166 mg/dL
Hgb A1c MFr Bld: 7.4 % — ABNORMAL HIGH (ref 4.8–5.6)

## 2022-12-26 LAB — CBC
Hematocrit: 30.9 % — ABNORMAL LOW (ref 34.0–46.6)
Hemoglobin: 9.8 g/dL — ABNORMAL LOW (ref 11.1–15.9)
MCH: 28.7 pg (ref 26.6–33.0)
MCHC: 31.7 g/dL (ref 31.5–35.7)
MCV: 90 fL (ref 79–97)
Platelets: 145 10*3/uL — ABNORMAL LOW (ref 150–450)
RBC: 3.42 x10E6/uL — ABNORMAL LOW (ref 3.77–5.28)
RDW: 13.8 % (ref 11.7–15.4)
WBC: 5.2 10*3/uL (ref 3.4–10.8)

## 2022-12-26 LAB — BASIC METABOLIC PANEL
BUN/Creatinine Ratio: 16 (ref 12–28)
BUN: 28 mg/dL — ABNORMAL HIGH (ref 8–27)
CO2: 16 mmol/L — ABNORMAL LOW (ref 20–29)
Calcium: 8.8 mg/dL (ref 8.7–10.3)
Chloride: 109 mmol/L — ABNORMAL HIGH (ref 96–106)
Creatinine, Ser: 1.74 mg/dL — ABNORMAL HIGH (ref 0.57–1.00)
Glucose: 146 mg/dL — ABNORMAL HIGH (ref 70–99)
Potassium: 5.6 mmol/L — ABNORMAL HIGH (ref 3.5–5.2)
Sodium: 139 mmol/L (ref 134–144)
eGFR: 30 mL/min/{1.73_m2} — ABNORMAL LOW (ref >59)

## 2022-12-26 LAB — LIPID PANEL
Chol/HDL Ratio: 2.3 ratio (ref 0.0–4.4)
Cholesterol, Total: 131 mg/dL (ref 100–199)
HDL: 58 mg/dL (ref >39)
LDL Chol Calc (NIH): 45 mg/dL (ref 0–99)
Triglycerides: 168 mg/dL — ABNORMAL HIGH (ref 0–149)
VLDL Cholesterol Cal: 28 mg/dL (ref 5–40)

## 2022-12-26 MED ORDER — LOKELMA 5 G PO PACK: 10.0000 g | PACK | Freq: Every day | ORAL | 0 refills | Status: AC

## 2022-12-28 ENCOUNTER — Other Ambulatory Visit: Payer: Self-pay

## 2022-12-28 ENCOUNTER — Ambulatory Visit: Payer: Medicare HMO | Admitting: Surgery

## 2022-12-28 ENCOUNTER — Other Ambulatory Visit: Payer: Self-pay | Admitting: Family Medicine

## 2022-12-28 DIAGNOSIS — D649 Anemia, unspecified: Secondary | ICD-10-CM

## 2022-12-28 DIAGNOSIS — I10 Essential (primary) hypertension: Secondary | ICD-10-CM

## 2022-12-29 ENCOUNTER — Other Ambulatory Visit: Payer: Self-pay | Admitting: Family Medicine

## 2022-12-29 DIAGNOSIS — I10 Essential (primary) hypertension: Secondary | ICD-10-CM

## 2022-12-29 NOTE — Telephone Encounter (Signed)
Requested Prescriptions  Refused Prescriptions Disp Refills   carvedilol (COREG) 12.5 MG tablet [Pharmacy Med Name: Carvedilol 12.5 MG Oral Tablet] 180 tablet 0    Sig: TAKE 1 TABLET BY MOUTH TWICE DAILY WITH A MEAL     Cardiovascular: Beta Blockers 3 Failed - 12/29/2022  6:40 AM      Failed - Cr in normal range and within 360 days    Creatinine  Date Value Ref Range Status  03/01/2012 1.72 (H) 0.60 - 1.30 mg/dL Final   Creatinine, Ser  Date Value Ref Range Status  12/25/2022 1.74 (H) 0.57 - 1.00 mg/dL Final   Creatinine, POC  Date Value Ref Range Status  08/02/2017 NA mg/dL Final         Failed - Last BP in normal range    BP Readings from Last 1 Encounters:  12/25/22 (!) 143/76         Passed - AST in normal range and within 360 days    AST  Date Value Ref Range Status  06/09/2022 12 0 - 40 IU/L Final   SGOT(AST)  Date Value Ref Range Status  03/01/2012 47 (H) 15 - 37 Unit/L Final         Passed - ALT in normal range and within 360 days    ALT  Date Value Ref Range Status  06/09/2022 14 0 - 32 IU/L Final   SGPT (ALT)  Date Value Ref Range Status  03/01/2012 56 12 - 78 U/L Final         Passed - Last Heart Rate in normal range    Pulse Readings from Last 1 Encounters:  12/25/22 63         Passed - Valid encounter within last 6 months    Recent Outpatient Visits           4 days ago Stage 3 chronic kidney disease, unspecified whether stage 3a or 3b CKD (HCC)   [facility] Simmons-Robinson, [place], MD   4 months ago Screening for colon cancer   [facility] Simmons-Robinson, [place], MD   6 months ago SOB (shortness of breath)   [facility] Simmons-Robinson, [place], MD   6 months ago Primary hypertension   [facility] Simmons-Robinson, [place], MD   7 months ago Primary hypertension    [facility] Simmons-Robinson,  [place], MD       Future Appointments             In 1 month Chandrasekhar, Rondel Jumbo, MD [facility], [facility]   In 3 months Simmons-Robinson, Financial controller, MD [facility], [place]

## 2023-01-05 ENCOUNTER — Other Ambulatory Visit: Payer: Self-pay | Admitting: Cardiovascular Disease

## 2023-01-08 NOTE — Progress Notes (Signed)
 Patient ID: Cynthia Sims, female   DOB: 1950-10-23, 72 y.o.   MRN: [id number]  Chief Complaint: Right lower quadrant abdominal pain, reported bulge, concern for hernia.  History of Present Illness Cynthia Sims is a 73 y.o. female with prior history of 2-3 abdominal surgeries.  Longstanding history of reported hernia in the right lower quadrant.  Reports increasing pain/bulge noted while sitting watching TV, etc.  No particular reproducible bulge she is aware of.  No reproducible activity or circumstance in which the pain recurs.  Reports she had been told she had a hernia years ago and feels this may have progressed at this point.  Last CT scan was out of the system 4 years ago.  Past Medical History Past Medical History:  Diagnosis Date   ALT (SGPT) level raised    Anemia    Anxiety    Arthritis    Depression    Diabetes mellitus without complication (HCC)    Elevated cholesterol    GERD (gastroesophageal reflux disease)    Hemorrhoids    Hypertension    Kidney disease       Past Surgical History:  Procedure Laterality Date   abdominal blockage     CESAREAN SECTION  1983   CESAREAN SECTION     CHOLECYSTECTOMY     COLONOSCOPY WITH PROPOFOL N/A 10/29/2015   Procedure: COLONOSCOPY WITH PROPOFOL;  Surgeon: Scot Jun, MD;  Location: [facility] ENDOSCOPY;  Service: Endoscopy;  Laterality: N/A;   COLONOSCOPY WITH PROPOFOL N/A 11/07/2017   Procedure: COLONOSCOPY WITH PROPOFOL;  Surgeon: Toledo, Boykin Nearing, MD;  Location: [facility] ENDOSCOPY;  Service: Gastroenterology;  Laterality: N/A;   DILATION AND CURETTAGE OF UTERUS  2001   ESOPHAGOGASTRODUODENOSCOPY (EGD) WITH PROPOFOL N/A 10/29/2015   Procedure: ESOPHAGOGASTRODUODENOSCOPY (EGD) WITH PROPOFOL;  Surgeon: Scot Jun, MD;  Location: [facility] ENDOSCOPY;  Service: Endoscopy;  Laterality: N/A;   LOWER EXTREMITY ANGIOGRAPHY Right 10/24/2022   Procedure: Lower Extremity Angiography;  Surgeon: Renford Dills, MD;  Location: [facility] INVASIVE  CV LAB;  Service: Cardiovascular;  Laterality: Right;   LOWER EXTREMITY ANGIOGRAPHY Left 10/31/2022   Procedure: Lower Extremity Angiography;  Surgeon: Renford Dills, MD;  Location: [facility] INVASIVE CV LAB;  Service: Cardiovascular;  Laterality: Left;    No Known Allergies  Current Outpatient Medications  Medication Sig Dispense Refill   acetaminophen (TYLENOL) 500 MG tablet Take 500 mg by mouth every 6 (six) hours as needed.      aspirin EC 81 MG tablet Take 81 mg by mouth daily.      carvedilol (COREG) 25 MG tablet Take 1 tablet by mouth twice daily 180 tablet 2   cholestyramine (QUESTRAN) 4 g packet DISSOLVE AND TAKE ONE PACKET BY MOUTH THREE TIMES DAILY 270 each 1   clopidogrel (PLAVIX) 75 MG tablet Take 1 tablet (75 mg total) by mouth daily. 30 tablet 5   cyanocobalamin (,VITAMIN B-12,) 1000 MCG/ML injection Inject into the muscle daily.     cyanocobalamin 1000 MCG tablet Take 1,000 mcg by mouth daily.     DULoxetine (CYMBALTA) 60 MG capsule Take 1 capsule by mouth once daily 90 capsule 1   gabapentin (NEURONTIN) 100 MG capsule TAKE 1 CAPSULE BY MOUTH THREE TIMES DAILY 90 capsule 0   glipiZIDE (GLUCOTROL) 10 MG tablet Take 1 tablet (10 mg total) by mouth 2 (two) times daily. 180 tablet 3   glucose blood (ONETOUCH ULTRA) test strip 1 each by Other route 3 (three) times daily. Use as instructed 100  each 2   Insulin Glargine (BASAGLAR KWIKPEN) 100 UNIT/ML INJECT 24 UNITS SUBCUTANEOUSLY ONCE DAILY 15 mL 2   irbesartan (AVAPRO) 300 MG tablet Take 1 tablet (300 mg total) by mouth daily. 90 tablet 3   isosorbide mononitrate (IMDUR) 30 MG 24 hr tablet Take 1 tablet (30 mg total) by mouth daily. 90 tablet 3   Lancets (ONETOUCH DELICA PLUS LANCET33G) MISC USE 1  TO CHECK GLUCOSE ONCE DAILY 100 each 3   lovastatin (MEVACOR) 40 MG tablet Take 1 tablet by mouth once daily 90 tablet 1   metFORMIN (GLUCOPHAGE) 500 MG tablet Take 1 tablet (500 mg total) by mouth 2 (two) times daily with a meal. 180  tablet 3   nitroGLYCERIN (NITROSTAT) 0.4 MG SL tablet Place 1 tablet (0.4 mg total) under the tongue every 5 (five) minutes as needed for chest pain. 25 tablet 3   pantoprazole (PROTONIX) 40 MG tablet Take 1 tablet (40 mg total) by mouth daily. 30 tablet 11   No current facility-administered medications for this visit.    Family History Family History  Problem Relation Age of Onset   Alzheimer's disease Mother    COPD Mother    Breast cancer Mother 31   Osteoporosis Mother    Heart attack Father    Lupus Father    Diabetes Father    Hypertension Father    Diabetes Sister    COPD Maternal Grandmother    Diabetes Paternal Grandfather    Diabetes Sister    Diabetes Sister       Social History Social History   Tobacco Use   Smoking status: Never    Passive exposure: Never   Smokeless tobacco: Never  Vaping Use   Vaping status: Never Used  Substance Use Topics   Alcohol use: No    Alcohol/week: 0.0 standard drinks of alcohol   Drug use: No        Review of Systems  Constitutional: Negative.   HENT: Negative.    Eyes: Negative.   Respiratory: Negative.    Cardiovascular:  Positive for leg swelling.  Gastrointestinal:  Positive for abdominal pain.  Genitourinary:  Positive for dysuria.  Skin: Negative.   Neurological: Negative.   Psychiatric/Behavioral: Negative.       Physical Exam Blood pressure (!) 158/77, pulse 76, temperature 98 F (36.7 C), height 5\' 1"  (1.549 m), weight 195 lb (88.5 kg), SpO2 95%. Last Weight  Most recent update: 01/09/2023 10:54 AM    Weight  88.5 kg (195 lb)             CONSTITUTIONAL: Well developed, and nourished, appropriately responsive and aware without distress.   EYES: Sclera non-icteric.   EARS, NOSE, MOUTH AND THROAT:  The oropharynx is clear. Oral mucosa is pink and moist.    Hearing is intact to voice.  NECK: Trachea is midline, and there is no jugular venous distension.  LYMPH NODES:  Lymph nodes in the neck are  not appreciated. RESPIRATORY:  Lungs are clear, and breath sounds are equal bilaterally. Normal respiratory effort without pathologic use of accessory muscles. CARDIOVASCULAR: Heart is regular in rate and rhythm.  Well perfused.  GI: The abdomen is  soft, nontender, and nondistended.  There is a well-healed midline scar.  There were no palpable masses.  I could not appreciate hepatosplenomegaly.  There is no appreciable bulge or change in the abdominal wall to the right lower quadrant or right inguinal area with Valsalva.  Caryl Lyn present as chaperone. MUSCULOSKELETAL:  Symmetrical muscle tone appreciated in all four extremities.    SKIN: Skin turgor is normal. No pathologic skin lesions appreciated.  NEUROLOGIC:  Motor and sensation appear grossly normal.  Cranial nerves are grossly without defect. PSYCH:  Alert and oriented to person, place and time. Affect is appropriate for situation.  Data Reviewed I have personally reviewed what is currently available of the patient's imaging, recent labs and medical records.   Labs:     Latest Ref Rng & Units 12/25/2022    9:25 AM 06/09/2022    3:32 PM 09/22/2021   11:21 AM  CBC  WBC 3.4 - 10.8 x10E3/uL 5.2  5.4  6.1   Hemoglobin 11.1 - 15.9 g/dL 9.8  16.1  09.6   Hematocrit 34.0 - 46.6 % 30.9  33.4  35.6   Platelets 150 - 450 x10E3/uL 145  146  164       Latest Ref Rng & Units 12/25/2022    9:25 AM 10/31/2022    7:48 AM 10/24/2022    7:24 AM  CMP  Glucose 70 - 99 mg/dL 045     BUN 8 - 27 mg/dL 28  17  25    Creatinine 0.57 - 1.00 mg/dL 4.09  8.11  9.14   Sodium 134 - 144 mmol/L 139     Potassium 3.5 - 5.2 mmol/L 5.6     Chloride 96 - 106 mmol/L 109     CO2 20 - 29 mmol/L 16     Calcium 8.7 - 10.3 mg/dL 8.8         Imaging: Radiological images reviewed:   Within last 24 hrs: No results found.  Assessment    Right lower quadrant abdominal wall pain. 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  I will anticipate calling her with the results and if necessary, arrange follow-up.  Face-to-face time spent with the patient and accompanying care providers(if present) was 30 minutes, with more than 50% of the time spent counseling, educating, and coordinating care of the patient.    These notes generated with voice recognition software. I apologize for typographical errors.  Campbell Lerner M.D., FACS 01/09/2023, 11:17 AM

## 2023-01-09 ENCOUNTER — Ambulatory Visit: Payer: Medicare HMO | Admitting: Surgery

## 2023-01-09 ENCOUNTER — Encounter: Payer: Self-pay | Admitting: Surgery

## 2023-01-09 VITALS — BP 158/77 | HR 76 | Temp 98.0°F | Ht 61.0 in | Wt 195.0 lb

## 2023-01-09 DIAGNOSIS — R1031 Right lower quadrant pain: Secondary | ICD-10-CM

## 2023-01-09 DIAGNOSIS — K439 Ventral hernia without obstruction or gangrene: Secondary | ICD-10-CM

## 2023-01-09 NOTE — Patient Instructions (Addendum)
We will get you scheduled for a CT scan to better assess this area and what may be going on.  You are scheduled for a CT scan at [facility] on 01/17/23. You will need to arrive there by 10:30 am. Their address is [address], [place], [place] [address].  We will call you with the results and any next steps.     Please call and ask to speak with a nurse if you develop questions or concerns.

## 2023-01-10 DIAGNOSIS — E119 Type 2 diabetes mellitus without complications: Secondary | ICD-10-CM | POA: Diagnosis not present

## 2023-01-10 DIAGNOSIS — I10 Essential (primary) hypertension: Secondary | ICD-10-CM | POA: Diagnosis not present

## 2023-01-12 DIAGNOSIS — I10 Essential (primary) hypertension: Secondary | ICD-10-CM | POA: Diagnosis not present

## 2023-01-12 DIAGNOSIS — E119 Type 2 diabetes mellitus without complications: Secondary | ICD-10-CM | POA: Diagnosis not present

## 2023-01-16 DIAGNOSIS — K219 Gastro-esophageal reflux disease without esophagitis: Secondary | ICD-10-CM | POA: Diagnosis not present

## 2023-01-16 DIAGNOSIS — Z86010 Personal history of colonic polyps: Secondary | ICD-10-CM | POA: Diagnosis not present

## 2023-01-16 DIAGNOSIS — D696 Thrombocytopenia, unspecified: Secondary | ICD-10-CM | POA: Diagnosis not present

## 2023-01-16 DIAGNOSIS — K760 Fatty (change of) liver, not elsewhere classified: Secondary | ICD-10-CM | POA: Diagnosis not present

## 2023-01-16 DIAGNOSIS — R1310 Dysphagia, unspecified: Secondary | ICD-10-CM | POA: Diagnosis not present

## 2023-01-16 DIAGNOSIS — D649 Anemia, unspecified: Secondary | ICD-10-CM | POA: Diagnosis not present

## 2023-01-16 DIAGNOSIS — K9089 Other intestinal malabsorption: Secondary | ICD-10-CM | POA: Diagnosis not present

## 2023-01-17 ENCOUNTER — Other Ambulatory Visit: Payer: Medicare HMO

## 2023-01-17 ENCOUNTER — Ambulatory Visit
Admission: RE | Admit: 2023-01-17 | Discharge: 2023-01-17 | Disposition: A | Discharge status: Home / Self Care | Payer: Medicare HMO | Source: Ambulatory Visit | Attending: Surgery | Admitting: Surgery

## 2023-01-17 DIAGNOSIS — K439 Ventral hernia without obstruction or gangrene: Secondary | ICD-10-CM | POA: Insufficient documentation

## 2023-01-17 DIAGNOSIS — N3289 Other specified disorders of bladder: Secondary | ICD-10-CM | POA: Diagnosis not present

## 2023-01-17 DIAGNOSIS — N3090 Cystitis, unspecified without hematuria: Secondary | ICD-10-CM | POA: Diagnosis not present

## 2023-01-17 DIAGNOSIS — R1031 Right lower quadrant pain: Secondary | ICD-10-CM | POA: Diagnosis not present

## 2023-01-17 DIAGNOSIS — K5730 Diverticulosis of large intestine without perforation or abscess without bleeding: Secondary | ICD-10-CM | POA: Diagnosis not present

## 2023-01-17 MED ORDER — IOHEXOL 9 MG/ML PO SOLN
500.0000 mL | ORAL | Status: AC
  Administered 2023-01-17 (×2): 500 mL via ORAL

## 2023-01-18 ENCOUNTER — Other Ambulatory Visit: Payer: Self-pay | Admitting: Family Medicine

## 2023-01-18 DIAGNOSIS — E1149 Type 2 diabetes mellitus with other diabetic neurological complication: Secondary | ICD-10-CM

## 2023-01-19 NOTE — Telephone Encounter (Signed)
Requested Prescriptions  Pending Prescriptions Disp Refills   gabapentin (NEURONTIN) 100 MG capsule [Pharmacy Med Name: Gabapentin 100 MG Oral Capsule] 270 capsule 1    Sig: TAKE 1 CAPSULE BY MOUTH THREE TIMES DAILY     Neurology: Anticonvulsants - gabapentin Failed - 01/18/2023  9:44 AM      Failed - Cr in normal range and within 360 days    Creatinine  Date Value Ref Range Status  03/01/2012 1.72 (H) 0.60 - 1.30 mg/dL Final   Creatinine, Ser  Date Value Ref Range Status  12/25/2022 1.74 (H) 0.57 - 1.00 mg/dL Final   Creatinine, POC  Date Value Ref Range Status  08/02/2017 NA mg/dL Final         Passed - Completed PHQ-2 or PHQ-9 in the last 360 days      Passed - Valid encounter within last 12 months    Recent Outpatient Visits           3 weeks ago Stage 3 chronic kidney disease, unspecified whether stage 3a or 3b CKD (HCC)   [facility] Simmons-Robinson, [place], MD   4 months ago Screening for colon cancer   [facility] Simmons-Robinson, [place], MD   7 months ago SOB (shortness of breath)   [facility] Simmons-Robinson, [place], MD   7 months ago Primary hypertension   [place] [facility] Simmons-Robinson, [place], MD   8 months ago Primary hypertension   [place] [facility] Simmons-Robinson, [place], MD       Future Appointments             In 1 week Chandrasekhar, Rondel Jumbo, MD [facility], [facility]   In 3 months Simmons-Robinson, Financial controller, MD [facility], [place]

## 2023-01-30 ENCOUNTER — Encounter: Payer: Self-pay | Admitting: Internal Medicine

## 2023-01-30 ENCOUNTER — Ambulatory Visit: Payer: Medicare HMO | Attending: Internal Medicine | Admitting: Internal Medicine

## 2023-01-30 VITALS — BP 118/72 | HR 55 | Ht 61.0 in | Wt 198.2 lb

## 2023-01-30 DIAGNOSIS — E1169 Type 2 diabetes mellitus with other specified complication: Secondary | ICD-10-CM | POA: Diagnosis not present

## 2023-01-30 DIAGNOSIS — I739 Peripheral vascular disease, unspecified: Secondary | ICD-10-CM | POA: Diagnosis not present

## 2023-01-30 DIAGNOSIS — N183 Chronic kidney disease, stage 3 unspecified: Secondary | ICD-10-CM | POA: Diagnosis not present

## 2023-01-30 DIAGNOSIS — E785 Hyperlipidemia, unspecified: Secondary | ICD-10-CM

## 2023-01-30 DIAGNOSIS — Z7984 Long term (current) use of oral hypoglycemic drugs: Secondary | ICD-10-CM | POA: Diagnosis not present

## 2023-01-30 DIAGNOSIS — I10 Essential (primary) hypertension: Secondary | ICD-10-CM | POA: Diagnosis not present

## 2023-01-30 NOTE — Progress Notes (Signed)
[id number]  Cardiology Office Note:    Date:  01/30/2023   ID:  Cynthia Sims, DOB Feb 23, 1951, MRN [id number]  PCP:  Cynthia Ramp, MD   [facility] Providers Cardiologist:  Cynthia Constant, MD     Referring MD: Cynthia Sims*   CC: Moderate CAD  History of Present Illness:    Cynthia Sims is a 72 y.o. female with a hx of HTN with DM, OSA, CKD stage IIIa, HLD who presents with SOB.Morbid Obesity Aortic atherosclerosis.  2024: Moderate non obstructive CAD. Saw Cynthia Sims.  Saw [facility] for PAD and had intervention.  Patient notes that she is doing ok.   Since last visit notes that she had some leg pain and claudication.  . There are no interval hospital/ED visit.   Worsening kidney function  Notes rare chest pain.  Rare; no PRN nitroglycerin.  Has pain usually once or twice a weeks.    Notes persistent DOE that improves with rest. No PND/Orthopnea.  Stable weight. .  No palpitations or syncope.   Past Medical History:  Diagnosis Date   ALT (SGPT) level raised    Anemia    Anxiety    Arthritis    Depression    Diabetes mellitus without complication (HCC)    Elevated cholesterol    GERD (gastroesophageal reflux disease)    Hemorrhoids    Hypertension    Kidney disease     Past Surgical History:  Procedure Laterality Date   abdominal blockage     CESAREAN SECTION  1983   CESAREAN SECTION     CHOLECYSTECTOMY     COLONOSCOPY WITH PROPOFOL N/A 10/29/2015   Procedure: COLONOSCOPY WITH PROPOFOL;  Surgeon: Cynthia Jun, MD;  Location: [facility] ENDOSCOPY;  Service: Endoscopy;  Laterality: N/A;   COLONOSCOPY WITH PROPOFOL N/A 11/07/2017   Procedure: COLONOSCOPY WITH PROPOFOL;  Surgeon: Toledo, Boykin Nearing, MD;  Location: [facility] ENDOSCOPY;  Service: Gastroenterology;  Laterality: N/A;   DILATION AND CURETTAGE OF UTERUS  2001   ESOPHAGOGASTRODUODENOSCOPY (EGD) WITH PROPOFOL N/A 10/29/2015   Procedure: ESOPHAGOGASTRODUODENOSCOPY (EGD) WITH PROPOFOL;   Surgeon: Cynthia Jun, MD;  Location: [facility] ENDOSCOPY;  Service: Endoscopy;  Laterality: N/A;   LOWER EXTREMITY ANGIOGRAPHY Right 10/24/2022   Procedure: Lower Extremity Angiography;  Surgeon: Cynthia Dills, MD;  Location: [facility] INVASIVE CV LAB;  Service: Cardiovascular;  Laterality: Right;   LOWER EXTREMITY ANGIOGRAPHY Left 10/31/2022   Procedure: Lower Extremity Angiography;  Surgeon: Cynthia Dills, MD;  Location: [facility] INVASIVE CV LAB;  Service: Cardiovascular;  Laterality: Left;    Current Medications: Current Meds  Medication Sig   acetaminophen (TYLENOL) 500 MG tablet Take 500 mg by mouth every 6 (six) hours as needed.    aspirin EC 81 MG tablet Take 81 mg by mouth daily.    carvedilol (COREG) 25 MG tablet Take 1 tablet by mouth twice daily   cholestyramine (QUESTRAN) 4 g packet DISSOLVE AND TAKE ONE PACKET BY MOUTH THREE TIMES DAILY   clopidogrel (PLAVIX) 75 MG tablet Take 1 tablet (75 mg total) by mouth daily.   cyanocobalamin 1000 MCG tablet Take 1,000 mcg by mouth daily.   DULoxetine (CYMBALTA) 60 MG capsule Take 1 capsule by mouth once daily   gabapentin (NEURONTIN) 100 MG capsule TAKE 1 CAPSULE BY MOUTH THREE TIMES DAILY   glipiZIDE (GLUCOTROL) 10 MG tablet Take 1 tablet (10 mg total) by mouth 2 (two) times daily.   glucose blood (ONETOUCH ULTRA) test strip 1 each by Other route  3 (three) times daily. Use as instructed   Insulin Glargine (BASAGLAR KWIKPEN) 100 UNIT/ML INJECT 24 UNITS SUBCUTANEOUSLY ONCE DAILY   irbesartan (AVAPRO) 300 MG tablet Take 1 tablet (300 mg total) by mouth daily.   isosorbide mononitrate (IMDUR) 30 MG 24 hr tablet Take 1 tablet (30 mg total) by mouth daily.   Lancets (ONETOUCH DELICA PLUS LANCET33G) MISC USE 1  TO CHECK GLUCOSE ONCE DAILY   lovastatin (MEVACOR) 40 MG tablet Take 1 tablet by mouth once daily   metFORMIN (GLUCOPHAGE) 500 MG tablet Take 1 tablet (500 mg total) by mouth 2 (two) times daily with a meal.   nitroGLYCERIN (NITROSTAT)  0.4 MG SL tablet Place 1 tablet (0.4 mg total) under the tongue every 5 (five) minutes as needed for chest pain.   pantoprazole (PROTONIX) 40 MG tablet Take 1 tablet (40 mg total) by mouth daily.     Allergies:   Patient has no known allergies.   Social History   Socioeconomic History   Marital status: Married    Spouse name: Not on file   Number of children: 5   Years of education: Not on file   Highest education level: 8th grade  Occupational History   Occupation: retired  Tobacco Use   Smoking status: Never    Passive exposure: Never   Smokeless tobacco: Never  Vaping Use   Vaping status: Never Used  Substance and Sexual Activity   Alcohol use: No    Alcohol/week: 0.0 standard drinks of alcohol   Drug use: No   Sexual activity: Yes    Birth control/protection: Post-menopausal  Other Topics Concern   Not on file  Social History Narrative   Applied for food stamps. Pt is living off social security money and is raising one of her grandchildren.    Social Determinants of Health   Financial Resource Strain: Low Risk  (10/04/2022)   Overall Financial Resource Strain (CARDIA)    Difficulty of Paying Living Expenses: Not hard at all  Food Insecurity: No Food Insecurity (10/04/2022)   Hunger Vital Sign    Worried About Running Out of Food in the Last Year: Never true    Ran Out of Food in the Last Year: Never true  Transportation Needs: No Transportation Needs (10/04/2022)   PRAPARE - Administrator, Civil Service (Medical): No    Lack of Transportation (Non-Medical): No  Physical Activity: Inactive (10/04/2022)   Exercise Vital Sign    Days of Exercise per Week: 0 days    Minutes of Exercise per Session: 0 min  Stress: No Stress Concern Present (10/04/2022)   [employer] of Occupational Health - Occupational Stress Questionnaire    Feeling of Stress : Not at all  Social Connections: Moderately Integrated (10/04/2022)   Social Connection and Isolation Panel  [NHANES]    Frequency of Communication with Friends and Family: More than three times a week    Frequency of Social Gatherings with Friends and Family: Once a week    Attends Religious Services: 1 to 4 times per year    Active Member of [employer] or Organizations: No    Attends Engineer, structural: Never    Marital Status: Married     Family History: The patient's family history includes Alzheimer's disease in her mother; Breast cancer (age of onset: 52) in her mother; COPD in her maternal grandmother and mother; Diabetes in her father, paternal grandfather, sister, sister, and sister; Heart attack in her father; Hypertension in  her father; Lupus in her father; Osteoporosis in her mother.  ROS:   Please see the history of present illness.     EKGs/Labs/Other Studies Reviewed:    The following studies were reviewed today:  EKG:   07/13/22: sinus bradycardia 1st heart block Cardiac Studies & Procedures       ECHOCARDIOGRAM  ECHOCARDIOGRAM COMPLETE 08/09/2022  Narrative ECHOCARDIOGRAM REPORT    Patient Name:   TATIANAH SUSTAITA Date of Exam: 08/09/2022 Medical Rec #:  [id number]       Height:       61.0 in Accession #:    [id number]      Weight:       192.0 lb Date of Birth:  [birth year]       BSA:          1.856 m Patient Age:    71 years        BP:           162/90 mmHg Patient Gender: F               HR:           67 bpm. Exam Location:  [address]  Procedure: 2D Echo, Cardiac Doppler, Color Doppler and Strain Analysis  Indications:    R06.00 Dyspnea  History:        Patient has no prior history of Echocardiogram examinations. CKD, Signs/Symptoms:Chest Pain; Risk Factors:Hypertension, Diabetes, Sleep Apnea and HLD.  Sonographer:    Clearence Ped RCS Referring Phys: [id number] Dustin Bumbaugh A Zaydan Papesh  IMPRESSIONS   1. Left ventricular ejection fraction, by estimation, is 60 to 65%. The left ventricle has normal function. The left ventricle has no regional wall  motion abnormalities. There is mild concentric left ventricular hypertrophy. GLS - 20.5%. No diastolic dysfunction. 2. Right ventricular systolic function is normal. The right ventricular size is normal. There is normal pulmonary artery systolic pressure. 3. The mitral valve is normal in structure. No evidence of mitral valve regurgitation. No evidence of mitral stenosis. 4. There is mild aortic valve regurgitation due to poor coaptation at the noncoronary cusp. 5. The inferior vena cava is normal in size with greater than 50% respiratory variability, suggesting right atrial pressure of 3 mmHg.  FINDINGS Left Ventricle: Left ventricular ejection fraction, by estimation, is 60 to 65%. The left ventricle has normal function. The left ventricle has no regional wall motion abnormalities. The left ventricular internal cavity size was normal in size. There is mild concentric left ventricular hypertrophy. Left ventricular diastolic parameters were normal.  Right Ventricle: The right ventricular size is normal. No increase in right ventricular wall thickness. Right ventricular systolic function is normal. There is normal pulmonary artery systolic pressure. The tricuspid regurgitant velocity is 2.37 m/s, and with an assumed right atrial pressure of 3 mmHg, the estimated right ventricular systolic pressure is 25.5 mmHg.  Left Atrium: Left atrial size was normal in size.  Right Atrium: Right atrial size was normal in size.  Pericardium: There is no evidence of pericardial effusion.  Mitral Valve: The mitral valve is normal in structure. No evidence of mitral valve regurgitation. No evidence of mitral valve stenosis.  Tricuspid Valve: The tricuspid valve is normal in structure. Tricuspid valve regurgitation is mild . No evidence of tricuspid stenosis.  Aortic Valve: The aortic valve is normal in structure. Aortic valve regurgitation is mild. Aortic regurgitation PHT measures 1011 msec. No aortic stenosis  is present.  Pulmonic Valve: The pulmonic valve was normal in structure.  Pulmonic valve regurgitation is not visualized. No evidence of pulmonic stenosis.  Aorta: The aortic root is normal in size and structure.  Venous: The inferior vena cava is normal in size with greater than 50% respiratory variability, suggesting right atrial pressure of 3 mmHg.  IAS/Shunts: No atrial level shunt detected by color flow Doppler.   LEFT VENTRICLE PLAX 2D LVIDd:         3.60 cm   Diastology LVIDs:         2.40 cm   LV e' medial:    8.16 cm/s LV PW:         1.20 cm   LV E/e' medial:  10.7 LV IVS:        1.10 cm   LV e' lateral:   6.74 cm/s LVOT diam:     1.90 cm   LV E/e' lateral: 13.0 LV SV:         63 LV SV Index:   34        2D Longitudinal Strain LVOT Area:     2.84 cm  2D Strain GLS (A2C):   -21.7 % 2D Strain GLS (A3C):   -19.6 % 2D Strain GLS (A4C):   -20.1 % 2D Strain GLS Avg:     -20.5 %  RIGHT VENTRICLE RV Basal diam:  2.80 cm RV S prime:     12.10 cm/s TAPSE (M-mode): 2.4 cm RVSP:           25.5 mmHg  LEFT ATRIUM             Index        RIGHT ATRIUM           Index LA diam:        3.70 cm 1.99 cm/m   RA Pressure: 3.00 mmHg LA Vol (A2C):   39.2 ml 21.12 ml/m  RA Area:     8.54 cm LA Vol (A4C):   22.1 ml 11.91 ml/m  RA Volume:   14.90 ml  8.03 ml/m LA Biplane Vol: 29.3 ml 15.78 ml/m AORTIC VALVE LVOT Vmax:   84.90 cm/s LVOT Vmean:  59.700 cm/s LVOT VTI:    0.222 m AI PHT:      1011 msec  AORTA Ao Root diam: 3.00 cm Ao Asc diam:  3.80 cm  MITRAL VALVE               TRICUSPID VALVE MV Area (PHT):             TR Peak grad:   22.5 mmHg MV Decel Time:             TR Vmax:        237.00 cm/s MV E velocity: 87.50 cm/s  Estimated RAP:  3.00 mmHg MV A velocity: 80.20 cm/s  RVSP:           25.5 mmHg MV E/A ratio:  1.09 SHUNTS Systemic VTI:  0.22 m Systemic Diam: 1.90 cm  Aditya Sabharwal Electronically signed by Dorthula Nettles Signature Date/Time: 08/09/2022/3:15:22  PM    Final     CT SCANS  CT CORONARY MORPH W/CTA COR W/SCORE 08/07/2022  Addendum 08/08/2022 11:46 PM ADDENDUM REPORT: 08/08/2022 23:44  EXAM: OVER-READ INTERPRETATION  CT CHEST  The following report is an over-read performed by radiologist Dr. Carola Frost [facility] on 08/08/2022. This over-read does not include interpretation of cardiac or coronary anatomy or pathology. The coronary CTA interpretation by the cardiologist is attached.  COMPARISON:  None.  FINDINGS: There are no enlarged lymph nodes identified. Visualized esophagus is within normal limits. Visualized lungs are clear. Osseous structures are within normal limits.  IMPRESSION: No acute or significant extracardiac findings.   Electronically Signed By: Darliss Cheney M.D. On: 08/08/2022 23:44  Narrative CLINICAL DATA:  Chest pain  EXAM: Cardiac/Coronary  CTA  TECHNIQUE: The patient was scanned on a Siemens Somatom go.Top scanner.  : A retrospective scan was triggered in the ascending thoracic aorta. Axial non-contrast 3 mm slices were carried out through the heart. The data set was analyzed on a dedicated work station and scored using the Agatson method. Gantry rotation speed was 330 msecs and collimation was .6 mm. 25 mg of metoprolol and 0.8 mg of sl NTG was given. The 3D data set was reconstructed in 5% intervals of the 60-95 % of the R-R cycle. Diastolic phases were analyzed on a dedicated work station using MPR, MIP and VRT modes. The patient received 75 cc of contrast.  FINDINGS: Aorta: Normal size. Mild aortic root and descending aorta calcifications. No dissection.  Aortic Valve:  Trileaflet.  No calcifications.  Coronary Arteries:  Normal coronary origin.  Right dominance.  RCA is a dominant artery. There is minimal calcified plaque proximally causing minimal stenosis (<25%).  Left main gives rise to LAD and LCX arteries. LM has no disease.  LAD has calcified  plaque proximally causing moderate stenosis (50%).  LCX is a non-dominant artery. There is calcified plaque in the proximal RCA causing mild stenosis (25-49%).  Other findings:  Normal pulmonary vein drainage into the left atrium.  Normal left atrial appendage without a thrombus.  Normal size of the pulmonary artery.  IMPRESSION: 1. Coronary calcium score of 398. This was 86th percentile for age and sex matched control.  2. Normal coronary origin with right dominance.  3. Moderate proximal LAD stenosis (50%).  4. Mild proximal LCx stenosis (25-49%).  5. CAD-RADS 3. Moderate stenosis. Consider symptom-guided anti-ischemic pharmacotherapy as well as risk factor modification per guideline directed care.  6. Additional analysis with CT FFR will be submitted.  Electronically Signed: By: Debbe Odea M.D. On: 08/07/2022 14:18         Recent Labs: 06/09/2022: ALT 14; BNP 21.9 12/25/2022: BUN 28; Creatinine, Ser 1.74; Hemoglobin 9.8; Platelets 145; Potassium 5.6; Sodium 139  Recent Lipid Panel    Component Value Date/Time   CHOL 131 12/25/2022 0925   TRIG 168 (H) 12/25/2022 0925   HDL 58 12/25/2022 0925   CHOLHDL 2.3 12/25/2022 0925   LDLCALC 45 12/25/2022 0925       Physical Exam:    VS:  BP 118/72   Pulse (!) 55   Ht 5\' 1"  (1.549 m)   Wt 198 lb 3.2 oz (89.9 kg)   SpO2 97%   BMI 37.45 kg/m     Wt Readings from Last 3 Encounters:  01/30/23 198 lb 3.2 oz (89.9 kg)  01/09/23 195 lb (88.5 kg)  12/25/22 198 lb 6.4 oz (90 kg)    GEN:   no acute distress Morbid obesity HEENT: Normal NECK: No JVD CARDIAC: regular bradycardia, no murmurs, rubs, gallops RESPIRATORY:  Clear to auscultation without rales, wheezing or rhonchi  ABDOMEN: Soft, non-tender, non-distended MUSCULOSKELETAL:  trace bilateral edema; No deformity  SKIN: Warm and dry NEUROLOGIC:  Alert and oriented x 3 PSYCHIATRIC:  Normal affect   ASSESSMENT:    1. PAD (peripheral artery disease)  (HCC)   2. Essential hypertension   3. Hyperlipidemia associated with  type 2 diabetes mellitus (HCC)   4. Stage 3 chronic kidney disease, unspecified whether stage 3a or 3b CKD (HCC)     PLAN:    CKD and Hyperkalemia LE swelling - will get BMP and BNP; may offer 20 mg PO PRN Lasix  Morbid obesity PAD with DM - no history of MEN2a or medullary thyroid cancer - no history of pancreatitis or gallstones. - BMI is 30+  with weight related comorbidities including  PAD and DM - would meet criteria for GLP-1 based therapy - referr to Pharm D   Moderate non obstructive CAD  HLD and aortic atherosclerosis SB with 1st HB - her DAPT is for PAD managed by [facility] (recent intervention) - continue ARB, BB, Imdur - LDL at goal on current therapy - encouraged PRN nitroglycerin use as needed; if worsening symptoms I would offer LHC with invasive FFR or LAD  Six months with me      Medication Adjustments/Labs and Tests Ordered: Current medicines are reviewed at length with the patient today.  Concerns regarding medicines are outlined above.  No orders of the defined types were placed in this encounter.  No orders of the defined types were placed in this encounter.   There are no Patient Instructions on file for this visit.   Signed, Cynthia Constant, MD  01/30/2023 11:35 AM    [facility]

## 2023-01-30 NOTE — Addendum Note (Signed)
Addended by: Macie Burows on: 01/30/2023 11:40 AM   Modules accepted: Orders

## 2023-01-30 NOTE — Patient Instructions (Signed)
Medication Instructions:  Your physician recommends that you continue on your current medications as directed. Please refer to the Current Medication list given to you today.  *If you need a refill on your cardiac medications before your next appointment, please call your pharmacy*   Lab Work: BMP, BNP  If you have labs (blood work) drawn today and your tests are completely normal, you will receive your results only by: MyChart Message (if you have MyChart) OR A paper copy in the mail If you have any lab test that is abnormal or we need to change your treatment, we will call you to review the results.   Testing/Procedures: Your physician has referred you to our Pharmacy Clinic.    Follow-Up: At [facility], you and your health needs are our priority.  As part of our continuing mission to provide you with exceptional heart care, we have created designated Provider Care Teams.  These Care Teams include your primary Cardiologist (physician) and Advanced Practice Providers (APPs -  Physician Assistants and Nurse Practitioners) who all work together to provide you with the care you need, when you need it.   Your next appointment:   6 month(s)  Provider:   Jari Favre, PA-C  or Riley Lam, MD

## 2023-01-31 ENCOUNTER — Telehealth: Payer: Self-pay | Admitting: Internal Medicine

## 2023-01-31 ENCOUNTER — Telehealth: Payer: Self-pay

## 2023-01-31 DIAGNOSIS — I10 Essential (primary) hypertension: Secondary | ICD-10-CM

## 2023-01-31 LAB — BASIC METABOLIC PANEL
BUN/Creatinine Ratio: 12 (ref 12–28)
BUN: 21 mg/dL (ref 8–27)
CO2: 17 mmol/L — ABNORMAL LOW (ref 20–29)
Calcium: 8.1 mg/dL — ABNORMAL LOW (ref 8.7–10.3)
Chloride: 109 mmol/L — ABNORMAL HIGH (ref 96–106)
Creatinine, Ser: 1.7 mg/dL — ABNORMAL HIGH (ref 0.57–1.00)
Glucose: 73 mg/dL (ref 70–99)
Potassium: 4.1 mmol/L (ref 3.5–5.2)
Sodium: 141 mmol/L (ref 134–144)
eGFR: 31 mL/min/{1.73_m2} — ABNORMAL LOW (ref >59)

## 2023-01-31 LAB — PRO B NATRIURETIC PEPTIDE: NT-Pro BNP: 395 pg/mL — ABNORMAL HIGH (ref 0–301)

## 2023-01-31 MED ORDER — EMPAGLIFLOZIN 10 MG PO TABS: 10.0000 mg | ORAL_TABLET | Freq: Every day | ORAL | 3 refills | Status: DC

## 2023-01-31 MED ORDER — EMPAGLIFLOZIN 10 MG PO TABS: 10.0000 mg | ORAL_TABLET | Freq: Every day | ORAL | Status: DC

## 2023-01-31 NOTE — Telephone Encounter (Signed)
-----   Message from Christell Constant sent at 01/31/2023 11:37 AM EDT ----- Results: K has improved Cr is stable; CKD may be her new baseline BNP elevated Jardiance 10 start, BMP in two weeks, may need nephology referral  Christell Constant, MD

## 2023-01-31 NOTE — Telephone Encounter (Signed)
Patient calling the office for samples of medication:   1.  What medication and dosage are you requesting samples for?   empagliflozin (JARDIANCE) 10 MG TABS tablet    2.  Are you currently out of this medication? Yes    Per providers nurse Alena Bills) pt is not able to go to [address] offie to get samples and would like to know if pt is able to go to [place] office to pickup.

## 2023-01-31 NOTE — Telephone Encounter (Signed)
Patient requesting samples of Jardiance. Called patient and let her know that samples are ready to be picked up. Samples placed in sample closet.

## 2023-01-31 NOTE — Telephone Encounter (Signed)
Called pt reports will pickup Jardiance samples tomorrow.  [facility]. Lab orders placed.

## 2023-01-31 NOTE — Telephone Encounter (Signed)
The patient has been notified of the result and verbalized understanding.  All questions (if any) were answered. [name], RN 01/31/2023 3:07 PM    Pt reports it would be easier to pick up samples from [place] office.  I called [facility] office.  Left a message asking team to provide pt with samples and free trial offer.  I was told by scheduler that a message would be sent for [name] team to f/u with pt.

## 2023-02-01 ENCOUNTER — Other Ambulatory Visit: Payer: Self-pay | Admitting: Family Medicine

## 2023-02-01 ENCOUNTER — Telehealth: Payer: Self-pay | Admitting: Family Medicine

## 2023-02-01 DIAGNOSIS — N183 Chronic kidney disease, stage 3 unspecified: Secondary | ICD-10-CM

## 2023-02-01 DIAGNOSIS — E1122 Type 2 diabetes mellitus with diabetic chronic kidney disease: Secondary | ICD-10-CM

## 2023-02-01 MED ORDER — METFORMIN HCL 500 MG PO TABS
500.0000 mg | ORAL_TABLET | Freq: Two times a day (BID) | ORAL | 1 refills | Status: DC
Start: 2023-02-01 — End: 2023-03-01

## 2023-02-01 MED ORDER — GLIPIZIDE 10 MG PO TABS
10.0000 mg | ORAL_TABLET | Freq: Two times a day (BID) | ORAL | 1 refills | Status: DC
Start: 2023-02-01 — End: 2023-02-22

## 2023-02-01 NOTE — Telephone Encounter (Signed)
refilled 

## 2023-02-01 NOTE — Telephone Encounter (Signed)
[facility] faxed refill request for the following medications:   glipiZIDE (GLUCOTROL) 10 MG tablet     metFORMIN (GLUCOPHAGE) 500 MG tablet     Please advise.

## 2023-02-01 NOTE — Telephone Encounter (Signed)
Medication Refill - Medication: glipiZIDE (GLUCOTROL) 10 MG tablet  ,metFORMIN (GLUCOPHAGE) 500 MG tablet    Has the patient contacted their pharmacy? Yes.    (Agent: If yes, when and what did the pharmacy advise?)  Preferred Pharmacy (with phone number or street name):  [facility] [address], [place] - [address]  [address] [place] [address]  Phone: [phone] Fax: [phone]  Hours: Not open 24 hours   Has the patient been seen for an appointment in the last year OR does the patient have an upcoming appointment? Yes.    Agent: Please be advised that RX refills may take up to 3 business days. We ask that you follow-up with your pharmacy.

## 2023-02-08 DIAGNOSIS — E113393 Type 2 diabetes mellitus with moderate nonproliferative diabetic retinopathy without macular edema, bilateral: Secondary | ICD-10-CM | POA: Diagnosis not present

## 2023-02-08 DIAGNOSIS — Z961 Presence of intraocular lens: Secondary | ICD-10-CM | POA: Diagnosis not present

## 2023-02-08 DIAGNOSIS — H35033 Hypertensive retinopathy, bilateral: Secondary | ICD-10-CM | POA: Diagnosis not present

## 2023-02-08 LAB — HM DIABETES EYE EXAM

## 2023-02-13 NOTE — Progress Notes (Deleted)
Patient ID: Cynthia Sims                 DOB: [birth year]                    MRN: [id number]     HPI: Cynthia Sims is a 72 y.o. female patient referred to pharmacy clinic by Dr Izora Ribas to initiate GLP1-RA therapy. PMH is significant for HTN, PAD, DM2, CKD IIIa, HLD, OSA, aortic atherosclerosis, and obesity.   A1c most recently 7.4% on 12/25/22. For glucose control, pt is on basaglar insulin, metformin, glipizide, and was recently started on Jardiance a few weeks ago.  Weigh pt Needed jardiance samples even though not in donut hole -   Mounjaro 47/141 needs PA (jardiance is the same copay if not in donut hole) Would be able to dec then stop her glipizide but need to confirm first  Diet:   Exercise:   Family History: Mother with breast cancer, osteoporosis, COPD, and Alzheimer's. Father with MI, DM, HTN, and lupus. Sister with DM.   Social History: No tobacco, alcohol or drug use.  Labs: Lab Results  Component Value Date   HGBA1C 7.4 (H) 12/25/2022    Wt Readings from Last 1 Encounters:  01/30/23 198 lb 3.2 oz (89.9 kg)    BP Readings from Last 1 Encounters:  01/30/23 118/72   Pulse Readings from Last 1 Encounters:  01/30/23 (!) 55       Component Value Date/Time   CHOL 131 12/25/2022 0925   TRIG 168 (H) 12/25/2022 0925   HDL 58 12/25/2022 0925   CHOLHDL 2.3 12/25/2022 0925   LDLCALC 45 12/25/2022 0925    Past Medical History:  Diagnosis Date   ALT (SGPT) level raised    Anemia    Anxiety    Arthritis    Depression    Diabetes mellitus without complication (HCC)    Elevated cholesterol    GERD (gastroesophageal reflux disease)    Hemorrhoids    Hypertension    Kidney disease     Current Outpatient Medications on File Prior to Visit  Medication Sig Dispense Refill   acetaminophen (TYLENOL) 500 MG tablet Take 500 mg by mouth every 6 (six) hours as needed.      aspirin EC 81 MG tablet Take 81 mg by mouth daily.      carvedilol (COREG) 25 MG  tablet Take 1 tablet by mouth twice daily 180 tablet 2   cholestyramine (QUESTRAN) 4 g packet DISSOLVE AND TAKE ONE PACKET BY MOUTH THREE TIMES DAILY 270 each 1   clopidogrel (PLAVIX) 75 MG tablet Take 1 tablet (75 mg total) by mouth daily. 30 tablet 5   cyanocobalamin 1000 MCG tablet Take 1,000 mcg by mouth daily.     DULoxetine (CYMBALTA) 60 MG capsule Take 1 capsule by mouth once daily 90 capsule 1   empagliflozin (JARDIANCE) 10 MG TABS tablet Take 1 tablet (10 mg total) by mouth daily before breakfast.     gabapentin (NEURONTIN) 100 MG capsule TAKE 1 CAPSULE BY MOUTH THREE TIMES DAILY 270 capsule 1   glipiZIDE (GLUCOTROL) 10 MG tablet Take 1 tablet (10 mg total) by mouth 2 (two) times daily. 180 tablet 1   glucose blood (ONETOUCH ULTRA) test strip 1 each by Other route 3 (three) times daily. Use as instructed 100 each 2   Insulin Glargine (BASAGLAR KWIKPEN) 100 UNIT/ML INJECT 24 UNITS SUBCUTANEOUSLY ONCE DAILY 15 mL 2   irbesartan (AVAPRO) 300 MG  tablet Take 1 tablet (300 mg total) by mouth daily. 90 tablet 3   isosorbide mononitrate (IMDUR) 30 MG 24 hr tablet Take 1 tablet (30 mg total) by mouth daily. 90 tablet 3   Lancets (ONETOUCH DELICA PLUS LANCET33G) MISC USE 1  TO CHECK GLUCOSE ONCE DAILY 100 each 3   lovastatin (MEVACOR) 40 MG tablet Take 1 tablet by mouth once daily 90 tablet 1   metFORMIN (GLUCOPHAGE) 500 MG tablet Take 1 tablet (500 mg total) by mouth 2 (two) times daily with a meal. 180 tablet 1   nitroGLYCERIN (NITROSTAT) 0.4 MG SL tablet Place 1 tablet (0.4 mg total) under the tongue every 5 (five) minutes as needed for chest pain. 25 tablet 3   pantoprazole (PROTONIX) 40 MG tablet Take 1 tablet (40 mg total) by mouth daily. 30 tablet 11   No current facility-administered medications on file prior to visit.    No Known Allergies   Assessment/Plan:  1. Weight loss - Patient has not met goal of at least 5% of body weight loss with comprehensive lifestyle modifications alone  in the past 3-6 months. Pharmacotherapy is appropriate to pursue as augmentation. Will start ***. Confirmed patient not ***pregnant and no personal or family history of medullary thyroid carcinoma (MTC) or Multiple Endocrine Neoplasia syndrome type 2 (MEN 2). Injection technique reviewed at today's visit.  Advised patient on common side effects including nausea, diarrhea, dyspepsia, decreased appetite, and fatigue. Counseled patient on reducing meal size and how to titrate medication to minimize side effects. Counseled patient to call if intolerable side effects or if experiencing dehydration, abdominal pain, or dizziness. Patient will adhere to dietary modifications and will target at least 150 minutes of moderate intensity exercise weekly.   Follow up in 1 month via telephone for tolerability update and dose titration.

## 2023-02-14 ENCOUNTER — Ambulatory Visit: Payer: Medicare HMO

## 2023-02-14 ENCOUNTER — Other Ambulatory Visit: Payer: Self-pay | Admitting: Family Medicine

## 2023-02-14 DIAGNOSIS — E0842 Diabetes mellitus due to underlying condition with diabetic polyneuropathy: Secondary | ICD-10-CM

## 2023-02-14 DIAGNOSIS — F411 Generalized anxiety disorder: Secondary | ICD-10-CM

## 2023-02-15 ENCOUNTER — Telehealth (INDEPENDENT_AMBULATORY_CARE_PROVIDER_SITE_OTHER): Payer: Self-pay

## 2023-02-15 NOTE — Telephone Encounter (Signed)
Patient reach out to the office stating that she is having a severe pain with left leg (calf) with some swelling that started yesterday. The patient pain level is 10. She is schedule to come in 02/19/23. Please Advise

## 2023-02-15 NOTE — Telephone Encounter (Signed)
We can see if we can move up her visit and studies

## 2023-02-16 ENCOUNTER — Ambulatory Visit (INDEPENDENT_AMBULATORY_CARE_PROVIDER_SITE_OTHER): Payer: Medicare HMO

## 2023-02-16 ENCOUNTER — Ambulatory Visit: Payer: Medicare HMO | Admitting: Physician Assistant

## 2023-02-16 ENCOUNTER — Other Ambulatory Visit (INDEPENDENT_AMBULATORY_CARE_PROVIDER_SITE_OTHER): Payer: Self-pay | Admitting: Vascular Surgery

## 2023-02-16 DIAGNOSIS — I739 Peripheral vascular disease, unspecified: Secondary | ICD-10-CM

## 2023-02-16 DIAGNOSIS — Z9889 Other specified postprocedural states: Secondary | ICD-10-CM

## 2023-02-19 ENCOUNTER — Encounter (INDEPENDENT_AMBULATORY_CARE_PROVIDER_SITE_OTHER): Payer: Self-pay | Admitting: Vascular Surgery

## 2023-02-19 ENCOUNTER — Ambulatory Visit (INDEPENDENT_AMBULATORY_CARE_PROVIDER_SITE_OTHER): Payer: Medicare HMO | Admitting: Vascular Surgery

## 2023-02-19 ENCOUNTER — Other Ambulatory Visit: Payer: Self-pay | Admitting: Family Medicine

## 2023-02-19 ENCOUNTER — Encounter (INDEPENDENT_AMBULATORY_CARE_PROVIDER_SITE_OTHER): Payer: Medicare HMO

## 2023-02-19 VITALS — BP 116/79 | HR 65 | Resp 16 | Wt 194.6 lb

## 2023-02-19 DIAGNOSIS — I70219 Atherosclerosis of native arteries of extremities with intermittent claudication, unspecified extremity: Secondary | ICD-10-CM | POA: Insufficient documentation

## 2023-02-19 DIAGNOSIS — E0842 Diabetes mellitus due to underlying condition with diabetic polyneuropathy: Secondary | ICD-10-CM

## 2023-02-19 DIAGNOSIS — I70213 Atherosclerosis of native arteries of extremities with intermittent claudication, bilateral legs: Secondary | ICD-10-CM

## 2023-02-19 DIAGNOSIS — K219 Gastro-esophageal reflux disease without esophagitis: Secondary | ICD-10-CM | POA: Diagnosis not present

## 2023-02-19 DIAGNOSIS — I10 Essential (primary) hypertension: Secondary | ICD-10-CM

## 2023-02-19 DIAGNOSIS — E782 Mixed hyperlipidemia: Secondary | ICD-10-CM | POA: Diagnosis not present

## 2023-02-19 DIAGNOSIS — E785 Hyperlipidemia, unspecified: Secondary | ICD-10-CM | POA: Insufficient documentation

## 2023-02-19 LAB — VAS US ABI WITH/WO TBI
Left ABI: 1.06
Right ABI: 1.09

## 2023-02-19 NOTE — Progress Notes (Signed)
MRN : [id number]  Cynthia Sims is a 72 y.o. (03-07-1951) female who presents with chief complaint of check circulation.  History of Present Illness:   The patient returns to the office for followup and review status post angiogram with intervention on 10/31/2022.   Procedure: Percutaneous transluminal angioplasty and stent placement left superficial femoral artery and popliteal   status post angiogram with intervention on 10/24/2022:  Procedure: Percutaneous transluminal angioplasty and stent placement left superficial femoral artery and popliteal   The patient notes improvement in the lower extremity symptoms. No interval shortening of the patient's claudication distance or rest pain symptoms. No new ulcers or wounds have occurred since the last visit.  There have been no significant changes to the patient's overall health care.  No documented history of amaurosis fugax or recent TIA symptoms. There are no recent neurological changes noted. No documented history of DVT, PE or superficial thrombophlebitis. The patient denies recent episodes of angina or shortness of breath.   ABI's Rt=1.04 and Lt=1.06  (previous ABI's Rt=1.20 and Lt=1.18) Duplex US of the bilateral lower extremity arterial system shows SFA stents are patent bilaterally with uniform velocities throughout the femoral-popliteal arteries.  Current Meds  Medication Sig   acetaminophen (TYLENOL) 500 MG tablet Take 500 mg by mouth every 6 (six) hours as needed.    aspirin EC 81 MG tablet Take 81 mg by mouth daily.    carvedilol (COREG) 25 MG tablet Take 1 tablet by mouth twice daily   cholestyramine (QUESTRAN) 4 g packet DISSOLVE AND TAKE ONE PACKET BY MOUTH THREE TIMES DAILY   clopidogrel (PLAVIX) 75 MG tablet Take 1 tablet (75 mg total) by mouth daily.   cyanocobalamin 1000 MCG tablet Take 1,000 mcg by mouth daily.   DULoxetine (CYMBALTA) 60 MG  capsule Take 1 capsule by mouth once daily   empagliflozin (JARDIANCE) 10 MG TABS tablet Take 1 tablet (10 mg total) by mouth daily before breakfast.   gabapentin (NEURONTIN) 100 MG capsule TAKE 1 CAPSULE BY MOUTH THREE TIMES DAILY   glipiZIDE (GLUCOTROL) 10 MG tablet Take 1 tablet (10 mg total) by mouth 2 (two) times daily.   glucose blood (ONETOUCH ULTRA) test strip 1 each by Other route 3 (three) times daily. Use as instructed   Insulin Glargine (BASAGLAR KWIKPEN) 100 UNIT/ML INJECT 24 UNITS SUBCUTANEOUSLY ONCE DAILY   irbesartan (AVAPRO) 300 MG tablet Take 1 tablet (300 mg total) by mouth daily.   isosorbide mononitrate (IMDUR) 30 MG 24 hr tablet Take 1 tablet (30 mg total) by mouth daily.   Lancets (ONETOUCH DELICA PLUS LANCET33G) MISC USE 1  TO CHECK GLUCOSE ONCE DAILY   lovastatin (MEVACOR) 40 MG tablet Take 1 tablet by mouth once daily   metFORMIN (GLUCOPHAGE) 500 MG tablet Take 1 tablet (500 mg total) by mouth 2 (two) times daily with a meal.   nitroGLYCERIN (NITROSTAT) 0.4 MG SL tablet Place 1 tablet (0.4 mg total) under the tongue every 5 (five) minutes as needed for chest pain.   pantoprazole (PROTONIX) 40 MG tablet Take 1 tablet (40 mg total) by mouth daily.  Past Medical History:  Diagnosis Date   ALT (SGPT) level raised    Anemia    Anxiety    Arthritis    Depression    Diabetes mellitus without complication (HCC)    Elevated cholesterol    GERD (gastroesophageal reflux disease)    Hemorrhoids    Hypertension    Kidney disease     Past Surgical History:  Procedure Laterality Date   abdominal blockage     CESAREAN SECTION  1983   CESAREAN SECTION     CHOLECYSTECTOMY     COLONOSCOPY WITH PROPOFOL N/A 10/29/2015   Procedure: COLONOSCOPY WITH PROPOFOL;  Surgeon: Scot Jun, MD;  Location: [facility] ENDOSCOPY;  Service: Endoscopy;  Laterality: N/A;   COLONOSCOPY WITH PROPOFOL N/A 11/07/2017   Procedure: COLONOSCOPY WITH PROPOFOL;  Surgeon: Toledo, Boykin Nearing, MD;   Location: [facility] ENDOSCOPY;  Service: Gastroenterology;  Laterality: N/A;   DILATION AND CURETTAGE OF UTERUS  2001   ESOPHAGOGASTRODUODENOSCOPY (EGD) WITH PROPOFOL N/A 10/29/2015   Procedure: ESOPHAGOGASTRODUODENOSCOPY (EGD) WITH PROPOFOL;  Surgeon: Scot Jun, MD;  Location: [facility] ENDOSCOPY;  Service: Endoscopy;  Laterality: N/A;   LOWER EXTREMITY ANGIOGRAPHY Right 10/24/2022   Procedure: Lower Extremity Angiography;  Surgeon: Renford Dills, MD;  Location: [facility] INVASIVE CV LAB;  Service: Cardiovascular;  Laterality: Right;   LOWER EXTREMITY ANGIOGRAPHY Left 10/31/2022   Procedure: Lower Extremity Angiography;  Surgeon: Renford Dills, MD;  Location: [facility] INVASIVE CV LAB;  Service: Cardiovascular;  Laterality: Left;    Social History Social History   Tobacco Use   Smoking status: Never    Passive exposure: Never   Smokeless tobacco: Never  Vaping Use   Vaping status: Never Used  Substance Use Topics   Alcohol use: No    Alcohol/week: 0.0 standard drinks of alcohol   Drug use: No    Family History Family History  Problem Relation Age of Onset   Alzheimer's disease Mother    COPD Mother    Breast cancer Mother 45   Osteoporosis Mother    Heart attack Father    Lupus Father    Diabetes Father    Hypertension Father    Diabetes Sister    COPD Maternal Grandmother    Diabetes Paternal Grandfather    Diabetes Sister    Diabetes Sister     No Known Allergies   REVIEW OF SYSTEMS (Negative unless checked)  Constitutional: [] Weight loss  [] Fever  [] Chills Cardiac: [] Chest pain   [] Chest pressure   [] Palpitations   [] Shortness of breath when laying flat   [] Shortness of breath with exertion. Vascular:  [x] Pain in legs with walking   [] Pain in legs at rest  [] History of DVT   [] Phlebitis   [] Swelling in legs   [] Varicose veins   [] Non-healing ulcers Pulmonary:   [] Uses home oxygen   [] Productive cough   [] Hemoptysis   [] Wheeze  [] COPD   [] Asthma Neurologic:   [] Dizziness   [] Seizures   [] History of stroke   [] History of TIA  [] Aphasia   [] Vissual changes   [] Weakness or numbness in arm   [] Weakness or numbness in leg Musculoskeletal:   [] Joint swelling   [] Joint pain   [] Low back pain Hematologic:  [] Easy bruising  [] Easy bleeding   [] Hypercoagulable state   [] Anemic Gastrointestinal:  [] Diarrhea   [] Vomiting  [x] Gastroesophageal reflux/heartburn   [] Difficulty swallowing. Genitourinary:  [] Chronic kidney disease   [] Difficult urination  [] Frequent urination   [] Blood in urine Skin:  [] Rashes   []   Ulcers  Psychological:  [] History of anxiety   []  History of major depression.  Physical Examination  Vitals:   02/19/23 0931  BP: 116/79  Pulse: 65  Resp: 16  Weight: 194 lb 9.6 oz (88.3 kg)   Body mass index is 36.77 kg/m. Gen: WD/WN, NAD Head: [place]/AT, No temporalis wasting.  Ear/Nose/Throat: Hearing grossly intact, nares w/o erythema or drainage Eyes: PER, EOMI, sclera nonicteric.  Neck: Supple, no masses.  No bruit or JVD.  Pulmonary:  Good air movement, no audible wheezing, no use of accessory muscles.  Cardiac: RRR, normal S1, S2, no Murmurs. Vascular:  mild trophic changes, no open wounds Vessel Right Left  Radial Palpable Palpable  PT Palpable Palpable  DP Not Palpable Not Palpable  Gastrointestinal: soft, non-distended. No guarding/no peritoneal signs.  Musculoskeletal: M/S 5/5 throughout.  No visible deformity.  Neurologic: CN 2-12 intact. Pain and light touch intact in extremities.  Symmetrical.  Speech is fluent. Motor exam as listed above. Psychiatric: Judgment intact, Mood & affect appropriate for pt's clinical situation. Dermatologic: No rashes or ulcers noted.  No changes consistent with cellulitis.   CBC Lab Results  Component Value Date   WBC 5.2 12/25/2022   HGB 9.8 (L) 12/25/2022   HCT 30.9 (L) 12/25/2022   MCV 90 12/25/2022   PLT 145 (L) 12/25/2022    BMET    Component Value Date/Time   NA 141 01/30/2023 1156    NA 135 (L) 03/01/2012 2236   K 4.1 01/30/2023 1156   K 4.4 03/01/2012 2236   CL 109 (H) 01/30/2023 1156   CL 103 03/01/2012 2236   CO2 17 (L) 01/30/2023 1156   CO2 18 (L) 03/01/2012 2236   GLUCOSE 73 01/30/2023 1156   GLUCOSE 82 07/20/2022 1126   GLUCOSE 494 (H) 03/01/2012 2236   BUN 21 01/30/2023 1156   BUN 23 (H) 03/01/2012 2236   CREATININE 1.70 (H) 01/30/2023 1156   CREATININE 1.72 (H) 03/01/2012 2236   CALCIUM 8.1 (L) 01/30/2023 1156   CALCIUM 8.3 (L) 03/01/2012 2236   GFRNONAA 33 (L) 10/31/2022 0748   GFRNONAA 31 (L) 03/01/2012 2236   GFRAA 41 (L) 10/06/2019 1150   GFRAA 36 (L) 03/01/2012 2236   Estimated Creatinine Clearance: 29.3 mL/min (A) (by C-G formula based on SCr of 1.7 mg/dL (H)).  COAG No results found for: "INR", "PROTIME"  Radiology No results found.   Assessment/Plan 1. Atherosclerosis of native artery of both lower extremities with intermittent claudication (HCC) Recommend:  The patient is status post successful angiogram with intervention.  The patient reports that the claudication symptoms and leg pain has improved.   The patient denies lifestyle limiting changes at this point in time.  No further invasive studies, angiography or surgery at this time The patient should continue walking and begin a more formal exercise program.  The patient should continue antiplatelet therapy and aggressive treatment of the lipid abnormalities  Continued surveillance is indicated as atherosclerosis is likely to progress with time.    Patient should undergo noninvasive studies as ordered. The patient will follow up with me to review the studies.  - VAS [place] ABI WITH/WO TBI; Future - VAS [place] LOWER EXTREMITY ARTERIAL DUPLEX; Future  2. Essential hypertension Continue antihypertensive medications as already ordered, these medications have been reviewed and there are no changes at this time.  3. Gastroesophageal reflux disease, unspecified whether esophagitis  present Continue PPI as already ordered, this medication has been reviewed and there are no changes at this  time.  Avoidence of caffeine and alcohol  Moderate elevation of the head of the bed   4. Diabetes mellitus due to underlying condition with diabetic polyneuropathy, without long-term current use of insulin (HCC) Continue hypoglycemic medications as already ordered, these medications have been reviewed and there are no changes at this time.  Hgb A1C to be monitored as already arranged by primary service  5. Mixed hyperlipidemia Continue statin as ordered and reviewed, no changes at this time    [name], MD  02/19/2023 9:37 AM

## 2023-02-22 ENCOUNTER — Ambulatory Visit: Payer: Medicare HMO | Attending: Cardiology | Admitting: Pharmacist

## 2023-02-22 VITALS — Wt 194.0 lb

## 2023-02-22 DIAGNOSIS — E1122 Type 2 diabetes mellitus with diabetic chronic kidney disease: Secondary | ICD-10-CM

## 2023-02-22 DIAGNOSIS — Z7984 Long term (current) use of oral hypoglycemic drugs: Secondary | ICD-10-CM

## 2023-02-22 DIAGNOSIS — E0842 Diabetes mellitus due to underlying condition with diabetic polyneuropathy: Secondary | ICD-10-CM

## 2023-02-22 DIAGNOSIS — N183 Chronic kidney disease, stage 3 unspecified: Secondary | ICD-10-CM | POA: Diagnosis not present

## 2023-02-22 NOTE — Patient Instructions (Addendum)
Decrease glipizide to 5mg  (1/2 tablet) twice a day Please call me if your blood sugar drops to 80 or less Please call me [phone] with any questions  GLP-1 Receptor Agonist Counseling Points This medication reduces your appetite and may make you feel fuller longer.  Stop eating when your body tells you that you are full. This will likely happen sooner than you are used to. Fried/greasy food and sweets may upset your stomach - minimize these as much as possible. Store your medication in the fridge until you are ready to use it. Inject your medication in the fatty tissue of your lower abdominal area (2 inches away from belly button) or upper outer thigh. Rotate injection sites. Common side effects include: nausea, diarrhea/constipation, and heartburn, and are more likely to occur if you overeat. Stop your injection for 7 days prior to surgical procedures requiring anesthesia.  Dosing schedule:  We will touch base with you monthly over the phone. The medication can be increased in monthly intervals depending on tolerability and efficacy.  Tips for success: Write down the reasons why you want to lose weight and post it in a place where you'll see it often.  Start small and work your way up. Keep in mind that it takes time to achieve goals, and small steps add up.  Any additional movements help to burn calories. Taking the stairs rather than the elevator and parking at the far end of your parking lot are easy ways to start. Brisk walking for at least 30 minutes 4 or more days of the week is an excellent goal to work toward  Understanding what it means to feel full: Did you know that it can take 15 minutes or more for your brain to receive the message that you've eaten? That means that, if you eat less food, but consume it slower, you may still feel satisfied.  Eating a lot of fruits and vegetables can also help you feel fuller.  Eat off of smaller plates so that moderate portions don't seem  too small  Tips for living a healthier life     Building a Healthy and Balanced Diet Make most of your meal vegetables and fruits -  of your plate. Aim for color and variety, and remember that potatoes don't count as vegetables on the Healthy Eating Plate because of their negative impact on blood sugar.  Go for whole grains -  of your plate. Whole and intact grains--whole wheat, barley, wheat berries, quinoa, oats, brown rice, and foods made with them, such as whole wheat pasta--have a milder effect on blood sugar and insulin than white bread, white rice, and other refined grains.  Protein power -  of your plate. Fish, poultry, beans, and nuts are all healthy, versatile protein sources--they can be mixed into salads, and pair well with vegetables on a plate. Limit red meat, and avoid processed meats such as bacon and sausage.  Healthy plant oils - in moderation. Choose healthy vegetable oils like olive, canola, soy, corn, sunflower, peanut, and others, and avoid partially hydrogenated oils, which contain unhealthy trans fats. Remember that low-fat does not mean "healthy."  Drink water, coffee, or tea. Skip sugary drinks, limit milk and dairy products to one to two servings per day, and limit juice to a small glass per day.  Stay active. The red figure running across the Healthy Eating Plate's placemat is a reminder that staying active is also important in weight control.  The main message of the Healthy Eating  Plate is to focus on diet quality:  The type of carbohydrate in the diet is more important than the amount of carbohydrate in the diet, because some sources of carbohydrate--like vegetables (other than potatoes), fruits, whole grains, and beans--are healthier than others. The Healthy Eating Plate also advises consumers to avoid sugary beverages, a major source of calories--usually with little nutritional value--in the American diet. The Healthy Eating Plate encourages consumers  to use healthy oils, and it does not set a maximum on the percentage of calories people should get each day from healthy sources of fat. In this way, the Healthy Eating Plate recommends the opposite of the low-fat message promoted for decades by the USDA.  [employer]  SUGAR  Sugar is a huge problem in the modern day diet. Sugar is a big contributor to heart disease, diabetes, high triglyceride levels, fatty liver disease and obesity. Sugar is hidden in almost all packaged foods/beverages. Added sugar is extra sugar that is added beyond what is naturally found and has no nutritional benefit for your body. The American Heart Association recommends limiting added sugars to no more than 25g for women and 36 grams for men per day. There are many names for sugar including maltose, sucrose (names ending in "ose"), high fructose corn syrup, molasses, cane sugar, corn sweetener, raw sugar, syrup, honey or fruit juice concentrate.   One of the best ways to limit your added sugars is to stop drinking sweetened beverages such as soda, sweet tea, and fruit juice.  There is 65g of added sugars in one 20oz bottle of Coke! That is equal to 7.5 donuts.   Pay attention and read all nutrition facts labels. Below is an examples of a nutrition facts label. The #1 is showing you the total sugars where the # 2 is showing you the added sugars. This one serving has almost the max amount of added sugars per day!   EXERCISE  Exercise is good. We've all heard that. In an ideal world, we would all have time and resources to get plenty of it. When you are active, your heart pumps more efficiently and you will feel better.  Multiple studies show that even walking regularly has benefits that include living a longer life. The American Heart Association recommends 150 minutes per week of exercise (30 minutes per day most days of the week). You can do this in any increment you  wish. Nine or more 10-minute walks count. So does an hour-long exercise class. Break the time apart into what will work in your life. Some of the best things you can do include walking briskly, jogging, cycling or swimming laps. Not everyone is ready to "exercise." Sometimes we need to start with just getting active. Here are some easy ways to be more active throughout the day:  Take the stairs instead of the elevator  Go for a 10-15 minute walk during your lunch break (find a friend to make it more enjoyable)  When shopping, park at the back of the parking lot  If you take public transportation, get off one stop early and walk the extra distance  Pace around while making phone calls  Check with your doctor if you aren't sure what your limitations may be. Always remember to drink plenty of water when doing any type of exercise. Don't feel like a failure if you're not getting the 90-150 minutes per week. If you started by being a couch potato, then just a 10-minute walk each day is a huge  improvement. Start with little victories and work your way up.   HEALTHY EATING TIPS              Plan ahead: make a menu of the meals for a week then create a grocery list to go with that menu. Consider meals that easily stretch into a night of leftovers, such as stews or casseroles. Or consider making two of your favorite meal and put one in the freezer for another night. Try a night or two each week that is "meatless" or "no cook" such as salads. When you get home from the grocery store wash and prepare your vegetables and fruits. Then when you need them they are ready to go.   Tips for going to the grocery store:  Buy store or generic brands  Check the weekly ad from your store on-line or in their in-store flyer  Look at the unit price on the shelf tag to compare/contrast the costs of different items  Buy fruits/vegetables in season  Carrots, bananas and apples are low-cost, naturally healthy items  If meats  or frozen vegetables are on sale, buy some extras and put in your freezer  Limit buying prepared or "ready to eat" items, even if they are pre-made salads or fruit snacks  Do not shop when you're hungry  Foods at eye level tend to be more expensive. Look on the high and low shelves for deals.  Consider shopping at the farmer's market for fresh foods in season.  Avoid the cookie and chip aisles (these are expensive, high in calories and low in nutritional value). Shop on the outside of the grocery store.  Healthy food preparations:  If you can't get lean hamburger, be sure to drain the fat when cooking  Steam, saut (in olive oil), grill or bake foods  Experiment with different seasonings to avoid adding salt to your foods. Kosher salt, sea salt and Himalayan salt are all still salt and should be avoided. Try seasoning food with onion, garlic, thyme, rosemary, basil ect. Onion powder or garlic powder is ok. Avoid if it says salt (ie garlic salt).

## 2023-02-22 NOTE — Progress Notes (Signed)
Patient ID: Cynthia Sims                 DOB: March 05, 1951                    MRN: [id number]     HPI: Cynthia Sims is a 72 y.o. female patient referred to pharmacy clinic by Dr Izora Ribas to initiate GLP1-RA therapy. PMH is significant for HTN, PAD, DM2, CKD IIIa, HLD, OSA, aortic atherosclerosis, and obesity.   A1c most recently 7.4% on 12/25/22. For glucose control, pt is on basaglar insulin, metformin, glipizide, and was recently started on Jardiance a few weeks ago.  Patient presents today accompanied by her daughter. She is on a fixed income. At first patient denied having any extra help with medications but then state she doesn't pay anymore than $11.20 for her medications. She has PAD and stents in her legs. Did get on the stationary bike the other day to see how she did. Active around the house but no formal exercise. AM blood sugars about 109. Sometimes checks right after eating and BG will be 200's but doesn't wait 2hr.  States she doesn't eat much. Doesn't have much appetite. Drinks pretty much only water.   Diet:  Breakfast: boiled eggs, cereal Lunch: soup and grilled cheese sandwich Dinner: hamburger, chicken, pinto beans and cornbread Drink: water, soda once every few months   Exercise: some walking, house work, does have stationary bike  Family History: Mother with breast cancer, osteoporosis, COPD, and Alzheimer's. Father with MI, DM, HTN, and lupus. Sister with DM.   Social History: No tobacco, alcohol or drug use.  Labs: Lab Results  Component Value Date   HGBA1C 7.4 (H) 12/25/2022    Wt Readings from Last 1 Encounters:  02/19/23 194 lb 9.6 oz (88.3 kg)    BP Readings from Last 1 Encounters:  02/19/23 116/79   Pulse Readings from Last 1 Encounters:  02/19/23 65       Component Value Date/Time   CHOL 131 12/25/2022 0925   TRIG 168 (H) 12/25/2022 0925   HDL 58 12/25/2022 0925   CHOLHDL 2.3 12/25/2022 0925   LDLCALC 45 12/25/2022 0925    Past  Medical History:  Diagnosis Date   ALT (SGPT) level raised    Anemia    Anxiety    Arthritis    Depression    Diabetes mellitus without complication (HCC)    Elevated cholesterol    GERD (gastroesophageal reflux disease)    Hemorrhoids    Hypertension    Kidney disease     Current Outpatient Medications on File Prior to Visit  Medication Sig Dispense Refill   acetaminophen (TYLENOL) 500 MG tablet Take 500 mg by mouth every 6 (six) hours as needed.      aspirin EC 81 MG tablet Take 81 mg by mouth daily.      carvedilol (COREG) 25 MG tablet Take 1 tablet by mouth twice daily 180 tablet 2   cholestyramine (QUESTRAN) 4 g packet DISSOLVE AND TAKE ONE PACKET BY MOUTH THREE TIMES DAILY 270 each 1   clopidogrel (PLAVIX) 75 MG tablet Take 1 tablet (75 mg total) by mouth daily. 30 tablet 5   cyanocobalamin 1000 MCG tablet Take 1,000 mcg by mouth daily.     DULoxetine (CYMBALTA) 60 MG capsule Take 1 capsule by mouth once daily 90 capsule 0   empagliflozin (JARDIANCE) 10 MG TABS tablet Take 1 tablet (10 mg total) by mouth daily before  breakfast.     gabapentin (NEURONTIN) 100 MG capsule TAKE 1 CAPSULE BY MOUTH THREE TIMES DAILY 270 capsule 1   glipiZIDE (GLUCOTROL) 10 MG tablet Take 1 tablet (10 mg total) by mouth 2 (two) times daily. 180 tablet 1   glucose blood (ONETOUCH ULTRA) test strip 1 each by Other route 3 (three) times daily. Use as instructed 100 each 2   Insulin Glargine (BASAGLAR KWIKPEN) 100 UNIT/ML INJECT 24 UNITS SUBCUTANEOUSLY ONCE DAILY 15 mL 0   irbesartan (AVAPRO) 300 MG tablet Take 1 tablet (300 mg total) by mouth daily. 90 tablet 3   isosorbide mononitrate (IMDUR) 30 MG 24 hr tablet Take 1 tablet (30 mg total) by mouth daily. 90 tablet 3   Lancets (ONETOUCH DELICA PLUS LANCET33G) MISC USE 1  TO CHECK GLUCOSE ONCE DAILY 100 each 3   lovastatin (MEVACOR) 40 MG tablet Take 1 tablet by mouth once daily 90 tablet 1   metFORMIN (GLUCOPHAGE) 500 MG tablet Take 1 tablet (500 mg  total) by mouth 2 (two) times daily with a meal. 180 tablet 1   nitroGLYCERIN (NITROSTAT) 0.4 MG SL tablet Place 1 tablet (0.4 mg total) under the tongue every 5 (five) minutes as needed for chest pain. 25 tablet 3   pantoprazole (PROTONIX) 40 MG tablet Take 1 tablet (40 mg total) by mouth daily. 30 tablet 11   No current facility-administered medications on file prior to visit.    No Known Allergies   Assessment/Plan:  1. Weight loss - Patient has not met goal of at least 5% of body weight loss with comprehensive lifestyle modifications alone in the past 3-6 months. Pharmacotherapy is appropriate to pursue as augmentation. Will start Mounjaro 2.5mg  daily. Confirmed patient not pregnant and no personal or family history of medullary thyroid carcinoma (MTC) or Multiple Endocrine Neoplasia syndrome type 2 (MEN 2). Injection technique reviewed at today's visit. Has had gallstones but gallbladder removed. No pancreatitis. Aware of increased risk of this.   Advised patient on common side effects including nausea, diarrhea, dyspepsia, decreased appetite, and fatigue. Counseled patient on reducing meal size and how to titrate medication to minimize side effects. Counseled patient to call if intolerable side effects or if experiencing dehydration, abdominal pain, or dizziness. Encouraged patient to increase walking or riding of bike. Try for 5 min at a time. Then increase time or amount of times per day. Also encouraged strength training. Squats to chair holding on to counter. Talked about benefits of having muscle mass and functional fitness.  Will submit PA for [facility]. Decrease glipizide to 5mg  (1/2 tablet) twice a day Please call me if your blood sugar drops to 80 or less  Follow up in 1 month via telephone for tolerability update and dose titration.  Thank you,  Olene Floss, Pharm.D, BCACP, BCPS, CPP [facility] [address],  [place], [place] [address]  Phone: [phone]; Fax: [phone]

## 2023-02-23 ENCOUNTER — Other Ambulatory Visit (HOSPITAL_COMMUNITY): Payer: Self-pay

## 2023-02-23 ENCOUNTER — Telehealth: Payer: Self-pay | Admitting: Pharmacy Technician

## 2023-02-23 MED ORDER — MOUNJARO 2.5 MG/0.5ML ~~LOC~~ SOAJ: 2.5000 mg | SUBCUTANEOUS | 0 refills | Status: DC

## 2023-02-23 NOTE — Telephone Encounter (Signed)
-----   Message from Olene Floss sent at 02/22/2023  4:30 PM EDT ----- Please submit PA for [facility]. Thanks! Diabetes- A1C 7.4

## 2023-02-23 NOTE — Telephone Encounter (Signed)
Pharmacy Patient Advocate Encounter  Insurance verification completed.    The patient is insured through  [employer] claim for [employer]. Currently a quantity of 2 mL is a 28 day supply and the co-pay is $11.20 .   This test claim was processed through [facility]- copay amounts may vary at other pharmacies due to pharmacy/plan contracts, or as the patient moves through the different stages of their insurance plan.

## 2023-02-23 NOTE — Telephone Encounter (Signed)
Patient made aware that med is covered. Rx sent to [facility]. Patient reminded to decrease glipizide to 1/2 tablet twice a day. I will call pt in 3-4 week to f/u

## 2023-02-23 NOTE — Addendum Note (Signed)
Addended by: Malena Peer D on: 02/23/2023 01:33 PM   Modules accepted: Orders

## 2023-02-26 ENCOUNTER — Inpatient Hospital Stay
Admission: EM | Admit: 2023-02-26 | Discharge: 2023-03-01 | DRG: 683 | Disposition: A | Discharge status: Home Health | Payer: Medicare HMO | Attending: Osteopathic Medicine | Admitting: Osteopathic Medicine

## 2023-02-26 ENCOUNTER — Other Ambulatory Visit: Payer: Self-pay

## 2023-02-26 DIAGNOSIS — Z8269 Family history of other diseases of the musculoskeletal system and connective tissue: Secondary | ICD-10-CM

## 2023-02-26 DIAGNOSIS — Z7984 Long term (current) use of oral hypoglycemic drugs: Secondary | ICD-10-CM

## 2023-02-26 DIAGNOSIS — E872 Acidosis, unspecified: Secondary | ICD-10-CM | POA: Diagnosis not present

## 2023-02-26 DIAGNOSIS — G4730 Sleep apnea, unspecified: Secondary | ICD-10-CM | POA: Diagnosis present

## 2023-02-26 DIAGNOSIS — E119 Type 2 diabetes mellitus without complications: Secondary | ICD-10-CM

## 2023-02-26 DIAGNOSIS — Z825 Family history of asthma and other chronic lower respiratory diseases: Secondary | ICD-10-CM

## 2023-02-26 DIAGNOSIS — E1151 Type 2 diabetes mellitus with diabetic peripheral angiopathy without gangrene: Secondary | ICD-10-CM | POA: Diagnosis present

## 2023-02-26 DIAGNOSIS — E11649 Type 2 diabetes mellitus with hypoglycemia without coma: Secondary | ICD-10-CM | POA: Diagnosis present

## 2023-02-26 DIAGNOSIS — E162 Hypoglycemia, unspecified: Principal | ICD-10-CM

## 2023-02-26 DIAGNOSIS — R339 Retention of urine, unspecified: Secondary | ICD-10-CM | POA: Insufficient documentation

## 2023-02-26 DIAGNOSIS — I2510 Atherosclerotic heart disease of native coronary artery without angina pectoris: Secondary | ICD-10-CM | POA: Diagnosis present

## 2023-02-26 DIAGNOSIS — M1990 Unspecified osteoarthritis, unspecified site: Secondary | ICD-10-CM | POA: Diagnosis present

## 2023-02-26 DIAGNOSIS — E669 Obesity, unspecified: Secondary | ICD-10-CM | POA: Diagnosis present

## 2023-02-26 DIAGNOSIS — I10 Essential (primary) hypertension: Secondary | ICD-10-CM | POA: Diagnosis not present

## 2023-02-26 DIAGNOSIS — D631 Anemia in chronic kidney disease: Secondary | ICD-10-CM | POA: Diagnosis present

## 2023-02-26 DIAGNOSIS — Z794 Long term (current) use of insulin: Secondary | ICD-10-CM

## 2023-02-26 DIAGNOSIS — G3184 Mild cognitive impairment, so stated: Secondary | ICD-10-CM | POA: Diagnosis present

## 2023-02-26 DIAGNOSIS — E0842 Diabetes mellitus due to underlying condition with diabetic polyneuropathy: Secondary | ICD-10-CM

## 2023-02-26 DIAGNOSIS — Z833 Family history of diabetes mellitus: Secondary | ICD-10-CM

## 2023-02-26 DIAGNOSIS — E7800 Pure hypercholesterolemia, unspecified: Secondary | ICD-10-CM | POA: Diagnosis present

## 2023-02-26 DIAGNOSIS — D696 Thrombocytopenia, unspecified: Secondary | ICD-10-CM | POA: Diagnosis present

## 2023-02-26 DIAGNOSIS — Z9049 Acquired absence of other specified parts of digestive tract: Secondary | ICD-10-CM

## 2023-02-26 DIAGNOSIS — Z6838 Body mass index (BMI) 38.0-38.9, adult: Secondary | ICD-10-CM

## 2023-02-26 DIAGNOSIS — E1165 Type 2 diabetes mellitus with hyperglycemia: Secondary | ICD-10-CM | POA: Diagnosis present

## 2023-02-26 DIAGNOSIS — K219 Gastro-esophageal reflux disease without esophagitis: Secondary | ICD-10-CM | POA: Diagnosis present

## 2023-02-26 DIAGNOSIS — D649 Anemia, unspecified: Secondary | ICD-10-CM | POA: Diagnosis present

## 2023-02-26 DIAGNOSIS — I739 Peripheral vascular disease, unspecified: Secondary | ICD-10-CM | POA: Diagnosis present

## 2023-02-26 DIAGNOSIS — Z8249 Family history of ischemic heart disease and other diseases of the circulatory system: Secondary | ICD-10-CM

## 2023-02-26 DIAGNOSIS — N1832 Chronic kidney disease, stage 3b: Secondary | ICD-10-CM | POA: Diagnosis present

## 2023-02-26 DIAGNOSIS — Z8262 Family history of osteoporosis: Secondary | ICD-10-CM

## 2023-02-26 DIAGNOSIS — Z79899 Other long term (current) drug therapy: Secondary | ICD-10-CM

## 2023-02-26 DIAGNOSIS — Z7985 Long-term (current) use of injectable non-insulin antidiabetic drugs: Secondary | ICD-10-CM

## 2023-02-26 DIAGNOSIS — G4733 Obstructive sleep apnea (adult) (pediatric): Secondary | ICD-10-CM | POA: Diagnosis present

## 2023-02-26 DIAGNOSIS — I129 Hypertensive chronic kidney disease with stage 1 through stage 4 chronic kidney disease, or unspecified chronic kidney disease: Secondary | ICD-10-CM | POA: Diagnosis present

## 2023-02-26 DIAGNOSIS — E1169 Type 2 diabetes mellitus with other specified complication: Secondary | ICD-10-CM | POA: Diagnosis present

## 2023-02-26 DIAGNOSIS — F09 Unspecified mental disorder due to known physiological condition: Secondary | ICD-10-CM

## 2023-02-26 DIAGNOSIS — N390 Urinary tract infection, site not specified: Secondary | ICD-10-CM | POA: Diagnosis present

## 2023-02-26 DIAGNOSIS — Z7982 Long term (current) use of aspirin: Secondary | ICD-10-CM

## 2023-02-26 DIAGNOSIS — Z7902 Long term (current) use of antithrombotics/antiplatelets: Secondary | ICD-10-CM

## 2023-02-26 DIAGNOSIS — T383X5A Adverse effect of insulin and oral hypoglycemic [antidiabetic] drugs, initial encounter: Secondary | ICD-10-CM | POA: Diagnosis present

## 2023-02-26 DIAGNOSIS — Z820 Family history of epilepsy and other diseases of the nervous system: Secondary | ICD-10-CM

## 2023-02-26 DIAGNOSIS — N179 Acute kidney failure, unspecified: Secondary | ICD-10-CM | POA: Diagnosis not present

## 2023-02-26 DIAGNOSIS — E1122 Type 2 diabetes mellitus with diabetic chronic kidney disease: Secondary | ICD-10-CM | POA: Diagnosis present

## 2023-02-26 DIAGNOSIS — Z803 Family history of malignant neoplasm of breast: Secondary | ICD-10-CM

## 2023-02-26 DIAGNOSIS — N183 Chronic kidney disease, stage 3 unspecified: Secondary | ICD-10-CM | POA: Diagnosis present

## 2023-02-26 DIAGNOSIS — E1140 Type 2 diabetes mellitus with diabetic neuropathy, unspecified: Secondary | ICD-10-CM | POA: Diagnosis present

## 2023-02-26 LAB — COMPREHENSIVE METABOLIC PANEL
ALT: 10 U/L (ref 0–44)
AST: 14 U/L — ABNORMAL LOW (ref 15–41)
Albumin: 3.6 g/dL (ref 3.5–5.0)
Alkaline Phosphatase: 78 U/L (ref 38–126)
Anion gap: 9 (ref 5–15)
BUN: 43 mg/dL — ABNORMAL HIGH (ref 8–23)
CO2: 13 mmol/L — ABNORMAL LOW (ref 22–32)
Calcium: 8.2 mg/dL — ABNORMAL LOW (ref 8.9–10.3)
Chloride: 111 mmol/L (ref 98–111)
Creatinine, Ser: 2.68 mg/dL — ABNORMAL HIGH (ref 0.44–1.00)
GFR, Estimated: 18 mL/min — ABNORMAL LOW (ref >60)
Glucose, Bld: 370 mg/dL — ABNORMAL HIGH (ref 70–99)
Potassium: 5.8 mmol/L — ABNORMAL HIGH (ref 3.5–5.1)
Sodium: 133 mmol/L — ABNORMAL LOW (ref 135–145)
Total Bilirubin: 0.5 mg/dL (ref 0.3–1.2)
Total Protein: 6.8 g/dL (ref 6.5–8.1)

## 2023-02-26 LAB — CBC WITH DIFFERENTIAL/PLATELET
Abs Immature Granulocytes: 0.02 10*3/uL (ref 0.00–0.07)
Basophils Absolute: 0 10*3/uL (ref 0.0–0.1)
Basophils Relative: 0 %
Eosinophils Absolute: 0 10*3/uL (ref 0.0–0.5)
Eosinophils Relative: 0 %
HCT: 34.7 % — ABNORMAL LOW (ref 36.0–46.0)
Hemoglobin: 10.8 g/dL — ABNORMAL LOW (ref 12.0–15.0)
Immature Granulocytes: 0 %
Lymphocytes Relative: 13 %
Lymphs Abs: 1.3 10*3/uL (ref 0.7–4.0)
MCH: 28.8 pg (ref 26.0–34.0)
MCHC: 31.1 g/dL (ref 30.0–36.0)
MCV: 92.5 fL (ref 80.0–100.0)
Monocytes Absolute: 0.5 10*3/uL (ref 0.1–1.0)
Monocytes Relative: 5 %
Neutro Abs: 7.9 10*3/uL — ABNORMAL HIGH (ref 1.7–7.7)
Neutrophils Relative %: 82 %
Platelets: 141 10*3/uL — ABNORMAL LOW (ref 150–400)
RBC: 3.75 MIL/uL — ABNORMAL LOW (ref 3.87–5.11)
RDW: 15.4 % (ref 11.5–15.5)
WBC: 9.8 10*3/uL (ref 4.0–10.5)
nRBC: 0 % (ref 0.0–0.2)

## 2023-02-26 LAB — CBG MONITORING, ED
Glucose-Capillary: 27 mg/dL — CL (ref 70–99)
Glucose-Capillary: 103 mg/dL — ABNORMAL HIGH (ref 70–99)
Glucose-Capillary: 149 mg/dL — ABNORMAL HIGH (ref 70–99)

## 2023-02-26 MED ORDER — SODIUM CHLORIDE 0.9 % IV BOLUS (SEPSIS)
1000.0000 mL | Freq: Once | INTRAVENOUS | Status: AC
  Administered 2023-02-27: 1000 mL via INTRAVENOUS

## 2023-02-26 MED ORDER — DEXTROSE 50 % IV SOLN
1.0000 | Freq: Once | INTRAVENOUS | Status: AC
  Administered 2023-02-26: 50 mL via INTRAVENOUS

## 2023-02-26 NOTE — Assessment & Plan Note (Signed)
Continue asa 81 / mevacor  40 mg.

## 2023-02-26 NOTE — Assessment & Plan Note (Signed)
Coreg from tomorrow / cont imdur.

## 2023-02-26 NOTE — ED Notes (Signed)
BG now wnl; sandwich and more crackers provided. GCS still 15 and pt has no complaints.

## 2023-02-26 NOTE — Assessment & Plan Note (Signed)
Marland Kitchen  CPAP for OSA

## 2023-02-26 NOTE — H&P (Incomplete)
History and Physical    [name] DOB: [birth year] DOA: 02/26/2023 DOS: the patient was seen and examined on 02/27/2023 PCP: [name], MD  Patient coming from: Home   Chief Complaint:  Chief Complaint  Patient presents with   Hypoglycemia   HPI: Cynthia Sims is a 72 y.o. female with medical history significant for Hypoglycemia.  Pt has been weak and has had glucose in the 30's at home.  Per EDMD report patient was started on [facility] and was told to decrease her dose of glipizide which she did.Marland Kitchen  POCT glucose at 26.Pt was given juices  and graham crackers. Blood work shows sodium 133 potassium 5.8 AKI of 2.68 and anemia with hb of 10.8, and thrombocytopenia of 141.  Initial EKG is sinus rhythm at 73 with normal intervals and no ST-T wave changes.  Review of Systems: Review of Systems  Constitutional:  Positive for malaise/fatigue.  Neurological:  Positive for weakness.  Endo/Heme/Allergies:        Hypoglycemia.   All other systems reviewed and are negative.  Past Medical History:  Diagnosis Date   ALT (SGPT) level raised    Anemia    Anxiety    Arthritis    Depression    Diabetes mellitus without complication (HCC)    Elevated cholesterol    GERD (gastroesophageal reflux disease)    Hemorrhoids    Hypertension    Kidney disease    Past Surgical History:  Procedure Laterality Date   abdominal blockage     CESAREAN SECTION  1983   CESAREAN SECTION     CHOLECYSTECTOMY     COLONOSCOPY WITH PROPOFOL N/A 10/29/2015   Procedure: COLONOSCOPY WITH PROPOFOL;  Surgeon: Scot Jun, MD;  Location: [facility] ENDOSCOPY;  Service: Endoscopy;  Laterality: N/A;   COLONOSCOPY WITH PROPOFOL N/A 11/07/2017   Procedure: COLONOSCOPY WITH PROPOFOL;  Surgeon: Toledo, Boykin Nearing, MD;  Location: [facility] ENDOSCOPY;  Service: Gastroenterology;  Laterality: N/A;   DILATION AND CURETTAGE OF UTERUS  2001   ESOPHAGOGASTRODUODENOSCOPY (EGD) WITH PROPOFOL N/A  10/29/2015   Procedure: ESOPHAGOGASTRODUODENOSCOPY (EGD) WITH PROPOFOL;  Surgeon: Scot Jun, MD;  Location: [facility] ENDOSCOPY;  Service: Endoscopy;  Laterality: N/A;   LOWER EXTREMITY ANGIOGRAPHY Right 10/24/2022   Procedure: Lower Extremity Angiography;  Surgeon: Renford Dills, MD;  Location: [facility] INVASIVE CV LAB;  Service: Cardiovascular;  Laterality: Right;   LOWER EXTREMITY ANGIOGRAPHY Left 10/31/2022   Procedure: Lower Extremity Angiography;  Surgeon: Renford Dills, MD;  Location: [facility] INVASIVE CV LAB;  Service: Cardiovascular;  Laterality: Left;   Social History:   reports that she has never smoked. She has never been exposed to tobacco smoke. She has never used smokeless tobacco. She reports that she does not drink alcohol and does not use drugs.  No Known Allergies  Family History  Problem Relation Age of Onset   Alzheimer's disease Mother    COPD Mother    Breast cancer Mother 27   Osteoporosis Mother    Heart attack Father    Lupus Father    Diabetes Father    Hypertension Father    Diabetes Sister    COPD Maternal Grandmother    Diabetes Paternal Grandfather    Diabetes Sister    Diabetes Sister     Prior to Admission medications   Medication Sig Start Date End Date Taking? Authorizing Provider  acetaminophen (TYLENOL) 500 MG tablet Take 500 mg by mouth every 6 (six) hours as needed.  [provider]  aspirin EC 81 MG tablet Take 81 mg by mouth daily.     [provider]  carvedilol (COREG) 25 MG tablet Take 1 tablet by mouth twice daily 01/05/23   Mealor, Roberts Gaudy, MD  cholestyramine (QUESTRAN) 4 g packet DISSOLVE AND TAKE ONE PACKET BY MOUTH THREE TIMES DAILY 09/13/22   Simmons-Robinson, Tawanna Cooler, MD  clopidogrel (PLAVIX) 75 MG tablet Take 1 tablet (75 mg total) by mouth daily. 10/25/22   Schnier, Latina Craver, MD  cyanocobalamin 1000 MCG tablet Take 1,000 mcg by mouth daily.    [provider]  DULoxetine (CYMBALTA) 60 MG capsule  Take 1 capsule by mouth once daily 02/15/23   Simmons-Robinson, Tawanna Cooler, MD  empagliflozin (JARDIANCE) 10 MG TABS tablet Take 1 tablet (10 mg total) by mouth daily before breakfast. 01/31/23   Chandrasekhar, Mahesh A, MD  gabapentin (NEURONTIN) 100 MG capsule TAKE 1 CAPSULE BY MOUTH THREE TIMES DAILY 01/19/23   Simmons-Robinson, Tawanna Cooler, MD  glipiZIDE (GLUCOTROL) 10 MG tablet Take 0.5 tablets (5 mg total) by mouth 2 (two) times daily. 02/22/23   Maccia, Efraim Kaufmann D, RPH-CPP  glucose blood (ONETOUCH ULTRA) test strip 1 each by Other route 3 (three) times daily. Use as instructed 07/26/22   Simmons-Robinson, Tawanna Cooler, MD  Insulin Glargine (BASAGLAR KWIKPEN) 100 UNIT/ML INJECT 24 UNITS SUBCUTANEOUSLY ONCE DAILY 02/19/23   Simmons-Robinson, Tawanna Cooler, MD  irbesartan (AVAPRO) 300 MG tablet Take 1 tablet (300 mg total) by mouth daily. 07/13/22   Christell Constant, MD  isosorbide mononitrate (IMDUR) 30 MG 24 hr tablet Take 1 tablet (30 mg total) by mouth daily. 09/20/22   Sharlene Dory, PA-C  Lancets (ONETOUCH DELICA PLUS [place]) MISC USE 1  TO CHECK GLUCOSE ONCE DAILY 02/22/21   Chrismon, Jodell Cipro, PA-C  lovastatin (MEVACOR) 40 MG tablet Take 1 tablet by mouth once daily 09/08/22   Simmons-Robinson, Tawanna Cooler, MD  metFORMIN (GLUCOPHAGE) 500 MG tablet Take 1 tablet (500 mg total) by mouth 2 (two) times daily with a meal. 02/01/23   Simmons-Robinson, Makiera, MD  nitroGLYCERIN (NITROSTAT) 0.4 MG SL tablet Place 1 tablet (0.4 mg total) under the tongue every 5 (five) minutes as needed for chest pain. 07/13/22   Chandrasekhar, Mahesh A, MD  pantoprazole (PROTONIX) 40 MG tablet Take 1 tablet (40 mg total) by mouth daily. 10/25/22 10/25/23  Schnier, Latina Craver, MD  tirzepatide [facility]) 2.5 MG/0.5ML Pen Inject 2.5 mg into the skin once a week. 02/23/23   Christell Constant, MD   Vitals:   02/26/23 2238 02/26/23 2240 02/26/23 2245 02/26/23 2247  BP:      Pulse:  77 79   Resp:  20 16   Temp:      SpO2: (!) 82% 99% 100%    Weight:    93.2 kg   Physical Exam Vitals and nursing note reviewed.  Constitutional:      General: She is not in acute distress. HENT:     Head: Normocephalic and atraumatic.     Right Ear: Hearing normal.     Left Ear: Hearing normal.     Nose: Nose normal. No nasal deformity.     Mouth/Throat:     Lips: Pink.     Tongue: No lesions.     Pharynx: Oropharynx is clear.  Eyes:     General: Lids are normal.     Extraocular Movements: Extraocular movements intact.  Cardiovascular:     Rate and Rhythm: Normal rate and regular rhythm.  Heart sounds: Normal heart sounds.  Pulmonary:     Effort: Pulmonary effort is normal.     Breath sounds: Normal breath sounds.  Abdominal:     General: Bowel sounds are normal. There is no distension.     Palpations: Abdomen is soft. There is no mass.     Tenderness: There is no abdominal tenderness.  Musculoskeletal:     Right lower leg: No edema.     Left lower leg: No edema.  Skin:    General: Skin is warm.  Neurological:     General: No focal deficit present.     Mental Status: She is alert and oriented to person, place, and time.     Cranial Nerves: Cranial nerves 2-12 are intact.  Psychiatric:        Attention and Perception: Attention normal.        Mood and Affect: Mood normal.        Speech: Speech normal.        Behavior: Behavior normal. Behavior is cooperative.   Labs on Admission: I have personally reviewed following labs and imaging studies  CBC: Recent Labs  Lab 02/26/23 2227  WBC 9.8  NEUTROABS 7.9*  HGB 10.8*  HCT 34.7*  MCV 92.5  PLT 141*   Basic Metabolic Panel: Recent Labs  Lab 02/26/23 2227 02/26/23 2333  NA 133* 135  K 5.8* 5.6*  CL 111 111  CO2 13* 15*  GLUCOSE 370* 238*  BUN 43* 45*  CREATININE 2.68* 2.66*  CALCIUM 8.2* 8.5*   GFR: Estimated Creatinine Clearance: 19.3 mL/min (A) (by C-G formula based on SCr of 2.66 mg/dL (H)). Liver Function Tests: Recent Labs  Lab 02/26/23 2227  AST  14*  ALT 10  ALKPHOS 78  BILITOT 0.5  PROT 6.8  ALBUMIN 3.6   No results for input(s): "LIPASE", "AMYLASE" in the last 168 hours. No results for input(s): "AMMONIA" in the last 168 hours. Coagulation Profile: No results for input(s): "INR", "PROTIME" in the last 168 hours. Cardiac Enzymes: No results for input(s): "CKTOTAL", "CKMB", "CKMBINDEX", "TROPONINI" in the last 168 hours. BNP (last 3 results) Recent Labs    01/30/23 1156  PROBNP 395*   HbA1C: No results for input(s): "HGBA1C" in the last 72 hours. CBG: Recent Labs  Lab 02/26/23 2159 02/26/23 2230 02/26/23 2316 02/27/23 0124  GLUCAP 27* 103* 149* 142*   Lipid Profile: No results for input(s): "CHOL", "HDL", "LDLCALC", "TRIG", "CHOLHDL", "LDLDIRECT" in the last 72 hours. Thyroid Function Tests: No results for input(s): "TSH", "T4TOTAL", "FREET4", "T3FREE", "THYROIDAB" in the last 72 hours. Anemia Panel: No results for input(s): "VITAMINB12", "FOLATE", "FERRITIN", "TIBC", "IRON", "RETICCTPCT" in the last 72 hours.  Unresulted Labs (From admission, onward)     Start     Ordered   02/27/23 0500  Comprehensive metabolic panel  Tomorrow morning,   R        02/27/23 0009   02/27/23 0500  CBC  Tomorrow morning,   R        02/27/23 0009   02/27/23 0145  Urine Culture (for pregnant, neutropenic or urologic patients or patients with an indwelling urinary catheter)  (Urine Labs)  Add-on,   AD       Question:  Indication  Answer:  Urgency/frequency   02/27/23 0145   02/27/23 0144  Blood gas, venous  ONCE - STAT,   STAT        02/27/23 0143   02/27/23 0143  Beta-hydroxybutyric acid  Once,  R        02/27/23 0142            Medications  carvedilol (COREG) tablet 25 mg (has no administration in time range)  clopidogrel (PLAVIX) tablet 75 mg (has no administration in time range)  DULoxetine (CYMBALTA) DR capsule 60 mg (has no administration in time range)  gabapentin (NEURONTIN) capsule 100 mg (has no  administration in time range)  nitroGLYCERIN (NITROSTAT) SL tablet 0.4 mg (has no administration in time range)  pantoprazole (PROTONIX) EC tablet 40 mg (has no administration in time range)  aspirin EC tablet 81 mg (has no administration in time range)  isosorbide mononitrate (IMDUR) 24 hr tablet 30 mg (has no administration in time range)  heparin injection 5,000 Units (5,000 Units Subcutaneous Given 02/27/23 0128)  sodium chloride flush (NS) 0.9 % injection 3 mL (3 mLs Intravenous Given 02/27/23 0125)  acetaminophen (TYLENOL) tablet 650 mg (has no administration in time range)    Or  acetaminophen (TYLENOL) suppository 650 mg (has no administration in time range)  pravastatin (PRAVACHOL) tablet 40 mg (has no administration in time range)  dextrose 5 % solution ( Intravenous New Bag/Given 02/27/23 0147)  hydrALAZINE (APRESOLINE) injection 5 mg (has no administration in time range)  cefTRIAXone (ROCEPHIN) 1 g in sodium chloride 0.9 % 100 mL IVPB (has no administration in time range)  dextrose 50 % solution 50 mL (50 mLs Intravenous Given 02/26/23 2157)  sodium chloride 0.9 % bolus 1,000 mL (1,000 mLs Intravenous New Bag/Given 02/27/23 0128)    Radiological Exams on Admission: No results found.  Data Reviewed: Relevant notes from primary care and specialist visits, past discharge summaries as available in EHR, including Care Everywhere. Prior diagnostic testing as pertinent to current admission diagnoses Updated medications and problem lists for reconciliation ED course, including vitals, labs, imaging, treatment and response to treatment Triage notes, nursing and pharmacy notes and ED provider's notes Notable results as noted in HPI  Assessment & Plan Hypoglycemia due to type 2 diabetes mellitus (HCC) 2/2 to glipizide and d/c on discharge.  Dextrose at low rate overnight.  Jardiance, metformin, and insulin held as well.  Accuchecks q 4h.  DM type 2 (diabetes mellitus, type 2)  (HCC) Glycemic protocol. Same as above.  Hyperlipidemia associated with type 2 diabetes mellitus (HCC) Continue pravastatin.  Acid reflux IV PPI.  Essential hypertension Coreg from tomorrow / cont imdur.   Apnea, sleep CPAP for OSA.  PAD (peripheral artery disease) (HCC) Continue asa 81 / mevacor  40 mg.   Acute renal failure superimposed on stage 3b chronic kidney disease (HCC) Lab Results  Component Value Date   CREATININE 2.66 (H) 02/26/2023   CREATININE 2.68 (H) 02/26/2023   CREATININE 1.70 (H) 01/30/2023  Increased from baseline. We will get renal USG.  We will avoid contrast.  Hold ARB.   Anemia    Latest Ref Rng & Units 02/26/2023   10:27 PM 12/25/2022    9:25 AM 06/09/2022    3:32 PM  CBC  WBC 4.0 - 10.5 K/uL 9.8  5.2  5.4   Hemoglobin 12.0 - 15.0 g/dL 16.1  9.8  09.6   Hematocrit 36.0 - 46.0 % 34.7  30.9  33.4   Platelets 150 - 400 K/uL 141  145  146   Stable. From 2 months ago. Monitor.    Thrombocytopenia (HCC) Thrombocytopenia  will monitor.   Metabolic acidosis Pt found to be acidotic with pco2 of 13 and 15 . Chart review  shows chronic pco2 in range of  15 to normal since  2013.  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  We will start pt on po bicarb 650 daily.  Do not suspect this to be from her AKI as she has had same low bicarb since 2013.  UTI (urinary tract infection) Urinalysis is showing glucose which is probably from jardiance , however pt also cath herself and has >50 wbc so we will get culture and start rocephin.  DVT prophylaxis:  Heparin   Consults:  None   Advance Care Planning:    Code Status: Full Code   Family Communication:  None   Disposition Plan:  Home   Severity of Illness: The appropriate patient status for this patient is OBSERVATION. Observation status is judged to be reasonable and necessary in order to provide the required intensity of service to ensure the patient's safety. The patient's presenting symptoms, physical exam  findings, and initial radiographic and laboratory data in the context of their medical condition is felt to place them at decreased risk for further clinical deterioration. Furthermore, it is anticipated that the patient will be medically stable for discharge from the hospital within 2 midnights of admission.   Author: Gertha Calkin, MD 02/27/2023 2:04 AM  For on call review [email or web address].

## 2023-02-26 NOTE — ED Provider Notes (Signed)
[facility] Provider Note    Event Date/Time   First MD Initiated Contact with Patient 02/26/23 2154     (approximate)   History   Hypoglycemia   HPI  Cynthia Sims is a 72 y.o. female with a history of diabetes, hypertension, hyperlipidemia, anemia, chronic kidney disease who presents with lysed weakness and hypoglycemia.  The patient states that she has been feeling somewhat weaker than normal all day.  She states that she ate breakfast and dinner normally.  She took all of her normal morning medications including her long-acting insulin.  However in the evening she felt increasingly weak and then found that her blood glucose was in the 30s.  It went up to 100 with EMS but then back down to the 50s.  The patient denies feel like she is about to pass out and states that she just feels weak.  She denies any acute pain.  She has no fever or chills.  She denies any vomiting or diarrhea.  The patient states that she was started on Mounjaro this week and first took it yesterday.  I reviewed the past medical records.  The patient's most recent outpatient encounter was with Dr. Gilda Crease from vascular surgery on 9/9 for follow-up after an angiogram earlier this year.  She has no recent ED visits or hospitalizations here.  I confirmed THat she was started on Mounjaro this week and was instructed to decrease her glipizide by half, which she states that she did.   Physical Exam   Triage Vital Signs: ED Triage Vitals  Encounter Vitals Group     BP      Systolic BP Percentile      Diastolic BP Percentile      Pulse      Resp      Temp      Temp src      SpO2      Weight      Height      Head Circumference      Peak Flow      Pain Score      Pain Loc      Pain Education      Exclude from Growth Chart     Most recent vital signs: Vitals:   02/26/23 2240 02/26/23 2245  BP:    Pulse: 77 79  Resp: 20 16  Temp:    SpO2: 99% 100%     General: Alert and  oriented, no distress.  CV:  Good peripheral perfusion.  Resp:  Normal effort.  Abd:  No distention.  Other:  Moist mucous membranes.  Normal speech.  Motor intact in all extremities.   ED Results / Procedures / Treatments   Labs (all labs ordered are listed, but only abnormal results are displayed) Labs Reviewed  COMPREHENSIVE METABOLIC PANEL - Abnormal; Notable for the following components:      Result Value   Sodium 133 (*)    Potassium 5.8 (*)    CO2 13 (*)    Glucose, Bld 370 (*)    BUN 43 (*)    Creatinine, Ser 2.68 (*)    Calcium 8.2 (*)    AST 14 (*)    GFR, Estimated 18 (*)    All other components within normal limits  CBC WITH DIFFERENTIAL/PLATELET - Abnormal; Notable for the following components:   RBC 3.75 (*)    Hemoglobin 10.8 (*)    HCT 34.7 (*)    Platelets 141 (*)  Neutro Abs 7.9 (*)    All other components within normal limits  CBG MONITORING, ED - Abnormal; Notable for the following components:   Glucose-Capillary 27 (*)    All other components within normal limits  CBG MONITORING, ED - Abnormal; Notable for the following components:   Glucose-Capillary 103 (*)    All other components within normal limits  CBG MONITORING, ED - Abnormal; Notable for the following components:   Glucose-Capillary 149 (*)    All other components within normal limits  URINALYSIS, ROUTINE W REFLEX MICROSCOPIC  COMPREHENSIVE METABOLIC PANEL     EKG  ED ECG REPORT I, Dionne Bucy, the attending physician, personally viewed and interpreted this ECG.  Date: 02/26/2023 EKG Time: 2240 Rate: 73 Rhythm: normal sinus rhythm QRS Axis: normal Intervals: normal ST/T Wave abnormalities: normal Narrative Interpretation: no evidence of acute ischemia    RADIOLOGY    PROCEDURES:  Critical Care performed: No  Procedures   MEDICATIONS ORDERED IN ED: Medications  sodium chloride 0.9 % bolus 1,000 mL (has no administration in time range)  dextrose 50 %  solution 50 mL (50 mLs Intravenous Given 02/26/23 2157)     IMPRESSION / MDM / ASSESSMENT AND PLAN / ED COURSE  I reviewed the triage vital signs and the nursing notes.  72 year old female with PMH as noted above presents with generalized weakness and hypoglycemia this evening.  Physical exam is unremarkable for acute findings.  The patient is on long-acting insulin gene each morning as well as glipizide and Jardiance, all of which she took this morning but not in the evening.  She was started on [facility] yesterday.  Differential diagnosis includes, but is not limited to, hypoglycemia, most likely medication related, versus dehydration, electrolyte abnormality, AKI or other metabolic disturbance, UTI or other infection.  We will obtain labs, give D50 as the patient's glucose is still low, have her eat something, and reassess.  However given the persistent hyperglycemia and her long-acting medications I anticipate she will need inpatient admission.  Patient's presentation is most consistent with acute presentation with potential threat to life or bodily function.  The patient is on the cardiac monitor to evaluate for evidence of arrhythmia and/or significant heart rate changes.   ----------------------------------------- 11:35 PM on 02/26/2023 -----------------------------------------  Lab workup reveals AKI as well as metabolic acidosis.  CBC shows no leukocytosis or significant anemia.  Given the fluctuations in her glucose today, her long-acting medications, and these lab findings, she will need admission for further workup and treatment.  I consulted Dr. Allena Katz from the hospitalist service; based on our discussion she agrees to evaluate the patient for admission.  FINAL CLINICAL IMPRESSION(S) / ED DIAGNOSES   Final diagnoses:  Hypoglycemia  AKI (acute kidney injury) (HCC)  Metabolic acidosis     Rx / DC Orders   ED Discharge Orders     None        Note:  This document was  prepared using Dragon voice recognition software and may include unintentional dictation errors.    Dionne Bucy, MD 02/26/23 2337

## 2023-02-26 NOTE — Assessment & Plan Note (Signed)
IV PPI. 

## 2023-02-26 NOTE — Assessment & Plan Note (Addendum)
 2/2 to glipizide and d/c on discharge.  Dextrose at low rate overnight.  Jardiance, metformin, and insulin held as well.  Accuchecks q 4h.

## 2023-02-26 NOTE — ED Triage Notes (Addendum)
Pt feeling very week today and her bg has been low all day. Highest was 80 and that was after oral glucose; lowest in 30s. DM2 who started monjaro yesterday. BG here 26; MD at bedside. GCS 15 and maitainging airway so two apple juices and 4 pakcets of graham crackers given while RN works on PIV to give 1 amp D50.   Past Medical History:  Diagnosis Date   ALT (SGPT) level raised    Anemia    Anxiety    Arthritis    Depression    Diabetes mellitus without complication (HCC)    Elevated cholesterol    GERD (gastroesophageal reflux disease)    Hemorrhoids    Hypertension    Kidney disease

## 2023-02-27 ENCOUNTER — Observation Stay: Payer: Medicare HMO

## 2023-02-27 DIAGNOSIS — I2510 Atherosclerotic heart disease of native coronary artery without angina pectoris: Secondary | ICD-10-CM | POA: Diagnosis not present

## 2023-02-27 DIAGNOSIS — G4733 Obstructive sleep apnea (adult) (pediatric): Secondary | ICD-10-CM | POA: Diagnosis not present

## 2023-02-27 DIAGNOSIS — E1151 Type 2 diabetes mellitus with diabetic peripheral angiopathy without gangrene: Secondary | ICD-10-CM | POA: Diagnosis not present

## 2023-02-27 DIAGNOSIS — E1140 Type 2 diabetes mellitus with diabetic neuropathy, unspecified: Secondary | ICD-10-CM | POA: Diagnosis not present

## 2023-02-27 DIAGNOSIS — E11649 Type 2 diabetes mellitus with hypoglycemia without coma: Secondary | ICD-10-CM | POA: Diagnosis present

## 2023-02-27 DIAGNOSIS — N1330 Unspecified hydronephrosis: Secondary | ICD-10-CM | POA: Diagnosis not present

## 2023-02-27 DIAGNOSIS — R339 Retention of urine, unspecified: Secondary | ICD-10-CM | POA: Diagnosis not present

## 2023-02-27 DIAGNOSIS — E872 Acidosis, unspecified: Secondary | ICD-10-CM | POA: Diagnosis not present

## 2023-02-27 DIAGNOSIS — N390 Urinary tract infection, site not specified: Secondary | ICD-10-CM | POA: Diagnosis present

## 2023-02-27 DIAGNOSIS — D631 Anemia in chronic kidney disease: Secondary | ICD-10-CM | POA: Diagnosis not present

## 2023-02-27 DIAGNOSIS — T383X5A Adverse effect of insulin and oral hypoglycemic [antidiabetic] drugs, initial encounter: Secondary | ICD-10-CM | POA: Diagnosis not present

## 2023-02-27 DIAGNOSIS — K219 Gastro-esophageal reflux disease without esophagitis: Secondary | ICD-10-CM | POA: Diagnosis not present

## 2023-02-27 DIAGNOSIS — E1122 Type 2 diabetes mellitus with diabetic chronic kidney disease: Secondary | ICD-10-CM | POA: Diagnosis not present

## 2023-02-27 DIAGNOSIS — K469 Unspecified abdominal hernia without obstruction or gangrene: Secondary | ICD-10-CM | POA: Diagnosis not present

## 2023-02-27 DIAGNOSIS — E7800 Pure hypercholesterolemia, unspecified: Secondary | ICD-10-CM | POA: Diagnosis not present

## 2023-02-27 DIAGNOSIS — Z7984 Long term (current) use of oral hypoglycemic drugs: Secondary | ICD-10-CM | POA: Diagnosis not present

## 2023-02-27 DIAGNOSIS — I129 Hypertensive chronic kidney disease with stage 1 through stage 4 chronic kidney disease, or unspecified chronic kidney disease: Secondary | ICD-10-CM | POA: Diagnosis not present

## 2023-02-27 DIAGNOSIS — E669 Obesity, unspecified: Secondary | ICD-10-CM | POA: Diagnosis not present

## 2023-02-27 DIAGNOSIS — R112 Nausea with vomiting, unspecified: Secondary | ICD-10-CM | POA: Diagnosis not present

## 2023-02-27 DIAGNOSIS — N179 Acute kidney failure, unspecified: Secondary | ICD-10-CM | POA: Diagnosis not present

## 2023-02-27 DIAGNOSIS — M1990 Unspecified osteoarthritis, unspecified site: Secondary | ICD-10-CM | POA: Diagnosis not present

## 2023-02-27 DIAGNOSIS — G3184 Mild cognitive impairment, so stated: Secondary | ICD-10-CM | POA: Diagnosis not present

## 2023-02-27 DIAGNOSIS — E1165 Type 2 diabetes mellitus with hyperglycemia: Secondary | ICD-10-CM | POA: Diagnosis not present

## 2023-02-27 DIAGNOSIS — D696 Thrombocytopenia, unspecified: Secondary | ICD-10-CM | POA: Diagnosis not present

## 2023-02-27 DIAGNOSIS — E1169 Type 2 diabetes mellitus with other specified complication: Secondary | ICD-10-CM | POA: Diagnosis not present

## 2023-02-27 DIAGNOSIS — N1832 Chronic kidney disease, stage 3b: Secondary | ICD-10-CM | POA: Diagnosis not present

## 2023-02-27 DIAGNOSIS — Z794 Long term (current) use of insulin: Secondary | ICD-10-CM | POA: Diagnosis not present

## 2023-02-27 DIAGNOSIS — I8390 Asymptomatic varicose veins of unspecified lower extremity: Secondary | ICD-10-CM | POA: Diagnosis not present

## 2023-02-27 LAB — COMPREHENSIVE METABOLIC PANEL
ALT: 10 U/L (ref 0–44)
AST: 14 U/L — ABNORMAL LOW (ref 15–41)
Albumin: 3.6 g/dL (ref 3.5–5.0)
Alkaline Phosphatase: 74 U/L (ref 38–126)
Anion gap: 8 (ref 5–15)
BUN: 43 mg/dL — ABNORMAL HIGH (ref 8–23)
CO2: 16 mmol/L — ABNORMAL LOW (ref 22–32)
Calcium: 8.7 mg/dL — ABNORMAL LOW (ref 8.9–10.3)
Chloride: 113 mmol/L — ABNORMAL HIGH (ref 98–111)
Creatinine, Ser: 2.53 mg/dL — ABNORMAL HIGH (ref 0.44–1.00)
GFR, Estimated: 19 mL/min — ABNORMAL LOW (ref >60)
Glucose, Bld: 73 mg/dL (ref 70–99)
Potassium: 5.1 mmol/L (ref 3.5–5.1)
Sodium: 137 mmol/L (ref 135–145)
Total Bilirubin: 0.3 mg/dL (ref 0.3–1.2)
Total Protein: 6.7 g/dL (ref 6.5–8.1)

## 2023-02-27 LAB — BASIC METABOLIC PANEL
Anion gap: 7 (ref 5–15)
Anion gap: 8 (ref 5–15)
Anion gap: 9 (ref 5–15)
BUN: 34 mg/dL — ABNORMAL HIGH (ref 8–23)
BUN: 35 mg/dL — ABNORMAL HIGH (ref 8–23)
BUN: 45 mg/dL — ABNORMAL HIGH (ref 8–23)
CO2: 15 mmol/L — ABNORMAL LOW (ref 22–32)
CO2: 17 mmol/L — ABNORMAL LOW (ref 22–32)
CO2: 20 mmol/L — ABNORMAL LOW (ref 22–32)
Calcium: 8.3 mg/dL — ABNORMAL LOW (ref 8.9–10.3)
Calcium: 8.5 mg/dL — ABNORMAL LOW (ref 8.9–10.3)
Calcium: 8.5 mg/dL — ABNORMAL LOW (ref 8.9–10.3)
Chloride: 109 mmol/L (ref 98–111)
Chloride: 111 mmol/L (ref 98–111)
Chloride: 112 mmol/L — ABNORMAL HIGH (ref 98–111)
Creatinine, Ser: 2.3 mg/dL — ABNORMAL HIGH (ref 0.44–1.00)
Creatinine, Ser: 2.45 mg/dL — ABNORMAL HIGH (ref 0.44–1.00)
Creatinine, Ser: 2.66 mg/dL — ABNORMAL HIGH (ref 0.44–1.00)
GFR, Estimated: 18 mL/min — ABNORMAL LOW (ref >60)
GFR, Estimated: 20 mL/min — ABNORMAL LOW (ref >60)
GFR, Estimated: 22 mL/min — ABNORMAL LOW (ref >60)
Glucose, Bld: 123 mg/dL — ABNORMAL HIGH (ref 70–99)
Glucose, Bld: 167 mg/dL — ABNORMAL HIGH (ref 70–99)
Glucose, Bld: 238 mg/dL — ABNORMAL HIGH (ref 70–99)
Potassium: 4.5 mmol/L (ref 3.5–5.1)
Potassium: 4.6 mmol/L (ref 3.5–5.1)
Potassium: 5.6 mmol/L — ABNORMAL HIGH (ref 3.5–5.1)
Sodium: 135 mmol/L (ref 135–145)
Sodium: 136 mmol/L (ref 135–145)
Sodium: 137 mmol/L (ref 135–145)

## 2023-02-27 LAB — BETA-HYDROXYBUTYRIC ACID: Beta-Hydroxybutyric Acid: 0.06 mmol/L (ref 0.05–0.27)

## 2023-02-27 LAB — URINALYSIS, ROUTINE W REFLEX MICROSCOPIC
Bilirubin Urine: NEGATIVE
Glucose, UA: >=500 mg/dL — AB
Hgb urine dipstick: NEGATIVE
Ketones, ur: NEGATIVE mg/dL
Nitrite: NEGATIVE
Protein, ur: NEGATIVE mg/dL
Specific Gravity, Urine: 1.013 (ref 1.005–1.030)
WBC, UA: >50 WBC/hpf (ref 0–5)
pH: 5 (ref 5.0–8.0)

## 2023-02-27 LAB — LACTIC ACID, PLASMA: Lactic Acid, Venous: 0.8 mmol/L (ref 0.5–1.9)

## 2023-02-27 LAB — HM DIABETES EYE EXAM

## 2023-02-27 LAB — GLUCOSE, CAPILLARY
Glucose-Capillary: 129 mg/dL — ABNORMAL HIGH (ref 70–99)
Glucose-Capillary: 157 mg/dL — ABNORMAL HIGH (ref 70–99)
Glucose-Capillary: 163 mg/dL — ABNORMAL HIGH (ref 70–99)

## 2023-02-27 LAB — CBG MONITORING, ED
Glucose-Capillary: 142 mg/dL — ABNORMAL HIGH (ref 70–99)
Glucose-Capillary: 95 mg/dL (ref 70–99)
Glucose-Capillary: 71 mg/dL (ref 70–99)
Glucose-Capillary: 156 mg/dL — ABNORMAL HIGH (ref 70–99)
Glucose-Capillary: 208 mg/dL — ABNORMAL HIGH (ref 70–99)
Glucose-Capillary: 53 mg/dL — ABNORMAL LOW (ref 70–99)
Glucose-Capillary: 126 mg/dL — ABNORMAL HIGH (ref 70–99)

## 2023-02-27 LAB — BLOOD GAS, VENOUS
Acid-base deficit: 13.1 mmol/L — ABNORMAL HIGH (ref 0.0–2.0)
Bicarbonate: 14.1 mmol/L — ABNORMAL LOW (ref 20.0–28.0)
O2 Saturation: 51.2 %
Patient temperature: 37
pCO2, Ven: 36 mm[Hg] — ABNORMAL LOW (ref 44–60)
pH, Ven: 7.2 — ABNORMAL LOW (ref 7.25–7.43)
pO2, Ven: 32 mm[Hg] (ref 32–45)

## 2023-02-27 MED ORDER — ACETAMINOPHEN 325 MG PO TABS
650.0000 mg | ORAL_TABLET | Freq: Four times a day (QID) | ORAL | Status: DC | PRN
  Filled 2023-02-27: qty 2

## 2023-02-27 MED ORDER — ISOSORBIDE MONONITRATE ER 30 MG PO TB24
30.0000 mg | ORAL_TABLET | Freq: Every day | ORAL | Status: DC
  Administered 2023-02-27 – 2023-03-01 (×3): 30 mg via ORAL
  Filled 2023-02-27 (×4): qty 1

## 2023-02-27 MED ORDER — ONDANSETRON HCL 4 MG/2ML IJ SOLN
INTRAMUSCULAR | Status: AC
  Filled 2023-02-27: qty 2

## 2023-02-27 MED ORDER — DULOXETINE HCL 30 MG PO CPEP
60.0000 mg | ORAL_CAPSULE | Freq: Every day | ORAL | Status: DC
  Administered 2023-02-27 – 2023-03-01 (×3): 60 mg via ORAL
  Filled 2023-02-27: qty 1
  Filled 2023-02-27 (×2): qty 2
  Filled 2023-02-27: qty 1

## 2023-02-27 MED ORDER — NITROGLYCERIN 0.4 MG SL SUBL: 0.4000 mg | SUBLINGUAL_TABLET | SUBLINGUAL | Status: DC | PRN

## 2023-02-27 MED ORDER — ACETAMINOPHEN 650 MG RE SUPP: 650.0000 mg | Freq: Four times a day (QID) | RECTAL | Status: DC | PRN

## 2023-02-27 MED ORDER — PRAVASTATIN SODIUM 20 MG PO TABS
40.0000 mg | ORAL_TABLET | Freq: Every day | ORAL | Status: DC
  Administered 2023-02-27 – 2023-02-28 (×2): 40 mg via ORAL
  Filled 2023-02-27 (×2): qty 2

## 2023-02-27 MED ORDER — SODIUM CHLORIDE 0.9% FLUSH
3.0000 mL | Freq: Two times a day (BID) | INTRAVENOUS | Status: DC
  Administered 2023-02-27 – 2023-03-01 (×5): 3 mL via INTRAVENOUS

## 2023-02-27 MED ORDER — HYDRALAZINE HCL 20 MG/ML IJ SOLN: 5.0000 mg | Freq: Four times a day (QID) | INTRAMUSCULAR | Status: DC | PRN

## 2023-02-27 MED ORDER — DEXTROSE 5 % IV SOLN: INTRAVENOUS | Status: DC

## 2023-02-27 MED ORDER — SODIUM BICARBONATE 650 MG PO TABS
650.0000 mg | ORAL_TABLET | Freq: Two times a day (BID) | ORAL | Status: DC
  Administered 2023-02-27: 650 mg via ORAL
  Filled 2023-02-27: qty 1

## 2023-02-27 MED ORDER — SODIUM BICARBONATE 8.4 % IV SOLN
INTRAVENOUS | Status: DC
  Filled 2023-02-27: qty 150
  Filled 2023-02-27 (×2): qty 1000

## 2023-02-27 MED ORDER — ONDANSETRON HCL 4 MG/2ML IJ SOLN
4.0000 mg | Freq: Four times a day (QID) | INTRAMUSCULAR | Status: DC | PRN
  Administered 2023-02-27: 4 mg via INTRAVENOUS

## 2023-02-27 MED ORDER — ASPIRIN 81 MG PO TBEC
81.0000 mg | DELAYED_RELEASE_TABLET | Freq: Every day | ORAL | Status: DC
  Administered 2023-02-27 – 2023-03-01 (×3): 81 mg via ORAL
  Filled 2023-02-27 (×4): qty 1

## 2023-02-27 MED ORDER — DEXTROSE-SODIUM CHLORIDE 5-0.9 % IV SOLN: INTRAVENOUS | Status: DC

## 2023-02-27 MED ORDER — SODIUM CHLORIDE 0.9 % IV SOLN
1.0000 g | Freq: Every day | INTRAVENOUS | Status: DC
  Administered 2023-02-27: 1 g via INTRAVENOUS
  Filled 2023-02-27: qty 10

## 2023-02-27 MED ORDER — GABAPENTIN 100 MG PO CAPS
100.0000 mg | ORAL_CAPSULE | Freq: Three times a day (TID) | ORAL | Status: DC
  Administered 2023-02-27 – 2023-03-01 (×7): 100 mg via ORAL
  Filled 2023-02-27 (×7): qty 1

## 2023-02-27 MED ORDER — PANTOPRAZOLE SODIUM 40 MG PO TBEC: 40.0000 mg | DELAYED_RELEASE_TABLET | Freq: Every day | ORAL | Status: DC

## 2023-02-27 MED ORDER — SODIUM CHLORIDE 0.9 % IV SOLN
12.5000 mg | Freq: Four times a day (QID) | INTRAVENOUS | Status: DC | PRN
  Filled 2023-02-27: qty 0.5

## 2023-02-27 MED ORDER — HEPARIN SODIUM (PORCINE) 5000 UNIT/ML IJ SOLN
5000.0000 [IU] | Freq: Two times a day (BID) | INTRAMUSCULAR | Status: DC
  Administered 2023-02-27 – 2023-03-01 (×6): 5000 [IU] via SUBCUTANEOUS
  Filled 2023-02-27 (×6): qty 1

## 2023-02-27 MED ORDER — CARVEDILOL 25 MG PO TABS
25.0000 mg | ORAL_TABLET | Freq: Two times a day (BID) | ORAL | Status: DC
  Administered 2023-02-27 – 2023-03-01 (×4): 25 mg via ORAL
  Filled 2023-02-27 (×5): qty 1

## 2023-02-27 MED ORDER — CLOPIDOGREL BISULFATE 75 MG PO TABS
75.0000 mg | ORAL_TABLET | Freq: Every day | ORAL | Status: DC
  Administered 2023-02-27 – 2023-03-01 (×3): 75 mg via ORAL
  Filled 2023-02-27 (×4): qty 1

## 2023-02-27 MED ORDER — PANTOPRAZOLE SODIUM 40 MG IV SOLR
40.0000 mg | Freq: Every day | INTRAVENOUS | Status: DC
  Administered 2023-02-27 – 2023-02-28 (×2): 40 mg via INTRAVENOUS
  Filled 2023-02-27 (×2): qty 10

## 2023-02-27 NOTE — Progress Notes (Signed)
Ordered placed for patient to have cpap at night. Pt states "does not have sleep apena and has never worn a cpap at home".

## 2023-02-27 NOTE — Assessment & Plan Note (Addendum)
Glycemic protocol. Same as above.

## 2023-02-27 NOTE — Assessment & Plan Note (Addendum)
Urinalysis is showing glucose which is probably from jardiance , however pt also cath herself and has >50 wbc so we will get culture and start rocephin.

## 2023-02-27 NOTE — Assessment & Plan Note (Addendum)
Thrombocytopenia  will monitor.

## 2023-02-27 NOTE — Assessment & Plan Note (Addendum)
    Latest Ref Rng & Units 02/26/2023   10:27 PM 12/25/2022    9:25 AM 06/09/2022    3:32 PM  CBC  WBC 4.0 - 10.5 K/uL 9.8  5.2  5.4   Hemoglobin 12.0 - 15.0 g/dL 62.9  9.8  52.8   Hematocrit 36.0 - 46.0 % 34.7  30.9  33.4   Platelets 150 - 400 K/uL 141  145  146   Stable. From 2 months ago. Monitor.

## 2023-02-27 NOTE — Inpatient Diabetes Management (Signed)
Inpatient Diabetes Program Recommendations  AACE/ADA: New Consensus Statement on Inpatient Glycemic Control (2015)  Target Ranges:  Prepandial:   less than 140 mg/dL      Peak postprandial:   less than 180 mg/dL (1-2 hours)      Critically ill patients:  140 - 180 mg/dL   Lab Results  Component Value Date   GLUCAP 208 (H) 02/27/2023   HGBA1C 7.4 (H) 12/25/2022    Review of Glycemic Control  Latest Reference Range & Units 02/27/23 01:24 02/27/23 03:55 02/27/23 07:06 02/27/23 07:50 02/27/23 07:55 02/27/23 07:57  Glucose-Capillary 70 - 99 mg/dL 161 (H) 95 71 53 (L) 096 (H) 208 (H)   Diabetes history: DM  Outpatient Diabetes medications:  Mounjaro 2.5 mg weekly Metformin 500 mg bid Basaglar 24 units daily Glucotrol 5 mg bid Current orders for Inpatient glycemic control:  CBG's q 4 hours Inpatient Diabetes Program Recommendations:    Note low blood sugars.  Agree with holding oral agents.   May not need insulin with start of Mounjaro as well?  Thanks,  Beryl Meager, RN, BC-ADM Inpatient Diabetes Coordinator Pager [phone]  (8a-5p)

## 2023-02-27 NOTE — ED Notes (Signed)
Pt was provided crackers and apple juice by night RN.

## 2023-02-27 NOTE — ED Notes (Signed)
First 0800 CBG check (of 53) was with finger prick, had to prick twice and squeeze for sample. Rechecked 2 more times using blood drawn from IV with syringe. Wasted 9cc blood before checking. These checks revealed CBG of 156 and 208. Pt is alert and mentating appropriately.

## 2023-02-27 NOTE — ED Notes (Signed)
Pt started profusely vomiting all over self and floor. Pt has whole beans and chunks of food in emesis family said was two nights ago. Provider called for orders. Pt states this came on all of a sudden. Total emesis around 800 ml. Pt was also vomiting was so intense she had an episode of bowel incontinence. Pt did not have any of these symptoms today or yesterday.

## 2023-02-27 NOTE — ED Notes (Addendum)
BG 71. Pt given apple juice and graham crackers to have to increase bg. Dayshift RN is paging MD to see if dextrose continuous should be increased.

## 2023-02-27 NOTE — Assessment & Plan Note (Addendum)
Pt found to be acidotic with pco2 of 13 and 15 . Chart review shows chronic pco2 in range of  15 to normal since  2013.  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  We will start pt on po bicarb 650 daily.  Do not suspect this to be from her AKI as she has had same low bicarb since 2013.

## 2023-02-27 NOTE — ED Notes (Signed)
Inpt MD at bedside

## 2023-02-27 NOTE — Assessment & Plan Note (Signed)
Lab Results  Component Value Date   CREATININE 2.68 (H) 02/26/2023   CREATININE 1.70 (H) 01/30/2023   CREATININE 1.74 (H) 12/25/2022  Increased from baseline.  We will avoid contrast.  Hold ARB.

## 2023-02-27 NOTE — Progress Notes (Addendum)
Interim progress note not for billing  Admitted by my colleague earlier today. I have reviewed chart, seen and examined patient. Agree with assessment and plan unless otherwise stipulated. Hx dm, ckd 3b, chronic urinary retention with regular self cath, who presents with one day of weakness and malaise. Found to have hypoglycemia, AKI, and metabolic acidosis. Was started on jardiance early August and monjouro a few days ago. Also on glipizide metformin. Suspect jardiance contributed to aki, suspect metformin also contributing to lactic acidosis (lactate pending). Doesn't appear to be euglycemic dka as no ketones and beta-h are neg. Renal u/s with pyelectasis likely 2/2 chronic urinary retention. Will change fluids to d5 bicarb to treat acidosis and hypoglycemia, will monitor bmp q8 today. Denies uti symptoms so will stop ceftriaxone. Discussed case w/ Dr. Lonna Cobb of urology who advises foley for bladder decompression given the AKI and signs pyelectasis on renal u/s. Anticipate holding at a minimum jardiance, metformin, and glipizide at discharge. Glp-1s have been implicated in AKIs as well.

## 2023-02-27 NOTE — Assessment & Plan Note (Addendum)
Continue pravastatin 

## 2023-02-28 DIAGNOSIS — E11649 Type 2 diabetes mellitus with hypoglycemia without coma: Secondary | ICD-10-CM | POA: Diagnosis not present

## 2023-02-28 LAB — BASIC METABOLIC PANEL
Anion gap: 8 (ref 5–15)
BUN: 30 mg/dL — ABNORMAL HIGH (ref 8–23)
CO2: 22 mmol/L (ref 22–32)
Calcium: 8.3 mg/dL — ABNORMAL LOW (ref 8.9–10.3)
Chloride: 108 mmol/L (ref 98–111)
Creatinine, Ser: 1.99 mg/dL — ABNORMAL HIGH (ref 0.44–1.00)
GFR, Estimated: 26 mL/min — ABNORMAL LOW (ref >60)
Glucose, Bld: 218 mg/dL — ABNORMAL HIGH (ref 70–99)
Potassium: 4.8 mmol/L (ref 3.5–5.1)
Sodium: 138 mmol/L (ref 135–145)

## 2023-02-28 LAB — GLUCOSE, CAPILLARY
Glucose-Capillary: 203 mg/dL — ABNORMAL HIGH (ref 70–99)
Glucose-Capillary: 214 mg/dL — ABNORMAL HIGH (ref 70–99)
Glucose-Capillary: 244 mg/dL — ABNORMAL HIGH (ref 70–99)
Glucose-Capillary: 179 mg/dL — ABNORMAL HIGH (ref 70–99)

## 2023-02-28 MED ORDER — CHLORHEXIDINE GLUCONATE CLOTH 2 % EX PADS
6.0000 | MEDICATED_PAD | Freq: Every day | CUTANEOUS | Status: DC
  Administered 2023-02-28 – 2023-03-01 (×2): 6 via TOPICAL

## 2023-02-28 MED ORDER — INSULIN GLARGINE-YFGN 100 UNIT/ML ~~LOC~~ SOLN
15.0000 [IU] | SUBCUTANEOUS | Status: DC
  Administered 2023-02-28 – 2023-03-01 (×2): 15 [IU] via SUBCUTANEOUS
  Filled 2023-02-28 (×2): qty 0.15

## 2023-02-28 MED ORDER — PANTOPRAZOLE SODIUM 40 MG PO TBEC
40.0000 mg | DELAYED_RELEASE_TABLET | Freq: Every day | ORAL | Status: DC
  Administered 2023-03-01: 40 mg via ORAL
  Filled 2023-02-28: qty 1

## 2023-02-28 NOTE — Plan of Care (Signed)
  Problem: Education: Goal: Understanding of CV disease, CV risk reduction, and recovery process will improve Outcome: Progressing Goal: Individualized Educational Video(s) Outcome: Progressing   Problem: Cardiovascular: Goal: Ability to achieve and maintain adequate cardiovascular perfusion will improve Outcome: Progressing Goal: Vascular access site(s) Level 0-1 will be maintained Outcome: Progressing   Problem: Coping: Goal: Ability to adjust to condition or change in health will improve Outcome: Progressing

## 2023-02-28 NOTE — Hospital Course (Addendum)
Cynthia Sims is a 72 y.o. female with medical history significant for DM2, CKD 3b, chronic urinary retention with regular self cath. Presented to ED w/ Hypoglycemia.  Pt has been weak and has had glucose in the 30's at home. Was started on jardiance early August and monjouro a few days ago. Also on glipizide metformin.  09/16: admitted w/ hypoglycemia, AKI on CKD3b, metabolic acidosis. Suspect jardiance contributed to aki, suspect metformin also contributing to lactic acidosis. Renal u/s with pyelectasis likely 2/2 chronic urinary retention.  09/17: fluids w/ d5 bicarb to treat acidosis and hypoglycemia, monitor bmp q8h. Cr improving to 2.3. Dr Ashok Pall hospitalist d/w Dr. Lonna Cobb urology who advises foley for bladder decompression given the AKI and signs pyelectasis on renal u/s. Anticipate holding at a minimum jardiance, metformin, and glipizide at discharge. Glp-1s have been implicated in AKIs as well.  09/18: Cr improving 1.99. Glc climbing, restarting basal insulin today at lower than home dose (home 24 units, starting back at 15 units) stopped D5 fluids. If Glc normalized and ambulating well hopeful for d/c early tomorrow  09/19: renal function continues to improve, Glc mild hyperglycemia but stable for discharge to follow outpatient - will d/c antihyperglycemic meds except for long-acting insulin and have given instructions on titration up on this based on fasting Glc at home and follow w/ PCP for any changes   Consultants:  none  Procedures: none      ASSESSMENT & PLAN:   Principal Problem:   Hypoglycemia due to type 2 diabetes mellitus (HCC) Active Problems:   DM type 2 (diabetes mellitus, type 2) (HCC)   Hyperlipidemia associated with type 2 diabetes mellitus (HCC)   Acid reflux   Essential hypertension   Apnea, sleep   PAD (peripheral artery disease) (HCC)   Acute renal failure superimposed on stage 3b chronic kidney disease (HCC)   Anemia   Chronic kidney disease (CKD), stage  III (moderate) (HCC)   Mild cognitive disorder   Thrombocytopenia (HCC)   Metabolic acidosis   UTI (urinary tract infection)   Urinary retention   Hypoglycemia associated with type 2 diabetes mellitus (HCC)   Hypoglycemia due to type 2 diabetes mellitus Poorly controlled DM2 w/ hypoglycemia  D/c metformin and glipizide Reduced insulin Home w/ 15 units to titrate up based on fasting Glc see instructions on med rec   AKI on CKD3b  complicated by urinary retention  Improving Renal US c/w medical renal disease, no hydronephrosis  D/c fluids today Montior BMP in am Foley catheter in place, will need outpatient f/u w/ urology    HLD Continue pravastatin   GERD Continue PPI  HTN HLD CAD PVD Continue carvedilol, Imdur, pravastatin, ASA< Plavix   OSA CPAP at bedtime   Mental Health Continue Cymbalta  Neuropathy Continue gabapentin    obesity based on BMI: Body mass index is 38.83 kg/m.   DVT prophylaxis: heparin given AKI IV fluids: have stopped continuous IV fluids  Nutrition: carb modified  Central lines / invasive devices: Foley cath d/t retentsion  Code Status: FULL CODE ACP documentation reviewed: none on file in [facility]  TOC needs: none at this time Barriers to dispo / significant pending items: if off IV fluids and Glc appropriate and BMP appropriate can hopefully d/c in AM 09/19

## 2023-02-28 NOTE — TOC CM/SW Note (Signed)
Transition of Care [facility]) - Inpatient Brief Assessment   Patient Details  Name: Cynthia Sims MRN: [id number] Date of Birth: 1951/02/14  [facility]) CM/SW Contact:    Chapman Fitch, RN Phone Number: 02/28/2023, 12:08 PM   Clinical Narrative:   Transition of Care [facility]) Screening Note   Patient Details  Name: Cynthia Sims Date of Birth: Mar 24, 1951   Transition of Care [facility]) CM/SW Contact:    Chapman Fitch, RN Phone Number: 02/28/2023, 12:08 PM    Transition of Care Department [facility]) has reviewed patient and no TOC needs have been identified at this time. We will continue to monitor patient advancement through interdisciplinary progression rounds. If new patient transition needs arise, please place a TOC consult.    Transition of Care Asessment: Insurance and Status: Insurance coverage has been reviewed Patient has primary care physician: Yes     Prior/Current Home Services: No current home services Social Determinants of Health Reivew: SDOH reviewed no interventions necessary Readmission risk has been reviewed: Yes Transition of care needs: no transition of care needs at this time

## 2023-02-28 NOTE — Progress Notes (Signed)
PROGRESS NOTE    Cynthia Sims   GBT:[id number] DOB: April 27, 1951  DOA: 02/26/2023 Date of Service: 02/28/23 PCP: [name], MD     Brief Narrative / Hospital Course:  Cynthia Sims is a 72 y.o. female with medical history significant for DM2, CKD 3b, chronic urinary retention with regular self cath. Presented to ED w/ Hypoglycemia.  Pt has been weak and has had glucose in the 30's at home. Was started on jardiance early August and monjouro a few days ago. Also on glipizide metformin.  09/16: admitted w/ hypoglycemia, AKI on CKD3b, metabolic acidosis. Suspect jardiance contributed to aki, suspect metformin also contributing to lactic acidosis. Renal u/s with pyelectasis likely 2/2 chronic urinary retention.  09/17: fluids w/ d5 bicarb to treat acidosis and hypoglycemia, monitor bmp q8h. Cr improving to 2.3. Dr Ashok Pall hospitalist d/w Dr. Lonna Cobb urology who advises foley for bladder decompression given the AKI and signs pyelectasis on renal u/s. Anticipate holding at a minimum jardiance, metformin, and glipizide at discharge. Glp-1s have been implicated in AKIs as well.  09/18: Cr improving 1.99. Glc climbing, restarting basal insulin today at lower than home dose (home 24 units, starting back at 15 units) stopped D5 fluids. If Glc normalized and ambulating well hopeful for d/c early tomorrow   Consultants:  none  Procedures: none      ASSESSMENT & PLAN:   Principal Problem:   Hypoglycemia due to type 2 diabetes mellitus (HCC) Active Problems:   DM type 2 (diabetes mellitus, type 2) (HCC)   Hyperlipidemia associated with type 2 diabetes mellitus (HCC)   Acid reflux   Essential hypertension   Apnea, sleep   PAD (peripheral artery disease) (HCC)   Acute renal failure superimposed on stage 3b chronic kidney disease (HCC)   Anemia   Chronic kidney disease (CKD), stage III (moderate) (HCC)   Mild cognitive disorder   Thrombocytopenia (HCC)   Metabolic acidosis    UTI (urinary tract infection)   Urinary retention   Hypoglycemia associated with type 2 diabetes mellitus (HCC)   Hypoglycemia due to type 2 diabetes mellitus Poorly controlled DM2 w/ hypoglycemia  D/c metformin and glipizide Reduced insulin  AKI on CKD3b  complicated by urinary retention  Improving Renal US c/w medical renal disease, no hydronephrosis  D/c fluids today Montior BMP in am Foley catheter in place, will need outpatient f/u w/ urology    HLD Continue pravastatin   GERD Continue PPI  HTN HLD CAD PVD Continue carvedilol, Imdur, pravastatin, ASA< Plavix   OSA CPAP at bedtime   Mental Health Continue Cymbalta  Neuropathy Continue gabapentin    obesity based on BMI: Body mass index is 38.83 kg/m.   DVT prophylaxis: heparin given AKI IV fluids: have stopped continuous IV fluids  Nutrition: carb modified  Central lines / invasive devices: Foley cath d/t retentsion  Code Status: FULL CODE ACP documentation reviewed: none on file in [facility]  TOC needs: none at this time Barriers to dispo / significant pending items: if off IV fluids and Glc appropriate and BMP appropriate can hopefully d/c in AM 09/19             Subjective / Brief ROS:  Patient reports doing okay today no concerns Denies CP/SOB.  Pain controlled.  Denies new weakness.  Tolerating diet.  Reports no concerns w/ urination/defecation.   Family Communication: none at this time    Objective Findings:  Vitals:   02/27/23 1619 02/27/23 2006 02/28/23 0353 02/28/23 0818  BP: Marland Kitchen)  158/44 (!) 128/48 (!) 159/90 (!) 132/54  Pulse: 81 76 (!) 106 83  Resp: 20 18 17 18   Temp: 98.1 F (36.7 C) 98.2 F (36.8 C) 98 F (36.7 C) 98 F (36.7 C)  TempSrc: Oral Oral Oral Oral  SpO2: 100% 100% 96% 98%  Weight:        Intake/Output Summary (Last 24 hours) at 02/28/2023 1531 Last data filed at 02/28/2023 1150 Gross per 24 hour  Intake 1846.65 ml  Output 1600 ml  Net 246.65 ml    Filed Weights   02/26/23 2247  Weight: 93.2 kg    Examination:  Physical Exam Constitutional:      General: She is not in acute distress.    Appearance: She is not ill-appearing.  Cardiovascular:     Rate and Rhythm: Normal rate and regular rhythm.  Pulmonary:     Effort: Pulmonary effort is normal.     Breath sounds: Normal breath sounds.  Abdominal:     Palpations: Abdomen is soft.  Musculoskeletal:     Right lower leg: No edema.     Left lower leg: No edema.  Skin:    General: Skin is warm and dry.  Neurological:     General: No focal deficit present.     Mental Status: She is alert and oriented to person, place, and time.  Psychiatric:        Mood and Affect: Mood normal.        Behavior: Behavior normal.          Scheduled Medications:   aspirin EC  81 mg Oral Daily   carvedilol  25 mg Oral BID WC   Chlorhexidine Gluconate Cloth  6 each Topical Daily   clopidogrel  75 mg Oral Daily   DULoxetine  60 mg Oral Daily   gabapentin  100 mg Oral TID   heparin  5,000 Units Subcutaneous BID   insulin glargine-yfgn  15 Units Subcutaneous Q24H   isosorbide mononitrate  30 mg Oral Daily   [START ON 03/01/2023] pantoprazole  40 mg Oral Daily   pravastatin  40 mg Oral q1800   sodium chloride flush  3 mL Intravenous Q12H    Continuous Infusions:   PRN Medications:  acetaminophen **OR** acetaminophen, nitroGLYCERIN, ondansetron (ZOFRAN) IV  Antimicrobials from admission:  Anti-infectives (From admission, onward)    Start     Dose/Rate Route Frequency Ordered Stop   02/27/23 0200  cefTRIAXone (ROCEPHIN) 1 g in sodium chloride 0.9 % 100 mL IVPB  Status:  Discontinued       Note to Pharmacy: GIVE AFTER URINE CULTURE HAS BEEN ADDED, AND STOP JARDIANCE   1 g 200 mL/hr over 30 Minutes Intravenous Daily at bedtime 02/27/23 0153 02/27/23 0859           Data Reviewed:  I have personally reviewed the following...  CBC: Recent Labs  Lab 02/26/23 2227  WBC  9.8  NEUTROABS 7.9*  HGB 10.8*  HCT 34.7*  MCV 92.5  PLT 141*   Basic Metabolic Panel: Recent Labs  Lab 02/26/23 2333 02/27/23 0655 02/27/23 1228 02/27/23 1941 02/28/23 0417  NA 135 137 136 137 138  K 5.6* 5.1 4.5 4.6 4.8  CL 111 113* 112* 109 108  CO2 15* 16* 17* 20* 22  GLUCOSE 238* 73 123* 167* 218*  BUN 45* 43* 35* 34* 30*  CREATININE 2.66* 2.53* 2.45* 2.30* 1.99*  CALCIUM 8.5* 8.7* 8.5* 8.3* 8.3*   GFR: Estimated Creatinine Clearance: 25.8 mL/min (A) (  by C-G formula based on SCr of 1.99 mg/dL (H)). Liver Function Tests: Recent Labs  Lab 02/26/23 2227 02/27/23 0655  AST 14* 14*  ALT 10 10  ALKPHOS 78 74  BILITOT 0.5 0.3  PROT 6.8 6.7  ALBUMIN 3.6 3.6   No results for input(s): "LIPASE", "AMYLASE" in the last 168 hours. No results for input(s): "AMMONIA" in the last 168 hours. Coagulation Profile: No results for input(s): "INR", "PROTIME" in the last 168 hours. Cardiac Enzymes: No results for input(s): "CKTOTAL", "CKMB", "CKMBINDEX", "TROPONINI" in the last 168 hours. BNP (last 3 results) Recent Labs    01/30/23 1156  PROBNP 395*   HbA1C: No results for input(s): "HGBA1C" in the last 72 hours. CBG: Recent Labs  Lab 02/27/23 2008 02/27/23 2336 02/28/23 0353 02/28/23 0816 02/28/23 1156  GLUCAP 129* 163* 203* 214* 244*   Lipid Profile: No results for input(s): "CHOL", "HDL", "LDLCALC", "TRIG", "CHOLHDL", "LDLDIRECT" in the last 72 hours. Thyroid Function Tests: No results for input(s): "TSH", "T4TOTAL", "FREET4", "T3FREE", "THYROIDAB" in the last 72 hours. Anemia Panel: No results for input(s): "VITAMINB12", "FOLATE", "FERRITIN", "TIBC", "IRON", "RETICCTPCT" in the last 72 hours. Most Recent Urinalysis On File:     Component Value Date/Time   COLORURINE YELLOW (A) 02/26/2023 2333   APPEARANCEUR HAZY (A) 02/26/2023 2333   APPEARANCEUR Hazy (A) 04/24/2022 0931   LABSPEC 1.013 02/26/2023 2333   PHURINE 5.0 02/26/2023 2333   GLUCOSEU >=500 (A)  02/26/2023 2333   HGBUR NEGATIVE 02/26/2023 2333   BILIRUBINUR NEGATIVE 02/26/2023 2333   BILIRUBINUR Negative 04/24/2022 0931   KETONESUR NEGATIVE 02/26/2023 2333   PROTEINUR NEGATIVE 02/26/2023 2333   UROBILINOGEN 0.2 02/24/2019 1123   NITRITE NEGATIVE 02/26/2023 2333   LEUKOCYTESUR LARGE (A) 02/26/2023 2333   Sepsis Labs: @LABRCNTIP (procalcitonin:4,lacticidven:4) Microbiology: Recent Results (from the past 240 hour(s))  Urine Culture (for pregnant, neutropenic or urologic patients or patients with an indwelling urinary catheter)     Status: Abnormal (Preliminary result)   Collection Time: 02/26/23 11:33 PM   Specimen: In/Out Cath Urine  Result Value Ref Range Status   Specimen Description   Final    IN/OUT CATH URINE Performed at [facility], [address], [place], [place] [address]    Special Requests   Final    NONE Performed at [facility], [address], [place], [place] [address]    Culture (A)  Final    >=100,000 COLONIES/mL GRAM NEGATIVE RODS 40,000 COLONIES/mL YEAST SUSCEPTIBILITIES TO FOLLOW Performed at [facility], [address] [place], [place] [address]    Report Status PENDING  Incomplete      Radiology Studies last 3 days: DG Abd 1 View  Result Date: 02/27/2023 CLINICAL DATA:  Intractable vomiting EXAM: ABDOMEN - 1 VIEW COMPARISON:  Abdominal CT 01/17/2023 FINDINGS: Normal bowel gas pattern. No concerning mass effect or calcification. The lung bases are clear. Cholecystectomy and bilateral SFA stents. IMPRESSION: Normal bowel gas pattern. Electronically Signed   By: Tiburcio Pea M.D.   On: 02/27/2023 05:34   US RENAL  Result Date: 02/27/2023 CLINICAL DATA:  [id number].  Acute kidney injury. EXAM: RENAL / URINARY TRACT ULTRASOUND COMPLETE COMPARISON:  Renal ultrasound 04/27/2022, CT abdomen and pelvis no contrast 01/17/2023. FINDINGS: Right Kidney: Renal measurements: 8.5 x 4.4 x 4.2 cm = volume: 82.2 mL, previous  measurement 112.1 mL. Generalized cortical thinning is similar to prior study with again noted mild increased cortical echogenicity consistent with medical renal disease. No mass, stones or hydronephrosis  are seen. Pyelectasis is again noted without intrarenal caliectasis and appear similar to the prior exams. Probable extrarenal pelvis. Left Kidney: Renal measurements: 9.3 x 5.3 x 4.6 cm = volume: 119.3 mL, previously 109 mL. Generalized cortical thinning is noted on this side as well and mild increased cortical echogenicity consistent with medical renal disease. No mass, stones or hydronephrosis are seen. Bladder: Appears normal for degree of bladder distention. Bilateral ureteral jets are confirmed with color Doppler interrogation. Other: None. IMPRESSION: 1. Generalized cortical thinning and increased cortical echogenicity bilaterally consistent with medical renal disease, similar to prior study. 2. Right pyelectasis is again noted without intrarenal caliectasis and appears similar to prior exams. Probable extrarenal pelvis. 3. No focal bladder abnormality. Electronically Signed   By: Almira Bar M.D.   On: 02/27/2023 04:04             LOS: 1 day    Time spent: 35 min    Sunnie Nielsen, DO [facility] 02/28/2023, 3:31 PM    Dictation software may have been used to generate the above note. Typos may occur and escape review in typed/dictated notes. Please contact Dr Lyn Hollingshead directly for clarity if needed.  Staff may message me via secure chat in Epic  but this may not receive an immediate response,  please page me for urgent matters!  If 7PM-7AM, please contact night coverage [email or web address]

## 2023-02-28 NOTE — Progress Notes (Signed)
PHARMACIST - PHYSICIAN COMMUNICATION  DR:   Lyn Hollingshead  CONCERNING: IV to Oral Route Change Policy  RECOMMENDATION: This patient is receiving Protonix by the intravenous route.  Based on criteria approved by the Pharmacy and Therapeutics Committee, the intravenous medication(s) is/are being converted to the equivalent oral dose form(s).   DESCRIPTION: These criteria include: The patient is eating (either orally or via tube) and/or has been taking other orally administered medications for a least 24 hours The patient has no evidence of active gastrointestinal bleeding or impaired GI absorption (gastrectomy, short bowel, patient on TNA or NPO).  If you have questions about this conversion, please contact the Pharmacy Department   Barrie Folk, [facility] 02/28/2023 2:01 PM

## 2023-03-01 DIAGNOSIS — E11649 Type 2 diabetes mellitus with hypoglycemia without coma: Secondary | ICD-10-CM | POA: Diagnosis not present

## 2023-03-01 LAB — BASIC METABOLIC PANEL
Anion gap: 7 (ref 5–15)
BUN: 33 mg/dL — ABNORMAL HIGH (ref 8–23)
CO2: 26 mmol/L (ref 22–32)
Calcium: 8.3 mg/dL — ABNORMAL LOW (ref 8.9–10.3)
Chloride: 104 mmol/L (ref 98–111)
Creatinine, Ser: 1.73 mg/dL — ABNORMAL HIGH (ref 0.44–1.00)
GFR, Estimated: 31 mL/min — ABNORMAL LOW (ref >60)
Glucose, Bld: 196 mg/dL — ABNORMAL HIGH (ref 70–99)
Potassium: 5 mmol/L (ref 3.5–5.1)
Sodium: 137 mmol/L (ref 135–145)

## 2023-03-01 LAB — CBC
HCT: 28.6 % — ABNORMAL LOW (ref 36.0–46.0)
Hemoglobin: 9.5 g/dL — ABNORMAL LOW (ref 12.0–15.0)
MCH: 29 pg (ref 26.0–34.0)
MCHC: 33.2 g/dL (ref 30.0–36.0)
MCV: 87.2 fL (ref 80.0–100.0)
Platelets: 117 10*3/uL — ABNORMAL LOW (ref 150–400)
RBC: 3.28 MIL/uL — ABNORMAL LOW (ref 3.87–5.11)
RDW: 14.6 % (ref 11.5–15.5)
WBC: 5.6 10*3/uL (ref 4.0–10.5)
nRBC: 0 % (ref 0.0–0.2)

## 2023-03-01 LAB — GLUCOSE, CAPILLARY
Glucose-Capillary: 160 mg/dL — ABNORMAL HIGH (ref 70–99)
Glucose-Capillary: 189 mg/dL — ABNORMAL HIGH (ref 70–99)
Glucose-Capillary: 189 mg/dL — ABNORMAL HIGH (ref 70–99)
Glucose-Capillary: 244 mg/dL — ABNORMAL HIGH (ref 70–99)

## 2023-03-01 LAB — URINE CULTURE: Culture: >=100000 — AB

## 2023-03-01 MED ORDER — BASAGLAR KWIKPEN 100 UNIT/ML ~~LOC~~ SOPN
15.0000 [IU] | PEN_INJECTOR | Freq: Every day | SUBCUTANEOUS | 0 refills | Status: DC
Start: 2023-03-01 — End: 2023-04-24

## 2023-03-01 NOTE — Inpatient Diabetes Management (Signed)
Inpatient Diabetes Program Recommendations  AACE/ADA: New Consensus Statement on Inpatient Glycemic Control (2015)  Target Ranges:  Prepandial:   less than 140 mg/dL      Peak postprandial:   less than 180 mg/dL (1-2 hours)      Critically ill patients:  140 - 180 mg/dL   Lab Results  Component Value Date   GLUCAP 189 (H) 03/01/2023   HGBA1C 7.4 (H) 12/25/2022    Review of Glycemic Control  Latest Reference Range & Units 02/28/23 20:22 03/01/23 01:25 03/01/23 04:21 03/01/23 07:53  Glucose-Capillary 70 - 99 mg/dL 161 (H) 096 (H) 045 (H) 189 (H)  (H): Data is abnormally high  Diabetes history: DM  Outpatient Diabetes medications:  Mounjaro 2.5 mg weekly Metformin 500 mg bid Basaglar 24 units daily Glucotrol 5 mg bid Current orders for Inpatient glycemic control:  Semglee 15 units QD  Inpatient Diabetes Program Recommendations:    Might consider adding correction:  Novolog 0-9 units TID and 0-5 units at bedtime  Will continue to follow while inpatient.  Thank you, Dulce Sellar, MSN, CDCES Diabetes Coordinator Inpatient Diabetes Program [phone] (team pager from 8a-5p)

## 2023-03-01 NOTE — Progress Notes (Signed)
Cynthia Sims to be D/C'd Home per MD order.  Discussed prescriptions and follow up appointments with the patient. Prescriptions given to patient, medication list explained in detail. Pt verbalized understanding.  Allergies as of 03/01/2023   No Known Allergies      Medication List     STOP taking these medications    empagliflozin 10 MG Tabs tablet Commonly known as: JARDIANCE   glipiZIDE 10 MG tablet Commonly known as: GLUCOTROL   irbesartan 300 MG tablet Commonly known as: AVAPRO   metFORMIN 500 MG tablet Commonly known as: GLUCOPHAGE   Mounjaro 2.5 MG/0.5ML Pen Generic drug: tirzepatide       TAKE these medications    acetaminophen 500 MG tablet Commonly known as: TYLENOL Take 500 mg by mouth every 6 (six) hours as needed.   aspirin EC 81 MG tablet Take 81 mg by mouth daily.   Basaglar KwikPen 100 UNIT/ML Inject 15-30 Units into the skin daily. Measure fasting blood sugar every day: goal for now is to get this to 100-140. Increase daily Insulin dose by 2 units at a time, twice per week, until fasting sugars are consistently 100-140, then continue at that dose What changed: See the new instructions.   carvedilol 25 MG tablet Commonly known as: COREG Take 1 tablet by mouth twice daily   cholestyramine 4 g packet Commonly known as: QUESTRAN DISSOLVE AND TAKE ONE PACKET BY MOUTH THREE TIMES DAILY   clopidogrel 75 MG tablet Commonly known as: Plavix Take 1 tablet (75 mg total) by mouth daily.   cyanocobalamin 1000 MCG tablet Take 1,000 mcg by mouth daily.   DULoxetine 60 MG capsule Commonly known as: CYMBALTA Take 1 capsule by mouth once daily   gabapentin 100 MG capsule Commonly known as: NEURONTIN TAKE 1 CAPSULE BY MOUTH THREE TIMES DAILY   isosorbide mononitrate 30 MG 24 hr tablet Commonly known as: IMDUR Take 1 tablet (30 mg total) by mouth daily.   lovastatin 40 MG tablet Commonly known as: MEVACOR Take 1 tablet by mouth once daily    nitroGLYCERIN 0.4 MG SL tablet Commonly known as: NITROSTAT Place 1 tablet (0.4 mg total) under the tongue every 5 (five) minutes as needed for chest pain.   pantoprazole 40 MG tablet Commonly known as: Protonix Take 1 tablet (40 mg total) by mouth daily.               Durable Medical Equipment  (From admission, onward)           Start     Ordered   03/01/23 1418  For home use only DME Walker rolling  Once       Question Answer Comment  Walker: With 5 Inch Wheels   Patient needs a walker to treat with the following condition Weakness      03/01/23 1418            Vitals:   03/01/23 0753 03/01/23 1638  BP: (!) 146/53 (!) 107/57  Pulse: 82 72  Resp: 18 18  Temp: 98.2 F (36.8 C) 98.2 F (36.8 C)  SpO2: 100% 98%    Skin clean, dry and intact without evidence of skin break down, no evidence of skin tears noted. IV catheter discontinued intact. Site without signs and symptoms of complications. Dressing and pressure applied. Pt denies pain at this time. No complaints noted.  An After Visit Summary was printed and given to the patient. Patient escorted via WC, and D/C home via private auto.  Cruze Zingaro C. Jilda Roche

## 2023-03-01 NOTE — Discharge Summary (Signed)
Physician Discharge Summary   [name] MRN: [id number]  DOB: [birth year]   Admit:     Date of Admission: 02/26/2023 Admitted from: home   Discharge: Date of discharge: 03/01/23 Disposition: Home health Condition at discharge: good  CODE STATUS: FULL CODE     Discharge Physician: Sunnie Nielsen, DO [facility]     PCP: [name], MD  Recommendations for Outpatient Follow-up:  Follow up with PCP Simmons-Robinson, Tawanna Cooler, MD in 1 weeks Please obtain labs/tests: BMP, blood pressure eval and eval glucose readings in 1 weeks Please follow up on the following pending results: none PCP AND OTHER OUTPATIENT PROVIDERS: SEE BELOW FOR SPECIFIC DISCHARGE INSTRUCTIONS PRINTED FOR PATIENT IN ADDITION TO GENERIC AVS PATIENT INFO    Discharge Instructions     Diet Carb Modified   Complete by: As directed    Increase activity slowly   Complete by: As directed          Discharge Diagnoses: Principal Problem:   Hypoglycemia due to type 2 diabetes mellitus (HCC) Active Problems:   DM type 2 (diabetes mellitus, type 2) (HCC)   Hyperlipidemia associated with type 2 diabetes mellitus (HCC)   Acid reflux   Essential hypertension   Apnea, sleep   PAD (peripheral artery disease) (HCC)   Acute renal failure superimposed on stage 3b chronic kidney disease (HCC)   Anemia   Chronic kidney disease (CKD), stage III (moderate) (HCC)   Mild cognitive disorder   Thrombocytopenia (HCC)   Metabolic acidosis   UTI (urinary tract infection)   Urinary retention   Hypoglycemia associated with type 2 diabetes mellitus [facility])       Hospital Course: GENESYS Sims is a 72 y.o. female with medical history significant for DM2, CKD 3b, chronic urinary retention with regular self cath. Presented to ED w/ Hypoglycemia.  Pt has been weak and has had glucose in the 30's at home. Was started on jardiance early August and monjouro a few days ago. Also on  glipizide metformin.  09/16: admitted w/ hypoglycemia, AKI on CKD3b, metabolic acidosis. Suspect jardiance contributed to aki, suspect metformin also contributing to lactic acidosis. Renal u/s with pyelectasis likely 2/2 chronic urinary retention.  09/17: fluids w/ d5 bicarb to treat acidosis and hypoglycemia, monitor bmp q8h. Cr improving to 2.3. Dr Ashok Pall hospitalist d/w Dr. Lonna Cobb urology who advises foley for bladder decompression given the AKI and signs pyelectasis on renal u/s. Anticipate holding at a minimum jardiance, metformin, and glipizide at discharge. Glp-1s have been implicated in AKIs as well.  09/18: Cr improving 1.99. Glc climbing, restarting basal insulin today at lower than home dose (home 24 units, starting back at 15 units) stopped D5 fluids. If Glc normalized and ambulating well hopeful for d/c early tomorrow  09/19: renal function continues to improve, Glc mild hyperglycemia but stable for discharge to follow outpatient - will d/c antihyperglycemic meds except for long-acting insulin and have given instructions on titration up on this based on fasting Glc at home and follow w/ PCP for any changes   Consultants:  none  Procedures: none      ASSESSMENT & PLAN:   Hypoglycemia due to type 2 diabetes mellitus Poorly controlled DM2 w/ hypoglycemia  D/c metformin and glipizide Reduced insulin Home w/ 15 units basaglar daily to titrate up based on fasting Glc see instructions on med rec   AKI on CKD3b  complicated by urinary retention  Improving Renal US c/w medical renal disease, no hydronephrosis  D/c fluids today Montior BMP in am Foley catheter removed today   HLD Continue pravastatin   GERD Continue PPI  HTN HLD CAD PVD Continue carvedilol, Imdur, pravastatin, ASA< Plavix   OSA CPAP at bedtime   Mental Health Continue Cymbalta  Neuropathy Continue gabapentin            Discharge Instructions  Allergies as of 03/01/2023   No Known  Allergies      Medication List     STOP taking these medications    empagliflozin 10 MG Tabs tablet Commonly known as: JARDIANCE   glipiZIDE 10 MG tablet Commonly known as: GLUCOTROL   irbesartan 300 MG tablet Commonly known as: AVAPRO   metFORMIN 500 MG tablet Commonly known as: GLUCOPHAGE   Mounjaro 2.5 MG/0.5ML Pen Generic drug: tirzepatide       TAKE these medications    acetaminophen 500 MG tablet Commonly known as: TYLENOL Take 500 mg by mouth every 6 (six) hours as needed.   aspirin EC 81 MG tablet Take 81 mg by mouth daily.   Basaglar KwikPen 100 UNIT/ML Inject 15-30 Units into the skin daily. Measure fasting blood sugar every day: goal for now is to get this to 100-140. Increase daily Insulin dose by 2 units at a time, twice per week, until fasting sugars are consistently 100-140, then continue at that dose What changed: See the new instructions.   carvedilol 25 MG tablet Commonly known as: COREG Take 1 tablet by mouth twice daily   cholestyramine 4 g packet Commonly known as: QUESTRAN DISSOLVE AND TAKE ONE PACKET BY MOUTH THREE TIMES DAILY   clopidogrel 75 MG tablet Commonly known as: Plavix Take 1 tablet (75 mg total) by mouth daily.   cyanocobalamin 1000 MCG tablet Take 1,000 mcg by mouth daily.   DULoxetine 60 MG capsule Commonly known as: CYMBALTA Take 1 capsule by mouth once daily   gabapentin 100 MG capsule Commonly known as: NEURONTIN TAKE 1 CAPSULE BY MOUTH THREE TIMES DAILY   isosorbide mononitrate 30 MG 24 hr tablet Commonly known as: IMDUR Take 1 tablet (30 mg total) by mouth daily.   lovastatin 40 MG tablet Commonly known as: MEVACOR Take 1 tablet by mouth once daily   nitroGLYCERIN 0.4 MG SL tablet Commonly known as: NITROSTAT Place 1 tablet (0.4 mg total) under the tongue every 5 (five) minutes as needed for chest pain.   pantoprazole 40 MG tablet Commonly known as: Protonix Take 1 tablet (40 mg total) by mouth  daily.               Durable Medical Equipment  (From admission, onward)           Start     Ordered   03/01/23 1418  For home use only DME Walker rolling  Once       Question Answer Comment  Walker: With 5 Inch Wheels   Patient needs a walker to treat with the following condition Weakness      03/01/23 1418             Follow-up Information     Simmons-Robinson, Tawanna Cooler, MD. Schedule an appointment as soon as possible for a visit.   Specialty: Family Medicine Why: hospital follow up appointment in 1 week, following up for hypoglycemia and changed medications / insulin dosing, blood pressure check, kidney function labs Contact information: [address] [place] [address] [phone]  No Known Allergies   Subjective: pt reports doing well today, still feeling some weakness and using a walker which helps her feel more steady than er usual cane. No CP/SOB   Discharge Exam: BP (!) 146/53 (BP Location: Left Arm)   Pulse 82   Temp 98.2 F (36.8 C)   Resp 18   Wt 88.5 kg   SpO2 100%   BMI 36.87 kg/m  General: Pt is alert, awake, not in acute distress Cardiovascular: RRR, S1/S2 +, no rubs, no gallops Respiratory: CTA bilaterally, no wheezing, no rhonchi Abdominal: Soft, NT, ND, bowel sounds + Extremities: no edema, no cyanosis     The results of significant diagnostics from this hospitalization (including imaging, microbiology, ancillary and laboratory) are listed below for reference.     Microbiology: Recent Results (from the past 240 hour(s))  Urine Culture (for pregnant, neutropenic or urologic patients or patients with an indwelling urinary catheter)     Status: Abnormal   Collection Time: 02/26/23 11:33 PM   Specimen: In/Out Cath Urine  Result Value Ref Range Status   Specimen Description   Final    IN/OUT CATH URINE Performed at [facility], [address] [place], [place] [address]     Special Requests   Final    NONE Performed at [facility], [address] [place], [place] [address]    Culture (A)  Final    >=100,000 COLONIES/mL CITROBACTER SPECIES CITROBACTER WERKMANII 40,000 COLONIES/mL YEAST    Report Status 03/01/2023 FINAL  Final   Organism ID, Bacteria CITROBACTER SPECIES (A)  Final      Susceptibility   Citrobacter species - MIC*    CEFEPIME <=0.12 SENSITIVE Sensitive     CEFTRIAXONE <=0.25 SENSITIVE Sensitive     CIPROFLOXACIN <=0.25 SENSITIVE Sensitive     GENTAMICIN <=1 SENSITIVE Sensitive     IMIPENEM <=0.25 SENSITIVE Sensitive     NITROFURANTOIN <=16 SENSITIVE Sensitive     TRIMETH/SULFA >=320 RESISTANT Resistant     PIP/TAZO <=4 SENSITIVE Sensitive     * >=100,000 COLONIES/mL CITROBACTER SPECIES     Labs: BNP (last 3 results) Recent Labs    06/09/22 1532  BNP 21.9   Basic Metabolic Panel: Recent Labs  Lab 02/27/23 0655 02/27/23 1228 02/27/23 1941 02/28/23 0417 03/01/23 0421  NA 137 136 137 138 137  K 5.1 4.5 4.6 4.8 5.0  CL 113* 112* 109 108 104  CO2 16* 17* 20* 22 26  GLUCOSE 73 123* 167* 218* 196*  BUN 43* 35* 34* 30* 33*  CREATININE 2.53* 2.45* 2.30* 1.99* 1.73*  CALCIUM 8.7* 8.5* 8.3* 8.3* 8.3*   Liver Function Tests: Recent Labs  Lab 02/26/23 2227 02/27/23 0655  AST 14* 14*  ALT 10 10  ALKPHOS 78 74  BILITOT 0.5 0.3  PROT 6.8 6.7  ALBUMIN 3.6 3.6   No results for input(s): "LIPASE", "AMYLASE" in the last 168 hours. No results for input(s): "AMMONIA" in the last 168 hours. CBC: Recent Labs  Lab 02/26/23 2227 03/01/23 0421  WBC 9.8 5.6  NEUTROABS 7.9*  --   HGB 10.8* 9.5*  HCT 34.7* 28.6*  MCV 92.5 87.2  PLT 141* 117*   Cardiac Enzymes: No results for input(s): "CKTOTAL", "CKMB", "CKMBINDEX", "TROPONINI" in the last 168 hours. BNP: Invalid input(s): "POCBNP" CBG: Recent Labs  Lab 02/28/23 2022 03/01/23 0125 03/01/23 0421 03/01/23 0753 03/01/23 1119  GLUCAP 179* 160* 189* 189* 244*    D-Dimer No results for input(s): "DDIMER" in the last  72 hours. Hgb A1c No results for input(s): "HGBA1C" in the last 72 hours. Lipid Profile No results for input(s): "CHOL", "HDL", "LDLCALC", "TRIG", "CHOLHDL", "LDLDIRECT" in the last 72 hours. Thyroid function studies No results for input(s): "TSH", "T4TOTAL", "T3FREE", "THYROIDAB" in the last 72 hours.  Invalid input(s): "FREET3" Anemia work up No results for input(s): "VITAMINB12", "FOLATE", "FERRITIN", "TIBC", "IRON", "RETICCTPCT" in the last 72 hours. Urinalysis    Component Value Date/Time   COLORURINE YELLOW (A) 02/26/2023 2333   APPEARANCEUR HAZY (A) 02/26/2023 2333   APPEARANCEUR Hazy (A) 04/24/2022 0931   LABSPEC 1.013 02/26/2023 2333   PHURINE 5.0 02/26/2023 2333   GLUCOSEU >=500 (A) 02/26/2023 2333   HGBUR NEGATIVE 02/26/2023 2333   BILIRUBINUR NEGATIVE 02/26/2023 2333   BILIRUBINUR Negative 04/24/2022 0931   KETONESUR NEGATIVE 02/26/2023 2333   PROTEINUR NEGATIVE 02/26/2023 2333   UROBILINOGEN 0.2 02/24/2019 1123   NITRITE NEGATIVE 02/26/2023 2333   LEUKOCYTESUR LARGE (A) 02/26/2023 2333   Sepsis Labs Recent Labs  Lab 02/26/23 2227 03/01/23 0421  WBC 9.8 5.6   Microbiology Recent Results (from the past 240 hour(s))  Urine Culture (for pregnant, neutropenic or urologic patients or patients with an indwelling urinary catheter)     Status: Abnormal   Collection Time: 02/26/23 11:33 PM   Specimen: In/Out Cath Urine  Result Value Ref Range Status   Specimen Description   Final    IN/OUT CATH URINE Performed at [facility], [address], [place], [place] [address]    Special Requests   Final    NONE Performed at [facility], [address] [place], [place] [address]    Culture (A)  Final    >=100,000 COLONIES/mL CITROBACTER SPECIES CITROBACTER WERKMANII 40,000 COLONIES/mL YEAST    Report Status 03/01/2023 FINAL  Final   Organism ID, Bacteria CITROBACTER SPECIES (A)  Final       Susceptibility   Citrobacter species - MIC*    CEFEPIME <=0.12 SENSITIVE Sensitive     CEFTRIAXONE <=0.25 SENSITIVE Sensitive     CIPROFLOXACIN <=0.25 SENSITIVE Sensitive     GENTAMICIN <=1 SENSITIVE Sensitive     IMIPENEM <=0.25 SENSITIVE Sensitive     NITROFURANTOIN <=16 SENSITIVE Sensitive     TRIMETH/SULFA >=320 RESISTANT Resistant     PIP/TAZO <=4 SENSITIVE Sensitive     * >=100,000 COLONIES/mL CITROBACTER SPECIES   Imaging DG Abd 1 View  Result Date: 02/27/2023 CLINICAL DATA:  Intractable vomiting EXAM: ABDOMEN - 1 VIEW COMPARISON:  Abdominal CT 01/17/2023 FINDINGS: Normal bowel gas pattern. No concerning mass effect or calcification. The lung bases are clear. Cholecystectomy and bilateral SFA stents. IMPRESSION: Normal bowel gas pattern. Electronically Signed   By: Tiburcio Pea M.D.   On: 02/27/2023 05:34   US RENAL  Result Date: 02/27/2023 CLINICAL DATA:  [id number].  Acute kidney injury. EXAM: RENAL / URINARY TRACT ULTRASOUND COMPLETE COMPARISON:  Renal ultrasound 04/27/2022, CT abdomen and pelvis no contrast 01/17/2023. FINDINGS: Right Kidney: Renal measurements: 8.5 x 4.4 x 4.2 cm = volume: 82.2 mL, previous measurement 112.1 mL. Generalized cortical thinning is similar to prior study with again noted mild increased cortical echogenicity consistent with medical renal disease. No mass, stones or hydronephrosis are seen. Pyelectasis is again noted without intrarenal caliectasis and appear similar to the prior exams. Probable extrarenal pelvis. Left Kidney: Renal measurements: 9.3 x 5.3 x 4.6 cm = volume: 119.3 mL, previously 109 mL. Generalized cortical thinning is noted on this side as well and mild increased cortical echogenicity  consistent with medical renal disease. No mass, stones or hydronephrosis are seen. Bladder: Appears normal for degree of bladder distention. Bilateral ureteral jets are confirmed with color Doppler interrogation. Other: None. IMPRESSION: 1. Generalized  cortical thinning and increased cortical echogenicity bilaterally consistent with medical renal disease, similar to prior study. 2. Right pyelectasis is again noted without intrarenal caliectasis and appears similar to prior exams. Probable extrarenal pelvis. 3. No focal bladder abnormality. Electronically Signed   By: Almira Bar M.D.   On: 02/27/2023 04:04      Time coordinating discharge: over 30 minutes  SIGNED:  Sunnie Nielsen DO [facility]

## 2023-03-01 NOTE — TOC Initial Note (Signed)
Transition of Care [facility]) - Initial/Assessment Note    Patient Details  Name: Cynthia Sims MRN: [id number] Date of Birth: 06/10/1951  Transition of Care [facility]) CM/SW Contact:    Chapman Fitch, RN Phone Number: 03/01/2023, 2:24 PM  Clinical Narrative:                 Admitted EXB:[id number]  Admitted from: home with husband and granddaughter  PCP: Neita Garnet   MD has ordered home health and RW Patient in agreement.  States she does not have a preference of home health agency.  Referral made and accepted by [facility] with [facility]  Referral made to Jon with [employer] for RW to be delivered to room         Patient Goals and CMS Choice            Expected Discharge Plan and Services                                              Prior Living Arrangements/Services                       Activities of Daily Living      Permission Sought/Granted                  Emotional Assessment              Admission diagnosis:  Metabolic acidosis [E87.20] Hypoglycemia [E16.2] AKI (acute kidney injury) (HCC) [N17.9] Hypoglycemia associated with type 2 diabetes mellitus (HCC) [E11.649] Patient Active Problem List   Diagnosis Date Noted   Metabolic acidosis 02/27/2023   UTI (urinary tract infection) 02/27/2023   Urinary retention 02/27/2023   Hypoglycemia associated with type 2 diabetes mellitus (HCC) 02/27/2023   Hypoglycemia due to type 2 diabetes mellitus (HCC) 02/26/2023   Acute renal failure superimposed on stage 3b chronic kidney disease (HCC) 02/26/2023   Thrombocytopenia (HCC) 02/26/2023   Anemia    Atherosclerosis of native arteries of extremity with intermittent claudication (HCC) 02/19/2023   PAD (peripheral artery disease) (HCC) 12/25/2022   Hernia of abdominal cavity 12/25/2022   Moderate nonproliferative diabetic retinopathy (HCC) 10/17/2022   Hypertensive retinopathy of both eyes 10/17/2022   Claudication of both lower  extremities (HCC) 08/23/2022   Essential hypertension 06/09/2022   Diabetic neuropathy (HCC) 05/22/2022   Mild cognitive disorder 03/13/2019   Chronic diarrhea 03/13/2019   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 [place] [address] Phone: [phone] Fax: [phone]     Social Determinants of Health (SDOH) Social History: SDOH Screenings   Food Insecurity: No Food  Insecurity (10/04/2022)  Housing: Low Risk  (10/04/2022)  Transportation Needs: No Transportation Needs (10/04/2022)  Utilities: Not At Risk (10/04/2022)  Alcohol Screen: Low Risk  (10/04/2022)  Depression (PHQ2-9): Low Risk  (12/25/2022)  Financial Resource Strain: Low Risk  (10/04/2022)  Physical Activity: Inactive (10/04/2022)  Social Connections: Moderately Integrated (10/04/2022)  Stress: No Stress Concern Present (10/04/2022)  Tobacco Use: Low Risk  (02/19/2023)   SDOH Interventions:     Readmission Risk Interventions     No data to display

## 2023-03-02 ENCOUNTER — Other Ambulatory Visit: Payer: Self-pay | Admitting: Family Medicine

## 2023-03-02 ENCOUNTER — Telehealth: Payer: Self-pay

## 2023-03-02 DIAGNOSIS — E1142 Type 2 diabetes mellitus with diabetic polyneuropathy: Secondary | ICD-10-CM

## 2023-03-02 NOTE — Progress Notes (Signed)
Referral for endo [facility], Dr. Gershon Crane

## 2023-03-02 NOTE — Telephone Encounter (Signed)
Copied from CRM [id number]. Topic: Referral - Request for Referral >> Mar 01, 2023  3:18 PM Clide Dales wrote: Has patient seen PCP for this complaint? Yes.   *If NO, is insurance requiring patient see PCP for this issue before PCP can refer them? Referral for which specialty: Endocrinology Preferred provider/office: [facility], [place], Dr. Gershon Crane Reason for referral: Diabetes

## 2023-03-02 NOTE — Telephone Encounter (Signed)
Referral placed.

## 2023-03-05 ENCOUNTER — Telehealth: Payer: Self-pay | Admitting: *Deleted

## 2023-03-05 ENCOUNTER — Encounter: Payer: Self-pay | Admitting: *Deleted

## 2023-03-05 NOTE — Transitions of Care (Post Inpatient/ED Visit) (Signed)
03/05/2023  Name: Cynthia Sims MRN: [id number] DOB: Oct 10, 1950  Today's TOC FU Call Status: Today's TOC FU Call Status:: Successful TOC FU Call Completed TOC FU Call Complete Date: 03/05/23 Patient's Name and Date of Birth confirmed.  Transition Care Management Follow-up Telephone Call Date of Discharge: 03/01/23 Discharge Facility: [facility]) Type of Discharge: Inpatient Admission Primary Inpatient Discharge Diagnosis:: hypoglycemia How have you been since you were released from the hospital?: Better Any questions or concerns?: No  Items Reviewed: Did you receive and understand the discharge instructions provided?: Yes Medications obtained,verified, and reconciled?: Yes (Medications Reviewed) Any new allergies since your discharge?: No Dietary orders reviewed?: No Do you have support at home?: Yes People in Home: spouse Name of Support/Comfort Primary Source: [name]  Medications Reviewed Today: Medications Reviewed Today     Reviewed by Luella Cook, RN (Case Manager) on 03/05/23 at 1204  Med List Status: <None>   Medication Order Taking? Sig Documenting Provider Last Dose Status Informant  acetaminophen (TYLENOL) 500 MG tablet [id number] Yes Take 500 mg by mouth every 6 (six) hours as needed.  [provider] Taking Active Self           Med Note Lina Sayre   Tue Oct 24, 2022  7:12 AM) Patient states, "it's been several months."  aspirin EC 81 MG tablet [id number] Yes Take 81 mg by mouth daily.  [provider] Taking Active Self           Med Note Dorothe Pea Aug 02, 2017  8:59 AM)    carvedilol (COREG) 25 MG tablet [id number] Yes Take 1 tablet by mouth twice daily Mealor, Roberts Gaudy, MD Taking Active Self  cholestyramine (QUESTRAN) 4 g packet [id number] Yes DISSOLVE AND TAKE ONE PACKET BY MOUTH THREE TIMES DAILY Simmons-Robinson, Makiera, MD Taking Active Self  clopidogrel (PLAVIX) 75 MG tablet  [id number] Yes Take 1 tablet (75 mg total) by mouth daily. Schnier, Latina Craver, MD Taking Active Self  cyanocobalamin 1000 MCG tablet [id number] Yes Take 1,000 mcg by mouth daily. [provider] Taking Active Self  DULoxetine (CYMBALTA) 60 MG capsule [id number] Yes Take 1 capsule by mouth once daily Simmons-Robinson, Makiera, MD Taking Active Self  gabapentin (NEURONTIN) 100 MG capsule [id number] Yes TAKE 1 CAPSULE BY MOUTH THREE TIMES DAILY Simmons-Robinson, Makiera, MD Taking Active Self  Insulin Glargine (BASAGLAR KWIKPEN) 100 UNIT/ML [id number] Yes Inject 15-30 Units into the skin daily. Measure fasting blood sugar every day: goal for now is to get this to 100-140. Increase daily Insulin dose by 2 units at a time, twice per week, until fasting sugars are consistently 100-140, then continue at that dose Sunnie Nielsen, DO Taking Active   isosorbide mononitrate (IMDUR) 30 MG 24 hr tablet [id number] Yes Take 1 tablet (30 mg total) by mouth daily. Sharlene Dory, PA-C Taking Active Self  lovastatin (MEVACOR) 40 MG tablet [id number] Yes Take 1 tablet by mouth once daily Simmons-Robinson, Makiera, MD Taking Active Self  nitroGLYCERIN (NITROSTAT) 0.4 MG SL tablet [id number]  Place 1 tablet (0.4 mg total) under the tongue every 5 (five) minutes as needed for chest pain. Christell Constant, MD  Active Self           Med Note Lina Sayre   Tue Oct 24, 2022  7:15 AM) Patient states, "I have never used."  pantoprazole (PROTONIX) 40 MG tablet [id number] Yes Take 1 tablet (40 mg total) by mouth daily. Schnier,  Latina Craver, MD Taking Active Self            Home Care and Equipment/Supplies: Were Home Health Services Ordered?: Yes Name of Home Health Agency:: [facility] Has Agency set up a time to come to your home?: No EMR reviewed for Home Health Orders: Orders present/patient has not received call (refer to CM for follow-up) (RN gave patient number to follow up with [facility]) Any new equipment or  medical supplies ordered?: Yes Name of Medical supply agency?: [facility] Were you able to get the equipment/medical supplies?: Yes Do you have any questions related to the use of the equipment/supplies?: No  Functional Questionnaire: Do you need assistance with bathing/showering or dressing?: No Do you need assistance with meal preparation?: Yes Do you need assistance with eating?: No Do you have difficulty maintaining continence: No Do you need assistance with getting out of bed/getting out of a chair/moving?: No Do you have difficulty managing or taking your medications?: No  Follow up appointments reviewed: PCP Follow-up appointment confirmed?: Yes Date of PCP follow-up appointment?: 03/15/23 Follow-up Provider: DR [facility] Follow-up appointment confirmed?: NA Do you need transportation to your follow-up appointment?: No Do you understand care options if your condition(s) worsen?: Yes-patient verbalized understanding  SDOH Interventions Today    Flowsheet Row Most Recent Value  SDOH Interventions   Utilities Interventions Intervention Not Indicated       Goals Addressed             This Visit's Progress    TOC Care Plan        Current Barriers:  Knowledge Deficits related to plan of care for management of DMII  Chronic Disease Management support and education needs related to DMII  RNCM Clinical Goal(s):  Patient will verbalize understanding of plan for management of DMII as evidenced by decrease in A1C verbalize basic understanding of DMII disease process and self health management plan as evidenced by Monitoring blood sugars as per ordered take all medications exactly as prescribed and will call provider for medication related questions as evidenced by any high elevations or low blood sugars    attend all scheduled medical appointments: Consistency as evidenced by showing a decrease in your A1C        continue to work with RN Care Manager  and/or Social Worker to address care management and care coordination needs related to DMII as evidenced by adherence to CM Team Scheduled appointments       Interventions: Evaluation of current treatment plan related to  self management and patient's adherence to plan as established by provider  Patient Goals/Self-Care Activities: Participate in Transition of Care Program/Attend [facility] scheduled calls Notify RN Care Manager of TOC call rescheduling needs Take medications as prescribed   Attend all scheduled provider appointments Call provider office for new concerns or questions  keep appointment with eye doctor check blood sugar at prescribed times: before meals and at bedtime check feet daily for cuts, sores or redness enter blood sugar readings and medication or insulin into daily log take the blood sugar log to all doctor visits           Gean Maidens BSN RN [facility] [phone]

## 2023-03-06 ENCOUNTER — Other Ambulatory Visit: Payer: Self-pay | Admitting: Family Medicine

## 2023-03-06 DIAGNOSIS — N183 Chronic kidney disease, stage 3 unspecified: Secondary | ICD-10-CM

## 2023-03-06 DIAGNOSIS — E1122 Type 2 diabetes mellitus with diabetic chronic kidney disease: Secondary | ICD-10-CM

## 2023-03-06 NOTE — Telephone Encounter (Addendum)
Patient's daughter in law, Erie Noe, has called back and stated she called Dr Cherylann Ratel office and she states they have not received this referral. Advised patient's daughter in law that it is showing the referral was sent to Dr Leretha Dykes office at [facility] clinic but that I would send a message and have someone follow back up with her. Please call Erie Noe back with an update on this @ # [phone].

## 2023-03-07 ENCOUNTER — Encounter: Payer: Self-pay | Admitting: Family Medicine

## 2023-03-08 ENCOUNTER — Inpatient Hospital Stay: Payer: Medicare HMO | Admitting: Family Medicine

## 2023-03-09 ENCOUNTER — Other Ambulatory Visit: Payer: Self-pay | Admitting: Family Medicine

## 2023-03-09 DIAGNOSIS — R197 Diarrhea, unspecified: Secondary | ICD-10-CM

## 2023-03-12 ENCOUNTER — Ambulatory Visit: Payer: Self-pay | Admitting: *Deleted

## 2023-03-12 NOTE — Transitions of Care (Post Inpatient/ED Visit) (Signed)
03/12/2023  Name: Cynthia Sims MRN: [id number] DOB: 06-12-1951  Today's TOC FU Call Status: Today's TOC FU Call Status:: Successful TOC FU Call Completed Patient's Name and Date of Birth confirmed.  Transition Care Management Follow-up Telephone Call Date of Discharge: 03/01/23 Discharge Facility: [facility]) Type of Discharge: Inpatient Admission Primary Inpatient Discharge Diagnosis:: hypoglycemia How have you been since you were released from the hospital?: Better Any questions or concerns?: No  Items Reviewed: Did you receive and understand the discharge instructions provided?: Yes Medications obtained,verified, and reconciled?: Yes (Medications Reviewed) Any new allergies since your discharge?: No Dietary orders reviewed?: No Do you have support at home?: Yes People in Home: spouse Name of Support/Comfort Primary Source: [name]  Medications Reviewed Today: Medications Reviewed Today   Medications were not reviewed in this encounter     Home Care and Equipment/Supplies: Were Home Health Services Ordered?: Yes Name of Home Health Agency:: [facility] Has Agency set up a time to come to your home?: Yes Any new equipment or medical supplies ordered?: Yes Name of Medical supply agency?: [facility] Were you able to get the equipment/medical supplies?: Yes  Functional Questionnaire: Do you need assistance with bathing/showering or dressing?: No Do you need assistance with meal preparation?: Yes Do you need assistance with eating?: No Do you have difficulty maintaining continence: No Do you need assistance with getting out of bed/getting out of a chair/moving?: No Do you have difficulty managing or taking your medications?: No  Follow up appointments reviewed: PCP Follow-up appointment confirmed?: Yes Date of PCP follow-up appointment?: 03/15/23 Follow-up Provider: [facility] Follow-up appointment confirmed?: NA Do  you need transportation to your follow-up appointment?: No Do you understand care options if your condition(s) worsen?: Yes-patient verbalized understanding  Goals Addressed             This Visit's Progress    TOC Care Plan   On track     Current Barriers:  Knowledge Deficits related to plan of care for management of DMII  Chronic Disease Management support and education needs related to DMII  RNCM Clinical Goal(s):  Patient will verbalize understanding of plan for management of DMII as evidenced by decrease in A1C verbalize basic understanding of DMII disease process and self health management plan as evidenced by Monitoring blood sugars as per ordered take all medications exactly as prescribed and will call provider for medication related questions as evidenced by any high elevations or low blood sugars    attend all scheduled medical appointments: Consistency as evidenced by showing a decrease in your A1C        continue to work with RN Care Manager and/or Social Worker to address care management and care coordination needs related to DMII as evidenced by adherence to CM Team Scheduled appointments     demonstrate ongoing self health care management ability to monitor BS and manage diet as evidenced by     decrease in A1C  Interventions: Evaluation of current treatment plan related to  self management and patient's adherence to plan as established by provider   Patient Goals/Self-Care Activities: Participate in Transition of Care Program/Attend [facility] scheduled calls Notify RN Care Manager of TOC call rescheduling needs Take medications as prescribed   Attend all scheduled provider appointments Call pharmacy for medication refills 3-7 days in advance of running out of medications Call provider office for new concerns or questions  check blood sugar at prescribed times: before meals and at bedtime check feet  daily for cuts, sores or redness enter blood sugar readings and  medication or insulin into daily log take the blood sugar log to all doctor visits set a realistic goal   Next follow up appt [id number] 11 AM          Gean Maidens BSN RN [facility] [phone]

## 2023-03-13 ENCOUNTER — Other Ambulatory Visit: Payer: Self-pay | Admitting: Internal Medicine

## 2023-03-15 ENCOUNTER — Ambulatory Visit (INDEPENDENT_AMBULATORY_CARE_PROVIDER_SITE_OTHER): Payer: Medicare HMO | Admitting: Family Medicine

## 2023-03-15 ENCOUNTER — Encounter: Payer: Self-pay | Admitting: Family Medicine

## 2023-03-15 ENCOUNTER — Other Ambulatory Visit: Payer: Self-pay | Admitting: Internal Medicine

## 2023-03-15 VITALS — BP 132/68 | HR 61 | Ht 61.0 in | Wt 189.2 lb

## 2023-03-15 DIAGNOSIS — Z09 Encounter for follow-up examination after completed treatment for conditions other than malignant neoplasm: Secondary | ICD-10-CM

## 2023-03-15 DIAGNOSIS — I10 Essential (primary) hypertension: Secondary | ICD-10-CM | POA: Diagnosis not present

## 2023-03-15 DIAGNOSIS — Z7985 Long-term (current) use of injectable non-insulin antidiabetic drugs: Secondary | ICD-10-CM | POA: Diagnosis not present

## 2023-03-15 DIAGNOSIS — N178 Other acute kidney failure: Secondary | ICD-10-CM

## 2023-03-15 DIAGNOSIS — N179 Acute kidney failure, unspecified: Secondary | ICD-10-CM | POA: Diagnosis not present

## 2023-03-15 DIAGNOSIS — N1832 Chronic kidney disease, stage 3b: Secondary | ICD-10-CM

## 2023-03-15 DIAGNOSIS — E11649 Type 2 diabetes mellitus with hypoglycemia without coma: Secondary | ICD-10-CM | POA: Diagnosis not present

## 2023-03-15 DIAGNOSIS — E1122 Type 2 diabetes mellitus with diabetic chronic kidney disease: Secondary | ICD-10-CM

## 2023-03-15 DIAGNOSIS — Z23 Encounter for immunization: Secondary | ICD-10-CM

## 2023-03-15 MED ORDER — IRBESARTAN 75 MG PO TABS
75.0000 mg | ORAL_TABLET | Freq: Every day | ORAL | 2 refills | Status: DC
Start: 2023-03-15 — End: 2023-08-20

## 2023-03-15 NOTE — Addendum Note (Signed)
Addended by: Rolly Salter on: 03/15/2023 11:12 AM   Modules accepted: Orders

## 2023-03-15 NOTE — Patient Instructions (Signed)
VISIT SUMMARY:  During your visit, we discussed your recent hospitalization due to low blood sugar and kidney injury. You've been feeling well since discharge, but have noticed some fluctuations in your blood sugar levels. You've also experienced a feeling of instability when standing up. We've made some changes to your medications since your hospital stay, and you've decided to continue taking Mounjaro on your own.  YOUR PLAN:  -DIABETES: Diabetes is a condition where your body doesn't properly process sugar. We will continue your current medication regimen, including Mounjaro and Basaglar insulin. You should increase your insulin dosage by 2 units every 3 days until your fasting blood sugar is 150 or less, up to a maximum of 30 units daily. We will also check your kidney function today, and if it's stable or improving, we will restart your Metformin medication.  -HIGH BLOOD PRESSURE: High blood pressure, or hypertension, is a condition where the force of blood against your artery walls is too high. We will check your kidney function today, and if it's stable or improving, we will restart your Irbesartan medication at a reduced dose.  -GENERAL HEALTH: For your overall health, we will administer the influenza vaccine today if it's available. We've also scheduled a follow-up appointment for you on April 27, 2023.  INSTRUCTIONS:  Please continue to monitor your blood sugar levels and blood pressure at home. Remember to increase your insulin dosage by 2 units every 3 days until your fasting blood sugar is 150 or less, up to a maximum of 30 units daily. If you have any concerns or notice any changes in your health, please contact our office immediately.

## 2023-03-15 NOTE — Progress Notes (Signed)
Established patient visit   [name]   DOB: July 06, 1950   72 y.o. Female  MRN: [id number] Visit Date: 03/15/2023  Today's healthcare provider: [name], MD   Chief Complaint  Patient presents with   Hospitalization Follow-up    No new concerns today, feels better today    Subjective     HPI     Hospitalization Follow-up    Additional comments: No new concerns today, feels better today       Last edited by Rolly Salter, CMA on 03/15/2023 10:06 AM.       Discussed the use of AI scribe software for clinical note transcription with the patient, who gave verbal consent to proceed.  History of Present Illness   The patient, with a history of diabetes, presented for a follow-up visit after a recent hospitalization due to hypoglycemia and acute kidney injury (AKI). The patient reported feeling generally well since discharge, but noted fluctuations in blood glucose levels. Despite discontinuation of all oral hypoglycemic agents in the hospital, the patient self-reinstated Mounjaro, suspecting it was not the cause of the hypoglycemic episode. The patient's fasting blood glucose levels have ranged from 151 to 194 mg/dL since discharge.  During the hospital stay, the patient's blood glucose level had dropped to a concerning 23 mg/dL, prompting the medical team to discontinue all oral hypoglycemic agents and manage the patient's diabetes solely with insulin.  The patient also reported a transient feeling of instability upon standing, described as a sensation of wobbliness in the legs rather than dizziness or lightheadedness. The patient's blood pressure readings have been variable, with a recent reading of 142/68 mmHg.  The patient's medication regimen has been adjusted since the hospitalization, with discontinuation of Jardiance, Glipizide, Irbesartan, and Metformin. However, the patient self-reinstated Mounjaro, believing it was not the cause of the  hypoglycemic episode. The patient is also on Aspirin, Carvedilol, and Protonix.      Chart Review for ED/Admission Follow up Pt evaluated in ED on 9/16 and admitted for AKI and hypoglycemia, discharged 03/01/23 Medication reconciliation/changes include stopping metformin, mounjaro and glipizide She has been managing glucose with insulin 15 units, has been taking     Past Medical History:  Diagnosis Date   ALT (SGPT) level raised    Anemia    Anxiety    Arthritis    Depression    Diabetes mellitus without complication (HCC)    Elevated cholesterol    GERD (gastroesophageal reflux disease)    Hemorrhoids    Hypertension    Kidney disease     Medications: Outpatient Medications Prior to Visit  Medication Sig   acetaminophen (TYLENOL) 500 MG tablet Take 500 mg by mouth every 6 (six) hours as needed.    aspirin EC 81 MG tablet Take 81 mg by mouth daily.    carvedilol (COREG) 25 MG tablet Take 1 tablet by mouth twice daily   cholestyramine (QUESTRAN) 4 g packet DISSOLVE AND TAKE ONE PACKET BY MOUTH THREE TIMES A DAY   clopidogrel (PLAVIX) 75 MG tablet Take 1 tablet (75 mg total) by mouth daily.   cyanocobalamin 1000 MCG tablet Take 1,000 mcg by mouth daily.   DULoxetine (CYMBALTA) 60 MG capsule Take 1 capsule by mouth once daily   gabapentin (NEURONTIN) 100 MG capsule TAKE 1 CAPSULE BY MOUTH THREE TIMES DAILY   Insulin Glargine (BASAGLAR KWIKPEN) 100 UNIT/ML Inject 15-30 Units into the skin daily. Measure fasting blood sugar every day: goal for  now is to get this to 100-140. Increase daily Insulin dose by 2 units at a time, twice per week, until fasting sugars are consistently 100-140, then continue at that dose   isosorbide mononitrate (IMDUR) 30 MG 24 hr tablet Take 1 tablet (30 mg total) by mouth daily.   lovastatin (MEVACOR) 40 MG tablet Take 1 tablet by mouth once daily   nitroGLYCERIN (NITROSTAT) 0.4 MG SL tablet Place 1 tablet (0.4 mg total) under the tongue every 5 (five)  minutes as needed for chest pain.   pantoprazole (PROTONIX) 40 MG tablet Take 1 tablet (40 mg total) by mouth daily.   No facility-administered medications prior to visit.    Review of Systems  Last metabolic panel Lab Results  Component Value Date   GLUCOSE 196 (H) 03/01/2023   NA 137 03/01/2023   K 5.0 03/01/2023   CL 104 03/01/2023   CO2 26 03/01/2023   BUN 33 (H) 03/01/2023   CREATININE 1.73 (H) 03/01/2023   GFRNONAA 31 (L) 03/01/2023   CALCIUM 8.3 (L) 03/01/2023   PROT 6.7 02/27/2023   ALBUMIN 3.6 02/27/2023   LABGLOB 2.6 06/09/2022   AGRATIO 1.6 06/09/2022   BILITOT 0.3 02/27/2023   ALKPHOS 74 02/27/2023   AST 14 (L) 02/27/2023   ALT 10 02/27/2023   ANIONGAP 7 03/01/2023        Objective    BP 132/68   Pulse 61   Ht 5\' 1"  (1.549 m)   Wt 189 lb 3.2 oz (85.8 kg)   SpO2 100%   BMI 35.75 kg/m   BP Readings from Last 3 Encounters:  03/15/23 132/68  03/01/23 (!) 107/57  02/19/23 116/79   Wt Readings from Last 3 Encounters:  03/15/23 189 lb 3.2 oz (85.8 kg)  03/01/23 195 lb 1.7 oz (88.5 kg)  02/22/23 194 lb (88 kg)       Physical Exam Vitals reviewed.  Constitutional:      General: She is not in acute distress.    Appearance: Normal appearance. She is not ill-appearing, toxic-appearing or diaphoretic.  Eyes:     Conjunctiva/sclera: Conjunctivae normal.  Cardiovascular:     Rate and Rhythm: Normal rate and regular rhythm.     Pulses: Normal pulses.     Heart sounds: Normal heart sounds. No murmur heard.    No friction rub. No gallop.  Pulmonary:     Effort: Pulmonary effort is normal. No respiratory distress.     Breath sounds: Normal breath sounds. No stridor. No wheezing, rhonchi or rales.  Abdominal:     General: Bowel sounds are normal. There is no distension.     Palpations: Abdomen is soft.     Tenderness: There is no abdominal tenderness.  Musculoskeletal:     Right lower leg: No edema.     Left lower leg: No edema.  Skin:     Findings: No erythema or rash.  Neurological:     Mental Status: She is alert and oriented to person, place, and time.       No results found for any visits on 03/15/23.  Assessment & Plan     Problem List Items Addressed This Visit     Acute renal failure superimposed on stage 3b chronic kidney disease [facility])    Hospital follow up from admission 02/26/23-03/01/23  Reviewed medications  Will plan to resume mounjaro for now, will obtain BMP and resume irbesartan at 75mg  dose along with metformin if creatinine remains improved or at baseline  Pt  has endo consult scheduled for 04/02/23        DM type 2 (diabetes mellitus, type 2) (HCC) - Primary    Recent hospitalization for hypoglycemia and acute kidney injury. Current fasting blood glucose levels fluctuating between 150-194. Patient self-restarted Mounjaro 2.5mg  weekly and increased Basaglar insulin to 24 units daily. Chronic  Lab Results  Component Value Date   HGBA1C 7.4 (H) 12/25/2022    -Continue Mounjaro 2.5mg  weekly and Basaglar insulin.     -Continue self-titration of Basaglar insulin by increasing 2 units every 3 days until fasting blood glucose is 150 or less, up to a maximum of 30 units daily. -Check basic metabolic panel today to assess kidney function. -If kidney function is stable or improving, restart Metformin 500mg  twice daily       Relevant Medications   irbesartan (AVAPRO) 75 MG tablet   Essential hypertension    Blood pressure readings of 135/58 and 142/68. Patient was previously on Irbesartan 300mg  daily, which was stopped during recent hospitalization. Chronic, controlled -Check basic metabolic panel today to assess kidney function. -If kidney function is stable or improving, restart Irbesartan at a reduced dose of 75mg  daily,prescription sent to pharmacy        Relevant Medications   irbesartan (AVAPRO) 75 MG tablet   Other Relevant Orders   Basic Metabolic Panel (BMET)   Other Visit Diagnoses      Hospital discharge follow-up       AKI (acute kidney injury) (HCC)       Relevant Orders   Basic Metabolic Panel (BMET)          General Health Maintenance -Administer influenza vaccine today if available.      Return in about 6 weeks (around 04/27/2023) for as scheduled .       [name], MD  [facility] [phone] (phone) [phone] (fax)  [facility]

## 2023-03-15 NOTE — Assessment & Plan Note (Signed)
Blood pressure readings of 135/58 and 142/68. Patient was previously on Irbesartan 300mg  daily, which was stopped during recent hospitalization. Chronic, controlled -Check basic metabolic panel today to assess kidney function. -If kidney function is stable or improving, restart Irbesartan at a reduced dose of 75mg  daily,prescription sent to pharmacy

## 2023-03-15 NOTE — Assessment & Plan Note (Signed)
Hospital follow up from admission 02/26/23-03/01/23  Reviewed medications  Will plan to resume mounjaro for now, will obtain BMP and resume irbesartan at 75mg  dose along with metformin if creatinine remains improved or at baseline  Pt has endo consult scheduled for 04/02/23

## 2023-03-15 NOTE — Assessment & Plan Note (Signed)
Recent hospitalization for hypoglycemia and acute kidney injury. Current fasting blood glucose levels fluctuating between 150-194. Patient self-restarted Mounjaro 2.5mg  weekly and increased Basaglar insulin to 24 units daily. Chronic  Lab Results  Component Value Date   HGBA1C 7.4 (H) 12/25/2022    -Continue Mounjaro 2.5mg  weekly and Basaglar insulin.     -Continue self-titration of Basaglar insulin by increasing 2 units every 3 days until fasting blood glucose is 150 or less, up to a maximum of 30 units daily. -Check basic metabolic panel today to assess kidney function. -If kidney function is stable or improving, restart Metformin 500mg  twice daily

## 2023-03-16 ENCOUNTER — Encounter: Payer: Self-pay | Admitting: Pharmacist

## 2023-03-16 LAB — BASIC METABOLIC PANEL
BUN/Creatinine Ratio: 13 (ref 12–28)
BUN: 19 mg/dL (ref 8–27)
CO2: 19 mmol/L — ABNORMAL LOW (ref 20–29)
Calcium: 8.9 mg/dL (ref 8.7–10.3)
Chloride: 106 mmol/L (ref 96–106)
Creatinine, Ser: 1.52 mg/dL — ABNORMAL HIGH (ref 0.57–1.00)
Glucose: 150 mg/dL — ABNORMAL HIGH (ref 70–99)
Potassium: 4.7 mmol/L (ref 3.5–5.2)
Sodium: 140 mmol/L (ref 134–144)
eGFR: 36 mL/min/{1.73_m2} — ABNORMAL LOW (ref >59)

## 2023-03-20 ENCOUNTER — Ambulatory Visit: Payer: Self-pay | Admitting: *Deleted

## 2023-03-20 NOTE — Patient Outreach (Signed)
Care Management  Transitions of Care Program Transitions of Care Post-discharge week 2   03/20/2023 Name: Cynthia Sims MRN: [id number] DOB: 03-29-1951  Subjective: Cynthia Sims is a 72 y.o. year old female who is a primary care patient of Simmons-Robinson, Tawanna Cooler, MD. The Care Management team Engaged with patient Engaged with patient by telephone to assess and address transitions of care needs.   Consent to Services:  Patient declined any additional outreach calls  Assessment:     Fasting blood sugar is 120. Patient is trying to eat healthier. She is walking around the house to increase exercise.       SDOH Interventions    Flowsheet Row Telephone from 03/05/2023 in [facility] Care Coordination Clinical Support from 10/04/2022 in [facility] Office Visit from 05/22/2022 in [facility] Practice Office Visit from 02/20/2022 in [facility] Office Visit from 04/13/2021 in [facility] Clinical Support from 08/22/2018 in [facility]  SDOH Interventions        Food Insecurity Interventions -- Intervention Not Indicated -- -- -- --  Housing Interventions -- Intervention Not Indicated -- -- -- --  Transportation Interventions -- Intervention Not Indicated -- -- -- --  Utilities Interventions Intervention Not Indicated Intervention Not Indicated -- -- -- --  Alcohol Usage Interventions -- Intervention Not Indicated (Score <7) -- -- -- --  Depression Interventions/Treatment  -- -- Currently on Treatment PHQ2-9 Score <4 Follow-up Not Indicated PHQ2-9 Score <4 Follow-up Not Indicated PHQ2-9 Score <4 Follow-up Not Indicated  Financial Strain Interventions -- Intervention Not Indicated -- -- -- --  Physical Activity Interventions -- Intervention Not Indicated -- -- -- --  Stress Interventions -- Intervention Not Indicated -- -- -- --  Social  Connections Interventions -- Intervention Not Indicated -- -- -- --        Goals Addressed             This Visit's Progress    TOC Care Plan   On track     Current Barriers:  Knowledge Deficits related to plan of care for management of DMII  Chronic Disease Management support and education needs related to DMII  RNCM Clinical Goal(s):  Patient will verbalize understanding of plan for management of DMII as evidenced by decrease in A1C verbalize basic understanding of DMII disease process and self health management plan as evidenced by Monitoring blood sugars as per ordered take all medications exactly as prescribed and will call provider for medication related questions as evidenced by any high elevations or low blood sugars    attend all scheduled medical appointments: Consistency as evidenced by showing a decrease in your A1C        continue to work with RN Care Manager and/or Social Worker to address care management and care coordination needs related to DMII as evidenced by adherence to CM Team Scheduled appointments     demonstrate ongoing self health care management ability to monitor BS and manage diet as evidenced by     decrease in A1C  Interventions: Evaluation of current treatment plan related to  self management and patient's adherence to plan as established by provider   Patient Goals/Self-Care Activities: Take medications as prescribed   Attend all scheduled provider appointments Call pharmacy for medication refills 3-7 days in advance of running out of medications Call provider office for new concerns or questions  check blood sugar at prescribed times: three  times daily check feet daily for cuts, sores or redness enter blood sugar readings and medication or insulin into daily log take the blood sugar log to all doctor visits set a realistic goal   Patient has declined any additional outreach calls        Plan: The patient has been provided with contact  information for the care management team and has been advised to call with any health related questions or concerns.  Patient declined any further outreach calls. She feels she is able to manage and understand what she needs to do.   Gean Maidens BSN RN [facility] [phone]

## 2023-03-30 DIAGNOSIS — E1142 Type 2 diabetes mellitus with diabetic polyneuropathy: Secondary | ICD-10-CM | POA: Diagnosis not present

## 2023-04-12 ENCOUNTER — Other Ambulatory Visit: Payer: Self-pay | Admitting: Internal Medicine

## 2023-04-16 NOTE — Telephone Encounter (Signed)
Endocrinology increased dose to 0.5mg  a few days ago

## 2023-04-17 ENCOUNTER — Other Ambulatory Visit (INDEPENDENT_AMBULATORY_CARE_PROVIDER_SITE_OTHER): Payer: Self-pay | Admitting: Vascular Surgery

## 2023-04-23 ENCOUNTER — Other Ambulatory Visit: Payer: Self-pay | Admitting: Family Medicine

## 2023-04-23 DIAGNOSIS — E0842 Diabetes mellitus due to underlying condition with diabetic polyneuropathy: Secondary | ICD-10-CM

## 2023-04-27 ENCOUNTER — Ambulatory Visit (INDEPENDENT_AMBULATORY_CARE_PROVIDER_SITE_OTHER): Payer: Medicare HMO | Admitting: Family Medicine

## 2023-04-27 ENCOUNTER — Encounter: Payer: Self-pay | Admitting: Family Medicine

## 2023-04-27 VITALS — BP 87/53 | HR 72 | Temp 97.7°F | Resp 18 | Ht 61.0 in | Wt 171.6 lb

## 2023-04-27 DIAGNOSIS — R112 Nausea with vomiting, unspecified: Secondary | ICD-10-CM | POA: Diagnosis not present

## 2023-04-27 DIAGNOSIS — E11649 Type 2 diabetes mellitus with hypoglycemia without coma: Secondary | ICD-10-CM

## 2023-04-27 DIAGNOSIS — I739 Peripheral vascular disease, unspecified: Secondary | ICD-10-CM

## 2023-04-27 DIAGNOSIS — G4733 Obstructive sleep apnea (adult) (pediatric): Secondary | ICD-10-CM | POA: Diagnosis not present

## 2023-04-27 DIAGNOSIS — E1169 Type 2 diabetes mellitus with other specified complication: Secondary | ICD-10-CM

## 2023-04-27 DIAGNOSIS — N183 Chronic kidney disease, stage 3 unspecified: Secondary | ICD-10-CM | POA: Diagnosis not present

## 2023-04-27 DIAGNOSIS — K219 Gastro-esophageal reflux disease without esophagitis: Secondary | ICD-10-CM

## 2023-04-27 DIAGNOSIS — E785 Hyperlipidemia, unspecified: Secondary | ICD-10-CM | POA: Diagnosis not present

## 2023-04-27 DIAGNOSIS — E1142 Type 2 diabetes mellitus with diabetic polyneuropathy: Secondary | ICD-10-CM | POA: Diagnosis not present

## 2023-04-27 DIAGNOSIS — F419 Anxiety disorder, unspecified: Secondary | ICD-10-CM

## 2023-04-27 DIAGNOSIS — R310 Gross hematuria: Secondary | ICD-10-CM

## 2023-04-27 DIAGNOSIS — I10 Essential (primary) hypertension: Secondary | ICD-10-CM

## 2023-04-27 MED ORDER — ONDANSETRON 4 MG PO TBDP
4.0000 mg | ORAL_TABLET | Freq: Three times a day (TID) | ORAL | 0 refills | Status: AC | PRN
Start: 2023-04-27 — End: ?

## 2023-04-27 NOTE — Assessment & Plan Note (Signed)
  Chronic condition with recent hypoglycemia episodes. A1c was 7.2. Blood glucose levels have been fluctuating, with recent readings of 143 mg/dL and occasional highs up to 200 mg/dL postprandially. Managed with metformin 500 mg twice daily, Mounjaro 5 mg weekly, and Basaglar 24 units at night. Discussed the importance of monitoring blood glucose levels and adjusting Basaglar dosage accordingly to prevent hypoglycemia. Informed about the potential side effects of Mounjaro, including nausea. - Continue metformin 500 mg twice daily - Continue Mounjaro 5 mg weekly - Continue Basaglar 24 units at night, decrease to 20 units if blood glucose < 105 mg/dL, and further decrease to 16 units if levels remain low - Prescribe Zofran 4 mg disintegrating tablets for nausea - follow up with Dr. Gershon Crane as scheduled

## 2023-04-27 NOTE — Assessment & Plan Note (Signed)
Chronic  Stable with CPAP use  Patient advised to continue CPAP nightly  The patient has had significant benefit from the use of CPAP machine with considerable improvements in quality of life, work production and decrease medical complaints.  The patient will need continued maintenance and care CPAP supplies (including upgrades as deemed appropriate) in order to continue treatment for sleep apnea as this is medically necessary.     

## 2023-04-27 NOTE — Assessment & Plan Note (Signed)
  Generalized Anxiety Disorder,chronic, stable Managed with Cymbalta. - Continue Cymbalta 60 mg daily

## 2023-04-27 NOTE — Assessment & Plan Note (Signed)
Hypertension Usually well-controlled. Recent BMP showed stable creatinine at 1.52. Last GFR was 33 (May 2024). Blood pressure was low at 95/50 mmHg during a recent dental visit, likely due to weight loss. Discussed the need to monitor blood pressure regularly and adjust medications if necessary. - Continue Coreg 25 mg twice daily - Confirm irbesartan dosage (75 mg)

## 2023-04-27 NOTE — Assessment & Plan Note (Signed)
  Peripheral Artery Disease (PAD) chronic Managed with Plavix and aspirin. Atherosclerosis of native arteries of extremities with intermittent claudication. - Continue Plavix 75 mg daily - Continue aspirin 81 mg daily

## 2023-04-27 NOTE — Assessment & Plan Note (Signed)
Appears to have more higher glucose measurements than lows  Managed by Endocrinology

## 2023-04-27 NOTE — Assessment & Plan Note (Signed)
  Diabetic Neuropathy Managed with gabapentin 100 mg three times daily. - Continue gabapentin 100 mg three times daily

## 2023-04-27 NOTE — Assessment & Plan Note (Signed)
  Chronic Kidney Disease Stage 3B Condition with creatinine at 1.52 and GFR of 33 as of May 2024. - Monitor renal function regularly

## 2023-04-27 NOTE — Patient Instructions (Signed)
VISIT SUMMARY:  Cynthia Sims, you had a follow-up appointment today to review your chronic conditions, including type 2 diabetes, sleep apnea, anxiety, hypertension, chronic kidney disease, and peripheral artery disease. We discussed your recent blood glucose levels, weight loss, and episodes of nausea and vomiting. Your current medications and their dosages were reviewed, and adjustments were made where necessary.  YOUR PLAN:  -TYPE 2 DIABETES MELLITUS WITH HYPOGLYCEMIA: Type 2 diabetes is a condition where your body does not use insulin properly, leading to high blood sugar levels. We discussed the importance of monitoring your blood glucose levels and adjusting your Basaglar dosage to prevent low blood sugar. Continue taking metformin 500 mg twice daily, Mounjaro 5 mg weekly, and Basaglar 24 units at night. If your blood glucose is less than 105 mg/dL, decrease Basaglar to 20 units, and further decrease to 16 units if levels remain low. Zofran 4 mg disintegrating tablets were prescribed for nausea.  -DIABETIC NEUROPATHY: Diabetic neuropathy is nerve damage caused by high blood sugar levels. Continue taking gabapentin 100 mg three times daily to manage this condition.  -HYPERTENSION: Hypertension is high blood pressure, which can lead to other health problems. Your blood pressure was low recently, likely due to weight loss. Continue taking Coreg 25 mg twice daily, and we will confirm your irbesartan dosage.  -CHRONIC KIDNEY DISEASE STAGE 3B: Chronic kidney disease is a gradual loss of kidney function. Your creatinine level is 1.52, and your GFR is 33. We will continue to monitor your kidney function regularly.  -HYPERLIPIDEMIA: Hyperlipidemia is having high levels of fats in your blood, which can increase the risk of heart disease. Continue taking bolastatin 40 mg daily, and we will confirm your irbesartan dosage.  -PERIPHERAL ARTERY DISEASE (PAD): PAD is a condition where the arteries in your legs  are narrowed, causing reduced blood flow. Continue taking Plavix 75 mg daily and aspirin 81 mg daily.  -CHRONIC DIARRHEA: Chronic diarrhea is frequent, loose, or watery bowel movements. Continue taking colic Systane 4 g packets three times daily.  -GASTROESOPHAGEAL REFLUX DISEASE (GERD): GERD is a condition where stomach acid frequently flows back into the tube connecting your mouth and stomach. Continue taking Protonix 40 mg daily.  -OBSTRUCTIVE SLEEP APNEA: Obstructive sleep apnea is a condition where your breathing stops and starts during sleep. Continue using your CPAP machine as prescribed.  -GENERALIZED ANXIETY DISORDER: Generalized anxiety disorder is excessive, ongoing anxiety and worry. Continue taking Cymbalta 60 mg daily.  -GENERAL HEALTH MAINTENANCE: Maintain a healthy diet and regular physical activity. Regularly monitor your blood pressure and blood glucose levels.  INSTRUCTIONS:  Please schedule a follow-up appointment with your primary care physician and follow up with endocrinology as needed. Additionally, perform a urine microscopy to check for blood cells.

## 2023-04-27 NOTE — Assessment & Plan Note (Signed)
Chronic  Lab Results  Component Value Date   CHOL 131 12/25/2022   HDL 58 12/25/2022   LDLCALC 45 12/25/2022   TRIG 168 (H) 12/25/2022   CHOLHDL 2.3 12/25/2022    - Continue statin  40 mg daily

## 2023-04-27 NOTE — Assessment & Plan Note (Signed)
  Gastroesophageal Reflux Disease (GERD) Managed with Protonix. Chronic  Stable  - Continue Protonix 40 mg daily

## 2023-04-27 NOTE — Progress Notes (Signed)
Established patient visit   [name]   DOB: 1951/04/08   72 y.o. Female  MRN: [id number] Visit Date: 04/27/2023  Today's healthcare provider: [name], MD   Chief Complaint  Patient presents with   Medical Management of Chronic Issues   Subjective       Discussed the use of AI scribe software for clinical note transcription with the patient, who gave verbal consent to proceed.  History of Present Illness   Cynthia Sims, a 72 year old patient with a history of type 2 diabetes, sleep apnea, anxiety, central hypertension, chronic kidney disease stage 3B, and atherosclerosis of native arteries of extremities with intermittent claudication, presents for a chronic follow-up. The patient's diabetes is managed with metformin 500mg  twice daily, Mounjaro 5mg  weekly, and Basaglar 24 units at night. The patient was recently advised to adjust the Basaglar dosage if consistent blood glucose levels are less than 105.  The patient's sleep apnea is stable and managed with a CPAP machine. Anxiety is also stable and managed with Cymbalta 60mg . Hypertension is usually well controlled with Coreg 25mg  twice daily. The patient's creatinine level was 1.52 as of October 2024, consistent with her chronic kidney disease.  The patient is also on bolastatin 40mg  daily for cholesterol management and irbesartan for hypertension, though the dosage needs clarification. The patient takes colic Systane 4g packets three times a day for chronic diarrhea and gabapentin 100mg  three times daily for diabetic neuropathy.  The patient has been diagnosed with peripheral artery disease and is on nitroglycerin, Protonix 40mg  for gastric reflux, Plavix 75mg , and aspirin 80mg .  The patient has recently experienced weight loss and has been using a Dexcom device to monitor blood glucose levels. The patient reports morning blood glucose levels around 143 and postprandial levels up to 200. The patient  has also reported episodes of nausea and vomiting, with vomitus appearing yellow. The patient also reported a single episode of pale pink discharge in a mini pad, with no associated pain. The patient denies any new medications.       Past Medical History:  Diagnosis Date   ALT (SGPT) level raised    Anemia    Anxiety    Arthritis    Depression    Diabetes mellitus without complication (HCC)    Elevated cholesterol    GERD (gastroesophageal reflux disease)    Hemorrhoids    Hypertension    Kidney disease     Medications: Outpatient Medications Prior to Visit  Medication Sig   acetaminophen (TYLENOL) 500 MG tablet Take 500 mg by mouth every 6 (six) hours as needed.    aspirin EC 81 MG tablet Take 81 mg by mouth daily.    carvedilol (COREG) 25 MG tablet Take 1 tablet by mouth twice daily   cholestyramine (QUESTRAN) 4 g packet DISSOLVE AND TAKE ONE PACKET BY MOUTH THREE TIMES A DAY   clopidogrel (PLAVIX) 75 MG tablet Take 1 tablet by mouth once daily   cyanocobalamin 1000 MCG tablet Take 1,000 mcg by mouth daily.   DULoxetine (CYMBALTA) 60 MG capsule Take 1 capsule by mouth once daily   gabapentin (NEURONTIN) 100 MG capsule TAKE 1 CAPSULE BY MOUTH THREE TIMES DAILY   Insulin Glargine (BASAGLAR KWIKPEN) 100 UNIT/ML INJECT 24 UNITS SUBCUTANEOUSLY ONCE DAILY   irbesartan (AVAPRO) 75 MG tablet Take 1 tablet (75 mg total) by mouth daily.   isosorbide mononitrate (IMDUR) 30 MG 24 hr tablet Take 1 tablet (30 mg total) by mouth  daily.   lovastatin (MEVACOR) 40 MG tablet Take 1 tablet by mouth once daily   nitroGLYCERIN (NITROSTAT) 0.4 MG SL tablet Place 1 tablet (0.4 mg total) under the tongue every 5 (five) minutes as needed for chest pain.   pantoprazole (PROTONIX) 40 MG tablet Take 1 tablet (40 mg total) by mouth daily.   tirzepatide (MOUNJARO) 2.5 MG/0.5ML Pen INJECT 2.5MG  INTO THE SKIN ONCE WEEKLY   No facility-administered medications prior to visit.    Review of Systems  Last  CBC Lab Results  Component Value Date   WBC 5.6 03/01/2023   HGB 9.5 (L) 03/01/2023   HCT 28.6 (L) 03/01/2023   MCV 87.2 03/01/2023   MCH 29.0 03/01/2023   RDW 14.6 03/01/2023   PLT 117 (L) 03/01/2023   Last metabolic panel Lab Results  Component Value Date   GLUCOSE 150 (H) 03/15/2023   NA 140 03/15/2023   K 4.7 03/15/2023   CL 106 03/15/2023   CO2 19 (L) 03/15/2023   BUN 19 03/15/2023   CREATININE 1.52 (H) 03/15/2023   EGFR 36 (L) 03/15/2023   CALCIUM 8.9 03/15/2023   PROT 6.7 02/27/2023   ALBUMIN 3.6 02/27/2023   LABGLOB 2.6 06/09/2022   AGRATIO 1.6 06/09/2022   BILITOT 0.3 02/27/2023   ALKPHOS 74 02/27/2023   AST 14 (L) 02/27/2023   ALT 10 02/27/2023   ANIONGAP 7 03/01/2023   Last lipids Lab Results  Component Value Date   CHOL 131 12/25/2022   HDL 58 12/25/2022   LDLCALC 45 12/25/2022   TRIG 168 (H) 12/25/2022   CHOLHDL 2.3 12/25/2022   Last hemoglobin A1c Lab Results  Component Value Date   HGBA1C 7.4 (H) 12/25/2022   Last thyroid functions Lab Results  Component Value Date   TSH 2.080 09/22/2021         Objective    BP (!) 87/53   Pulse 72   Temp 97.7 F (36.5 C)   Resp 18   Ht 5\' 1"  (1.549 m)   Wt 171 lb 9.6 oz (77.8 kg)   SpO2 100%   BMI 32.42 kg/m   BP Readings from Last 3 Encounters:  04/27/23 (!) 87/53  03/15/23 132/68  03/01/23 (!) 107/57   Wt Readings from Last 3 Encounters:  04/27/23 171 lb 9.6 oz (77.8 kg)  03/15/23 189 lb 3.2 oz (85.8 kg)  03/01/23 195 lb 1.7 oz (88.5 kg)      Results   LABS A1c: 7.2 BMP: Cr 1.52 (03/15/2023) GFR: 33 (10/2022)     Physical Exam   General: Alert, no acute distress Cardio: Normal S1 and S2, RRR, no r/m/g Pulm: CTAB, normal work of breathing Abdomen: Bowel sounds normal. Abdomen soft and non-tender.  Extremities: No peripheral edema.    No results found for any visits on 04/27/23.  Assessment & Plan     Problem List Items Addressed This Visit       Cardiovascular and  Mediastinum   PAD (peripheral artery disease) (HCC)     Peripheral Artery Disease (PAD) chronic Managed with Plavix and aspirin. Atherosclerosis of native arteries of extremities with intermittent claudication. - Continue Plavix 75 mg daily - Continue aspirin 81 mg daily      Essential hypertension    Hypertension Usually well-controlled. Recent BMP showed stable creatinine at 1.52. Last GFR was 33 (May 2024). Blood pressure was low at 95/50 mmHg during a recent dental visit, likely due to weight loss. Discussed the need to monitor blood pressure regularly  and adjust medications if necessary. - Continue Coreg 25 mg twice daily - Confirm irbesartan dosage (75 mg)        Respiratory   Apnea, sleep    Chronic  Stable with CPAP use  Patient advised to continue CPAP nightly  The patient has had significant benefit from the use of CPAP machine with considerable improvements in quality of life, work production and decrease medical complaints.  The patient will need continued maintenance and care CPAP supplies (including upgrades as deemed appropriate) in order to continue treatment for sleep apnea as this is medically necessary.           Digestive   Acid reflux     Gastroesophageal Reflux Disease (GERD) Managed with Protonix. Chronic  Stable  - Continue Protonix 40 mg daily       Relevant Medications   ondansetron (ZOFRAN-ODT) 4 MG disintegrating tablet     Endocrine   Hypoglycemia due to type 2 diabetes mellitus (HCC) - Primary    Appears to have more higher glucose measurements than lows  Managed by Endocrinology       Hyperlipidemia associated with type 2 diabetes mellitus (HCC)    Chronic  Lab Results  Component Value Date   CHOL 131 12/25/2022   HDL 58 12/25/2022   LDLCALC 45 12/25/2022   TRIG 168 (H) 12/25/2022   CHOLHDL 2.3 12/25/2022    - Continue statin  40 mg daily       DM type 2 (diabetes mellitus, type 2) (HCC)       Chronic condition with  recent hypoglycemia episodes. A1c was 7.2. Blood glucose levels have been fluctuating, with recent readings of 143 mg/dL and occasional highs up to 200 mg/dL postprandially. Managed with metformin 500 mg twice daily, Mounjaro 5 mg weekly, and Basaglar 24 units at night. Discussed the importance of monitoring blood glucose levels and adjusting Basaglar dosage accordingly to prevent hypoglycemia. Informed about the potential side effects of Mounjaro, including nausea. - Continue metformin 500 mg twice daily - Continue Mounjaro 5 mg weekly - Continue Basaglar 24 units at night, decrease to 20 units if blood glucose < 105 mg/dL, and further decrease to 16 units if levels remain low - Prescribe Zofran 4 mg disintegrating tablets for nausea - follow up with Dr. Gershon Crane as scheduled       Diabetic neuropathy (HCC)     Diabetic Neuropathy Managed with gabapentin 100 mg three times daily. - Continue gabapentin 100 mg three times daily        Genitourinary   Chronic kidney disease (CKD), stage III (moderate) (HCC)     Chronic Kidney Disease Stage 3B Condition with creatinine at 1.52 and GFR of 33 as of May 2024. - Monitor renal function regularly        Other   Anxiety     Generalized Anxiety Disorder,chronic, stable Managed with Cymbalta. - Continue Cymbalta 60 mg daily      Other Visit Diagnoses     Gross hematuria       Relevant Orders   Urinalysis, Routine w reflex microscopic   Nausea and vomiting in adult       Relevant Medications   ondansetron (ZOFRAN-ODT) 4 MG disintegrating tablet           Reports hematuria  - Perform urine microscopy to check for blood cells.         Return in about 4 months (around 08/25/2023) for CHRONIC F/U.  [name], MD  [facility] [phone] (phone) [phone] (fax)  [facility]

## 2023-04-28 LAB — URINALYSIS, ROUTINE W REFLEX MICROSCOPIC
Bilirubin, UA: NEGATIVE
Glucose, UA: NEGATIVE
Ketones, UA: NEGATIVE
Nitrite, UA: NEGATIVE
RBC, UA: NEGATIVE
Specific Gravity, UA: 1.016 (ref 1.005–1.030)
Urobilinogen, Ur: 0.2 mg/dL (ref 0.2–1.0)
pH, UA: 7.5 (ref 5.0–7.5)

## 2023-04-28 LAB — MICROSCOPIC EXAMINATION
Casts: NONE SEEN /[LPF]
Epithelial Cells (non renal): NONE SEEN /[HPF] (ref 0–10)
WBC, UA: >30 /[HPF] — AB (ref 0–5)

## 2023-04-30 DIAGNOSIS — E1142 Type 2 diabetes mellitus with diabetic polyneuropathy: Secondary | ICD-10-CM | POA: Diagnosis not present

## 2023-05-06 ENCOUNTER — Encounter: Payer: Self-pay | Admitting: Family Medicine

## 2023-05-07 ENCOUNTER — Other Ambulatory Visit: Payer: Self-pay | Admitting: Family Medicine

## 2023-05-07 DIAGNOSIS — R8271 Bacteriuria: Secondary | ICD-10-CM

## 2023-05-07 MED ORDER — NITROFURANTOIN MONOHYD MACRO 100 MG PO CAPS
100.0000 mg | ORAL_CAPSULE | Freq: Two times a day (BID) | ORAL | 0 refills | Status: AC
Start: 2023-05-07 — End: 2023-05-14

## 2023-05-15 ENCOUNTER — Other Ambulatory Visit: Payer: Self-pay | Admitting: Family Medicine

## 2023-05-15 DIAGNOSIS — F411 Generalized anxiety disorder: Secondary | ICD-10-CM

## 2023-05-15 DIAGNOSIS — E0842 Diabetes mellitus due to underlying condition with diabetic polyneuropathy: Secondary | ICD-10-CM

## 2023-05-17 ENCOUNTER — Other Ambulatory Visit: Payer: Self-pay

## 2023-05-17 ENCOUNTER — Emergency Department: Payer: Medicare HMO

## 2023-05-17 ENCOUNTER — Inpatient Hospital Stay
Admission: EM | Admit: 2023-05-17 | Discharge: 2023-05-21 | DRG: 698 | Disposition: A | Discharge status: Home / Self Care | Payer: Medicare HMO | Attending: Internal Medicine | Admitting: Internal Medicine

## 2023-05-17 ENCOUNTER — Encounter: Payer: Self-pay | Admitting: Emergency Medicine

## 2023-05-17 DIAGNOSIS — T83518A Infection and inflammatory reaction due to other urinary catheter, initial encounter: Secondary | ICD-10-CM | POA: Diagnosis not present

## 2023-05-17 DIAGNOSIS — E7800 Pure hypercholesterolemia, unspecified: Secondary | ICD-10-CM | POA: Diagnosis present

## 2023-05-17 DIAGNOSIS — R1084 Generalized abdominal pain: Secondary | ICD-10-CM | POA: Diagnosis not present

## 2023-05-17 DIAGNOSIS — I959 Hypotension, unspecified: Secondary | ICD-10-CM | POA: Diagnosis present

## 2023-05-17 DIAGNOSIS — I739 Peripheral vascular disease, unspecified: Secondary | ICD-10-CM | POA: Diagnosis present

## 2023-05-17 DIAGNOSIS — D631 Anemia in chronic kidney disease: Secondary | ICD-10-CM | POA: Diagnosis present

## 2023-05-17 DIAGNOSIS — Z803 Family history of malignant neoplasm of breast: Secondary | ICD-10-CM

## 2023-05-17 DIAGNOSIS — Z794 Long term (current) use of insulin: Secondary | ICD-10-CM

## 2023-05-17 DIAGNOSIS — Z7982 Long term (current) use of aspirin: Secondary | ICD-10-CM

## 2023-05-17 DIAGNOSIS — R339 Retention of urine, unspecified: Secondary | ICD-10-CM | POA: Diagnosis present

## 2023-05-17 DIAGNOSIS — Z743 Need for continuous supervision: Secondary | ICD-10-CM | POA: Diagnosis not present

## 2023-05-17 DIAGNOSIS — Z7984 Long term (current) use of oral hypoglycemic drugs: Secondary | ICD-10-CM

## 2023-05-17 DIAGNOSIS — I129 Hypertensive chronic kidney disease with stage 1 through stage 4 chronic kidney disease, or unspecified chronic kidney disease: Secondary | ICD-10-CM | POA: Diagnosis present

## 2023-05-17 DIAGNOSIS — D696 Thrombocytopenia, unspecified: Secondary | ICD-10-CM | POA: Diagnosis not present

## 2023-05-17 DIAGNOSIS — E119 Type 2 diabetes mellitus without complications: Secondary | ICD-10-CM

## 2023-05-17 DIAGNOSIS — I10 Essential (primary) hypertension: Secondary | ICD-10-CM | POA: Diagnosis present

## 2023-05-17 DIAGNOSIS — M1990 Unspecified osteoarthritis, unspecified site: Secondary | ICD-10-CM | POA: Diagnosis present

## 2023-05-17 DIAGNOSIS — Z825 Family history of asthma and other chronic lower respiratory diseases: Secondary | ICD-10-CM

## 2023-05-17 DIAGNOSIS — Z79899 Other long term (current) drug therapy: Secondary | ICD-10-CM

## 2023-05-17 DIAGNOSIS — R112 Nausea with vomiting, unspecified: Secondary | ICD-10-CM | POA: Diagnosis not present

## 2023-05-17 DIAGNOSIS — Z8249 Family history of ischemic heart disease and other diseases of the circulatory system: Secondary | ICD-10-CM

## 2023-05-17 DIAGNOSIS — Z820 Family history of epilepsy and other diseases of the nervous system: Secondary | ICD-10-CM

## 2023-05-17 DIAGNOSIS — Z7985 Long-term (current) use of injectable non-insulin antidiabetic drugs: Secondary | ICD-10-CM

## 2023-05-17 DIAGNOSIS — Z87440 Personal history of urinary (tract) infections: Secondary | ICD-10-CM

## 2023-05-17 DIAGNOSIS — N179 Acute kidney failure, unspecified: Secondary | ICD-10-CM

## 2023-05-17 DIAGNOSIS — Z1629 Resistance to other single specified antibiotic: Secondary | ICD-10-CM | POA: Diagnosis present

## 2023-05-17 DIAGNOSIS — Z7902 Long term (current) use of antithrombotics/antiplatelets: Secondary | ICD-10-CM

## 2023-05-17 DIAGNOSIS — K5730 Diverticulosis of large intestine without perforation or abscess without bleeding: Secondary | ICD-10-CM | POA: Diagnosis not present

## 2023-05-17 DIAGNOSIS — Z9049 Acquired absence of other specified parts of digestive tract: Secondary | ICD-10-CM | POA: Diagnosis not present

## 2023-05-17 DIAGNOSIS — I2510 Atherosclerotic heart disease of native coronary artery without angina pectoris: Secondary | ICD-10-CM

## 2023-05-17 DIAGNOSIS — Z8269 Family history of other diseases of the musculoskeletal system and connective tissue: Secondary | ICD-10-CM

## 2023-05-17 DIAGNOSIS — Y846 Urinary catheterization as the cause of abnormal reaction of the patient, or of later complication, without mention of misadventure at the time of the procedure: Secondary | ICD-10-CM | POA: Diagnosis present

## 2023-05-17 DIAGNOSIS — I700 Atherosclerosis of aorta: Secondary | ICD-10-CM | POA: Diagnosis not present

## 2023-05-17 DIAGNOSIS — N3020 Other chronic cystitis without hematuria: Secondary | ICD-10-CM | POA: Diagnosis present

## 2023-05-17 DIAGNOSIS — R079 Chest pain, unspecified: Principal | ICD-10-CM

## 2023-05-17 DIAGNOSIS — E66811 Obesity, class 1: Secondary | ICD-10-CM

## 2023-05-17 DIAGNOSIS — Z8262 Family history of osteoporosis: Secondary | ICD-10-CM

## 2023-05-17 DIAGNOSIS — Z6832 Body mass index (BMI) 32.0-32.9, adult: Secondary | ICD-10-CM

## 2023-05-17 DIAGNOSIS — Z833 Family history of diabetes mellitus: Secondary | ICD-10-CM

## 2023-05-17 DIAGNOSIS — N3090 Cystitis, unspecified without hematuria: Secondary | ICD-10-CM

## 2023-05-17 DIAGNOSIS — E871 Hypo-osmolality and hyponatremia: Secondary | ICD-10-CM | POA: Diagnosis present

## 2023-05-17 DIAGNOSIS — E1169 Type 2 diabetes mellitus with other specified complication: Secondary | ICD-10-CM

## 2023-05-17 DIAGNOSIS — R7989 Other specified abnormal findings of blood chemistry: Secondary | ICD-10-CM

## 2023-05-17 DIAGNOSIS — N1832 Chronic kidney disease, stage 3b: Secondary | ICD-10-CM | POA: Diagnosis present

## 2023-05-17 DIAGNOSIS — N170 Acute kidney failure with tubular necrosis: Secondary | ICD-10-CM | POA: Diagnosis present

## 2023-05-17 DIAGNOSIS — N3000 Acute cystitis without hematuria: Principal | ICD-10-CM | POA: Diagnosis present

## 2023-05-17 DIAGNOSIS — E860 Dehydration: Secondary | ICD-10-CM | POA: Diagnosis present

## 2023-05-17 DIAGNOSIS — E1151 Type 2 diabetes mellitus with diabetic peripheral angiopathy without gangrene: Secondary | ICD-10-CM | POA: Diagnosis present

## 2023-05-17 DIAGNOSIS — R1032 Left lower quadrant pain: Secondary | ICD-10-CM | POA: Diagnosis not present

## 2023-05-17 DIAGNOSIS — E1122 Type 2 diabetes mellitus with diabetic chronic kidney disease: Secondary | ICD-10-CM | POA: Diagnosis present

## 2023-05-17 DIAGNOSIS — R0902 Hypoxemia: Secondary | ICD-10-CM | POA: Diagnosis not present

## 2023-05-17 DIAGNOSIS — E872 Acidosis, unspecified: Secondary | ICD-10-CM | POA: Diagnosis present

## 2023-05-17 LAB — CBC
HCT: 33.8 % — ABNORMAL LOW (ref 36.0–46.0)
Hemoglobin: 10.9 g/dL — ABNORMAL LOW (ref 12.0–15.0)
MCH: 29.3 pg (ref 26.0–34.0)
MCHC: 32.2 g/dL (ref 30.0–36.0)
MCV: 90.9 fL (ref 80.0–100.0)
Platelets: 132 10*3/uL — ABNORMAL LOW (ref 150–400)
RBC: 3.72 MIL/uL — ABNORMAL LOW (ref 3.87–5.11)
RDW: 14.7 % (ref 11.5–15.5)
WBC: 8 10*3/uL (ref 4.0–10.5)
nRBC: 0 % (ref 0.0–0.2)

## 2023-05-17 LAB — BASIC METABOLIC PANEL
Anion gap: 11 (ref 5–15)
BUN: 40 mg/dL — ABNORMAL HIGH (ref 8–23)
CO2: 18 mmol/L — ABNORMAL LOW (ref 22–32)
Calcium: 8.6 mg/dL — ABNORMAL LOW (ref 8.9–10.3)
Chloride: 104 mmol/L (ref 98–111)
Creatinine, Ser: 2.22 mg/dL — ABNORMAL HIGH (ref 0.44–1.00)
GFR, Estimated: 23 mL/min — ABNORMAL LOW (ref >60)
Glucose, Bld: 157 mg/dL — ABNORMAL HIGH (ref 70–99)
Potassium: 4.3 mmol/L (ref 3.5–5.1)
Sodium: 133 mmol/L — ABNORMAL LOW (ref 135–145)

## 2023-05-17 LAB — LIPASE, BLOOD: Lipase: 74 U/L — ABNORMAL HIGH (ref 11–51)

## 2023-05-17 LAB — TROPONIN I (HIGH SENSITIVITY): Troponin I (High Sensitivity): 61 ng/L — ABNORMAL HIGH (ref <18)

## 2023-05-17 MED ORDER — MORPHINE SULFATE (PF) 4 MG/ML IV SOLN
4.0000 mg | Freq: Once | INTRAVENOUS | Status: AC
  Administered 2023-05-18: 4 mg via INTRAVENOUS
  Filled 2023-05-17: qty 1

## 2023-05-17 MED ORDER — SODIUM CHLORIDE 0.9 % IV BOLUS
1000.0000 mL | Freq: Once | INTRAVENOUS | Status: AC
Start: 2023-05-17 — End: 2023-05-18
  Administered 2023-05-17: 1000 mL via INTRAVENOUS

## 2023-05-17 MED ORDER — ONDANSETRON HCL 4 MG/2ML IJ SOLN
4.0000 mg | Freq: Once | INTRAMUSCULAR | Status: AC
  Administered 2023-05-17: 4 mg via INTRAVENOUS
  Filled 2023-05-17: qty 2

## 2023-05-17 MED ORDER — SODIUM CHLORIDE 0.9 % IV BOLUS
1000.0000 mL | Freq: Once | INTRAVENOUS | Status: AC
  Administered 2023-05-17: 1000 mL via INTRAVENOUS

## 2023-05-17 MED ORDER — NITROGLYCERIN 2 % TD OINT: 0.5000 [in_us] | TOPICAL_OINTMENT | Freq: Once | TRANSDERMAL | Status: DC

## 2023-05-17 NOTE — ED Triage Notes (Signed)
See first nurse note:  During triage patient states she is still having some chest tightness but it has improved, she also complaints of lower abdominal pain and overall not feeling well for the past couple of days.

## 2023-05-17 NOTE — ED Provider Notes (Signed)
[facility] Provider Note    Event Date/Time   First MD Initiated Contact with Patient 05/17/23 2319     (approximate)   History   Abdominal Pain   HPI  Cynthia Sims is a 72 y.o. female brought to the ED via EMS from home with a chief complaint of chest pain, abdominal pain, nausea/vomiting/diarrhea.  Patient has had generalized abdominal pain and N/V/D on and off for the past 2 weeks.  She was already planning to come to the ED tonight for evaluation when she experienced central chest pressure without radiation.  Denies associated diaphoresis, palpitations, dizziness.  Patient took 324 mg baby aspirin and 1 sublingual nitroglycerin prior to EMS arrival which brought her chest pain from 5 down to a 2/10.  Denies associated fever/chills or dysuria.     Past Medical History   Past Medical History:  Diagnosis Date   ALT (SGPT) level raised    Anemia    Anxiety    Arthritis    Depression    Diabetes mellitus without complication (HCC)    Elevated cholesterol    GERD (gastroesophageal reflux disease)    Hemorrhoids    Hypertension    Kidney disease      Active Problem List   Patient Active Problem List   Diagnosis Date Noted   Acute cystitis 05/18/2023   Hypotension 05/18/2023   Coronary artery disease 05/18/2023   Metabolic acidosis 02/27/2023   Hypoglycemia due to type 2 diabetes mellitus (HCC) 02/26/2023   Acute renal failure superimposed on stage 3b chronic kidney disease (HCC) 02/26/2023   Thrombocytopenia (HCC) 02/26/2023   Anemia    Atherosclerosis of native arteries of extremity with intermittent claudication (HCC) 02/19/2023   PAD (peripheral artery disease) (HCC) 12/25/2022   Hernia of abdominal cavity 12/25/2022   Moderate nonproliferative diabetic retinopathy (HCC) 10/17/2022   Hypertensive retinopathy of both eyes 10/17/2022   Claudication of both lower extremities (HCC) 08/23/2022   Essential hypertension 06/09/2022    Diabetic neuropathy (HCC) 05/22/2022   Mild cognitive disorder 03/13/2019   Chronic diarrhea 03/13/2019   Benign head tremor 03/13/2019   B12 deficiency 03/13/2019   H. 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   DM type 2 (diabetes mellitus, type 2) (HCC) 12/14/2005   Hyperlipidemia associated with type 2 diabetes mellitus (HCC) 12/14/2005     Past Surgical History   Past Surgical History:  Procedure Laterality Date   abdominal blockage     CESAREAN SECTION  1983   CESAREAN SECTION     CHOLECYSTECTOMY     COLONOSCOPY WITH PROPOFOL N/A 10/29/2015   Procedure: COLONOSCOPY WITH PROPOFOL;  Surgeon: Scot Jun, MD;  Location: [facility] ENDOSCOPY;  Service: Endoscopy;  Laterality: N/A;   COLONOSCOPY WITH PROPOFOL N/A 11/07/2017   Procedure: COLONOSCOPY WITH PROPOFOL;  Surgeon: Toledo, Boykin Nearing, MD;  Location: [facility] ENDOSCOPY;  Service: Gastroenterology;  Laterality: N/A;   DILATION AND CURETTAGE OF UTERUS  2001   ESOPHAGOGASTRODUODENOSCOPY (EGD) WITH PROPOFOL N/A 10/29/2015   Procedure: ESOPHAGOGASTRODUODENOSCOPY (EGD) WITH PROPOFOL;  Surgeon: Scot Jun, MD;  Location: [facility] ENDOSCOPY;  Service: Endoscopy;  Laterality: N/A;   LOWER EXTREMITY ANGIOGRAPHY Right 10/24/2022   Procedure: Lower Extremity Angiography;  Surgeon: Gilda Crease,  Latina Craver, MD;  Location: [facility] INVASIVE CV LAB;  Service: Cardiovascular;  Laterality: Right;   LOWER EXTREMITY ANGIOGRAPHY Left 10/31/2022   Procedure: Lower Extremity Angiography;  Surgeon: Renford Dills, MD;  Location: [facility] INVASIVE CV LAB;  Service: Cardiovascular;  Laterality: Left;     Home Medications   Prior to Admission medications   Medication Sig Start Date End Date Taking?  Authorizing Provider  glipiZIDE (GLUCOTROL) 10 MG tablet Take 10 mg by mouth 2 (two) times daily. 04/27/23  Yes [provider]  GVOKE HYPOPEN 2-PACK 1 MG/0.2ML SOAJ Inject 1 mg as directed as directed.  INJECT 1 PEN IN CASE OF SEVERE HYPOGLYCEMIA 03/30/23  Yes [provider]  irbesartan (AVAPRO) 300 MG tablet Take 300 mg by mouth daily.   Yes [provider]  JARDIANCE 10 MG TABS tablet Take 10 mg by mouth every morning. 04/30/23  Yes [provider]  metFORMIN (GLUCOPHAGE) 500 MG tablet Take 500 mg by mouth 2 (two) times daily. 04/27/23  Yes [provider]  MOUNJARO 5 MG/0.5ML Pen Inject 5 mg into the skin once a week. 05/16/23  Yes [provider]  acetaminophen (TYLENOL) 500 MG tablet Take 500 mg by mouth every 6 (six) hours as needed.     [provider]  aspirin EC 81 MG tablet Take 81 mg by mouth daily.     [provider]  carvedilol (COREG) 25 MG tablet Take 1 tablet by mouth twice daily 01/05/23   Mealor, Roberts Gaudy, MD  cholestyramine (QUESTRAN) 4 g packet DISSOLVE AND TAKE ONE PACKET BY MOUTH THREE TIMES A DAY 03/12/23   Ostwalt, Janna, PA-C  clopidogrel (PLAVIX) 75 MG tablet Take 1 tablet by mouth once daily 04/17/23   Schnier, Latina Craver, MD  cyanocobalamin 1000 MCG tablet Take 1,000 mcg by mouth daily.    [provider]  DULoxetine (CYMBALTA) 60 MG capsule Take 1 capsule by mouth once daily 05/17/23   Simmons-Robinson, Makiera, MD  gabapentin (NEURONTIN) 100 MG capsule TAKE 1 CAPSULE BY MOUTH THREE TIMES DAILY 01/19/23   Simmons-Robinson, Makiera, MD  Insulin Glargine (BASAGLAR KWIKPEN) 100 UNIT/ML INJECT 24 UNITS SUBCUTANEOUSLY ONCE DAILY 04/24/23   Simmons-Robinson, Makiera, MD  irbesartan (AVAPRO) 75 MG tablet Take 1 tablet (75 mg total) by mouth daily. 03/15/23   Simmons-Robinson, Tawanna Cooler, MD  isosorbide mononitrate (IMDUR) 30 MG 24 hr tablet Take 1 tablet (30 mg total) by mouth daily. 09/20/22   Sharlene Dory, PA-C  lovastatin (MEVACOR) 40 MG tablet Take 1 tablet by mouth once daily 03/06/23   Ostwalt, Edmon Crape, PA-C  nitroGLYCERIN (NITROSTAT) 0.4 MG SL tablet Place 1 tablet (0.4 mg total) under the tongue every 5 (five) minutes as needed for chest pain. 07/13/22   Chandrasekhar, Lafayette Dragon A, MD  ondansetron (ZOFRAN-ODT) 4 MG disintegrating tablet Take 1 tablet (4 mg total) by mouth every 8 (eight) hours as needed for nausea or vomiting. 04/27/23   Simmons-Robinson, Makiera, MD  pantoprazole (PROTONIX) 40 MG tablet Take 1 tablet (40 mg total) by mouth daily. 10/25/22 10/25/23  Schnier, Latina Craver, MD  tirzepatide [facility]) 2.5 MG/0.5ML Pen INJECT 2.5MG  INTO THE SKIN ONCE WEEKLY 03/19/23   Christell Constant, MD     Allergies  Patient has no known allergies.   Family History   Family History  Problem Relation Age of Onset   Alzheimer's disease Mother    COPD Mother    Breast cancer Mother 7   Osteoporosis Mother    Heart  attack Father    Lupus Father    Diabetes Father    Hypertension Father    Diabetes Sister    COPD Maternal Grandmother    Diabetes Paternal Grandfather    Diabetes Sister    Diabetes Sister      Physical Exam  Triage Vital Signs: ED Triage Vitals  Encounter Vitals Group     BP 05/17/23 2137 (!) 86/55     Systolic BP Percentile --      Diastolic BP Percentile --      Pulse Rate 05/17/23 2137 77     Resp 05/17/23 2137 18     Temp 05/17/23 2137 97.9 F (36.6 C)     Temp Source 05/17/23 2137 Oral     SpO2 05/17/23 2137 100 %     Weight 05/17/23 2138 169 lb 12.1 oz (77 kg)     Height 05/17/23 2138 5\' 1"  (1.549 m)     Head Circumference --      Peak Flow --      Pain Score 05/17/23 2138 2     Pain Loc --      Pain Education --      Exclude from Growth Chart --     Updated Vital Signs: BP (!) 92/44   Pulse 85   Temp 97.9 F (36.6 C) (Oral)   Resp 11   Ht 5\' 1"  (1.549 m)   Wt 77 kg   SpO2 94%   BMI 32.07 kg/m    General: Awake, mild distress.   Mildly dry mucous membranes. CV:  RRR.  Good peripheral perfusion.  Resp:  Normal effort.  CTAB. Abd:  Mild tenderness to lower abdomen without rebound or guarding.  No distention.  Other:  No truncal vesicles.   ED Results / Procedures / Treatments  Labs (all labs ordered are listed, but only abnormal results are displayed) Labs Reviewed  BASIC METABOLIC PANEL - Abnormal; Notable for the following components:      Result Value   Sodium 133 (*)    CO2 18 (*)    Glucose, Bld 157 (*)    BUN 40 (*)    Creatinine, Ser 2.22 (*)    Calcium 8.6 (*)    GFR, Estimated 23 (*)    All other components within normal limits  CBC - Abnormal; Notable for the following components:   RBC 3.72 (*)    Hemoglobin 10.9 (*)    HCT 33.8 (*)    Platelets 132 (*)    All other components within normal limits  URINALYSIS, ROUTINE W REFLEX MICROSCOPIC - Abnormal; Notable for the following components:   Color, Urine YELLOW (*)    APPearance CLOUDY (*)    Glucose, UA >=500 (*)    Hgb urine dipstick SMALL (*)    Leukocytes,Ua LARGE (*)    Bacteria, UA MANY (*)    Non Squamous Epithelial PRESENT (*)    All other components within normal limits  LIPASE, BLOOD - Abnormal; Notable for the following components:   Lipase 74 (*)    All other components within normal limits  HEPATIC FUNCTION PANEL - Abnormal; Notable for the following components:   Total Protein 5.3 (*)    Albumin 2.5 (*)    All other components within normal limits  TROPONIN I (HIGH SENSITIVITY) - Abnormal; Notable for the following components:   Troponin I (High Sensitivity) 61 (*)    All other components within normal limits  TROPONIN I (HIGH SENSITIVITY) - Abnormal;  Notable for the following components:   Troponin I (High Sensitivity) 45 (*)    All other components within normal limits  CULTURE, BLOOD (ROUTINE X 2)  CULTURE, BLOOD (ROUTINE X 2)  LACTIC ACID, PLASMA  LACTIC ACID, PLASMA  LIPOPROTEIN A (LPA)     EKG  ED ECG  REPORT I, Camreigh Michie J, the attending physician, personally viewed and interpreted this ECG.   Date: 05/17/2023  EKG Time: 2140  Rate: 78  Rhythm: normal sinus rhythm  Axis: Normal  Intervals:none  ST&T Change: Nonspecific    RADIOLOGY I have independently visualized interpreted patient's imaging studies as well as noted the radiology interpretation:  X-ray: Acute cardiopulmonary process  CT Abdomen/Pelvis: Cystitis, pneumobilia  Official radiology report(s): CT ABDOMEN PELVIS WO CONTRAST  Result Date: 05/18/2023 CLINICAL DATA:  Left lower quadrant pain EXAM: CT ABDOMEN AND PELVIS WITHOUT CONTRAST TECHNIQUE: Multidetector CT imaging of the abdomen and pelvis was performed following the standard protocol without IV contrast. RADIATION DOSE REDUCTION: This exam was performed according to the departmental dose-optimization program which includes automated exposure control, adjustment of the mA and/or kV according to patient size and/or use of iterative reconstruction technique. COMPARISON:  CT abdomen and pelvis 01/17/2023 FINDINGS: Lower chest: No acute abnormality. Hepatobiliary: Patient is status post cholecystectomy. There is pneumobilia in the left lobe of the liver which is new from prior. No focal liver lesions are seen. No biliary ductal dilatation. Pancreas: Unremarkable. No pancreatic ductal dilatation or surrounding inflammatory changes. Spleen: Normal in size without focal abnormality. Adrenals/Urinary Tract: Bladder is decompressed. There is mild inflammatory stranding surrounding the bladder. There is no hydronephrosis or focal renal lesion. The adrenal glands are within normal limits. Stomach/Bowel: Stomach is within normal limits. Appendix appears normal. No evidence of bowel wall thickening, distention, or inflammatory changes. There is sigmoid colon diverticulosis. Vascular/Lymphatic: Aortic atherosclerosis. No enlarged abdominal or pelvic lymph nodes. Reproductive: Uterus and  bilateral adnexa are unremarkable. Other: No abdominal wall hernia or abnormality. No abdominopelvic ascites. Musculoskeletal: No acute or significant osseous findings. IMPRESSION: 1. Mild inflammatory stranding surrounding the bladder worrisome for cystitis. 2. New pneumobilia in the left lobe of the liver. Please correlate with surgical history of prior sphincterotomy. 3. Sigmoid colon diverticulosis. 4. Aortic atherosclerosis. Aortic Atherosclerosis (ICD10-I70.0). Electronically Signed   By: Darliss Cheney M.D.   On: 05/18/2023 00:54   DG Chest 2 View  Result Date: 05/17/2023 CLINICAL DATA:  chest pain EXAM: CHEST - 2 VIEW COMPARISON:  06/09/2022. FINDINGS: Cardiac silhouette is unremarkable. No pneumothorax or pleural effusion. The lungs are clear. Aorta is calcified. The visualized skeletal structures are unremarkable. IMPRESSION: No acute cardiopulmonary process. Electronically Signed   By: Layla Maw M.D.   On: 05/17/2023 22:23     PROCEDURES:  Critical Care performed: Yes, see critical care procedure note(s)  CRITICAL CARE Performed by: Irean Hong   Total critical care time: 30 minutes  Critical care time was exclusive of separately billable procedures and treating other patients.  Critical care was necessary to treat or prevent imminent or life-threatening deterioration.  Critical care was time spent personally by me on the following activities: development of treatment plan with patient and/or surrogate as well as nursing, discussions with consultants, evaluation of patient's response to treatment, examination of patient, obtaining history from patient or surrogate, ordering and performing treatments and interventions, ordering and review of laboratory studies, ordering and review of radiographic studies, pulse oximetry and re-evaluation of patient's condition.   Marland Kitchen1-3 Lead EKG Interpretation  Performed  by: Irean Hong, MD Authorized by: Irean Hong, MD      Interpretation: normal     ECG rate:  80   ECG rate assessment: normal     Rhythm: sinus rhythm     Ectopy: none     Conduction: normal   Comments:     Patient placed on cardiac monitor to evaluate for arrhythmias    MEDICATIONS ORDERED IN ED: Medications  nitroGLYCERIN (NITROGLYN) 2 % ointment 0.5 inch (0 inches Topical Hold 05/18/23 0156)  pravastatin (PRAVACHOL) tablet 10 mg (has no administration in time range)  insulin glargine (LANTUS) injection 20 Units (has no administration in time range)  DULoxetine (CYMBALTA) DR capsule 60 mg (has no administration in time range)  pantoprazole (PROTONIX) EC tablet 40 mg (has no administration in time range)  clopidogrel (PLAVIX) tablet 75 mg (has no administration in time range)  aspirin EC tablet 81 mg (has no administration in time range)  nitroGLYCERIN (NITROSTAT) SL tablet 0.4 mg (has no administration in time range)  acetaminophen (TYLENOL) tablet 650 mg (has no administration in time range)  ondansetron (ZOFRAN) injection 4 mg (has no administration in time range)  enoxaparin (LOVENOX) injection 40 mg (has no administration in time range)  0.9 %  sodium chloride infusion ( Intravenous New Bag/Given 05/18/23 0438)  piperacillin-tazobactam (ZOSYN) IVPB 3.375 g (has no administration in time range)  sodium chloride 0.9 % bolus 1,000 mL (0 mLs Intravenous Stopped 05/17/23 2322)  sodium chloride 0.9 % bolus 1,000 mL (0 mLs Intravenous Stopped 05/18/23 0030)  morphine (PF) 4 MG/ML injection 4 mg (4 mg Intravenous Given 05/18/23 0009)  ondansetron (ZOFRAN) injection 4 mg (4 mg Intravenous Given 05/17/23 2356)  piperacillin-tazobactam (ZOSYN) IVPB 3.375 g (0 g Intravenous Stopped 05/18/23 0243)  sodium chloride 0.9 % bolus 1,000 mL (0 mLs Intravenous Stopped 05/18/23 0312)  sodium chloride 0.9 % bolus 1,000 mL (1,000 mLs Intravenous New Bag/Given 05/18/23 0437)     IMPRESSION / MDM / ASSESSMENT AND PLAN / ED COURSE  I reviewed the triage vital  signs and the nursing notes.                             72 year old female presenting with lower abdominal pain, nausea/vomiting/diarrhea, and now chest pain. Differential diagnosis includes, but is not limited to, ACS, aortic dissection, pulmonary embolism, cardiac tamponade, pneumothorax, pneumonia, pericarditis, myocarditis, GI-related causes including esophagitis/gastritis, and musculoskeletal chest wall pain.   Differential diagnosis includes, but is not limited to, ovarian cyst, ovarian torsion, acute appendicitis, diverticulitis, urinary tract infection/pyelonephritis, endometriosis, bowel obstruction, colitis, renal colic, gastroenteritis, hernia, etc. I personally viewed patient's records and note a PCP office visit on 04/27/2023 for hypoglycemia and chronic medical issues.  She has also recently finished antibiotic for UTI.  Patient's presentation is most consistent with acute presentation with potential threat to life or bodily function.  The patient is on the cardiac monitor to evaluate for evidence of arrhythmia and/or significant heart rate changes.  Laboratory results demonstrate normal WBC of 8, AKI with BUN 40/creatinine 2.22, minimally elevated lipase.  Initial troponin is 61.  Will apply nitroglycerin paste, administer morphine and Zofran, initiate IV fluid resuscitation.  Obtain CT abdomen/pelvis.  Anticipate hospitalization.  Patient self caths; will obtain in/out cath urine.  Clinical Course as of 05/18/23 6962  Caleen Essex May 18, 2023  0145 Updated patient and family members on laboratory results and CT scan.  Patient has had history  of cholecystectomy.  Is not having right upper quadrant abdominal pain.  Will consult hospital services for evaluation and admission. [JS]    Clinical Course User Index [JS] Irean Hong, MD     FINAL CLINICAL IMPRESSION(S) / ED DIAGNOSES   Final diagnoses:  Chest pain, unspecified type  AKI (acute kidney injury) (HCC)  Nausea vomiting and  diarrhea  Dehydration  Cystitis  Elevated troponin     Rx / DC Orders   ED Discharge Orders     None        Note:  This document was prepared using Dragon voice recognition software and may include unintentional dictation errors.   Irean Hong, MD 05/18/23 [phone]

## 2023-05-17 NOTE — Telephone Encounter (Signed)
Ab in date  Requested Prescriptions  Pending Prescriptions Disp Refills   DULoxetine (CYMBALTA) 60 MG capsule [Pharmacy Med Name: DULoxetine HCl 60 MG Oral Capsule Delayed Release Particles] 90 capsule 0    Sig: Take 1 capsule by mouth once daily     Psychiatry: Antidepressants - SNRI - duloxetine Failed - 05/15/2023  6:41 AM      Failed - Cr in normal range and within 360 days    Creatinine  Date Value Ref Range Status  03/01/2012 1.72 (H) 0.60 - 1.30 mg/dL Final   Creatinine, Ser  Date Value Ref Range Status  03/15/2023 1.52 (H) 0.57 - 1.00 mg/dL Final   Creatinine, POC  Date Value Ref Range Status  08/02/2017 NA mg/dL Final         Failed - Last BP in normal range    BP Readings from Last 1 Encounters:  04/27/23 (!) 87/53         Passed - eGFR is 30 or above and within 360 days    EGFR (African American)  Date Value Ref Range Status  03/01/2012 36 (L)  Final   GFR calc Af Amer  Date Value Ref Range Status  10/06/2019 41 (L) >59 mL/min/1.73 Final    Comment:    **[facility] currently reports eGFR in compliance with the current**   recommendations of the [employer]. [facility] will   update reporting as new guidelines are published from the NKF-ASN   Task force.    EGFR (Non-African Amer.)  Date Value Ref Range Status  03/01/2012 31 (L)  Final    Comment:    eGFR values <35mL/min/1.73 m2 may be an indication of chronic kidney disease (CKD). Calculated eGFR is useful in patients with stable renal function. The eGFR calculation will not be reliable in acutely ill patients when serum creatinine is changing rapidly. It is not useful in  patients on dialysis. The eGFR calculation may not be applicable to patients at the low and high extremes of body sizes, pregnant women, and vegetarians.    GFR, Estimated  Date Value Ref Range Status  03/01/2023 31 (L) >60 mL/min Final    Comment:    (NOTE) Calculated using the CKD-EPI Creatinine Equation (2021)     eGFR  Date Value Ref Range Status  03/15/2023 36 (L) >59 mL/min/1.73 Final         Passed - Completed PHQ-2 or PHQ-9 in the last 360 days      Passed - Valid encounter within last 6 months    Recent Outpatient Visits           2 weeks ago Hypoglycemia due to type 2 diabetes mellitus (HCC)   [place] [facility] Simmons-Robinson, [place], MD   2 months ago Flu vaccine need   [place] [facility] Simmons-Robinson, [place], MD   4 months ago Stage 3 chronic kidney disease, unspecified whether stage 3a or 3b CKD (HCC)   [place] [facility] Simmons-Robinson, [place], MD   8 months ago Screening for colon cancer   [place] [facility] Simmons-Robinson, [place], MD   10 months ago SOB (shortness of breath)   [place] [facility] Simmons-Robinson, Tawanna Cooler, MD       Future Appointments             In 3 months Simmons-Robinson, Tawanna Cooler, MD [facility]

## 2023-05-17 NOTE — ED Triage Notes (Signed)
First Nurse Note:  BIB [facility] from home. Pt c/o acute onset chest pain 2000. Central pain described as pressure. Shortly after onset having generalized abd pain and bilateral flank pain after cp onset. Pt took 324 asa and 1 SL nitroglycerin prior to EMS arrival. States to them that pain went from a 5 to a 2 after. Pt is alert and oriented with EMS.   EMS Vitals: 131/53 sitting  94/68 standing HR 72 sitting HR 80 standing  100% RA CBG 142 98.3 temp oral

## 2023-05-18 ENCOUNTER — Inpatient Hospital Stay (HOSPITAL_COMMUNITY)
Admit: 2023-05-18 | Discharge: 2023-05-18 | Disposition: A | Discharge status: Home / Self Care | Payer: Medicare HMO | Attending: Internal Medicine

## 2023-05-18 DIAGNOSIS — I129 Hypertensive chronic kidney disease with stage 1 through stage 4 chronic kidney disease, or unspecified chronic kidney disease: Secondary | ICD-10-CM | POA: Diagnosis not present

## 2023-05-18 DIAGNOSIS — I739 Peripheral vascular disease, unspecified: Secondary | ICD-10-CM | POA: Diagnosis not present

## 2023-05-18 DIAGNOSIS — Z1629 Resistance to other single specified antibiotic: Secondary | ICD-10-CM | POA: Diagnosis not present

## 2023-05-18 DIAGNOSIS — E1169 Type 2 diabetes mellitus with other specified complication: Secondary | ICD-10-CM | POA: Diagnosis not present

## 2023-05-18 DIAGNOSIS — R1032 Left lower quadrant pain: Secondary | ICD-10-CM | POA: Diagnosis not present

## 2023-05-18 DIAGNOSIS — N3000 Acute cystitis without hematuria: Secondary | ICD-10-CM | POA: Diagnosis present

## 2023-05-18 DIAGNOSIS — N179 Acute kidney failure, unspecified: Secondary | ICD-10-CM | POA: Diagnosis not present

## 2023-05-18 DIAGNOSIS — E1151 Type 2 diabetes mellitus with diabetic peripheral angiopathy without gangrene: Secondary | ICD-10-CM | POA: Diagnosis not present

## 2023-05-18 DIAGNOSIS — Z8249 Family history of ischemic heart disease and other diseases of the circulatory system: Secondary | ICD-10-CM | POA: Diagnosis not present

## 2023-05-18 DIAGNOSIS — I2510 Atherosclerotic heart disease of native coronary artery without angina pectoris: Secondary | ICD-10-CM | POA: Diagnosis not present

## 2023-05-18 DIAGNOSIS — T83518A Infection and inflammatory reaction due to other urinary catheter, initial encounter: Secondary | ICD-10-CM | POA: Diagnosis not present

## 2023-05-18 DIAGNOSIS — N170 Acute kidney failure with tubular necrosis: Secondary | ICD-10-CM | POA: Diagnosis not present

## 2023-05-18 DIAGNOSIS — E860 Dehydration: Secondary | ICD-10-CM | POA: Diagnosis not present

## 2023-05-18 DIAGNOSIS — D631 Anemia in chronic kidney disease: Secondary | ICD-10-CM | POA: Diagnosis not present

## 2023-05-18 DIAGNOSIS — E871 Hypo-osmolality and hyponatremia: Secondary | ICD-10-CM | POA: Diagnosis not present

## 2023-05-18 DIAGNOSIS — R112 Nausea with vomiting, unspecified: Secondary | ICD-10-CM | POA: Diagnosis not present

## 2023-05-18 DIAGNOSIS — Z7984 Long term (current) use of oral hypoglycemic drugs: Secondary | ICD-10-CM | POA: Diagnosis not present

## 2023-05-18 DIAGNOSIS — Z833 Family history of diabetes mellitus: Secondary | ICD-10-CM | POA: Diagnosis not present

## 2023-05-18 DIAGNOSIS — E66811 Obesity, class 1: Secondary | ICD-10-CM | POA: Diagnosis present

## 2023-05-18 DIAGNOSIS — Z6832 Body mass index (BMI) 32.0-32.9, adult: Secondary | ICD-10-CM | POA: Diagnosis not present

## 2023-05-18 DIAGNOSIS — I081 Rheumatic disorders of both mitral and tricuspid valves: Secondary | ICD-10-CM | POA: Diagnosis not present

## 2023-05-18 DIAGNOSIS — Z7982 Long term (current) use of aspirin: Secondary | ICD-10-CM | POA: Diagnosis not present

## 2023-05-18 DIAGNOSIS — I2583 Coronary atherosclerosis due to lipid rich plaque: Secondary | ICD-10-CM | POA: Diagnosis not present

## 2023-05-18 DIAGNOSIS — I10 Essential (primary) hypertension: Secondary | ICD-10-CM | POA: Diagnosis not present

## 2023-05-18 DIAGNOSIS — E1142 Type 2 diabetes mellitus with diabetic polyneuropathy: Secondary | ICD-10-CM | POA: Diagnosis not present

## 2023-05-18 DIAGNOSIS — E1122 Type 2 diabetes mellitus with diabetic chronic kidney disease: Secondary | ICD-10-CM | POA: Diagnosis not present

## 2023-05-18 DIAGNOSIS — Z825 Family history of asthma and other chronic lower respiratory diseases: Secondary | ICD-10-CM | POA: Diagnosis not present

## 2023-05-18 DIAGNOSIS — N1832 Chronic kidney disease, stage 3b: Secondary | ICD-10-CM | POA: Diagnosis not present

## 2023-05-18 DIAGNOSIS — E7800 Pure hypercholesterolemia, unspecified: Secondary | ICD-10-CM | POA: Diagnosis not present

## 2023-05-18 DIAGNOSIS — Y846 Urinary catheterization as the cause of abnormal reaction of the patient, or of later complication, without mention of misadventure at the time of the procedure: Secondary | ICD-10-CM | POA: Diagnosis not present

## 2023-05-18 DIAGNOSIS — Z9049 Acquired absence of other specified parts of digestive tract: Secondary | ICD-10-CM | POA: Diagnosis not present

## 2023-05-18 DIAGNOSIS — E785 Hyperlipidemia, unspecified: Secondary | ICD-10-CM | POA: Diagnosis not present

## 2023-05-18 DIAGNOSIS — R079 Chest pain, unspecified: Secondary | ICD-10-CM

## 2023-05-18 DIAGNOSIS — Z794 Long term (current) use of insulin: Secondary | ICD-10-CM | POA: Diagnosis not present

## 2023-05-18 DIAGNOSIS — I959 Hypotension, unspecified: Secondary | ICD-10-CM | POA: Insufficient documentation

## 2023-05-18 DIAGNOSIS — K5730 Diverticulosis of large intestine without perforation or abscess without bleeding: Secondary | ICD-10-CM | POA: Diagnosis not present

## 2023-05-18 DIAGNOSIS — D696 Thrombocytopenia, unspecified: Secondary | ICD-10-CM | POA: Diagnosis not present

## 2023-05-18 DIAGNOSIS — E872 Acidosis, unspecified: Secondary | ICD-10-CM | POA: Diagnosis not present

## 2023-05-18 LAB — LACTIC ACID, PLASMA: Lactic Acid, Venous: 0.9 mmol/L (ref 0.5–1.9)

## 2023-05-18 LAB — URINALYSIS, ROUTINE W REFLEX MICROSCOPIC
Bilirubin Urine: NEGATIVE
Glucose, UA: >=500 mg/dL — AB
Ketones, ur: NEGATIVE mg/dL
Nitrite: NEGATIVE
Protein, ur: NEGATIVE mg/dL
Specific Gravity, Urine: 1.02 (ref 1.005–1.030)
WBC, UA: >50 WBC/hpf (ref 0–5)
pH: 5 (ref 5.0–8.0)

## 2023-05-18 LAB — HEPATIC FUNCTION PANEL
ALT: 12 U/L (ref 0–44)
AST: 18 U/L (ref 15–41)
Albumin: 2.5 g/dL — ABNORMAL LOW (ref 3.5–5.0)
Alkaline Phosphatase: 54 U/L (ref 38–126)
Bilirubin, Direct: 0.1 mg/dL (ref 0.0–0.2)
Indirect Bilirubin: 0.4 mg/dL (ref 0.3–0.9)
Total Bilirubin: 0.5 mg/dL (ref <1.2)
Total Protein: 5.3 g/dL — ABNORMAL LOW (ref 6.5–8.1)

## 2023-05-18 LAB — TROPONIN I (HIGH SENSITIVITY): Troponin I (High Sensitivity): 45 ng/L — ABNORMAL HIGH (ref <18)

## 2023-05-18 MED ORDER — INSULIN GLARGINE 100 UNIT/ML ~~LOC~~ SOLN
20.0000 [IU] | SUBCUTANEOUS | Status: DC
  Filled 2023-05-18: qty 0.2

## 2023-05-18 MED ORDER — PIPERACILLIN-TAZOBACTAM 3.375 G IVPB
3.3750 g | Freq: Three times a day (TID) | INTRAVENOUS | Status: DC
Start: 2023-05-18 — End: 2023-05-19
  Administered 2023-05-18 – 2023-05-19 (×4): 3.375 g via INTRAVENOUS
  Filled 2023-05-18 (×4): qty 50

## 2023-05-18 MED ORDER — ACETAMINOPHEN 325 MG PO TABS
650.0000 mg | ORAL_TABLET | ORAL | Status: DC | PRN
Start: 2023-05-18 — End: 2023-05-21
  Administered 2023-05-20: 650 mg via ORAL
  Filled 2023-05-18: qty 2

## 2023-05-18 MED ORDER — ENOXAPARIN SODIUM 30 MG/0.3ML IJ SOSY
30.0000 mg | PREFILLED_SYRINGE | INTRAMUSCULAR | Status: DC
  Administered 2023-05-18 – 2023-05-21 (×4): 30 mg via SUBCUTANEOUS
  Filled 2023-05-18 (×4): qty 0.3

## 2023-05-18 MED ORDER — PIPERACILLIN-TAZOBACTAM 3.375 G IVPB 30 MIN
3.3750 g | Freq: Once | INTRAVENOUS | Status: AC
  Administered 2023-05-18: 3.375 g via INTRAVENOUS
  Filled 2023-05-18 (×2): qty 50

## 2023-05-18 MED ORDER — PRAVASTATIN SODIUM 20 MG PO TABS
10.0000 mg | ORAL_TABLET | Freq: Every day | ORAL | Status: DC
  Administered 2023-05-18 – 2023-05-20 (×3): 10 mg via ORAL
  Filled 2023-05-18 (×3): qty 1

## 2023-05-18 MED ORDER — DULOXETINE HCL 30 MG PO CPEP
60.0000 mg | ORAL_CAPSULE | Freq: Every day | ORAL | Status: DC
  Administered 2023-05-18 – 2023-05-21 (×4): 60 mg via ORAL
  Filled 2023-05-18 (×2): qty 1
  Filled 2023-05-18 (×2): qty 2

## 2023-05-18 MED ORDER — NITROGLYCERIN 0.4 MG SL SUBL: 0.4000 mg | SUBLINGUAL_TABLET | SUBLINGUAL | Status: DC | PRN

## 2023-05-18 MED ORDER — PANTOPRAZOLE SODIUM 40 MG PO TBEC
40.0000 mg | DELAYED_RELEASE_TABLET | Freq: Every day | ORAL | Status: DC
  Administered 2023-05-18 – 2023-05-21 (×4): 40 mg via ORAL
  Filled 2023-05-18 (×4): qty 1

## 2023-05-18 MED ORDER — ENOXAPARIN SODIUM 40 MG/0.4ML IJ SOSY
40.0000 mg | PREFILLED_SYRINGE | INTRAMUSCULAR | Status: DC
  Filled 2023-05-18: qty 0.4

## 2023-05-18 MED ORDER — ONDANSETRON HCL 4 MG/2ML IJ SOLN: 4.0000 mg | Freq: Four times a day (QID) | INTRAMUSCULAR | Status: DC | PRN

## 2023-05-18 MED ORDER — LACTATED RINGERS IV SOLN: 150.0000 mL/h | INTRAVENOUS | Status: DC

## 2023-05-18 MED ORDER — SODIUM CHLORIDE 0.9 % IV SOLN: INTRAVENOUS | Status: AC

## 2023-05-18 MED ORDER — SODIUM CHLORIDE 0.9 % IV BOLUS
1000.0000 mL | Freq: Once | INTRAVENOUS | Status: AC
  Administered 2023-05-18: 1000 mL via INTRAVENOUS

## 2023-05-18 MED ORDER — CLOPIDOGREL BISULFATE 75 MG PO TABS
75.0000 mg | ORAL_TABLET | Freq: Every day | ORAL | Status: DC
  Administered 2023-05-18 – 2023-05-21 (×4): 75 mg via ORAL
  Filled 2023-05-18 (×4): qty 1

## 2023-05-18 MED ORDER — ASPIRIN 81 MG PO TBEC
81.0000 mg | DELAYED_RELEASE_TABLET | Freq: Every day | ORAL | Status: DC
  Administered 2023-05-19 – 2023-05-21 (×2): 81 mg via ORAL
  Filled 2023-05-18 (×3): qty 1

## 2023-05-18 MED ORDER — INSULIN GLARGINE-YFGN 100 UNIT/ML ~~LOC~~ SOLN
20.0000 [IU] | Freq: Every day | SUBCUTANEOUS | Status: DC
  Administered 2023-05-18 – 2023-05-19 (×2): 20 [IU] via SUBCUTANEOUS
  Filled 2023-05-18 (×2): qty 0.2

## 2023-05-18 MED ORDER — SODIUM CHLORIDE 0.9 % IV BOLUS
1000.0000 mL | Freq: Once | INTRAVENOUS | Status: AC
Start: 2023-05-18 — End: 2023-05-18
  Administered 2023-05-18: 1000 mL via INTRAVENOUS

## 2023-05-18 NOTE — Assessment & Plan Note (Deleted)
Essential hypertension Not meeting sepsis criteria at this time, possibly dehydration from recent gastrointestinal illness Continue IV fluids Will get a lactic acid

## 2023-05-18 NOTE — Assessment & Plan Note (Deleted)
 Continue insulin sliding scale for glucose cover and monitoring  Fasting glucose today is 83 mg/dl.

## 2023-05-18 NOTE — Progress Notes (Signed)
PHARMACIST - PHYSICIAN COMMUNICATION  CONCERNING:  Enoxaparin (Lovenox) for DVT Prophylaxis   RECOMMENDATION: Patient was prescribed enoxaparin 40mg  q24 hours for VTE prophylaxis.   Filed Weights   05/17/23 2138  Weight: 77 kg (169 lb 12.1 oz)   Body mass index is 32.07 kg/m.  Estimated Creatinine Clearance: 20.9 mL/min (A) (by C-G formula based on SCr of 2.22 mg/dL (H)).  Patient is candidate for enoxaparin 30mg  every 24 hours based on CrCl < 30 mL/min.   DESCRIPTION: Pharmacy has adjusted enoxaparin dose per [facility] policy.  Patient is now receiving enoxaparin 30 mg every 24 hours.  Littie Deeds, PharmD Pharmacy Resident  05/18/2023 9:17 AM

## 2023-05-18 NOTE — ED Notes (Signed)
Pt gave this nurse permission to discuss health and medical information with daughter Oretha Abby

## 2023-05-18 NOTE — Progress Notes (Signed)
Pharmacy Antibiotic Note  Cynthia Sims is a 72 y.o. female admitted on 05/17/2023 with UTI.  Pharmacy has been consulted for Zosyn dosing.  Plan: Zosyn 3.375 gm IV X 1 given in ED over 30 min on 12/6 @ 0211. Zosyn 3.375 gm IV Q8H EI ordered to start on 12/6 @ 0800.     Height: 5\' 1"  (154.9 cm) Weight: 77 kg (169 lb 12.1 oz) IBW/kg (Calculated) : 47.8  Temp (24hrs), Avg:97.9 F (36.6 C), Min:97.9 F (36.6 C), Max:97.9 F (36.6 C)  Recent Labs  Lab 05/17/23 2152  WBC 8.0  CREATININE 2.22*    Estimated Creatinine Clearance: 20.9 mL/min (A) (by C-G formula based on SCr of 2.22 mg/dL (H)).    No Known Allergies  Antimicrobials this admission:   >>    >>   Dose adjustments this admission:   Microbiology results:  BCx:   UCx:    Sputum:    MRSA PCR:   Thank you for allowing pharmacy to be a part of this patient's care.  Tery Hoeger D 05/18/2023 3:38 AM

## 2023-05-18 NOTE — Assessment & Plan Note (Addendum)
 Hyponatremia.  Stable renal function with serum cr at 1,51 with K at 4,1 and serum bicarbonate at 17 Na 136   Continue close monitoring renal function and electrolytes .

## 2023-05-18 NOTE — Progress Notes (Signed)
*  PRELIMINARY RESULTS* Echocardiogram 2D Echocardiogram has been performed.  Cynthia Sims 05/18/2023, 2:53 PM

## 2023-05-18 NOTE — ED Notes (Signed)
Echo at bedside

## 2023-05-18 NOTE — ED Notes (Signed)
This nurse called Marylene Land daughter [phone]) for update

## 2023-05-18 NOTE — ED Notes (Signed)
Pt set up to eat breakfast, NAD, Call light within reach.

## 2023-05-18 NOTE — Progress Notes (Signed)
No charge progress note.   Cynthia Sims is a 72 y.o. female with medical history significant for DM2, CKD 3b, chronic urinary retention who self caths, recurrent UTI, CAD, HTN, PVD, OSA on CPAP, admitted in September 2024 with hypoglycemia and AKI on CKD with pyelectasis on renal ultrasound who presents to the ED, with acute onset chest pain as well as bilateral flank pain.  In the prior 2 weeks she had been having intermittent abdominal pain,  vomiting and diarrhea.  The vomiting has now resolved but she has baseline chronic diarrhea which persists.  On arrival to ED she was hypotensive at 86/55, fluid responsive to 111/46. Labs with baseline CBC, creatinine at 2.22, baseline of 1.5, bicarb 18, lipase 74, troponin 45.  UA with large leukocytes and many bacteria. EKG with NSR, no acute ST-T changes. Chest x-ray without any acute abnormality. CT abdomen and pelvis with contrast showing cystitis among other findings as outlined below: IMPRESSION: 1. Mild inflammatory stranding surrounding the bladder worrisome for cystitis. 2. New pneumobilia in the left lobe of the liver. Please correlate with surgical history of prior sphincterotomy. 3. Sigmoid colon diverticulosis. 4. Aortic atherosclerosis.   Patient treated with NS boluses, morphine and Zofran and started on Zosyn.  12/6: Vital stable, pending culture.  Most recent urine culture with Citrobacter species, shows only resistance to Bactrim.  Patient doing in and out catheter for many year and follow-up with urology.  Currently at baseline with no new concern. Might need prolonged suppressive course of antibiotics if cultures are positive.  Her exam was completely benign.  Daughter was updated at bedside. -Will continue current management and de-escalate antibiotics once culture results are available.

## 2023-05-18 NOTE — Assessment & Plan Note (Addendum)
Troponin elevation due to infection, she ruled out for acute coronary syndrome.  Continue lovastatin, aspirin, clopidogrel and carvedilol.

## 2023-05-18 NOTE — ED Notes (Signed)
Called pharmacy to tube Lovenox

## 2023-05-18 NOTE — ED Notes (Signed)
B/P cuff readjusted to arm. Bp improved.

## 2023-05-18 NOTE — ED Notes (Signed)
Family at bedside. R wrist Iv displaced and bleeding upon assessment. Fluids reconnected to L wrist

## 2023-05-18 NOTE — Hospital Course (Addendum)
 Mrs. Cynthia Sims cystitis.   72 y.o. female with medical history significant for DM2, CKD 3b, chronic urinary retention who self caths, recurrent UTI, CAD, HTN, PVD, OSA on CPAP, who presented with acute onset chest pain as well as bilateral flank pain.  In the prior 2 weeks she had been having intermittent abdominal pain,  vomiting and diarrhea.   On arrival to ED she was hypotensive at 86/55, fluid responsive to 111/46. Labs with baseline CBC, creatinine at 2.22, baseline of 1.5, bicarb 18, lipase 74, troponin 45.   UA with large leukocytes and many bacteria. EKG with NSR, no acute ST-T changes. Chest x-ray without any acute abnormality. CT abdomen and pelvis with contrast>> ystitis among other findings as outlined below: IMPRESSION: 1. Mild inflammatory stranding surrounding the bladder worrisome for cystitis. 2. New pneumobilia in the left lobe of the liver. Please correlate with surgical history of prior sphincterotomy. 3. Sigmoid colon diverticulosis. 4. Aortic atherosclerosis.   Patient treated with NS boluses, morphine, Zofran and started on IV Zosyn.Most recent urine culture with Citrobacter species, shows only resistance to Bactrim. Patient doing in and out catheter for many year and follow-up with urology. Might need prolonged suppressive course of antibiotics if cultures are positive. Blood culture negative.  Unfortunately no urine culture from admission, but has been clinically improved and stable 12/09> labs shows improved creatinine 1.1 CBC with chronic anemia and thrombocytopenia, patient has been doing well on cephalosporin She feels well and feels ready for discharge home with husband and granddaughters

## 2023-05-18 NOTE — H&P (Signed)
History and Physical    [name] DOB: 11/01/1950 DOA: 05/17/2023 DOS: the patient was seen and examined on 05/18/2023 PCP: [name], MD  Patient coming from: Home  Chief Complaint:  Chief Complaint  Patient presents with   Abdominal Pain    HPI: Cynthia Sims is a 72 y.o. female with medical history significant for DM2, CKD 3b, chronic urinary retention who self caths, recurrent UTI, CAD, HTN, PVD, OSA on CPAP, admitted in September 2024 with hypoglycemia and AKI on CKD with pyelectasis on renal ultrasound who presents to the ED, with acute onset chest pain as well as bilateral flank pain.  In the prior 2 weeks she had been having intermittent abdominal pain,  vomiting and diarrhea.  The vomiting has now resolved but she has baseline chronic diarrhea which persists.  She denies cough, fever or chills or shortness of breath, palpitations or lightheadedness.  Prior to arrival she took an aspirin and nitroglycerin with improvement in chest pain from a 5 out of 10 to 2 out of 10. ED course and data review: Hypotensive to 86/55 on arrival with otherwise normal vitals.  Fluid responsive to 111/46. Labs notable for normal WBC, hemoglobin at baseline of 10.9, creatinine 2.22 up from baseline of 1.5 to with bicarb 18, lipase 74, LFTs pending. Troponin 45 Urinalysis with large leukocyte and many bacteria EKG, personally viewed and interpreted showing NSR at 78 with no acute ST-T wave changes. Chest x-ray nonacute CT abdomen and pelvis with contrast showing cystitis among other findings as outlined below: IMPRESSION: 1. Mild inflammatory stranding surrounding the bladder worrisome for cystitis. 2. New pneumobilia in the left lobe of the liver. Please correlate with surgical history of prior sphincterotomy. 3. Sigmoid colon diverticulosis. 4. Aortic atherosclerosis.  Patient treated with NS boluses, morphine and Zofran and started on  [facility] consulted for admission.   Review of Systems: As mentioned in the history of present illness. All other systems reviewed and are negative.  Past Medical History:  Diagnosis Date   ALT (SGPT) level raised    Anemia    Anxiety    Arthritis    Depression    Diabetes mellitus without complication (HCC)    Elevated cholesterol    GERD (gastroesophageal reflux disease)    Hemorrhoids    Hypertension    Kidney disease    Past Surgical History:  Procedure Laterality Date   abdominal blockage     CESAREAN SECTION  1983   CESAREAN SECTION     CHOLECYSTECTOMY     COLONOSCOPY WITH PROPOFOL N/A 10/29/2015   Procedure: COLONOSCOPY WITH PROPOFOL;  Surgeon: Scot Jun, MD;  Location: [facility] ENDOSCOPY;  Service: Endoscopy;  Laterality: N/A;   COLONOSCOPY WITH PROPOFOL N/A 11/07/2017   Procedure: COLONOSCOPY WITH PROPOFOL;  Surgeon: Toledo, Boykin Nearing, MD;  Location: [facility] ENDOSCOPY;  Service: Gastroenterology;  Laterality: N/A;   DILATION AND CURETTAGE OF UTERUS  2001   ESOPHAGOGASTRODUODENOSCOPY (EGD) WITH PROPOFOL N/A 10/29/2015   Procedure: ESOPHAGOGASTRODUODENOSCOPY (EGD) WITH PROPOFOL;  Surgeon: Scot Jun, MD;  Location: [facility] ENDOSCOPY;  Service: Endoscopy;  Laterality: N/A;   LOWER EXTREMITY ANGIOGRAPHY Right 10/24/2022   Procedure: Lower Extremity Angiography;  Surgeon: Renford Dills, MD;  Location: [facility] INVASIVE CV LAB;  Service: Cardiovascular;  Laterality: Right;   LOWER EXTREMITY ANGIOGRAPHY Left 10/31/2022   Procedure: Lower Extremity Angiography;  Surgeon: Renford Dills, MD;  Location: [facility] INVASIVE CV LAB;  Service: Cardiovascular;  Laterality: Left;   Social  History:  reports that she has never smoked. She has never been exposed to tobacco smoke. She has never used smokeless tobacco. No history on file for alcohol use and drug use.  No Known Allergies  Family History  Problem Relation Age of Onset   Alzheimer's disease Mother    COPD Mother     Breast cancer Mother 49   Osteoporosis Mother    Heart attack Father    Lupus Father    Diabetes Father    Hypertension Father    Diabetes Sister    COPD Maternal Grandmother    Diabetes Paternal Grandfather    Diabetes Sister    Diabetes Sister     Prior to Admission medications   Medication Sig Start Date End Date Taking? Authorizing Provider  acetaminophen (TYLENOL) 500 MG tablet Take 500 mg by mouth every 6 (six) hours as needed.     [provider]  aspirin EC 81 MG tablet Take 81 mg by mouth daily.     [provider]  carvedilol (COREG) 25 MG tablet Take 1 tablet by mouth twice daily 01/05/23   Mealor, Roberts Gaudy, MD  cholestyramine (QUESTRAN) 4 g packet DISSOLVE AND TAKE ONE PACKET BY MOUTH THREE TIMES A DAY 03/12/23   Ostwalt, Janna, PA-C  clopidogrel (PLAVIX) 75 MG tablet Take 1 tablet by mouth once daily 04/17/23   Schnier, Latina Craver, MD  cyanocobalamin 1000 MCG tablet Take 1,000 mcg by mouth daily.    [provider]  DULoxetine (CYMBALTA) 60 MG capsule Take 1 capsule by mouth once daily 05/17/23   Simmons-Robinson, Makiera, MD  gabapentin (NEURONTIN) 100 MG capsule TAKE 1 CAPSULE BY MOUTH THREE TIMES DAILY 01/19/23   Simmons-Robinson, Makiera, MD  Insulin Glargine (BASAGLAR KWIKPEN) 100 UNIT/ML INJECT 24 UNITS SUBCUTANEOUSLY ONCE DAILY 04/24/23   Simmons-Robinson, Makiera, MD  irbesartan (AVAPRO) 75 MG tablet Take 1 tablet (75 mg total) by mouth daily. 03/15/23   Simmons-Robinson, Tawanna Cooler, MD  isosorbide mononitrate (IMDUR) 30 MG 24 hr tablet Take 1 tablet (30 mg total) by mouth daily. 09/20/22   Sharlene Dory, PA-C  lovastatin (MEVACOR) 40 MG tablet Take 1 tablet by mouth once daily 03/06/23   Ostwalt, Edmon Crape, PA-C  nitroGLYCERIN (NITROSTAT) 0.4 MG SL tablet Place 1 tablet (0.4 mg total) under the tongue every 5 (five) minutes as needed for chest pain. 07/13/22   Chandrasekhar, Lafayette Dragon A, MD  ondansetron (ZOFRAN-ODT) 4 MG disintegrating tablet Take 1 tablet  (4 mg total) by mouth every 8 (eight) hours as needed for nausea or vomiting. 04/27/23   Simmons-Robinson, Makiera, MD  pantoprazole (PROTONIX) 40 MG tablet Take 1 tablet (40 mg total) by mouth daily. 10/25/22 10/25/23  Schnier, Latina Craver, MD  tirzepatide [facility]) 2.5 MG/0.5ML Pen INJECT 2.5MG  INTO THE SKIN ONCE WEEKLY 03/19/23   Christell Constant, MD    Physical Exam: Vitals:   05/18/23 0115 05/18/23 0130 05/18/23 0215 05/18/23 0300  BP: (!) 91/43 (!) 96/41 (!) 111/46 (!) 91/45  Pulse: 88 87 86 87  Resp: 13 13 20 12   Temp:      TempSrc:      SpO2: 99% 98% 98% 94%  Weight:      Height:       Physical Exam Vitals and nursing note reviewed.  Constitutional:      General: She is not in acute distress.    Appearance: She is ill-appearing.  HENT:     Head: Normocephalic and atraumatic.  Cardiovascular:  Rate and Rhythm: Normal rate and regular rhythm.     Heart sounds: Normal heart sounds.  Pulmonary:     Effort: Pulmonary effort is normal.     Breath sounds: Normal breath sounds.  Abdominal:     Palpations: Abdomen is soft.     Tenderness: There is no abdominal tenderness.  Neurological:     Mental Status: Mental status is at baseline.     Labs on Admission: I have personally reviewed following labs and imaging studies  CBC: Recent Labs  Lab 05/17/23 2152  WBC 8.0  HGB 10.9*  HCT 33.8*  MCV 90.9  PLT 132*   Basic Metabolic Panel: Recent Labs  Lab 05/17/23 2152  NA 133*  K 4.3  CL 104  CO2 18*  GLUCOSE 157*  BUN 40*  CREATININE 2.22*  CALCIUM 8.6*   GFR: Estimated Creatinine Clearance: 20.9 mL/min (A) (by C-G formula based on SCr of 2.22 mg/dL (H)). Liver Function Tests: No results for input(s): "AST", "ALT", "ALKPHOS", "BILITOT", "PROT", "ALBUMIN" in the last 168 hours. Recent Labs  Lab 05/17/23 2152  LIPASE 74*   No results for input(s): "AMMONIA" in the last 168 hours. Coagulation Profile: No results for input(s): "INR", "PROTIME" in the  last 168 hours. Cardiac Enzymes: No results for input(s): "CKTOTAL", "CKMB", "CKMBINDEX", "TROPONINI" in the last 168 hours. BNP (last 3 results) Recent Labs    01/30/23 1156  PROBNP 395*   HbA1C: No results for input(s): "HGBA1C" in the last 72 hours. CBG: No results for input(s): "GLUCAP" in the last 168 hours. Lipid Profile: No results for input(s): "CHOL", "HDL", "LDLCALC", "TRIG", "CHOLHDL", "LDLDIRECT" in the last 72 hours. Thyroid Function Tests: No results for input(s): "TSH", "T4TOTAL", "FREET4", "T3FREE", "THYROIDAB" in the last 72 hours. Anemia Panel: No results for input(s): "VITAMINB12", "FOLATE", "FERRITIN", "TIBC", "IRON", "RETICCTPCT" in the last 72 hours. Urine analysis:    Component Value Date/Time   COLORURINE YELLOW (A) 05/18/2023 0011   APPEARANCEUR CLOUDY (A) 05/18/2023 0011   APPEARANCEUR Turbid (A) 04/27/2023 0000   LABSPEC 1.020 05/18/2023 0011   PHURINE 5.0 05/18/2023 0011   GLUCOSEU >=500 (A) 05/18/2023 0011   HGBUR SMALL (A) 05/18/2023 0011   BILIRUBINUR NEGATIVE 05/18/2023 0011   BILIRUBINUR Negative 04/27/2023 0000   KETONESUR NEGATIVE 05/18/2023 0011   PROTEINUR NEGATIVE 05/18/2023 0011   UROBILINOGEN 0.2 02/24/2019 1123   NITRITE NEGATIVE 05/18/2023 0011   LEUKOCYTESUR LARGE (A) 05/18/2023 0011    Radiological Exams on Admission: CT ABDOMEN PELVIS WO CONTRAST  Result Date: 05/18/2023 CLINICAL DATA:  Left lower quadrant pain EXAM: CT ABDOMEN AND PELVIS WITHOUT CONTRAST TECHNIQUE: Multidetector CT imaging of the abdomen and pelvis was performed following the standard protocol without IV contrast. RADIATION DOSE REDUCTION: This exam was performed according to the departmental dose-optimization program which includes automated exposure control, adjustment of the mA and/or kV according to patient size and/or use of iterative reconstruction technique. COMPARISON:  CT abdomen and pelvis 01/17/2023 FINDINGS: Lower chest: No acute abnormality.  Hepatobiliary: Patient is status post cholecystectomy. There is pneumobilia in the left lobe of the liver which is new from prior. No focal liver lesions are seen. No biliary ductal dilatation. Pancreas: Unremarkable. No pancreatic ductal dilatation or surrounding inflammatory changes. Spleen: Normal in size without focal abnormality. Adrenals/Urinary Tract: Bladder is decompressed. There is mild inflammatory stranding surrounding the bladder. There is no hydronephrosis or focal renal lesion. The adrenal glands are within normal limits. Stomach/Bowel: Stomach is within normal limits. Appendix appears normal. No  evidence of bowel wall thickening, distention, or inflammatory changes. There is sigmoid colon diverticulosis. Vascular/Lymphatic: Aortic atherosclerosis. No enlarged abdominal or pelvic lymph nodes. Reproductive: Uterus and bilateral adnexa are unremarkable. Other: No abdominal wall hernia or abnormality. No abdominopelvic ascites. Musculoskeletal: No acute or significant osseous findings. IMPRESSION: 1. Mild inflammatory stranding surrounding the bladder worrisome for cystitis. 2. New pneumobilia in the left lobe of the liver. Please correlate with surgical history of prior sphincterotomy. 3. Sigmoid colon diverticulosis. 4. Aortic atherosclerosis. Aortic Atherosclerosis (ICD10-I70.0). Electronically Signed   By: Darliss Cheney M.D.   On: 05/18/2023 00:54   DG Chest 2 View  Result Date: 05/17/2023 CLINICAL DATA:  chest pain EXAM: CHEST - 2 VIEW COMPARISON:  06/09/2022. FINDINGS: Cardiac silhouette is unremarkable. No pneumothorax or pleural effusion. The lungs are clear. Aorta is calcified. The visualized skeletal structures are unremarkable. IMPRESSION: No acute cardiopulmonary process. Electronically Signed   By: Layla Maw M.D.   On: 05/17/2023 22:23     Data Reviewed: Relevant notes from primary care and specialist visits, past discharge summaries as available in EHR, including Care  Everywhere. Prior diagnostic testing as pertinent to current admission diagnoses Updated medications and problem lists for reconciliation ED course, including vitals, labs, imaging, treatment and response to treatment Triage notes, nursing and pharmacy notes and ED provider's notes Notable results as noted in HPI   Assessment and Plan: * Acute cystitis Chronic urinary retention Recurrent UTIs Continue Zosyn Follow cultures In-N-Out cath 4 times daily by nursing per patient preference  Coronary artery disease Chest pain with elevated troponin Troponin of 45, EKG nonacute Continue lovastatin and aspirin Hold carvedilol, Imdur and irbesartan due to hypotension Continue to monitor and trend troponin Echo to evaluate for wall motion abnormality  Hypotension Essential hypertension Not meeting sepsis criteria at this time, possibly dehydration from recent gastrointestinal illness Continue IV fluids Will get a lactic acid  DM type 2 (diabetes mellitus, type 2) (HCC) Sliding scale insulin coverage Continue basal insulin  Acute renal failure superimposed on stage 3b chronic kidney disease (HCC) Metabolic acidosis Secondary to ATN from renal hypoperfusion related to hypotension, possibly from dehydration related to recent gastrointestinal illness Sepsis criteria not met at this time IV hydration Continue to monitor and avoid nephrotoxins     DVT prophylaxis: Lovenox  Consults: none  Advance Care Planning:   Code Status: Prior   Family Communication: none  Disposition Plan: Back to previous home environment  Severity of Illness: The appropriate patient status for this patient is OBSERVATION. Observation status is judged to be reasonable and necessary in order to provide the required intensity of service to ensure the patient's safety. The patient's presenting symptoms, physical exam findings, and initial radiographic and laboratory data in the context of their medical  condition is felt to place them at decreased risk for further clinical deterioration. Furthermore, it is anticipated that the patient will be medically stable for discharge from the hospital within 2 midnights of admission.   Author: Andris Baumann, MD 05/18/2023 3:19 AM  For on call review [email or web address].

## 2023-05-18 NOTE — Assessment & Plan Note (Addendum)
Chronic urinary retention Recurrent UTIs Urine culture positive for citrobacter.   Plan to change antibiotic therapy to ceftriaxone Continue in and out bladder catheterization Will need outpatient follow up

## 2023-05-19 DIAGNOSIS — N179 Acute kidney failure, unspecified: Secondary | ICD-10-CM | POA: Diagnosis not present

## 2023-05-19 DIAGNOSIS — I2510 Atherosclerotic heart disease of native coronary artery without angina pectoris: Secondary | ICD-10-CM | POA: Diagnosis not present

## 2023-05-19 DIAGNOSIS — I10 Essential (primary) hypertension: Secondary | ICD-10-CM

## 2023-05-19 DIAGNOSIS — E1142 Type 2 diabetes mellitus with diabetic polyneuropathy: Secondary | ICD-10-CM

## 2023-05-19 DIAGNOSIS — N1832 Chronic kidney disease, stage 3b: Secondary | ICD-10-CM

## 2023-05-19 DIAGNOSIS — N3000 Acute cystitis without hematuria: Secondary | ICD-10-CM

## 2023-05-19 DIAGNOSIS — I739 Peripheral vascular disease, unspecified: Secondary | ICD-10-CM

## 2023-05-19 DIAGNOSIS — I2583 Coronary atherosclerosis due to lipid rich plaque: Secondary | ICD-10-CM

## 2023-05-19 DIAGNOSIS — E1169 Type 2 diabetes mellitus with other specified complication: Secondary | ICD-10-CM

## 2023-05-19 DIAGNOSIS — E785 Hyperlipidemia, unspecified: Secondary | ICD-10-CM

## 2023-05-19 LAB — ECHOCARDIOGRAM COMPLETE
AR max vel: 2.34 cm2
AV Area VTI: 2.6 cm2
AV Area mean vel: 2.73 cm2
AV Mean grad: 4 mm[Hg]
AV Peak grad: 7 mm[Hg]
Ao pk vel: 1.32 m/s
Area-P 1/2: 2.63 cm2
Height: 61 in
MV VTI: 2.42 cm2
S' Lateral: 1.7 cm
Weight: 2716.07 [oz_av]

## 2023-05-19 LAB — BASIC METABOLIC PANEL
Anion gap: 7 (ref 5–15)
BUN: 20 mg/dL (ref 8–23)
CO2: 17 mmol/L — ABNORMAL LOW (ref 22–32)
Calcium: 7.7 mg/dL — ABNORMAL LOW (ref 8.9–10.3)
Chloride: 111 mmol/L (ref 98–111)
Creatinine, Ser: 1.59 mg/dL — ABNORMAL HIGH (ref 0.44–1.00)
GFR, Estimated: 34 mL/min — ABNORMAL LOW (ref >60)
Glucose, Bld: 139 mg/dL — ABNORMAL HIGH (ref 70–99)
Potassium: 4.2 mmol/L (ref 3.5–5.1)
Sodium: 135 mmol/L (ref 135–145)

## 2023-05-19 LAB — GLUCOSE, CAPILLARY: Glucose-Capillary: 90 mg/dL (ref 70–99)

## 2023-05-19 LAB — LIPOPROTEIN A (LPA): Lipoprotein (a): 8.9 nmol/L (ref <75.0)

## 2023-05-19 MED ORDER — GABAPENTIN 100 MG PO CAPS
100.0000 mg | ORAL_CAPSULE | Freq: Three times a day (TID) | ORAL | Status: DC
  Administered 2023-05-19 – 2023-05-21 (×6): 100 mg via ORAL
  Filled 2023-05-19 (×6): qty 1

## 2023-05-19 MED ORDER — INSULIN ASPART 100 UNIT/ML IJ SOLN
0.0000 [IU] | Freq: Three times a day (TID) | INTRAMUSCULAR | Status: DC
  Administered 2023-05-20 – 2023-05-21 (×2): 1 [IU] via SUBCUTANEOUS
  Filled 2023-05-19 (×2): qty 1

## 2023-05-19 MED ORDER — ISOSORBIDE MONONITRATE ER 30 MG PO TB24
30.0000 mg | ORAL_TABLET | Freq: Every day | ORAL | Status: DC
  Administered 2023-05-19 – 2023-05-21 (×3): 30 mg via ORAL
  Filled 2023-05-19 (×3): qty 1

## 2023-05-19 MED ORDER — VITAMIN B-12 1000 MCG PO TABS
1000.0000 ug | ORAL_TABLET | Freq: Every day | ORAL | Status: DC
  Administered 2023-05-19 – 2023-05-21 (×3): 1000 ug via ORAL
  Filled 2023-05-19 (×3): qty 1

## 2023-05-19 MED ORDER — CHLORHEXIDINE GLUCONATE CLOTH 2 % EX PADS
6.0000 | MEDICATED_PAD | Freq: Every day | CUTANEOUS | Status: DC
Start: 2023-05-19 — End: 2023-05-21
  Administered 2023-05-19 – 2023-05-21 (×3): 6 via TOPICAL
  Filled 2023-05-19: qty 6

## 2023-05-19 MED ORDER — INSULIN GLARGINE-YFGN 100 UNIT/ML ~~LOC~~ SOLN
10.0000 [IU] | Freq: Every day | SUBCUTANEOUS | Status: DC
Start: 2023-05-20 — End: 2023-05-21
  Administered 2023-05-20 – 2023-05-21 (×2): 10 [IU] via SUBCUTANEOUS
  Filled 2023-05-19 (×2): qty 0.1

## 2023-05-19 MED ORDER — SODIUM CHLORIDE 0.9 % IV SOLN
1.0000 g | INTRAVENOUS | Status: DC
  Administered 2023-05-19 – 2023-05-20 (×2): 1 g via INTRAVENOUS
  Filled 2023-05-19 (×3): qty 10

## 2023-05-19 MED ORDER — CARVEDILOL 25 MG PO TABS
25.0000 mg | ORAL_TABLET | Freq: Two times a day (BID) | ORAL | Status: DC
  Administered 2023-05-20 – 2023-05-21 (×3): 25 mg via ORAL
  Filled 2023-05-19 (×4): qty 1

## 2023-05-19 MED ORDER — ASPIRIN 81 MG PO TBEC
81.0000 mg | DELAYED_RELEASE_TABLET | Freq: Every day | ORAL | Status: DC
  Administered 2023-05-20: 81 mg via ORAL
  Filled 2023-05-19 (×2): qty 1

## 2023-05-19 NOTE — Assessment & Plan Note (Signed)
Systolic blood pressure 140 to 150 mHg.  Continue blood pressure control with carvedilol and isosorbide.

## 2023-05-19 NOTE — Assessment & Plan Note (Addendum)
 Continue blood pressure control and statin therapy. Patient on aspirin and clopidogrel.

## 2023-05-19 NOTE — Progress Notes (Signed)
Progress Note   [name] UJW:[id number] DOB: 1951-04-03 DOA: 05/17/2023     1 DOS: the patient was seen and examined on 05/19/2023   Brief hospital course: Mrs. Cynthia Sims cystitis.    72 y.o. female with medical history significant for DM2, CKD 3b, chronic urinary retention who self caths, recurrent UTI, CAD, HTN, PVD, OSA on CPAP, admitted in September 2024 with hypoglycemia and AKI on CKD with pyelectasis on renal ultrasound who presents to the ED, with acute onset chest pain as well as bilateral flank pain.  In the prior 2 weeks she had been having intermittent abdominal pain,  vomiting and diarrhea.  The vomiting has now resolved but she has baseline chronic diarrhea which persists.  On arrival to ED she was hypotensive at 86/55, fluid responsive to 111/46. Labs with baseline CBC, creatinine at 2.22, baseline of 1.5, bicarb 18, lipase 74, troponin 45.  UA with large leukocytes and many bacteria. EKG with NSR, no acute ST-T changes. Chest x-ray without any acute abnormality. CT abdomen and pelvis with contrast showing cystitis among other findings as outlined below: IMPRESSION: 1. Mild inflammatory stranding surrounding the bladder worrisome for cystitis. 2. New pneumobilia in the left lobe of the liver. Please correlate with surgical history of prior sphincterotomy. 3. Sigmoid colon diverticulosis. 4. Aortic atherosclerosis.   Patient treated with NS boluses, morphine and Zofran and started on Zosyn.  12/6: Vital stable, pending culture.  Most recent urine culture with Citrobacter species, shows only resistance to Bactrim.  Patient doing in and out catheter for many year and follow-up with urology. Might need prolonged suppressive course of antibiotics if cultures are positive.    Assessment and Plan: * Acute cystitis Chronic urinary retention Recurrent UTIs Urine culture positive for citrobacter.   Plan to change antibiotic therapy to  ceftriaxone Continue in and out bladder catheterization Will need outpatient follow up   Coronary artery disease Chest pain with elevated troponin. Ruled out for acute coronary syndrome.  Troponin of 45, EKG nonacute Continue lovastatin and aspirin Resume clopidogrel and carvedilol.     DM type 2 (diabetes mellitus, type 2) (HCC) Continue insulin sliding scale for glucose cover and monitoring  Fasting glucose today is 157 mg/dl.    Acute renal failure superimposed on stage 3b chronic kidney disease (HCC) Hyponatremia.  Will check renal function and electrolytes today.  Correct electrolytes as indicated.  Avoid hypotension and nephrotoxic medications.   Essential hypertension Systolic blood pressure 140 to 150 mHg.  Continue blood pressure control with carvedilol and isosorbide.    PAD (peripheral artery disease) (HCC) Continue blood pressure control and statin therapy.  Type 2 diabetes mellitus with hyperlipidemia (HCC) Continue insulin sliding scale for glucose cover and monitoring.  Decrease basal insulin to 10 units to avoid hypoglycemia.   Continue with statin therapy.         Subjective: Patient with persistent pelvic pain, continue very weak and deconditioned.   Physical Exam: Vitals:   05/19/23 0600 05/19/23 0900 05/19/23 1153 05/19/23 1601  BP: (!) 124/51 122/62 (!) 146/59 (!) 146/51  Pulse: 64 68 (!) 59 60  Resp: 14 16 18 16   Temp: 97.8 F (36.6 C) 98.2 F (36.8 C)  97.9 F (36.6 C)  TempSrc:  Oral Oral Oral  SpO2: 97% 98% 99% 100%  Weight:      Height:       Neurology awake and alert ENT with mild pallor Cardiovascular with S1 and S2 present and regular with  no gallops, rubs or murmurs Respiratory with no rales or wheezing, no rhonchi Abdomen with no distention  No lower extremity edema  Data Reviewed:    Family Communication: no family at the bedside   Disposition: Status is: Inpatient Remains inpatient appropriate because: IV  antibiotic therapy   Planned Discharge Destination: Home      Author: Coralie Keens, MD 05/19/2023 4:19 PM  For on call review [email or web address].

## 2023-05-19 NOTE — Assessment & Plan Note (Signed)
Continue insulin sliding scale for glucose cover and monitoring.  Decrease basal insulin to 10 units to avoid hypoglycemia.  Fasting glucose this am 83 mg/dl.   Continue with statin therapy.

## 2023-05-20 DIAGNOSIS — E66811 Obesity, class 1: Secondary | ICD-10-CM

## 2023-05-20 DIAGNOSIS — I2510 Atherosclerotic heart disease of native coronary artery without angina pectoris: Secondary | ICD-10-CM | POA: Diagnosis not present

## 2023-05-20 DIAGNOSIS — N179 Acute kidney failure, unspecified: Secondary | ICD-10-CM | POA: Diagnosis not present

## 2023-05-20 DIAGNOSIS — N3000 Acute cystitis without hematuria: Secondary | ICD-10-CM | POA: Diagnosis not present

## 2023-05-20 DIAGNOSIS — I739 Peripheral vascular disease, unspecified: Secondary | ICD-10-CM | POA: Diagnosis not present

## 2023-05-20 LAB — BASIC METABOLIC PANEL
Anion gap: 8 (ref 5–15)
BUN: 16 mg/dL (ref 8–23)
CO2: 17 mmol/L — ABNORMAL LOW (ref 22–32)
Calcium: 7.9 mg/dL — ABNORMAL LOW (ref 8.9–10.3)
Chloride: 111 mmol/L (ref 98–111)
Creatinine, Ser: 1.51 mg/dL — ABNORMAL HIGH (ref 0.44–1.00)
GFR, Estimated: 36 mL/min — ABNORMAL LOW (ref >60)
Glucose, Bld: 83 mg/dL (ref 70–99)
Potassium: 4.1 mmol/L (ref 3.5–5.1)
Sodium: 136 mmol/L (ref 135–145)

## 2023-05-20 LAB — CBC WITH DIFFERENTIAL/PLATELET
Abs Immature Granulocytes: 0.01 10*3/uL (ref 0.00–0.07)
Basophils Absolute: 0 10*3/uL (ref 0.0–0.1)
Basophils Relative: 0 %
Eosinophils Absolute: 0.1 10*3/uL (ref 0.0–0.5)
Eosinophils Relative: 2 %
HCT: 28.5 % — ABNORMAL LOW (ref 36.0–46.0)
Hemoglobin: 9.5 g/dL — ABNORMAL LOW (ref 12.0–15.0)
Immature Granulocytes: 0 %
Lymphocytes Relative: 30 %
Lymphs Abs: 1.5 10*3/uL (ref 0.7–4.0)
MCH: 29.4 pg (ref 26.0–34.0)
MCHC: 33.3 g/dL (ref 30.0–36.0)
MCV: 88.2 fL (ref 80.0–100.0)
Monocytes Absolute: 0.6 10*3/uL (ref 0.1–1.0)
Monocytes Relative: 12 %
Neutro Abs: 2.9 10*3/uL (ref 1.7–7.7)
Neutrophils Relative %: 56 %
Platelets: 131 10*3/uL — ABNORMAL LOW (ref 150–400)
RBC: 3.23 MIL/uL — ABNORMAL LOW (ref 3.87–5.11)
RDW: 14.6 % (ref 11.5–15.5)
WBC: 5.2 10*3/uL (ref 4.0–10.5)
nRBC: 0 % (ref 0.0–0.2)

## 2023-05-20 LAB — GLUCOSE, CAPILLARY
Glucose-Capillary: 106 mg/dL — ABNORMAL HIGH (ref 70–99)
Glucose-Capillary: 92 mg/dL (ref 70–99)
Glucose-Capillary: 148 mg/dL — ABNORMAL HIGH (ref 70–99)
Glucose-Capillary: 154 mg/dL — ABNORMAL HIGH (ref 70–99)

## 2023-05-20 NOTE — Plan of Care (Signed)
  Problem: Activity: Goal: Ability to tolerate increased activity will improve Outcome: Progressing   Problem: Cardiac: Goal: Ability to achieve and maintain adequate cardiovascular perfusion will improve Outcome: Progressing   Problem: Clinical Measurements: Goal: Signs and symptoms of infection will decrease Outcome: Progressing   Problem: Respiratory: Goal: Ability to maintain adequate ventilation will improve Outcome: Progressing

## 2023-05-20 NOTE — Progress Notes (Addendum)
Progress Note   [name] UJW:[id number] DOB: 1951-04-04 DOA: 05/17/2023     2 DOS: the patient was seen and examined on 05/20/2023   Brief hospital course: Mrs. Cynthia Sims cystitis.    72 y.o. female with medical history significant for DM2, CKD 3b, chronic urinary retention who self caths, recurrent UTI, CAD, HTN, PVD, OSA on CPAP, who presented with acute onset chest pain as well as bilateral flank pain.  In the prior 2 weeks she had been having intermittent abdominal pain,  vomiting and diarrhea.   On arrival to ED she was hypotensive at 86/55, fluid responsive to 111/46. Labs with baseline CBC, creatinine at 2.22, baseline of 1.5, bicarb 18, lipase 74, troponin 45.   UA with large leukocytes and many bacteria. EKG with NSR, no acute ST-T changes. Chest x-ray without any acute abnormality. CT abdomen and pelvis with contrast showing cystitis among other findings as outlined below: IMPRESSION: 1. Mild inflammatory stranding surrounding the bladder worrisome for cystitis. 2. New pneumobilia in the left lobe of the liver. Please correlate with surgical history of prior sphincterotomy. 3. Sigmoid colon diverticulosis. 4. Aortic atherosclerosis.   Patient treated with NS boluses, morphine, Zofran and started on IV Zosyn.  12/6: Vital stable, pending culture.  Most recent urine culture with Citrobacter species, shows only resistance to Bactrim.  Patient doing in and out catheter for many year and follow-up with urology. Might need prolonged suppressive course of antibiotics if cultures are positive.  12/07 change antibiotic therapy to IV ceftriaxone.  12/08 clinically improving, possible discharge in the next 24 to 48 hrs.   Assessment and Plan: * Acute cystitis Chronic urinary retention requiring catheterization.  Recurrent UTIs Urine culture positive for citrobacter, >100,000 CFU sensitive to cephalosporins.  Wbc is 5.2 and patient has been afebrile.    Continue antibiotic therapy to ceftriaxone Continue in and out bladder catheterization Will need outpatient follow up with Urology.  Request PT and OT in preparation for possible discharge home in 24 to 48 hrs with oral antibiotic therapy.   Coronary artery disease Troponin elevation due to infection, she ruled out for acute coronary syndrome.  Continue lovastatin, aspirin, clopidogrel and carvedilol.   Acute renal failure superimposed on stage 3b chronic kidney disease (HCC) Hyponatremia.  Stable renal function with serum cr at 1,51 with K at 4,1 and serum bicarbonate at 17 Na 136   Continue close monitoring renal function and electrolytes .   Essential hypertension Continue blood pressure control with carvedilol and isosorbide.  Systolic blood pressure has been 120's   PAD (peripheral artery disease) (HCC) Continue blood pressure control and statin therapy. Patient on aspirin and clopidogrel.   Type 2 diabetes mellitus with hyperlipidemia (HCC) Continue insulin sliding scale for glucose cover and monitoring.  Decrease basal insulin to 10 units to avoid hypoglycemia.  Fasting glucose this am 83 mg/dl.   Continue with statin therapy.   Obesity, class 1 Calculated BMI is 32.0   Subjective: Patient with improvement in lower abdominal pain, no nausea or vomiting, she has not been out of the bed/   Physical Exam: Vitals:   05/19/23 2014 05/19/23 2115 05/20/23 0424 05/20/23 0745  BP: (!) 130/55 (!) 127/47 (!) 123/58 (!) 146/62  Pulse: 69 (!) 58 64 62  Resp: 16  12 18   Temp: 98 F (36.7 C)  97.8 F (36.6 C) 98.1 F (36.7 C)  TempSrc: Oral  Oral Oral  SpO2: 100% 100% 100% 99%  Weight:  Height:       Neurology awake and alert ENT with mild pallor Cardiovascular with S1 and S2 present and regular with no rubs or murmurs Respiratory with no rales or rhonchi, no wheezing Abdomen with no distention  No lower extremity edema  Data Reviewed:    Family  Communication: no family at the bedside   Disposition: Status is: Inpatient Remains inpatient appropriate because: IV antibiotic therapy, possible discharge home in the next 24 to 48 hrs.   Planned Discharge Destination: Home      Author: Coralie Keens, MD 05/20/2023 3:23 PM  For on call review [email or web address].

## 2023-05-20 NOTE — TOC CM/SW Note (Signed)
Transition of Care [facility]) - Inpatient Brief Assessment   Patient Details  Name: Cynthia Sims MRN: [id number] Date of Birth: [birth year]  Transition of [facility]) CM/SW Contact:    Liliana Cline, LCSW Phone Number: 05/20/2023, 9:00 AM   Transition of Care Asessment: Insurance and Status: Insurance coverage has been reviewed Patient has primary care physician: Yes     Prior/Current Home Services: No current home services Social Determinants of Health Reivew: SDOH reviewed no interventions necessary Readmission risk has been reviewed: Yes Transition of care needs: no transition of care needs at this time

## 2023-05-20 NOTE — Assessment & Plan Note (Signed)
Calculated BMI is 32.0

## 2023-05-20 NOTE — Plan of Care (Signed)
  Problem: Education: Goal: Understanding of cardiac disease, CV risk reduction, and recovery process will improve Outcome: Progressing Goal: Individualized Educational Video(s) Outcome: Progressing   Problem: Activity: Goal: Ability to tolerate increased activity will improve Outcome: Progressing   Problem: Health Behavior/Discharge Planning: Goal: Ability to safely manage health-related needs after discharge will improve Outcome: Progressing

## 2023-05-21 DIAGNOSIS — N3000 Acute cystitis without hematuria: Secondary | ICD-10-CM | POA: Diagnosis not present

## 2023-05-21 LAB — BASIC METABOLIC PANEL
Anion gap: 4 — ABNORMAL LOW (ref 5–15)
BUN: 15 mg/dL (ref 8–23)
CO2: 21 mmol/L — ABNORMAL LOW (ref 22–32)
Calcium: 7.9 mg/dL — ABNORMAL LOW (ref 8.9–10.3)
Chloride: 112 mmol/L — ABNORMAL HIGH (ref 98–111)
Creatinine, Ser: 1.35 mg/dL — ABNORMAL HIGH (ref 0.44–1.00)
GFR, Estimated: 41 mL/min — ABNORMAL LOW (ref >60)
Glucose, Bld: 120 mg/dL — ABNORMAL HIGH (ref 70–99)
Potassium: 4.1 mmol/L (ref 3.5–5.1)
Sodium: 137 mmol/L (ref 135–145)

## 2023-05-21 LAB — CBC
HCT: 28 % — ABNORMAL LOW (ref 36.0–46.0)
Hemoglobin: 9.4 g/dL — ABNORMAL LOW (ref 12.0–15.0)
MCH: 29.1 pg (ref 26.0–34.0)
MCHC: 33.6 g/dL (ref 30.0–36.0)
MCV: 86.7 fL (ref 80.0–100.0)
Platelets: 132 10*3/uL — ABNORMAL LOW (ref 150–400)
RBC: 3.23 MIL/uL — ABNORMAL LOW (ref 3.87–5.11)
RDW: 14.6 % (ref 11.5–15.5)
WBC: 4.1 10*3/uL (ref 4.0–10.5)
nRBC: 0 % (ref 0.0–0.2)

## 2023-05-21 MED ORDER — CEFADROXIL 500 MG PO CAPS: 500.0000 mg | ORAL_CAPSULE | Freq: Two times a day (BID) | ORAL | 0 refills | Status: DC

## 2023-05-21 MED ORDER — CIPROFLOXACIN HCL 500 MG PO TABS: 500.0000 mg | ORAL_TABLET | Freq: Two times a day (BID) | ORAL | 0 refills | Status: AC

## 2023-05-21 NOTE — Discharge Summary (Signed)
Physician Discharge Summary  [name] VWU:[id number] DOB: [birth year] DOA: 05/17/2023  PCP: [name], MD  Admit date: 05/17/2023 Discharge date: 05/21/2023 Recommendations for Outpatient Follow-up:  Follow up with PCP in 1 weeks-call for appointment Follow-up with urology to discuss further need for prophylactic antibiotics for recurrent UTI as she continues to need In-N-Out catheterization Please obtain BMP/CBC in one week  Discharge Dispo: Home Discharge Condition: Stable Code Status:   Code Status: Full Code Diet recommendation:  Diet Order             Diet heart healthy/carb modified Room service appropriate? Yes; Fluid consistency: Thin  Diet effective now                    Brief/Interim Summary: Cynthia Sims cystitis.   72 y.o. female with medical history significant for DM2, CKD 3b, chronic urinary retention who self caths, recurrent UTI, CAD, HTN, PVD, OSA on CPAP, who presented with acute onset chest pain as well as bilateral flank pain.  In the prior 2 weeks she had been having intermittent abdominal pain,  vomiting and diarrhea.   On arrival to ED she was hypotensive at 86/55, fluid responsive to 111/46. Labs with baseline CBC, creatinine at 2.22, baseline of 1.5, bicarb 18, lipase 74, troponin 45.   UA with large leukocytes and many bacteria. EKG with NSR, no acute ST-T changes. Chest x-ray without any acute abnormality. CT abdomen and pelvis with contrast>> ystitis among other findings as outlined below: IMPRESSION: 1. Mild inflammatory stranding surrounding the bladder worrisome for cystitis. 2. New pneumobilia in the left lobe of the liver. Please correlate with surgical history of prior sphincterotomy. 3. Sigmoid colon diverticulosis. 4. Aortic atherosclerosis.   Patient treated with NS boluses, morphine, Zofran and started on IV Zosyn.Most recent urine culture with Citrobacter species, shows only resistance to Bactrim. Patient doing  in and out catheter for many year and follow-up with urology. Might need prolonged suppressive course of antibiotics if cultures are positive. Blood culture negative.  Unfortunately no urine culture from admission, but has been clinically improved and stable 12/09> labs shows improved creatinine 1.1 CBC with chronic anemia and thrombocytopenia, patient has been doing well on cephalosporin She feels well and feels ready for discharge home with husband and granddaughters   Discharge Diagnoses:  Principal Problem:   Acute cystitis Active Problems:   Coronary artery disease   Acute renal failure superimposed on stage 3b chronic kidney disease (HCC)   Essential hypertension   PAD (peripheral artery disease) (HCC)   Type 2 diabetes mellitus with hyperlipidemia (HCC)   Obesity, class 1  Acute cystitis Chronic urinary retention requiring catheterization.  Recurrent UTIs Urine culture positive for citrobacter from last one. No urine culture now. Improved on rocephin, will cover with cipro x 3 more days for her last bacteria. Continue in and out bladder catheterization and fu urology>Will need outpatient follow up with Urology.    Coronary artery disease Troponin elevation due to infection, she ruled out for acute coronary syndrome.  Continue lovastatin, aspirin, clopidogrel and carvedilol.   Acute renal failure superimposed on stage 3b chronic kidney disease (HCC) Hyponatremia. Resolved;  Essential hypertension Continue home meds with Coreg Imdur   PAD Continue blood pressure control and statin therapy. Patient on aspirin and clopidogrel.   Type 2 diabetes mellitus with hyperlipidemia (HCC) Cont home regimen  Obesity, class 1 Calculated BMI is 32.0  Consults: none Subjective: AAOX3, feels ready for home  Discharge Exam:  Vitals:   05/21/23 0146 05/21/23 0924  BP: (!) 116/51 112/69  Pulse: (!) 59 64  Resp: 16 16  Temp: (!) 97.5 F (36.4 C) 97.9 F (36.6 C)  SpO2: 98%  99%   General: Pt is alert, awake, not in acute distress Cardiovascular: RRR, S1/S2 +, no rubs, no gallops Respiratory: CTA bilaterally, no wheezing, no rhonchi Abdominal: Soft, NT, ND, bowel sounds + Extremities: no edema, no cyanosis  Discharge Instructions  Discharge Instructions     Discharge instructions   Complete by: As directed    Follow with urology to discuss about antibiotics  Please call call MD or return to ER for similar or worsening recurring problem that brought you to hospital or if any fever,nausea/vomiting,abdominal pain, uncontrolled pain, chest pain,  shortness of breath or any other alarming symptoms.  Please follow-up your doctor as instructed in a week time and call the office for appointment.  Please avoid alcohol, smoking, or any other illicit substance and maintain healthy habits including taking your regular medications as prescribed.  You were cared for by a hospitalist during your hospital stay. If you have any questions about your discharge medications or the care you received while you were in the hospital after you are discharged, you can call the unit and ask to speak with the hospitalist on call if the hospitalist that took care of you is not available.  Once you are discharged, your primary care physician will handle any further medical issues. Please note that NO REFILLS for any discharge medications will be authorized once you are discharged, as it is imperative that you return to your primary care physician (or establish a relationship with a primary care physician if you do not have one) for your aftercare needs so that they can reassess your need for medications and monitor your lab values   Increase activity slowly   Complete by: As directed       Allergies as of 05/21/2023   No Known Allergies      Medication List     TAKE these medications    acetaminophen 500 MG tablet Commonly known as: TYLENOL Take 500 mg by mouth every 6 (six)  hours as needed.   aspirin EC 81 MG tablet Take 81 mg by mouth daily.   Basaglar KwikPen 100 UNIT/ML INJECT 24 UNITS SUBCUTANEOUSLY ONCE DAILY   carvedilol 25 MG tablet Commonly known as: COREG Take 1 tablet by mouth twice daily   cholestyramine 4 g packet Commonly known as: QUESTRAN DISSOLVE AND TAKE ONE PACKET BY MOUTH THREE TIMES A DAY   ciprofloxacin 500 MG tablet Commonly known as: Cipro Take 1 tablet (500 mg total) by mouth 2 (two) times daily for 3 days.   clopidogrel 75 MG tablet Commonly known as: PLAVIX Take 1 tablet by mouth once daily   cyanocobalamin 1000 MCG tablet Take 1,000 mcg by mouth daily.   DULoxetine 60 MG capsule Commonly known as: CYMBALTA Take 1 capsule by mouth once daily   gabapentin 100 MG capsule Commonly known as: NEURONTIN TAKE 1 CAPSULE BY MOUTH THREE TIMES DAILY   glipiZIDE 10 MG tablet Commonly known as: GLUCOTROL Take 10 mg by mouth 2 (two) times daily.   Gvoke HypoPen 2-Pack 1 MG/0.2ML Soaj Generic drug: Glucagon Inject 1 mg as directed as directed.  INJECT 1 PEN IN CASE OF SEVERE HYPOGLYCEMIA   irbesartan 75 MG tablet Commonly known as: AVAPRO Take 1 tablet (75 mg total) by mouth daily. What changed: Another medication  with the same name was removed. Continue taking this medication, and follow the directions you see here.   isosorbide mononitrate 30 MG 24 hr tablet Commonly known as: IMDUR Take 1 tablet (30 mg total) by mouth daily.   Jardiance 10 MG Tabs tablet Generic drug: empagliflozin Take 10 mg by mouth every morning.   lovastatin 40 MG tablet Commonly known as: MEVACOR Take 1 tablet by mouth once daily   metFORMIN 500 MG tablet Commonly known as: GLUCOPHAGE Take 500 mg by mouth 2 (two) times daily.   Mounjaro 5 MG/0.5ML Pen Generic drug: tirzepatide Inject 5 mg into the skin once a week. What changed: Another medication with the same name was removed. Continue taking this medication, and follow the  directions you see here.   nitroGLYCERIN 0.4 MG SL tablet Commonly known as: NITROSTAT Place 1 tablet (0.4 mg total) under the tongue every 5 (five) minutes as needed for chest pain.   ondansetron 4 MG disintegrating tablet Commonly known as: ZOFRAN-ODT Take 1 tablet (4 mg total) by mouth every 8 (eight) hours as needed for nausea or vomiting.   pantoprazole 40 MG tablet Commonly known as: Protonix Take 1 tablet (40 mg total) by mouth daily.        Follow-up Information     [name], MD. Go on 05/28/2023.   Specialty: Family Medicine Why: Go to see Primary Dr at 9 am on 05/28/2023 Contact information: [address] [place] [address] [phone]                No Known Allergies  The results of significant diagnostics from this hospitalization (including imaging, microbiology, ancillary and laboratory) are listed below for reference.    Microbiology: Recent Results (from the past 240 hour(s))  Culture, blood (x 2)     Status: None (Preliminary result)   Collection Time: 05/18/23  4:35 AM   Specimen: BLOOD  Result Value Ref Range Status   Specimen Description BLOOD RAC  Final   Special Requests   Final    BOTTLES DRAWN AEROBIC AND ANAEROBIC Blood Culture adequate volume   Culture   Final    NO GROWTH 3 DAYS Performed at [facility], [address] [place], [place] [address]    Report Status PENDING  Incomplete  Culture, blood (x 2)     Status: None (Preliminary result)   Collection Time: 05/18/23  4:35 AM   Specimen: BLOOD  Result Value Ref Range Status   Specimen Description BLOOD BLOOD LEFT ARM  Final   Special Requests   Final    BOTTLES DRAWN AEROBIC AND ANAEROBIC Blood Culture adequate volume   Culture   Final    NO GROWTH 3 DAYS Performed at [facility], [address], [place], [place] [address]    Report Status PENDING  Incomplete    Procedures/Studies: ECHOCARDIOGRAM  COMPLETE  Result Date: 05/19/2023    ECHOCARDIOGRAM REPORT   Patient Name:   Cynthia Sims Date of Exam: 05/18/2023 Medical Rec #:  [id number]       Height:       61.0 in Accession #:    [id number]      Weight:       169.8 lb Date of Birth:  08-13-1950       BSA:          1.762 m Patient Age:    72 years        BP:  114/49 mmHg Patient Gender: F               HR:           68 bpm. Exam Location:  [facility] Procedure: 2D Echo, Cardiac Doppler and Color Doppler Indications:     Chest Pain  History:         Patient has prior history of Echocardiogram examinations, most                  recent 08/09/2022. CAD, PAD, Signs/Symptoms:Chest Pain and                  Dyspnea; Risk Factors:Hypertension, Diabetes, Dyslipidemia and                  Sleep Apnea. CKD.  Sonographer:     Mikki Harbor Referring Phys:  [id number] Andris Baumann Diagnosing Phys: Clotilde Dieter  Sonographer Comments: Image acquisition challenging due to respiratory motion. IMPRESSIONS  1. Left ventricular ejection fraction, by estimation, is 60 to 65%. The left ventricle has normal function. The left ventricle has no regional wall motion abnormalities. Left ventricular diastolic parameters were normal.  2. Right ventricular systolic function is normal. The right ventricular size is normal. There is normal pulmonary artery systolic pressure.  3. The mitral valve is normal in structure. Mild mitral valve regurgitation. No evidence of mitral stenosis.  4. The aortic valve is normal in structure. Aortic valve regurgitation is not visualized. No aortic stenosis is present. FINDINGS  Left Ventricle: Left ventricular ejection fraction, by estimation, is 60 to 65%. The left ventricle has normal function. The left ventricle has no regional wall motion abnormalities. The left ventricular internal cavity size was normal in size. There is  no left ventricular hypertrophy. Left ventricular diastolic parameters were normal. Right Ventricle: The right  ventricular size is normal. No increase in right ventricular wall thickness. Right ventricular systolic function is normal. There is normal pulmonary artery systolic pressure. The tricuspid regurgitant velocity is 2.76 m/s, and  with an assumed right atrial pressure of 3 mmHg, the estimated right ventricular systolic pressure is 33.5 mmHg. Left Atrium: Left atrial size was normal in size. Right Atrium: Right atrial size was normal in size. Pericardium: There is no evidence of pericardial effusion. Mitral Valve: The mitral valve is normal in structure. Mild mitral valve regurgitation. No evidence of mitral valve stenosis. MV peak gradient, 6.8 mmHg. The mean mitral valve gradient is 2.0 mmHg. Tricuspid Valve: The tricuspid valve is normal in structure. Tricuspid valve regurgitation is mild . No evidence of tricuspid stenosis. Aortic Valve: The aortic valve is normal in structure. Aortic valve regurgitation is not visualized. No aortic stenosis is present. Aortic valve mean gradient measures 4.0 mmHg. Aortic valve peak gradient measures 7.0 mmHg. Aortic valve area, by VTI measures 2.60 cm. Pulmonic Valve: The pulmonic valve was normal in structure. Pulmonic valve regurgitation is trivial. No evidence of pulmonic stenosis. Aorta: The aortic root is normal in size and structure. IAS/Shunts: No atrial level shunt detected by color flow Doppler.  LEFT VENTRICLE PLAX 2D LVIDd:         4.20 cm   Diastology LVIDs:         1.70 cm   LV e' medial:    17.10 cm/s LV PW:         1.10 cm   LV E/e' medial:  5.6 LV IVS:        1.30 cm   LV e' lateral:  10.80 cm/s LVOT diam:     1.90 cm   LV E/e' lateral: 8.9 LV SV:         90 LV SV Index:   51 LVOT Area:     2.84 cm  RIGHT VENTRICLE RV Basal diam:  4.80 cm RV Mid diam:    3.50 cm RV S prime:     14.70 cm/s TAPSE (M-mode): 2.5 cm LEFT ATRIUM             Index        RIGHT ATRIUM           Index LA diam:        3.80 cm 2.16 cm/m   RA Area:     18.80 cm LA Vol (A2C):   41.9 ml  23.78 ml/m  RA Volume:   55.40 ml  31.45 ml/m LA Vol (A4C):   47.2 ml 26.79 ml/m LA Biplane Vol: 44.5 ml 25.26 ml/m  AORTIC VALVE                    PULMONIC VALVE AV Area (Vmax):    2.34 cm     PV Vmax:       1.00 m/s AV Area (Vmean):   2.73 cm     PV Peak grad:  4.0 mmHg AV Area (VTI):     2.60 cm AV Vmax:           132.00 cm/s AV Vmean:          89.600 cm/s AV VTI:            0.348 m AV Peak Grad:      7.0 mmHg AV Mean Grad:      4.0 mmHg LVOT Vmax:         109.00 cm/s LVOT Vmean:        86.400 cm/s LVOT VTI:          0.319 m LVOT/AV VTI ratio: 0.92  AORTA Ao Root diam: 3.30 cm Ao Asc diam:  3.30 cm MITRAL VALVE               TRICUSPID VALVE MV Area (PHT): 2.63 cm    TR Peak grad:   30.5 mmHg MV Area VTI:   2.42 cm    TR Vmax:        276.00 cm/s MV Peak grad:  6.8 mmHg MV Mean grad:  2.0 mmHg    SHUNTS MV Vmax:       1.30 m/s    Systemic VTI:  0.32 m MV Vmean:      56.7 cm/s   Systemic Diam: 1.90 cm MV Decel Time: 288 msec MV E velocity: 95.90 cm/s MV A velocity: 69.70 cm/s MV E/A ratio:  1.38 Designer, multimedia signed by Clotilde Dieter Signature Date/Time: 05/19/2023/11:31:13 AM    Final    CT ABDOMEN PELVIS WO CONTRAST  Result Date: 05/18/2023 CLINICAL DATA:  Left lower quadrant pain EXAM: CT ABDOMEN AND PELVIS WITHOUT CONTRAST TECHNIQUE: Multidetector CT imaging of the abdomen and pelvis was performed following the standard protocol without IV contrast. RADIATION DOSE REDUCTION: This exam was performed according to the departmental dose-optimization program which includes automated exposure control, adjustment of the mA and/or kV according to patient size and/or use of iterative reconstruction technique. COMPARISON:  CT abdomen and pelvis 01/17/2023 FINDINGS: Lower chest: No acute abnormality. Hepatobiliary: Patient is status post cholecystectomy. There is pneumobilia in the left lobe of the liver which is new  from prior. No focal liver lesions are seen. No biliary ductal dilatation.  Pancreas: Unremarkable. No pancreatic ductal dilatation or surrounding inflammatory changes. Spleen: Normal in size without focal abnormality. Adrenals/Urinary Tract: Bladder is decompressed. There is mild inflammatory stranding surrounding the bladder. There is no hydronephrosis or focal renal lesion. The adrenal glands are within normal limits. Stomach/Bowel: Stomach is within normal limits. Appendix appears normal. No evidence of bowel wall thickening, distention, or inflammatory changes. There is sigmoid colon diverticulosis. Vascular/Lymphatic: Aortic atherosclerosis. No enlarged abdominal or pelvic lymph nodes. Reproductive: Uterus and bilateral adnexa are unremarkable. Other: No abdominal wall hernia or abnormality. No abdominopelvic ascites. Musculoskeletal: No acute or significant osseous findings. IMPRESSION: 1. Mild inflammatory stranding surrounding the bladder worrisome for cystitis. 2. New pneumobilia in the left lobe of the liver. Please correlate with surgical history of prior sphincterotomy. 3. Sigmoid colon diverticulosis. 4. Aortic atherosclerosis. Aortic Atherosclerosis (ICD10-I70.0). Electronically Signed   By: Darliss Cheney M.D.   On: 05/18/2023 00:54   DG Chest 2 View  Result Date: 05/17/2023 CLINICAL DATA:  chest pain EXAM: CHEST - 2 VIEW COMPARISON:  06/09/2022. FINDINGS: Cardiac silhouette is unremarkable. No pneumothorax or pleural effusion. The lungs are clear. Aorta is calcified. The visualized skeletal structures are unremarkable. IMPRESSION: No acute cardiopulmonary process. Electronically Signed   By: Layla Maw M.D.   On: 05/17/2023 22:23    Labs: BNP (last 3 results) Recent Labs    06/09/22 1532  BNP 21.9   Basic Metabolic Panel: Recent Labs  Lab 05/17/23 2152 05/19/23 1622 05/20/23 0439 05/21/23 0451  NA 133* 135 136 137  K 4.3 4.2 4.1 4.1  CL 104 111 111 112*  CO2 18* 17* 17* 21*  GLUCOSE 157* 139* 83 120*  BUN 40* 20 16 15   CREATININE 2.22* 1.59*  1.51* 1.35*  CALCIUM 8.6* 7.7* 7.9* 7.9*   Liver Function Tests: Recent Labs  Lab 05/18/23 0433  AST 18  ALT 12  ALKPHOS 54  BILITOT 0.5  PROT 5.3*  ALBUMIN 2.5*   Recent Labs  Lab 05/17/23 2152  LIPASE 74*   No results for input(s): "AMMONIA" in the last 168 hours. CBC: Recent Labs  Lab 05/17/23 2152 05/20/23 0439 05/21/23 0451  WBC 8.0 5.2 4.1  NEUTROABS  --  2.9  --   HGB 10.9* 9.5* 9.4*  HCT 33.8* 28.5* 28.0*  MCV 90.9 88.2 86.7  PLT 132* 131* 132*   Cardiac Enzymes: No results for input(s): "CKTOTAL", "CKMB", "CKMBINDEX", "TROPONINI" in the last 168 hours. BNP: Invalid input(s): "POCBNP" CBG: Recent Labs  Lab 05/19/23 2112 05/20/23 0906 05/20/23 1117 05/20/23 1631 05/20/23 2145  GLUCAP 90 106* 92 148* 154*   D-Dimer No results for input(s): "DDIMER" in the last 72 hours. Hgb A1c No results for input(s): "HGBA1C" in the last 72 hours. Lipid Profile No results for input(s): "CHOL", "HDL", "LDLCALC", "TRIG", "CHOLHDL", "LDLDIRECT" in the last 72 hours. Thyroid function studies No results for input(s): "TSH", "T4TOTAL", "T3FREE", "THYROIDAB" in the last 72 hours.  Invalid input(s): "FREET3" Anemia work up No results for input(s): "VITAMINB12", "FOLATE", "FERRITIN", "TIBC", "IRON", "RETICCTPCT" in the last 72 hours. Urinalysis    Component Value Date/Time   COLORURINE YELLOW (A) 05/18/2023 0011   APPEARANCEUR CLOUDY (A) 05/18/2023 0011   APPEARANCEUR Turbid (A) 04/27/2023 0000   LABSPEC 1.020 05/18/2023 0011   PHURINE 5.0 05/18/2023 0011   GLUCOSEU >=500 (A) 05/18/2023 0011   HGBUR SMALL (A) 05/18/2023 0011   BILIRUBINUR NEGATIVE 05/18/2023 0011  BILIRUBINUR Negative 04/27/2023 0000   KETONESUR NEGATIVE 05/18/2023 0011   PROTEINUR NEGATIVE 05/18/2023 0011   UROBILINOGEN 0.2 02/24/2019 1123   NITRITE NEGATIVE 05/18/2023 0011   LEUKOCYTESUR LARGE (A) 05/18/2023 0011   Sepsis Labs Recent Labs  Lab 05/17/23 2152 05/20/23 0439  05/21/23 0451  WBC 8.0 5.2 4.1   Microbiology Recent Results (from the past 240 hour(s))  Culture, blood (x 2)     Status: None (Preliminary result)   Collection Time: 05/18/23  4:35 AM   Specimen: BLOOD  Result Value Ref Range Status   Specimen Description BLOOD RAC  Final   Special Requests   Final    BOTTLES DRAWN AEROBIC AND ANAEROBIC Blood Culture adequate volume   Culture   Final    NO GROWTH 3 DAYS Performed at [facility], [address], [place], [place] [address]    Report Status PENDING  Incomplete  Culture, blood (x 2)     Status: None (Preliminary result)   Collection Time: 05/18/23  4:35 AM   Specimen: BLOOD  Result Value Ref Range Status   Specimen Description BLOOD BLOOD LEFT ARM  Final   Special Requests   Final    BOTTLES DRAWN AEROBIC AND ANAEROBIC Blood Culture adequate volume   Culture   Final    NO GROWTH 3 DAYS Performed at [facility], [address] [place], [place] [address]    Report Status PENDING  Incomplete     Time coordinating discharge: 25 minutes  SIGNED: Lanae Boast, MD  [facility] 05/21/2023, 11:56 AM  If 7PM-7AM, please contact night-coverage [email or web address]

## 2023-05-21 NOTE — Progress Notes (Signed)
Dani Gobble to be D/C'd Home per MD order.  Discussed prescriptions and follow up appointments with the patient. Prescriptions given to patient, medication list explained in detail. Pt verbalized understanding.  Allergies as of 05/21/2023   No Known Allergies      Medication List     TAKE these medications    acetaminophen 500 MG tablet Commonly known as: TYLENOL Take 500 mg by mouth every 6 (six) hours as needed.   aspirin EC 81 MG tablet Take 81 mg by mouth daily.   Basaglar KwikPen 100 UNIT/ML INJECT 24 UNITS SUBCUTANEOUSLY ONCE DAILY   carvedilol 25 MG tablet Commonly known as: COREG Take 1 tablet by mouth twice daily   cholestyramine 4 g packet Commonly known as: QUESTRAN DISSOLVE AND TAKE ONE PACKET BY MOUTH THREE TIMES A DAY   ciprofloxacin 500 MG tablet Commonly known as: Cipro Take 1 tablet (500 mg total) by mouth 2 (two) times daily for 3 days.   clopidogrel 75 MG tablet Commonly known as: PLAVIX Take 1 tablet by mouth once daily   cyanocobalamin 1000 MCG tablet Take 1,000 mcg by mouth daily.   DULoxetine 60 MG capsule Commonly known as: CYMBALTA Take 1 capsule by mouth once daily   gabapentin 100 MG capsule Commonly known as: NEURONTIN TAKE 1 CAPSULE BY MOUTH THREE TIMES DAILY   glipiZIDE 10 MG tablet Commonly known as: GLUCOTROL Take 10 mg by mouth 2 (two) times daily.   Gvoke HypoPen 2-Pack 1 MG/0.2ML Soaj Generic drug: Glucagon Inject 1 mg as directed as directed.  INJECT 1 PEN IN CASE OF SEVERE HYPOGLYCEMIA   irbesartan 75 MG tablet Commonly known as: AVAPRO Take 1 tablet (75 mg total) by mouth daily. What changed: Another medication with the same name was removed. Continue taking this medication, and follow the directions you see here.   isosorbide mononitrate 30 MG 24 hr tablet Commonly known as: IMDUR Take 1 tablet (30 mg total) by mouth daily.   Jardiance 10 MG Tabs tablet Generic drug: empagliflozin Take 10 mg by mouth every  morning.   lovastatin 40 MG tablet Commonly known as: MEVACOR Take 1 tablet by mouth once daily   metFORMIN 500 MG tablet Commonly known as: GLUCOPHAGE Take 500 mg by mouth 2 (two) times daily.   Mounjaro 5 MG/0.5ML Pen Generic drug: tirzepatide Inject 5 mg into the skin once a week. What changed: Another medication with the same name was removed. Continue taking this medication, and follow the directions you see here.   nitroGLYCERIN 0.4 MG SL tablet Commonly known as: NITROSTAT Place 1 tablet (0.4 mg total) under the tongue every 5 (five) minutes as needed for chest pain.   ondansetron 4 MG disintegrating tablet Commonly known as: ZOFRAN-ODT Take 1 tablet (4 mg total) by mouth every 8 (eight) hours as needed for nausea or vomiting.   pantoprazole 40 MG tablet Commonly known as: Protonix Take 1 tablet (40 mg total) by mouth daily.        Vitals:   05/21/23 0924 05/21/23 1340  BP: 112/69 (!) 126/53  Pulse: 64   Resp: 16   Temp: 97.9 F (36.6 C)   SpO2: 99%     Skin clean, dry and intact without evidence of skin break down, no evidence of skin tears noted. IV catheter discontinued intact. Site without signs and symptoms of complications. Dressing and pressure applied. Pt denies pain at this time. No complaints noted.  An After Visit Summary was printed and given to  the patient. Patient escorted via WC, and D/C home via private auto.  Chaia Ikard C. Jilda Roche

## 2023-05-21 NOTE — Care Management Important Message (Signed)
Important Message  Patient Details  Name: Cynthia Sims MRN: [id number] Date of Birth: Jun 27, 1950   Important Message Given:  N/A - LOS <3 / Initial given by admissions     Olegario Messier A Shawnie Nicole 05/21/2023, 12:58 PM

## 2023-05-22 ENCOUNTER — Telehealth: Payer: Self-pay

## 2023-05-22 NOTE — Transitions of Care (Post Inpatient/ED Visit) (Signed)
05/22/2023  Name: Cynthia Sims MRN: [id number] DOB: 1950-08-11  Today's TOC FU Call Status: Today's TOC FU Call Status:: Successful TOC FU Call Completed TOC FU Call Complete Date: 05/22/23 Patient's Name and Date of Birth confirmed.  Transition Care Management Follow-up Telephone Call Date of Discharge: 05/21/23 Discharge Facility: [facility]) Type of Discharge: Inpatient Admission Primary Inpatient Discharge Diagnosis:: Cystitis How have you been since you were released from the hospital?: Better Any questions or concerns?: No Patient Questions/Concerns:: None  Items Reviewed: Did you receive and understand the discharge instructions provided?: Yes Medications obtained,verified, and reconciled?: Yes (Medications Reviewed) Any new allergies since your discharge?: No Dietary orders reviewed?: Yes Type of Diet Ordered:: Heart Healthy. Diabetic Do you have support at home?: Yes People in Home: grandchild(ren), spouse Name of Support/Comfort Primary Source: [name] is the spouse. Neysa Bonito is the daughter  Medications Reviewed Today: Medications Reviewed Today     Reviewed by Redge Gainer, RN (Case Manager) on 05/22/23 at 1122  Med List Status: <None>   Medication Order Taking? Sig Documenting Provider Last Dose Status Informant  acetaminophen (TYLENOL) 500 MG tablet [id number] No Take 500 mg by mouth every 6 (six) hours as needed.  [provider] prn unk Active Self           Med Note Nelia Shi   Fri May 18, 2023  7:13 AM)    aspirin EC 81 MG tablet [id number] No Take 81 mg by mouth daily.  [provider] 05/17/2023 Active Self           Med Note Berle Mull, Mindi Curling Aug 02, 2017  8:59 AM)    carvedilol (COREG) 25 MG tablet [id number] No Take 1 tablet by mouth twice daily Mealor, Roberts Gaudy, MD 05/17/2023 Active Self  cholestyramine (QUESTRAN) 4 g packet [id number] No DISSOLVE AND TAKE ONE PACKET BY MOUTH THREE  TIMES A DAY Debera Lat, PA-C 05/17/2023 Active Self  ciprofloxacin (CIPRO) 500 MG tablet [id number]  Take 1 tablet (500 mg total) by mouth 2 (two) times daily for 3 days. Lanae Boast, MD  Active   clopidogrel (PLAVIX) 75 MG tablet [id number] No Take 1 tablet by mouth once daily Schnier, Latina Craver, MD 05/17/2023 am Active Self  cyanocobalamin 1000 MCG tablet [id number] No Take 1,000 mcg by mouth daily. [provider] 05/17/2023 Active Self  DULoxetine (CYMBALTA) 60 MG capsule [id number] No Take 1 capsule by mouth once daily Simmons-Robinson, Makiera, MD 05/17/2023 Active Self  gabapentin (NEURONTIN) 100 MG capsule [id number] No TAKE 1 CAPSULE BY MOUTH THREE TIMES DAILY Simmons-Robinson, Makiera, MD 05/17/2023 Active Self  glipiZIDE (GLUCOTROL) 10 MG tablet [id number] No Take 10 mg by mouth 2 (two) times daily. [provider] 05/17/2023 Active Self  GVOKE HYPOPEN 2-PACK 1 MG/0.2ML SOAJ [id number] No Inject 1 mg as directed as directed.  INJECT 1 PEN IN CASE OF SEVERE HYPOGLYCEMIA [provider] prn unk Active Self  Insulin Glargine [facility] KWIKPEN) 100 UNIT/ML [id number] No INJECT 24 UNITS SUBCUTANEOUSLY ONCE DAILY Simmons-Robinson, Makiera, MD 05/16/2023 Active Self  irbesartan (AVAPRO) 75 MG tablet [id number] No Take 1 tablet (75 mg total) by mouth daily. Simmons-Robinson, Tawanna Cooler, MD 05/17/2023 Active Self  isosorbide mononitrate (IMDUR) 30 MG 24 hr tablet [id number] No Take 1 tablet (30 mg total) by mouth daily. Sharlene Dory, [place] 05/17/2023 Active Self  JARDIANCE 10 MG TABS tablet [id number] No Take 10 mg by mouth every morning. [provider] 05/17/2023 Active Self  lovastatin (MEVACOR) 40 MG tablet [id number] No Take 1 tablet by mouth once daily Debera Lat, PA-C 05/17/2023 Active Self  metFORMIN (GLUCOPHAGE) 500 MG tablet [id number] No Take 500 mg by mouth 2 (two) times daily. [provider] 05/17/2023 Active Self  MOUNJARO 5 MG/0.5ML Pen [id number] No Inject 5  mg into the skin once a week. [provider] 05/13/2023 Active Self  nitroGLYCERIN (NITROSTAT) 0.4 MG SL tablet [id number] No Place 1 tablet (0.4 mg total) under the tongue every 5 (five) minutes as needed for chest pain. Christell Constant, MD 05/17/2023 pm Active Self           Med Note Aundria Rud, Landry Dyke   Fri May 18, 2023  7:13 AM)    ondansetron (ZOFRAN-ODT) 4 MG disintegrating tablet [id number] No Take 1 tablet (4 mg total) by mouth every 8 (eight) hours as needed for nausea or vomiting. Simmons-Robinson, Makiera, MD prn unk Active Self  pantoprazole (PROTONIX) 40 MG tablet [id number] No Take 1 tablet (40 mg total) by mouth daily. Schnier, Latina Craver, MD 05/17/2023 Active Self            Home Care and Equipment/Supplies: Were Home Health Services Ordered?: NA Any new equipment or medical supplies ordered?: NA  Functional Questionnaire: Do you need assistance with bathing/showering or dressing?: No Do you need assistance with meal preparation?: No Do you need assistance with eating?: No Do you have difficulty maintaining continence: No Do you need assistance with getting out of bed/getting out of a chair/moving?: No Do you have difficulty managing or taking your medications?: No  Follow up appointments reviewed: PCP Follow-up appointment confirmed?: Yes Date of PCP follow-up appointment?: 05/28/23 Follow-up Provider: [facility] Follow-up appointment confirmed?: NA Do you need transportation to your follow-up appointment?: No Do you understand care options if your condition(s) worsen?: Yes-patient verbalized understanding  SDOH Interventions Today    Flowsheet Row Most Recent Value  SDOH Interventions   Food Insecurity Interventions Intervention Not Indicated  Housing Interventions Intervention Not Indicated  Transportation Interventions Intervention Not Indicated  Utilities Interventions Intervention Not Indicated      TOC  Outreach completed today. The patient was admitted to the hospital for Cystitis and UTI due to self cath. The patient will schedule an appointment with her Urologist to discuss a low does maintenance of an antibiotic daily as prevention. She is independent with ADL's and driving. She lives with her husband who has some dementia and her 34 year old granddaughter. She is diabetic and has the [place] sensor to monitor her blood sugars. RNCM to follow up after her PCP appointment next week.  Reviewed goals for care.  Patient verbalizes understanding of instructions and care plan provided. Patient was encouraged to make informed decisions about their care, actively participate in managing their health condition, and implement lifestyle changes as needed to promote independence and self-management of healthcare  Deidre Ala, RN RN Care Manager [facility] [phone]

## 2023-05-23 LAB — CULTURE, BLOOD (ROUTINE X 2)
Culture: NO GROWTH
Culture: NO GROWTH
Special Requests: ADEQUATE
Special Requests: ADEQUATE

## 2023-05-26 ENCOUNTER — Other Ambulatory Visit: Payer: Self-pay | Admitting: Family Medicine

## 2023-05-26 DIAGNOSIS — E0842 Diabetes mellitus due to underlying condition with diabetic polyneuropathy: Secondary | ICD-10-CM

## 2023-05-27 NOTE — Progress Notes (Signed)
 Established patient visit   [name]   DOB: 12-07-1950   72 y.o. Female  MRN: [id number] Visit Date: 05/28/2023  Today's healthcare provider: [name], MD   Chief Complaint  Patient presents with   Follow-up    Dehydrated and bacteria infection with kidneys   Subjective     HPI     Follow-up    Additional comments: Dehydrated and bacteria infection with kidneys      Last edited by Clois Comber on 05/28/2023  8:50 AM.       Discussed the use of AI scribe software for clinical note transcription with the patient, who gave verbal consent to proceed.  History of Present Illness   The patient is a 72 year old individual with a complex medical history including type 2 diabetes, hyperlipidemia, peripheral arterial disease, malcognitive disorder, hypertension, retinopathy, coronary artery disease, chronic kidney disease stage 3B, and sleep apnea. She also requires chronic in-and-out catheterization. The patient presented to the ED on December 5th with bilateral flank pain and chest pain. At the time of admission, troponins were elevated at 45 and creatinine was also elevated at 2.22 from a baseline of 1.5. The patient was hypotensive upon admission. A CT of the abdomen and pelvis showed cystitis with inflammatory stranding around the bladder, pneumobilia in the left lobe of the liver, sigmoid colon diverticulosis, and aortic atherosclerosis.  The patient was treated with normal saline fluids, morphine, Zofran, and started on IV Zosyn. By December 9th, the patient's creatinine had improved to 1.1 and she was tolerating cephalosporin well. She was discharged with an additional three days of antibiotics. The patient's urine culture showed Citrobacter species that was only resistant to Bactrim.  The patient's troponin elevation was believed to be secondary to the infection in her bladder. The patient's acute renal failure, superimposed on chronic 3B  CKD, was resolved at the time of discharge.  At the time of discharge, the patient's hemoglobin was 9.4, platelets were 132, creatinine was 1.35, and GFR was 41. The patient's blood pressure was elevated at 152 over 74, an improvement from hypotension upon admission.  The patient reported feeling weak in her legs, sometimes stumbling, and has been using a cane to walk around the house. She also reported that her urine had not cleared from the antibiotic she had been given. The patient also reported feeling a kind of tightness in her chest and occasional pain. She had taken a nitroglycerin pill for this, which she was advised against by EMS as it could affect readings. The patient reported no current chest pain.  The patient also reported that she had been feeling a little nauseous and that she had been waking up three times a night to urinate, which was frustrating for her. She also reported that she had been feeling a little weak in her legs and had been using a cane to walk around the house. She also reported that she had been feeling a little nauseous and that she had been waking up three times a night to urinate, which was frustrating for her.      Follow Up appt  -Urology 06/18/23     Past Medical History:  Diagnosis Date   ALT (SGPT) level raised    Anemia    Anxiety    Arthritis    Depression    Diabetes mellitus without complication (HCC)    Elevated cholesterol    GERD (gastroesophageal reflux disease)    Hemorrhoids  Hypertension    Kidney disease     Medications: Outpatient Medications Prior to Visit  Medication Sig   acetaminophen (TYLENOL) 500 MG tablet Take 500 mg by mouth every 6 (six) hours as needed.    aspirin EC 81 MG tablet Take 81 mg by mouth daily.    carvedilol (COREG) 25 MG tablet Take 1 tablet by mouth twice daily   cholestyramine (QUESTRAN) 4 g packet DISSOLVE AND TAKE ONE PACKET BY MOUTH THREE TIMES A DAY   clopidogrel (PLAVIX) 75 MG tablet Take 1 tablet  by mouth once daily   cyanocobalamin 1000 MCG tablet Take 1,000 mcg by mouth daily.   DULoxetine (CYMBALTA) 60 MG capsule Take 1 capsule by mouth once daily   gabapentin (NEURONTIN) 100 MG capsule TAKE 1 CAPSULE BY MOUTH THREE TIMES DAILY   GVOKE HYPOPEN 2-PACK 1 MG/0.2ML SOAJ Inject 1 mg as directed as directed.  INJECT 1 PEN IN CASE OF SEVERE HYPOGLYCEMIA   Insulin Glargine (BASAGLAR KWIKPEN) 100 UNIT/ML INJECT 24 UNITS SUBCUTANEOUSLY ONCE DAILY   irbesartan (AVAPRO) 75 MG tablet Take 1 tablet (75 mg total) by mouth daily.   isosorbide mononitrate (IMDUR) 30 MG 24 hr tablet Take 1 tablet (30 mg total) by mouth daily.   JARDIANCE 10 MG TABS tablet Take 10 mg by mouth every morning.   lovastatin (MEVACOR) 40 MG tablet Take 1 tablet by mouth once daily   metFORMIN (GLUCOPHAGE) 500 MG tablet Take 500 mg by mouth 2 (two) times daily.   nitroGLYCERIN (NITROSTAT) 0.4 MG SL tablet Place 1 tablet (0.4 mg total) under the tongue every 5 (five) minutes as needed for chest pain.   ondansetron (ZOFRAN-ODT) 4 MG disintegrating tablet Take 1 tablet (4 mg total) by mouth every 8 (eight) hours as needed for nausea or vomiting.   pantoprazole (PROTONIX) 40 MG tablet Take 1 tablet (40 mg total) by mouth daily.   [DISCONTINUED] glipiZIDE (GLUCOTROL) 10 MG tablet Take 10 mg by mouth 2 (two) times daily.   [DISCONTINUED] MOUNJARO 5 MG/0.5ML Pen Inject 5 mg into the skin once a week.   No facility-administered medications prior to visit.    Review of Systems  Last CBC Lab Results  Component Value Date   WBC 4.1 05/21/2023   HGB 9.4 (L) 05/21/2023   HCT 28.0 (L) 05/21/2023   MCV 86.7 05/21/2023   MCH 29.1 05/21/2023   RDW 14.6 05/21/2023   PLT 132 (L) 05/21/2023   Last metabolic panel Lab Results  Component Value Date   GLUCOSE 120 (H) 05/21/2023   NA 137 05/21/2023   K 4.1 05/21/2023   CL 112 (H) 05/21/2023   CO2 21 (L) 05/21/2023   BUN 15 05/21/2023   CREATININE 1.35 (H) 05/21/2023   GFRNONAA  41 (L) 05/21/2023   CALCIUM 7.9 (L) 05/21/2023   PROT 5.3 (L) 05/18/2023   ALBUMIN 2.5 (L) 05/18/2023   LABGLOB 2.6 06/09/2022   AGRATIO 1.6 06/09/2022   BILITOT 0.5 05/18/2023   ALKPHOS 54 05/18/2023   AST 18 05/18/2023   ALT 12 05/18/2023   ANIONGAP 4 (L) 05/21/2023   Lab Results  Component Value Date   HGBA1C 7.4 (H) 12/25/2022         Objective    BP (!) 154/64   Pulse (!) 58   Temp 97.6 F (36.4 C)   Resp 14   Ht 5\' 1"  (1.549 m)   Wt 173 lb 14.4 oz (78.9 kg)   SpO2 100%   BMI 32.86 kg/m  BP Readings from Last 3 Encounters:  05/28/23 (!) 154/64  05/21/23 (!) 152/74  04/27/23 (!) 87/53   Wt Readings from Last 3 Encounters:  05/28/23 173 lb 14.4 oz (78.9 kg)  05/17/23 169 lb 12.1 oz (77 kg)  04/27/23 171 lb 9.6 oz (77.8 kg)        Physical Exam Vitals reviewed.  Constitutional:      General: She is not in acute distress.    Appearance: Normal appearance. She is not ill-appearing, toxic-appearing or diaphoretic.  Eyes:     Conjunctiva/sclera: Conjunctivae normal.  Cardiovascular:     Rate and Rhythm: Normal rate and regular rhythm.     Pulses: Normal pulses.     Heart sounds: Normal heart sounds. No murmur heard.    No friction rub. No gallop.  Pulmonary:     Effort: Pulmonary effort is normal. No respiratory distress.     Breath sounds: Normal breath sounds. No stridor. No wheezing, rhonchi or rales.  Abdominal:     General: Bowel sounds are normal. There is no distension.     Palpations: Abdomen is soft.     Tenderness: There is no abdominal tenderness.  Musculoskeletal:     Right lower leg: Edema present.     Left lower leg: Edema present.  Skin:    Findings: No erythema or rash.  Neurological:     Mental Status: She is alert and oriented to person, place, and time.       No results found for any visits on 05/28/23.  Assessment & Plan     Problem List Items Addressed This Visit       Cardiovascular and Mediastinum   Hypotension    Resolved on BP check today        Essential hypertension   Hypertension with fluctuating blood pressure. Improved from hypotension at admission to 152/74 at discharge. Current BP 154/64. Discussed the importance of home monitoring and risks of both high and low blood pressure. Chronic  Do not recommend additional antihypertensives due to intermittent hypotension and dizziness  Will have patient monitor BP and follow up in 6-8 weeks for reassessment without medications that may have contributed to hypotension  - Monitor blood pressure at home twice a week and record readings - Follow-up in early February 2025 - continue irbesartan 75mg , coreg 25mg  BID and imdur 30mg  daily         Endocrine   Type 2 diabetes mellitus with hyperlipidemia (HCC)   Type 2 diabetes managed with metformin, Jardiance, and insulin. Mounjaro paused due to dehydration risk. A1c was 7.4 in July. Discussed balancing blood sugar control with dehydration and hypoglycemia risks. - Stop Mounjaro - DM foot exam completed today, abnormal monofilament testing  - Continue metformin 500 mg twice a day - Continue Jardiance 10 mg daily - Continue insulin 24 units once a day - Recheck A1c at next visit - continue to follow up with endocrinology as scheduled       Relevant Orders   BMP8+EGFR   Ambulatory referral to Podiatry   Diabetic neuropathy (HCC)   Peripheral neuropathy with decreased sensation in feet. Plan to refer to podiatry for further management. Discussed the importance of foot care and monitoring for sores or injuries. Chronic - Refer to podiatry for neuropathy management      Relevant Orders   Ambulatory referral to Podiatry   Other Visit Diagnoses       Hospital discharge follow-up    -  Primary   Relevant  Orders   BMP8+EGFR   CBC     Leg weakness, bilateral       Relevant Orders   BMP8+EGFR   CBC   Ambulatory referral to Physical Therapy   CK (Creatine Kinase)           Acute Kidney  Injury (AKI) on Chronic Kidney Disease (CKD) Stage 3B AKI superimposed on CKD Stage 3B, likely secondary to dehydration and infection. Creatinine improved from 2.22 to 1.35 at discharge. GFR was 41. Discussed the importance of hydration to prevent future episodes. - Order BMP to monitor creatinine and GFR - Encourage adequate hydration  Urinary Tract Infection (UTI) with Cystitis UTI with cystitis confirmed by CT showing inflammatory stranding around the bladder. Urine culture showed Citrobacter species resistant to Bactrim. Treated with IV Zosyn and cephalosporin, with improvement noted. Discussed the need for follow-up with urology to ensure resolution and prevent recurrence. - Follow-up with urology on June 18, 2023   Chronic Anemia and Thrombocytopenia Chronic anemia and thrombocytopenia with hemoglobin 9.4 and platelets 132 at discharge. Discussed the need for regular monitoring. - Order CBC to monitor hemoglobin and platelets  General Health Maintenance Discussed the importance of hydration and monitoring blood pressure. - Encourage adequate hydration - Monitor blood pressure at home  Follow-up - Follow-up with primary care in early February 2025 - Follow-up with endocrinologist Dr. Gershon Crane next month - Follow-up with urology on June 18, 2023.      Return in about 2 months (around 07/29/2023) for HTN.         [name], MD  [facility] [phone] (phone) [phone] (fax)  [facility]

## 2023-05-28 ENCOUNTER — Ambulatory Visit (INDEPENDENT_AMBULATORY_CARE_PROVIDER_SITE_OTHER): Payer: Medicare HMO | Admitting: Family Medicine

## 2023-05-28 ENCOUNTER — Encounter: Payer: Self-pay | Admitting: Family Medicine

## 2023-05-28 VITALS — BP 154/64 | HR 58 | Temp 97.6°F | Resp 14 | Ht 61.0 in | Wt 173.9 lb

## 2023-05-28 DIAGNOSIS — E785 Hyperlipidemia, unspecified: Secondary | ICD-10-CM | POA: Diagnosis not present

## 2023-05-28 DIAGNOSIS — R29898 Other symptoms and signs involving the musculoskeletal system: Secondary | ICD-10-CM | POA: Diagnosis not present

## 2023-05-28 DIAGNOSIS — E861 Hypovolemia: Secondary | ICD-10-CM

## 2023-05-28 DIAGNOSIS — I10 Essential (primary) hypertension: Secondary | ICD-10-CM

## 2023-05-28 DIAGNOSIS — Z09 Encounter for follow-up examination after completed treatment for conditions other than malignant neoplasm: Secondary | ICD-10-CM

## 2023-05-28 DIAGNOSIS — N3000 Acute cystitis without hematuria: Secondary | ICD-10-CM | POA: Diagnosis not present

## 2023-05-28 DIAGNOSIS — E1142 Type 2 diabetes mellitus with diabetic polyneuropathy: Secondary | ICD-10-CM

## 2023-05-28 DIAGNOSIS — E1169 Type 2 diabetes mellitus with other specified complication: Secondary | ICD-10-CM

## 2023-05-28 NOTE — Patient Instructions (Signed)
VISIT SUMMARY:  During your visit, we reviewed your recent hospital stay and addressed several ongoing health issues. You were treated for a urinary tract infection and acute kidney injury, and we discussed the importance of hydration and follow-up care. We also reviewed your diabetes management, blood pressure, and other chronic conditions. We have made some adjustments to your medications and provided recommendations for monitoring your health at home.  YOUR PLAN:  -ACUTE KIDNEY INJURY (AKI) ON CHRONIC KIDNEY DISEASE (CKD) STAGE 3B: You experienced a worsening of your chronic kidney disease due to dehydration and infection. Your kidney function has improved, but it's important to stay hydrated to prevent future issues. We will monitor your kidney function with regular blood tests.  -URINARY TRACT INFECTION (UTI) WITH CYSTITIS: You had a urinary tract infection that affected your bladder. You were treated with antibiotics, and we need to follow up with urology to ensure the infection is completely resolved and to prevent it from coming back.  -TYPE 2 DIABETES MELLITUS: Your diabetes is being managed with several medications. We have stopped Mounjaro due to the risk of dehydration. It's important to balance your blood sugar levels while avoiding dehydration and low blood sugar. We will check your A1c at your next visit.  -HYPERTENSION: Your blood pressure has been fluctuating. It's important to monitor it at home twice a week and record the readings. We will review these readings at your next visit.  -PERIPHERAL NEUROPATHY: You have decreased sensation in your feet due to nerve damage. We will refer you to a podiatrist for further management and it's important to take good care of your feet to avoid injuries.  -CHRONIC ANEMIA AND THROMBOCYTOPENIA: You have low levels of red blood cells and platelets. We will monitor these levels with regular blood tests.  -GENERAL HEALTH MAINTENANCE: It's important  to stay hydrated and monitor your blood pressure at home. These steps will help manage your overall health.  INSTRUCTIONS:  Please follow up with your primary care doctor in early February 2025, with your endocrinologist Dr. Gershon Crane next month, and with urology on June 18, 2023. Monitor your blood pressure at home twice a week and record the readings. Stay hydrated and take good care of your feet to avoid injuries.

## 2023-05-28 NOTE — Assessment & Plan Note (Addendum)
Type 2 diabetes managed with metformin, Jardiance, and insulin. Mounjaro paused due to dehydration risk. A1c was 7.4 in July. Discussed balancing blood sugar control with dehydration and hypoglycemia risks. - Stop Mounjaro - DM foot exam completed today, abnormal monofilament testing  - Continue metformin 500 mg twice a day - Continue Jardiance 10 mg daily - Continue insulin 24 units once a day - Recheck A1c at next visit - continue to follow up with endocrinology as scheduled

## 2023-05-28 NOTE — Assessment & Plan Note (Signed)
Resolved on BP check today

## 2023-05-28 NOTE — Assessment & Plan Note (Signed)
Peripheral neuropathy with decreased sensation in feet. Plan to refer to podiatry for further management. Discussed the importance of foot care and monitoring for sores or injuries. Chronic - Refer to podiatry for neuropathy management

## 2023-05-28 NOTE — Assessment & Plan Note (Signed)
Hypertension with fluctuating blood pressure. Improved from hypotension at admission to 152/74 at discharge. Current BP 154/64. Discussed the importance of home monitoring and risks of both high and low blood pressure. Chronic  Do not recommend additional antihypertensives due to intermittent hypotension and dizziness  Will have patient monitor BP and follow up in 6-8 weeks for reassessment without medications that may have contributed to hypotension  - Monitor blood pressure at home twice a week and record readings - Follow-up in early February 2025 - continue irbesartan 75mg , coreg 25mg  BID and imdur 30mg  daily

## 2023-05-29 ENCOUNTER — Other Ambulatory Visit: Payer: Self-pay

## 2023-05-29 LAB — CBC
Hematocrit: 32.7 % — ABNORMAL LOW (ref 34.0–46.6)
Hemoglobin: 10.7 g/dL — ABNORMAL LOW (ref 11.1–15.9)
MCH: 29.6 pg (ref 26.6–33.0)
MCHC: 32.7 g/dL (ref 31.5–35.7)
MCV: 91 fL (ref 79–97)
Platelets: 199 10*3/uL (ref 150–450)
RBC: 3.61 x10E6/uL — ABNORMAL LOW (ref 3.77–5.28)
RDW: 15.2 % (ref 11.7–15.4)
WBC: 7.1 10*3/uL (ref 3.4–10.8)

## 2023-05-29 LAB — BMP8+EGFR
BUN/Creatinine Ratio: 11 — ABNORMAL LOW (ref 12–28)
BUN: 16 mg/dL (ref 8–27)
CO2: 15 mmol/L — ABNORMAL LOW (ref 20–29)
Calcium: 7.8 mg/dL — ABNORMAL LOW (ref 8.7–10.3)
Chloride: 110 mmol/L — ABNORMAL HIGH (ref 96–106)
Creatinine, Ser: 1.5 mg/dL — ABNORMAL HIGH (ref 0.57–1.00)
Glucose: 68 mg/dL — ABNORMAL LOW (ref 70–99)
Potassium: 3.8 mmol/L (ref 3.5–5.2)
Sodium: 144 mmol/L (ref 134–144)
eGFR: 36 mL/min/{1.73_m2} — ABNORMAL LOW (ref >59)

## 2023-05-29 LAB — CK: Total CK: 63 U/L (ref 32–182)

## 2023-05-29 NOTE — Patient Outreach (Signed)
Care Management  Transitions of Care Program Transitions of Care Post-discharge week 2   05/29/2023 Name: Cynthia Sims MRN: [id number] DOB: 1951-05-18  Subjective: Cynthia Sims is a 72 y.o. year old female who is a primary care patient of Simmons-Robinson, Tawanna Cooler, MD. The Care Management team Engaged with patient Engaged with patient by telephone to assess and address transitions of care needs.   Consent to Services:  Patient was given information about care management services, agreed to services, and gave verbal consent to participate.   Assessment:           SDOH Interventions    Flowsheet Row Patient Outreach from 05/29/2023 in [place] POPULATION HEALTH DEPARTMENT Telephone from 05/22/2023 in [place] [facility] POPULATION HEALTH DEPARTMENT Telephone from 03/05/2023 in [facility] Community Care Coordination Clinical Support from 10/04/2022 in [facility] Family Practice Office Visit from 05/22/2022 in [facility] Family Practice Office Visit from 02/20/2022 in [facility] Family Practice  SDOH Interventions        Food Insecurity Interventions Intervention Not Indicated Intervention Not Indicated -- Intervention Not Indicated -- --  Housing Interventions Intervention Not Indicated Intervention Not Indicated -- Intervention Not Indicated -- --  Transportation Interventions Intervention Not Indicated Intervention Not Indicated -- Intervention Not Indicated -- --  Utilities Interventions Intervention Not Indicated Intervention Not Indicated Intervention Not Indicated Intervention Not Indicated -- --  Alcohol Usage Interventions -- -- -- Intervention Not Indicated (Score <7) -- --  Depression Interventions/Treatment  -- -- -- -- Currently on Treatment PHQ2-9 Score <4 Follow-up Not Indicated  Financial Strain Interventions -- -- -- Intervention Not Indicated -- --  Physical Activity Interventions -- -- -- Intervention Not Indicated -- --   Stress Interventions -- -- -- Intervention Not Indicated -- --  Social Connections Interventions -- -- -- Intervention Not Indicated -- --        Goals Addressed             This Visit's Progress    TOC Care Plan   On track     Current Barriers:  Knowledge Deficits related to plan of care for management of DMII  Chronic Disease Management support and education needs related to DMII  RNCM Clinical Goal(s):  Patient will verbalize understanding of plan for management of DMII as evidenced by decrease in A1C verbalize basic understanding of DMII disease process and self health management plan as evidenced by Monitoring blood sugars as per ordered take all medications exactly as prescribed and will call provider for medication related questions as evidenced by any high elevations or low blood sugars    attend all scheduled medical appointments: Consistency as evidenced by showing a decrease in your A1C        continue to work with RN Care Manager and/or Social Worker to address care management and care coordination needs related to DMII as evidenced by adherence to CM Team Scheduled appointments     demonstrate ongoing self health care management ability to monitor BS and manage diet as evidenced by     decrease in A1C  Interventions: Evaluation of current treatment plan related to  self management and patient's adherence to plan as established by provider   Patient Goals/Self-Care Activities: Take medications as prescribed   Attend all scheduled provider appointments Call pharmacy for medication refills 3-7 days in advance of running out of medications Call provider office for new concerns or questions  check blood sugar at prescribed times: three times daily check  feet daily for cuts, sores or redness enter blood sugar readings and medication or insulin into daily log take the blood sugar log to all doctor visits set a realistic goal   Follow Up call on Friday December 20th  at 9:30am       [facility] Outreach provided today to patient as a follow up to her PCP appointment and her progress in post hospitalization recovery. The PCP did not make changes. Recommended outpatient PT for strengthening because the patient states she feels weak. Recommended a referral to Podiatry for neuropathy. The patient states she is driving so [facility] services are not likely because she is not homebound. She reports no complaints or concerns. She states she and her husband mostly sit around the house and watch TV. So far his dementia is controllable. RNCM to follow up Friday and assess any intervention needed for PT and Podiatry referral.  Deidre Ala, RN RN Care Manager [facility] [phone]

## 2023-05-29 NOTE — Patient Instructions (Signed)
Visit Information  Thank you for taking time to visit with me today. Please don't hesitate to contact me if I can be of assistance to you before our next scheduled telephone appointment.   Following is a copy of your care plan:   Goals Addressed             This Visit's Progress    TOC Care Plan   On track     Current Barriers:  Knowledge Deficits related to plan of care for management of DMII  Chronic Disease Management support and education needs related to DMII  RNCM Clinical Goal(s):  Patient will verbalize understanding of plan for management of DMII as evidenced by decrease in A1C verbalize basic understanding of DMII disease process and self health management plan as evidenced by Monitoring blood sugars as per ordered take all medications exactly as prescribed and will call provider for medication related questions as evidenced by any high elevations or low blood sugars    attend all scheduled medical appointments: Consistency as evidenced by showing a decrease in your A1C        continue to work with RN Care Manager and/or Social Worker to address care management and care coordination needs related to DMII as evidenced by adherence to CM Team Scheduled appointments     demonstrate ongoing self health care management ability to monitor BS and manage diet as evidenced by     decrease in A1C  Interventions: Evaluation of current treatment plan related to  self management and patient's adherence to plan as established by provider   Patient Goals/Self-Care Activities: Take medications as prescribed   Attend all scheduled provider appointments Call pharmacy for medication refills 3-7 days in advance of running out of medications Call provider office for new concerns or questions  check blood sugar at prescribed times: three times daily check feet daily for cuts, sores or redness enter blood sugar readings and medication or insulin into daily log take the blood sugar log to  all doctor visits set a realistic goal   Follow Up call on Friday December 20th at 9:30am        Patient verbalizes understanding of instructions and care plan provided today and agrees to view in MyChart. Active MyChart status and patient understanding of how to access instructions and care plan via MyChart confirmed with patient.     Telephone follow up appointment with care management team member scheduled for: Friday December 20th 9:30am  Please call the care guide team at [phone] if you need to cancel or reschedule your appointment.   Please call the Suicide and Crisis Lifeline: 988 call the [place] National Suicide Prevention Lifeline: 506 395 6009 or TTY: (650) 061-6191 TTY 709-566-5692) to talk to a trained counselor if you are experiencing a Mental Health or Behavioral Health Crisis or need someone to talk to.  Deidre Ala, RN Medical illustrator [facility] [phone]

## 2023-05-30 DIAGNOSIS — E1142 Type 2 diabetes mellitus with diabetic polyneuropathy: Secondary | ICD-10-CM | POA: Diagnosis not present

## 2023-06-01 ENCOUNTER — Other Ambulatory Visit: Payer: Self-pay

## 2023-06-01 ENCOUNTER — Telehealth: Payer: Self-pay

## 2023-06-01 NOTE — Patient Outreach (Signed)
Care Management  Transitions of Care Program Transitions of Care Post-discharge week 3   06/01/2023 Name: Cynthia Sims MRN: [id number] DOB: 04/27/1951  Subjective: Cynthia Sims is a 72 y.o. year old female who is a primary care patient of Simmons-Robinson, Tawanna Cooler, MD. The Care Management team Engaged with patient Engaged with patient by telephone to assess and address transitions of care needs.   Consent to Services:  Patient was given information about care management services, agreed to services, and gave verbal consent to participate.   Assessment: TOC today after several attempts. The patient did have her follow up appointment with her PCP this week. The provider recommended Outpatient PT because her husband told the doctor that she is a little weak. The patient states she is not going. A referral has been made to the podiatrist for neuropathy and the patient states she has not heard from the podiatrist. Discussed diabetic foot care and that her endocrinologist could help manage that if she declined the podiatrist. The patient states she cuts her own toenails and she keeps an eye on her feet to assess for sores, redness or ingrown nails. Her last A1c was 7.4  SDOH Interventions    Flowsheet Row Patient Outreach from 05/29/2023 in [place] POPULATION HEALTH DEPARTMENT Telephone from 05/22/2023 in [place] POPULATION HEALTH DEPARTMENT Telephone from 03/05/2023 in [facility] Community Care Coordination Clinical Support from 10/04/2022 in [facility] Family Practice Office Visit from 05/22/2022 in [facility] Family Practice Office Visit from 02/20/2022 in [facility] Family Practice  SDOH Interventions        Food Insecurity Interventions Intervention Not Indicated Intervention Not Indicated -- Intervention Not Indicated -- --  Housing Interventions Intervention Not Indicated Intervention Not Indicated -- Intervention Not Indicated -- --   Transportation Interventions Intervention Not Indicated Intervention Not Indicated -- Intervention Not Indicated -- --  Utilities Interventions Intervention Not Indicated Intervention Not Indicated Intervention Not Indicated Intervention Not Indicated -- --  Alcohol Usage Interventions -- -- -- Intervention Not Indicated (Score <7) -- --  Depression Interventions/Treatment  -- -- -- -- Currently on Treatment PHQ2-9 Score <4 Follow-up Not Indicated  Financial Strain Interventions -- -- -- Intervention Not Indicated -- --  Physical Activity Interventions -- -- -- Intervention Not Indicated -- --  Stress Interventions -- -- -- Intervention Not Indicated -- --  Social Connections Interventions -- -- -- Intervention Not Indicated -- --        Goals Addressed             This Visit's Progress    COMPLETED: TOC Care Plan        Current Barriers:  Knowledge Deficits related to plan of care for management of DMII  Chronic Disease Management support and education needs related to DMII  RNCM Clinical Goal(s):  Patient will verbalize understanding of plan for management of DMII as evidenced by decrease in A1C verbalize basic understanding of DMII disease process and self health management plan as evidenced by Monitoring blood sugars as per ordered take all medications exactly as prescribed and will call provider for medication related questions as evidenced by any high elevations or low blood sugars    attend all scheduled medical appointments: Consistency as evidenced by showing a decrease in your A1C        continue to work with RN Care Manager and/or Social Worker to address care management and care coordination needs related to DMII as evidenced by adherence to CM  Team Scheduled appointments     demonstrate ongoing self health care management ability to monitor BS and manage diet as evidenced by     decrease in A1C  Interventions: Evaluation of current treatment plan related to  self  management and patient's adherence to plan as established by provider   Patient Goals/Self-Care Activities: Take medications as prescribed   Attend all scheduled provider appointments Call pharmacy for medication refills 3-7 days in advance of running out of medications Call provider office for new concerns or questions  check blood sugar at prescribed times: three times daily check feet daily for cuts, sores or redness enter blood sugar readings and medication or insulin into daily log take the blood sugar log to all doctor visits set a realistic goal        The patient states she has enough appointments and does not want to attend physical therapy and feels that she is okay at home without further TOC interventions.   The patient has been provided with contact information for the care management team and has been advised to call with any health-related questions or concerns. The patient verbalized understanding with current POC. The patient is directed to their insurance card regarding availability of benefits coverage.   Deidre Ala, RN Medical illustrator [facility] [phone]

## 2023-06-01 NOTE — Patient Instructions (Signed)
Visit Information  Thank you for taking time to visit with me today. Please don't hesitate to contact me if I can be of assistance to you before our next scheduled telephone appointment.   Following is a copy of your care plan:   Goals Addressed             This Visit's Progress    COMPLETED: TOC Care Plan        Current Barriers:  Knowledge Deficits related to plan of care for management of DMII  Chronic Disease Management support and education needs related to DMII  RNCM Clinical Goal(s):  Patient will verbalize understanding of plan for management of DMII as evidenced by decrease in A1C verbalize basic understanding of DMII disease process and self health management plan as evidenced by Monitoring blood sugars as per ordered take all medications exactly as prescribed and will call provider for medication related questions as evidenced by any high elevations or low blood sugars    attend all scheduled medical appointments: Consistency as evidenced by showing a decrease in your A1C        continue to work with RN Care Manager and/or Social Worker to address care management and care coordination needs related to DMII as evidenced by adherence to CM Team Scheduled appointments     demonstrate ongoing self health care management ability to monitor BS and manage diet as evidenced by     decrease in A1C  Interventions: Evaluation of current treatment plan related to  self management and patient's adherence to plan as established by provider   Patient Goals/Self-Care Activities: Take medications as prescribed   Attend all scheduled provider appointments Call pharmacy for medication refills 3-7 days in advance of running out of medications Call provider office for new concerns or questions  check blood sugar at prescribed times: three times daily check feet daily for cuts, sores or redness enter blood sugar readings and medication or insulin into daily log take the blood sugar log to  all doctor visits set a realistic goal         Patient verbalizes understanding of instructions and care plan provided today and agrees to view in MyChart. Active MyChart status and patient understanding of how to access instructions and care plan via MyChart confirmed with patient.      Please call the Suicide and Crisis Lifeline: 988 call the [place] National Suicide Prevention Lifeline: 423-164-7244 or TTY: 940-787-1566 TTY (760)774-0047) to talk to a trained counselor if you are experiencing a Mental Health or Behavioral Health Crisis or need someone to talk to. Deidre Ala, RN Medical illustrator [facility] [phone]

## 2023-06-04 ENCOUNTER — Other Ambulatory Visit: Payer: Self-pay | Admitting: Physician Assistant

## 2023-06-04 DIAGNOSIS — N183 Chronic kidney disease, stage 3 unspecified: Secondary | ICD-10-CM

## 2023-06-08 ENCOUNTER — Other Ambulatory Visit: Payer: Self-pay | Admitting: Physician Assistant

## 2023-06-08 DIAGNOSIS — R197 Diarrhea, unspecified: Secondary | ICD-10-CM

## 2023-06-11 ENCOUNTER — Encounter: Payer: Self-pay | Admitting: Family Medicine

## 2023-06-12 NOTE — Telephone Encounter (Signed)
 Requested Prescriptions  Pending Prescriptions Disp Refills   cholestyramine  (QUESTRAN ) 4 g packet [Pharmacy Med Name: Cholestyramine  4 GM Oral Packet] 270 each 0    Sig: DISSOLVE AND TAKE ONE PACKET BY MOUTH THREE TIMES DAILY     Cardiovascular:  Antilipid - Bile Acid Sequestrants Failed - 06/12/2023  5:36 PM      Failed - Lipid Panel in normal range within the last 12 months    Cholesterol, Total  Date Value Ref Range Status  12/25/2022 131 100 - 199 mg/dL Final   LDL Chol Calc (NIH)  Date Value Ref Range Status  12/25/2022 45 0 - 99 mg/dL Final   HDL  Date Value Ref Range Status  12/25/2022 58 >39 mg/dL Final   Triglycerides  Date Value Ref Range Status  12/25/2022 168 (H) 0 - 149 mg/dL Final         Passed - Valid encounter within last 12 months    Recent Outpatient Visits           2 weeks ago Hospital discharge follow-up   [facility] Simmons-Robinson, [place], MD   1 month ago Hypoglycemia due to type 2 diabetes mellitus (HCC)   [place] [facility] Simmons-Robinson, [place], MD   2 months ago Flu vaccine need   [facility] Simmons-Robinson, [place], MD   5 months ago Stage 3 chronic kidney disease, unspecified whether stage 3a or 3b CKD (HCC)   [facility] Simmons-Robinson, [place], MD   9 months ago Screening for colon cancer   [facility] Simmons-Robinson, [place], MD       Future Appointments             In 6 days MacDiarmid, [place], MD [facility] [place]   In 1 month Simmons-Robinson, [place], MD [facility]   In 2 months Simmons-Robinson, [place], MD [facility] [place]

## 2023-06-18 ENCOUNTER — Ambulatory Visit: Payer: Medicare HMO | Admitting: Urology

## 2023-06-28 DIAGNOSIS — Z7984 Long term (current) use of oral hypoglycemic drugs: Secondary | ICD-10-CM | POA: Diagnosis not present

## 2023-06-28 DIAGNOSIS — H35033 Hypertensive retinopathy, bilateral: Secondary | ICD-10-CM | POA: Diagnosis not present

## 2023-06-28 DIAGNOSIS — E119 Type 2 diabetes mellitus without complications: Secondary | ICD-10-CM | POA: Diagnosis not present

## 2023-06-28 DIAGNOSIS — Z961 Presence of intraocular lens: Secondary | ICD-10-CM | POA: Diagnosis not present

## 2023-06-28 LAB — HM DIABETES EYE EXAM

## 2023-06-30 ENCOUNTER — Other Ambulatory Visit: Payer: Self-pay | Admitting: Internal Medicine

## 2023-06-30 DIAGNOSIS — E1142 Type 2 diabetes mellitus with diabetic polyneuropathy: Secondary | ICD-10-CM | POA: Diagnosis not present

## 2023-07-03 ENCOUNTER — Ambulatory Visit: Payer: Medicare HMO | Admitting: Podiatry

## 2023-07-03 ENCOUNTER — Encounter: Payer: Self-pay | Admitting: Podiatry

## 2023-07-03 DIAGNOSIS — I152 Hypertension secondary to endocrine disorders: Secondary | ICD-10-CM | POA: Diagnosis not present

## 2023-07-03 DIAGNOSIS — E1169 Type 2 diabetes mellitus with other specified complication: Secondary | ICD-10-CM | POA: Diagnosis not present

## 2023-07-03 DIAGNOSIS — E1159 Type 2 diabetes mellitus with other circulatory complications: Secondary | ICD-10-CM | POA: Diagnosis not present

## 2023-07-03 DIAGNOSIS — E6609 Other obesity due to excess calories: Secondary | ICD-10-CM | POA: Diagnosis not present

## 2023-07-03 DIAGNOSIS — E119 Type 2 diabetes mellitus without complications: Secondary | ICD-10-CM

## 2023-07-03 DIAGNOSIS — Z6831 Body mass index (BMI) 31.0-31.9, adult: Secondary | ICD-10-CM | POA: Diagnosis not present

## 2023-07-03 DIAGNOSIS — Z794 Long term (current) use of insulin: Secondary | ICD-10-CM | POA: Diagnosis not present

## 2023-07-03 DIAGNOSIS — E66811 Obesity, class 1: Secondary | ICD-10-CM | POA: Diagnosis not present

## 2023-07-03 DIAGNOSIS — E11649 Type 2 diabetes mellitus with hypoglycemia without coma: Secondary | ICD-10-CM | POA: Diagnosis not present

## 2023-07-03 DIAGNOSIS — B351 Tinea unguium: Secondary | ICD-10-CM

## 2023-07-03 DIAGNOSIS — M79674 Pain in right toe(s): Secondary | ICD-10-CM

## 2023-07-03 DIAGNOSIS — M79675 Pain in left toe(s): Secondary | ICD-10-CM

## 2023-07-03 DIAGNOSIS — Z0189 Encounter for other specified special examinations: Secondary | ICD-10-CM

## 2023-07-03 DIAGNOSIS — E785 Hyperlipidemia, unspecified: Secondary | ICD-10-CM | POA: Diagnosis not present

## 2023-07-03 DIAGNOSIS — E1142 Type 2 diabetes mellitus with diabetic polyneuropathy: Secondary | ICD-10-CM | POA: Diagnosis not present

## 2023-07-03 NOTE — Progress Notes (Signed)
   No chief complaint on file.   SUBJECTIVE Patient with a history of diabetes mellitus presents to office today complaining of elongated, thickened nails that cause pain while ambulating in shoes.  Patient is unable to trim their own nails.  Patient also presents for annual diabetic foot exam.  Patient is here for further evaluation and treatment.  Past Medical History:  Diagnosis Date   ALT (SGPT) level raised    Anemia    Anxiety    Arthritis    Depression    Diabetes mellitus without complication (HCC)    Elevated cholesterol    GERD (gastroesophageal reflux disease)    Hemorrhoids    Hypertension    Kidney disease     No Known Allergies   OBJECTIVE General Patient is awake, alert, and oriented x 3 and in no acute distress. Derm Skin is dry with superficial fissures throughout the skin and slight hyperkeratosis.  Nails are tender, long, thickened and dystrophic with subungual debris, consistent with onychomycosis, 1-5 bilateral. No signs of infection noted. Vasc the skin is cool to touch.  Delayed capillary refill.  However the patient is completely asymptomatic so we will observe for now Neuro light touch and protective threshold sensation diminished bilaterally.  Musculoskeletal Exam No symptomatic pedal deformities noted bilateral. Muscular strength within normal limits.  ASSESSMENT 1. Diabetes Mellitus w/ peripheral neuropathy 2.  Pain due to onychomycosis of toenails bilateral 3.  Encounter for diabetic foot exam  PLAN OF CARE 1. Patient evaluated today. Comprehensive diabetic foot exam performed today 2. Instructed to maintain good pedal hygiene and foot care. Stressed importance of controlling blood sugar.  3. Mechanical debridement of nails 1-5 bilaterally performed using a nail nipper. Filed with dremel without incident.  4.  Patient admits to going barefoot throughout the day.  Advised against going barefoot.  Recommend good supportive shoes and sneakers with  daily lotion  5.  Return to clinic in 3 mos.     Felecia Shelling, DPM [facility]  Dr. Felecia Shelling, DPM    [address] [place], [place] [address]                Office [phone]  Fax [phone]

## 2023-07-14 ENCOUNTER — Other Ambulatory Visit: Payer: Self-pay | Admitting: Physician Assistant

## 2023-07-14 ENCOUNTER — Other Ambulatory Visit (INDEPENDENT_AMBULATORY_CARE_PROVIDER_SITE_OTHER): Payer: Self-pay | Admitting: Vascular Surgery

## 2023-07-16 ENCOUNTER — Ambulatory Visit: Payer: HMO | Admitting: Urology

## 2023-07-16 ENCOUNTER — Telehealth: Payer: Self-pay

## 2023-07-16 DIAGNOSIS — Z09 Encounter for follow-up examination after completed treatment for conditions other than malignant neoplasm: Secondary | ICD-10-CM

## 2023-07-16 DIAGNOSIS — N3020 Other chronic cystitis without hematuria: Secondary | ICD-10-CM

## 2023-07-16 LAB — MICROSCOPIC EXAMINATION: WBC, UA: >30 /[HPF] — AB (ref 0–5)

## 2023-07-16 LAB — URINALYSIS, COMPLETE
Bilirubin, UA: NEGATIVE
Ketones, UA: NEGATIVE
Nitrite, UA: POSITIVE — AB
Specific Gravity, UA: 1.025 (ref 1.005–1.030)
Urobilinogen, Ur: 0.2 mg/dL (ref 0.2–1.0)
pH, UA: 5.5 (ref 5.0–7.5)

## 2023-07-16 MED ORDER — TRIMETHOPRIM 100 MG PO TABS: 100.0000 mg | ORAL_TABLET | Freq: Every day | ORAL | 11 refills | Status: DC

## 2023-07-16 NOTE — Telephone Encounter (Signed)
Copied from CRM [id number]. Topic: Appointment Scheduling - Scheduling Inquiry for Clinic >> Jul 16, 2023 11:46 AM Franchot Heidelberg wrote: Reason for CRM: Pt wants to add having a PAP added to one of her upcoming appts.

## 2023-07-16 NOTE — Telephone Encounter (Signed)
We can complete pap smear at 07/30/23 visit, visit notes updated

## 2023-07-16 NOTE — Patient Instructions (Signed)

## 2023-07-16 NOTE — Progress Notes (Signed)
07/16/2023 10:55 AM   Cynthia Sims 20-Dec-1950 [id number]  Referring provider: [name], MD [address] [place],  [place] [address]  Chief Complaint  Patient presents with   Follow-up   Cystitis    HPI: Dr Kathie Rhodes: I had the pleasure of seeing Cynthia Sims in urology clinic today in consultation for urinary retention and recurrent UTI from Dr. Beryle Flock.  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  She was previously followed by urogynecology at [facility] and had urodynamics in 2016.  Urodynamics showed no evidence of detrusor overactivity.  There was reduced bladder sensation, normal compliance, and atonic detrusor.  She was offered InterStim at that time, but that she did not follow-up.  Baseline creatinine is approximately 1.4.  Recent CT in May 2019 did not show any hydronephrosis.  She is also had difficulty with recurrent UTIs recently, including Klebsiella 02/20/2018.  She does have some pelvic pain with UTI.  However, on chart review it is unclear if she has been treated for asymptomatic bacteriuria versus true urinary tract infection.     She has previously undergone pelvic floor physical therapy which did not improve her voiding.   Today Incomplete bladder emptying stable.  Clinically not infected.  I agree that sometimes she gets cloudy urine that comes and goes.  She is currently on a once a day antibiotic and she is not certain if it is helping.  I am not convinced that she is getting many symptomatic urinary tract infections.  I will not send today's urine and overtreated   She still catheterizing 4 times a day.  She is continent.  She does feel bladder fullness.  Other than oral hypoglycemics she does not have other neurologic issues   We talked about InterStim in detail.  Usual template detail.  The role of the test stimulation discussed.  Recognizing she  has retention I still offered the PNE and not the surgical lead stage I.  We will try to leave it in longer.  Based upon the clinical presentation and assessment at [facility] I did not recommend repeat testing.  If she did have InterStim could be done here locally     she wants to think about it.  I gave her my phone number in [place] if she ever to have the test stimulation.  I can understand why she is hesitant.  I think the chance of success is likely 10 to 20% but not higher.  Otherwise she will be seen as needed.  Treat only symptomatic urinary tract infections    Treated Incomplete bladder emptying stable.  I saw the patient in 2019 No infections.  Continent in between catheterization 4 times a day  Patient is stable incomplete bladder emptying. We will get a 2 years or so. Call if abnormal. Were not can to proceed with test stimulation. I think the chance of helping is approximately 10% or so   Today Patient catheterizes 4 times a day.  She is continent.  She says he was recently admitted to the hospital.  She thinks she may have a bladder infection now because the urine is strong smelling but no other symptoms. Patient had a CT scan May 18, 2023.  No hydronephrosis.  Kidneys look healthy.  She had little bit of inflammatory changes around the bladder keeping with cystitis.  She had a urine culture September 2024 and it was negative  Patient  was in the hospital in December for chest pain.  She was having some intermittent abdominal pain.  The note said she had Citrobacter in the urine and was given Rocephin.  I actually did not see any urine cultures separate and blood cultures were negative  Patient a month ago had a little bit of blood on the light pad that she wears for confidence.  She says she did not see blood in the catheter.    PMH: Past Medical History:  Diagnosis Date   ALT (SGPT) level raised    Anemia    Anxiety    Arthritis    Depression    Diabetes mellitus  without complication (HCC)    Elevated cholesterol    GERD (gastroesophageal reflux disease)    Hemorrhoids    Hypertension    Kidney disease     Surgical History: Past Surgical History:  Procedure Laterality Date   abdominal blockage     CESAREAN SECTION  1983   CESAREAN SECTION     CHOLECYSTECTOMY     COLONOSCOPY WITH PROPOFOL N/A 10/29/2015   Procedure: COLONOSCOPY WITH PROPOFOL;  Surgeon: Scot Jun, MD;  Location: [facility] ENDOSCOPY;  Service: Endoscopy;  Laterality: N/A;   COLONOSCOPY WITH PROPOFOL N/A 11/07/2017   Procedure: COLONOSCOPY WITH PROPOFOL;  Surgeon: Toledo, Boykin Nearing, MD;  Location: [facility] ENDOSCOPY;  Service: Gastroenterology;  Laterality: N/A;   DILATION AND CURETTAGE OF UTERUS  2001   ESOPHAGOGASTRODUODENOSCOPY (EGD) WITH PROPOFOL N/A 10/29/2015   Procedure: ESOPHAGOGASTRODUODENOSCOPY (EGD) WITH PROPOFOL;  Surgeon: Scot Jun, MD;  Location: [facility] ENDOSCOPY;  Service: Endoscopy;  Laterality: N/A;   LOWER EXTREMITY ANGIOGRAPHY Right 10/24/2022   Procedure: Lower Extremity Angiography;  Surgeon: Renford Dills, MD;  Location: [facility] INVASIVE CV LAB;  Service: Cardiovascular;  Laterality: Right;   LOWER EXTREMITY ANGIOGRAPHY Left 10/31/2022   Procedure: Lower Extremity Angiography;  Surgeon: Renford Dills, MD;  Location: [facility] INVASIVE CV LAB;  Service: Cardiovascular;  Laterality: Left;    Home Medications:  Allergies as of 07/16/2023   No Known Allergies      Medication List        Accurate as of July 16, 2023 10:55 AM. If you have any questions, ask your nurse or doctor.          acetaminophen 500 MG tablet Commonly known as: TYLENOL Take 500 mg by mouth every 6 (six) hours as needed.   aspirin EC 81 MG tablet Take 81 mg by mouth daily.   Basaglar KwikPen 100 UNIT/ML INJECT 24 UNITS SUBCUTANEOUSLY ONCE DAILY   carvedilol 25 MG tablet Commonly known as: COREG Take 1 tablet by mouth twice daily   cholestyramine 4 g packet Commonly  known as: QUESTRAN DISSOLVE AND TAKE ONE PACKET BY MOUTH THREE TIMES DAILY   clopidogrel 75 MG tablet Commonly known as: PLAVIX Take 1 tablet by mouth once daily   cyanocobalamin 1000 MCG tablet Take 1,000 mcg by mouth daily.   DULoxetine 60 MG capsule Commonly known as: CYMBALTA Take 1 capsule by mouth once daily   gabapentin 100 MG capsule Commonly known as: NEURONTIN TAKE 1 CAPSULE BY MOUTH THREE TIMES DAILY   Gvoke HypoPen 2-Pack 1 MG/0.2ML Soaj Generic drug: Glucagon Inject 1 mg as directed as directed.  INJECT 1 PEN IN CASE OF SEVERE HYPOGLYCEMIA   irbesartan 75 MG tablet Commonly known as: AVAPRO Take 1 tablet (75 mg total) by mouth daily.   isosorbide mononitrate 30 MG 24 hr tablet Commonly known as: IMDUR  Take 1 tablet (30 mg total) by mouth daily.   Jardiance 10 MG Tabs tablet Generic drug: empagliflozin Take 10 mg by mouth every morning.   lovastatin 40 MG tablet Commonly known as: MEVACOR Take 1 tablet by mouth once daily   metFORMIN 500 MG tablet Commonly known as: GLUCOPHAGE Take 500 mg by mouth 2 (two) times daily.   nitroGLYCERIN 0.4 MG SL tablet Commonly known as: NITROSTAT Place 1 tablet (0.4 mg total) under the tongue every 5 (five) minutes as needed for chest pain.   ondansetron 4 MG disintegrating tablet Commonly known as: ZOFRAN-ODT Take 1 tablet (4 mg total) by mouth every 8 (eight) hours as needed for nausea or vomiting.   pantoprazole 40 MG tablet Commonly known as: Protonix Take 1 tablet (40 mg total) by mouth daily.        Allergies: No Known Allergies  Family History: Family History  Problem Relation Age of Onset   Alzheimer's disease Mother    COPD Mother    Breast cancer Mother 40   Osteoporosis Mother    Heart attack Father    Lupus Father    Diabetes Father    Hypertension Father    Diabetes Sister    COPD Maternal Grandmother    Diabetes Paternal Grandfather    Diabetes Sister    Diabetes Sister     Social  History:  reports that she has never smoked. She has never been exposed to tobacco smoke. She has never used smokeless tobacco. She reports that she does not currently use alcohol. She reports that she does not use drugs.  ROS:                                        Physical Exam: There were no vitals taken for this visit.  Constitutional:  Alert and oriented, No acute distress. HEENT: [place] AT, moist mucus membranes.  Trachea midline, no masses.   Laboratory Data: Lab Results  Component Value Date   WBC 7.1 05/28/2023   HGB 10.7 (L) 05/28/2023   HCT 32.7 (L) 05/28/2023   MCV 91 05/28/2023   PLT 199 05/28/2023    Lab Results  Component Value Date   CREATININE 1.50 (H) 05/28/2023    No results found for: "PSA"  No results found for: "TESTOSTERONE"  Lab Results  Component Value Date   HGBA1C 7.4 (H) 12/25/2022    Urinalysis    Component Value Date/Time   COLORURINE YELLOW (A) 05/18/2023 0011   APPEARANCEUR CLOUDY (A) 05/18/2023 0011   APPEARANCEUR Turbid (A) 04/27/2023 0000   LABSPEC 1.020 05/18/2023 0011   PHURINE 5.0 05/18/2023 0011   GLUCOSEU >=500 (A) 05/18/2023 0011   HGBUR SMALL (A) 05/18/2023 0011   BILIRUBINUR NEGATIVE 05/18/2023 0011   BILIRUBINUR Negative 04/27/2023 0000   KETONESUR NEGATIVE 05/18/2023 0011   PROTEINUR NEGATIVE 05/18/2023 0011   UROBILINOGEN 0.2 02/24/2019 1123   NITRITE NEGATIVE 05/18/2023 0011   LEUKOCYTESUR LARGE (A) 05/18/2023 0011    Pertinent Imaging: Urine reviewed and sent for culture.  She did have red blood cells in the urine  Assessment & Plan: I think would be best to place the patient on urinary prophylaxis.  The internist felt that she was having recurrent UTIs but the diagnosis of such is vague.  I will call if the culture is positive.  She will see primary care or gynecology to rule out uterine  bleeding.  For safety I will have her come back for cystoscopy on trimethoprim 100 mg 30 x 11.  1.  Chronic cystitis (Primary)  - Urinalysis, Complete   No follow-ups on file.  Martina Sinner, MD  [facility] [address] [place], [place] [address] [phone]

## 2023-07-17 NOTE — Telephone Encounter (Signed)
 Patient notified and verbalized understanding.

## 2023-07-20 LAB — CULTURE, URINE COMPREHENSIVE

## 2023-07-25 ENCOUNTER — Other Ambulatory Visit: Payer: Self-pay | Admitting: Family Medicine

## 2023-07-25 DIAGNOSIS — E1122 Type 2 diabetes mellitus with diabetic chronic kidney disease: Secondary | ICD-10-CM

## 2023-07-25 DIAGNOSIS — N183 Chronic kidney disease, stage 3 unspecified: Secondary | ICD-10-CM

## 2023-07-25 NOTE — Telephone Encounter (Signed)
Requested medication (s) are due for refill today: yes  Requested medication (s) are on the active medication list: yes  Last refill:  05/18/23   Future visit scheduled: yes  Notes to clinic:  historical provider, Unable to refill per protocol due to failed labs, no updated results.    Requested Prescriptions  Pending Prescriptions Disp Refills   metFORMIN (GLUCOPHAGE) 500 MG tablet [Pharmacy Med Name: metFORMIN HCl 500 MG Oral Tablet] 180 tablet     Sig: TAKE 1 TABLET BY MOUTH TWICE DAILY WITH A MEAL     Endocrinology:  Diabetes - Biguanides Failed - 07/25/2023  5:45 PM      Failed - Cr in normal range and within 360 days    Creatinine  Date Value Ref Range Status  03/01/2012 1.72 (H) 0.60 - 1.30 mg/dL Final   Creatinine, Ser  Date Value Ref Range Status  05/28/2023 1.50 (H) 0.57 - 1.00 mg/dL Final   Creatinine, POC  Date Value Ref Range Status  08/02/2017 NA mg/dL Final         Failed - HBA1C is between 0 and 7.9 and within 180 days    Hgb A1c MFr Bld  Date Value Ref Range Status  12/25/2022 7.4 (H) 4.8 - 5.6 % Final    Comment:             Prediabetes: 5.7 - 6.4          Diabetes: >6.4          Glycemic control for adults with diabetes: <7.0          Failed - eGFR in normal range and within 360 days    EGFR (African American)  Date Value Ref Range Status  03/01/2012 36 (L)  Final   GFR calc Af Amer  Date Value Ref Range Status  10/06/2019 41 (L) >59 mL/min/1.73 Final    Comment:    **[facility] currently reports eGFR in compliance with the current**   recommendations of the [employer]. [facility] will   update reporting as new guidelines are published from the NKF-ASN   Task force.    EGFR (Non-African Amer.)  Date Value Ref Range Status  03/01/2012 31 (L)  Final    Comment:    eGFR values <54mL/min/1.73 m2 may be an indication of chronic kidney disease (CKD). Calculated eGFR is useful in patients with stable renal function. The eGFR  calculation will not be reliable in acutely ill patients when serum creatinine is changing rapidly. It is not useful in  patients on dialysis. The eGFR calculation may not be applicable to patients at the low and high extremes of body sizes, pregnant women, and vegetarians.    GFR, Estimated  Date Value Ref Range Status  05/21/2023 41 (L) >60 mL/min Final    Comment:    (NOTE) Calculated using the CKD-EPI Creatinine Equation (2021)    eGFR  Date Value Ref Range Status  05/28/2023 36 (L) >59 mL/min/1.73 Final         Failed - B12 Level in normal range and within 720 days    No results found for: "VITAMINB12"       Passed - Valid encounter within last 6 months    Recent Outpatient Visits           1 month ago Hospital discharge follow-up   [place] [facility] Simmons-Robinson, Jessie, MD   2 months ago Hypoglycemia due to type 2 diabetes mellitus (HCC)   [place]   [facility] Simmons-Robinson, Financial controller, MD   4 months ago Flu vaccine need   [place] [facility] Simmons-Robinson, [place], MD   7 months ago Stage 3 chronic kidney disease, unspecified whether stage 3a or 3b CKD (HCC)   [place] [facility] Simmons-Robinson, [place], MD   11 months ago Screening for colon cancer   [place] [facility] Simmons-Robinson, [place], MD       Future Appointments             In 5 days Simmons-Robinson, Tawanna Cooler, MD [facility], [facility]   In 3 weeks [place], Bernadette Hoit, PA-C [employer] [facility] [employer], [facility]   In 1 month Simmons-Robinson, [place], MD [employer], [facility]            Passed - CBC within normal limits and completed in the last 12 months    WBC  Date Value Ref Range Status  05/28/2023 7.1 3.4 - 10.8 x10E3/uL Final  05/21/2023 4.1 4.0 - 10.5 K/uL Final   RBC  Date Value Ref Range Status  05/28/2023 3.61 (L) 3.77 -  5.28 x10E6/uL Final  05/21/2023 3.23 (L) 3.87 - 5.11 MIL/uL Final   Hemoglobin  Date Value Ref Range Status  05/28/2023 10.7 (L) 11.1 - 15.9 g/dL Final   Hematocrit  Date Value Ref Range Status  05/28/2023 32.7 (L) 34.0 - 46.6 % Final   MCHC  Date Value Ref Range Status  05/28/2023 32.7 31.5 - 35.7 g/dL Final  16/03/9603 54.0 30.0 - 36.0 g/dL Final   [facility]  Date Value Ref Range Status  05/28/2023 29.6 26.6 - 33.0 pg Final  05/21/2023 29.1 26.0 - 34.0 pg Final   MCV  Date Value Ref Range Status  05/28/2023 91 79 - 97 fL Final  03/01/2012 92 80 - 100 fL Final   No results found for: "PLTCOUNTKUC", "LABPLAT", "POCPLA" RDW  Date Value Ref Range Status  05/28/2023 15.2 11.7 - 15.4 % Final  03/01/2012 14.9 (H) 11.5 - 14.5 % Final         Refused Prescriptions Disp Refills   glipiZIDE (GLUCOTROL) 10 MG tablet [Pharmacy Med Name: glipiZIDE 10 MG Oral Tablet] 180 tablet     Sig: Take 1 tablet by mouth twice daily     Endocrinology:  Diabetes - Sulfonylureas Failed - 07/25/2023  5:45 PM      Failed - HBA1C is between 0 and 7.9 and within 180 days    Hgb A1c MFr Bld  Date Value Ref Range Status  12/25/2022 7.4 (H) 4.8 - 5.6 % Final    Comment:             Prediabetes: 5.7 - 6.4          Diabetes: >6.4          Glycemic control for adults with diabetes: <7.0          Failed - Cr in normal range and within 360 days    Creatinine  Date Value Ref Range Status  03/01/2012 1.72 (H) 0.60 - 1.30 mg/dL Final   Creatinine, Ser  Date Value Ref Range Status  05/28/2023 1.50 (H) 0.57 - 1.00 mg/dL Final   Creatinine, POC  Date Value Ref Range Status  08/02/2017 NA mg/dL Final         Passed - Valid encounter within last 6 months    Recent Outpatient Visits  1 month ago Hospital discharge follow-up   [place] [facility] Simmons-Robinson, [place], MD   2 months ago Hypoglycemia due to type 2 diabetes mellitus [facility])   [place] [facility] Simmons-Robinson, [place], MD   4 months ago Flu vaccine need   [place] [facility] Simmons-Robinson, [place], MD   7 months ago Stage 3 chronic kidney disease, unspecified whether stage 3a or 3b CKD (HCC)   [facility] Simmons-Robinson, Tawanna Cooler, MD   11 months ago Screening for colon cancer   [place] [facility] Simmons-Robinson, Tawanna Cooler, MD       Future Appointments             In 5 days Simmons-Robinson, Tawanna Cooler, MD [facility], [facility]   In 3 weeks [place], Bernadette Hoit, PA-C [employer] [facility] [employer], [facility]   In 1 month Simmons-Robinson, [place], MD [facility], [place]

## 2023-07-25 NOTE — Telephone Encounter (Signed)
Requested Prescriptions  Pending Prescriptions Disp Refills   glipiZIDE (GLUCOTROL) 10 MG tablet [Pharmacy Med Name: glipiZIDE 10 MG Oral Tablet] 180 tablet     Sig: Take 1 tablet by mouth twice daily     Endocrinology:  Diabetes - Sulfonylureas Failed - 07/25/2023  3:55 PM      Failed - HBA1C is between 0 and 7.9 and within 180 days    Hgb A1c MFr Bld  Date Value Ref Range Status  12/25/2022 7.4 (H) 4.8 - 5.6 % Final    Comment:             Prediabetes: 5.7 - 6.4          Diabetes: >6.4          Glycemic control for adults with diabetes: <7.0          Failed - Cr in normal range and within 360 days    Creatinine  Date Value Ref Range Status  03/01/2012 1.72 (H) 0.60 - 1.30 mg/dL Final   Creatinine, Ser  Date Value Ref Range Status  05/28/2023 1.50 (H) 0.57 - 1.00 mg/dL Final   Creatinine, POC  Date Value Ref Range Status  08/02/2017 NA mg/dL Final         Passed - Valid encounter within last 6 months    Recent Outpatient Visits           1 month ago Hospital discharge follow-up   [place] [facility] Simmons-Robinson, [place], MD   2 months ago Hypoglycemia due to type 2 diabetes mellitus (HCC)   [place] [facility] Simmons-Robinson, [place], MD   4 months ago Flu vaccine need   [place] [facility] Simmons-Robinson, [place], MD   7 months ago Stage 3 chronic kidney disease, unspecified whether stage 3a or 3b CKD (HCC)   [place] [facility] Simmons-Robinson, [place], MD   11 months ago Screening for colon cancer    [facility] Simmons-Robinson, [place], MD       Future Appointments             In 5 days Simmons-Robinson, Financial controller, MD [facility], [facility]   In 3 weeks [place], Bernadette Hoit, PA-C [facility] [employer], [facility]   In 1 month Simmons-Robinson, Financial controller, MD [facility], [facility]              metFORMIN (GLUCOPHAGE) 500 MG tablet [employer] Med Name: metFORMIN HCl 500 MG Oral Tablet] 180 tablet     Sig: TAKE 1 TABLET BY MOUTH TWICE DAILY WITH A MEAL     Endocrinology:  Diabetes - Biguanides Failed - 07/25/2023  3:55 PM      Failed - Cr in normal range and within 360 days    Creatinine  Date Value Ref Range Status  03/01/2012 1.72 (H) 0.60 - 1.30 mg/dL Final   Creatinine, Ser  Date Value Ref Range Status  05/28/2023 1.50 (H) 0.57 - 1.00 mg/dL Final   Creatinine, POC  Date Value Ref Range Status  08/02/2017 NA mg/dL Final         Failed - HBA1C is between 0 and 7.9 and within 180 days    Hgb A1c MFr Bld  Date Value Ref Range Status  12/25/2022 7.4 (H) 4.8 - 5.6 % Final    Comment:             Prediabetes: 5.7 - 6.4  Diabetes: >6.4          Glycemic control for adults with diabetes: <7.0          Failed - eGFR in normal range and within 360 days    EGFR (African American)  Date Value Ref Range Status  03/01/2012 36 (L)  Final   GFR calc Af Amer  Date Value Ref Range Status  10/06/2019 41 (L) >59 mL/min/1.73 Final    Comment:    **[facility] currently reports eGFR in compliance with the current**   recommendations of the [employer]. [facility] will   update reporting as new guidelines are published from the NKF-ASN   Task force.    EGFR (Non-African Amer.)  Date Value Ref Range Status  03/01/2012 31 (L)  Final    Comment:    eGFR values <60mL/min/1.73 m2 may be an indication of chronic kidney disease (CKD). Calculated eGFR is useful in patients with stable renal function. The eGFR calculation will not be reliable in acutely ill patients when serum creatinine is changing rapidly. It is not useful in  patients on dialysis. The eGFR calculation may not be applicable to patients at the low and high extremes of body sizes, pregnant women, and vegetarians.    GFR, Estimated  Date Value Ref Range Status  05/21/2023 41 (L) >60 mL/min Final     Comment:    (NOTE) Calculated using the CKD-EPI Creatinine Equation (2021)    eGFR  Date Value Ref Range Status  05/28/2023 36 (L) >59 mL/min/1.73 Final         Failed - B12 Level in normal range and within 720 days    No results found for: "VITAMINB12"       Passed - Valid encounter within last 6 months    Recent Outpatient Visits           1 month ago Hospital discharge follow-up   [place] [facility] Simmons-Robinson, [place], MD   2 months ago Hypoglycemia due to type 2 diabetes mellitus (HCC)   [place] [facility] Simmons-Robinson, [place], MD   4 months ago Flu vaccine need   [place] [facility] Simmons-Robinson, [place], MD   7 months ago Stage 3 chronic kidney disease, unspecified whether stage 3a or 3b CKD (HCC)   [place] [facility] Simmons-Robinson, [place], MD   11 months ago Screening for colon cancer   [place] [facility] Simmons-Robinson, Tawanna Cooler, MD       Future Appointments             In 5 days Simmons-Robinson, Tawanna Cooler, MD [facility], [facility]   In 3 weeks [place], Bernadette Hoit, PA-C  [facility] [employer], [facility]   In 1 month Simmons-Robinson, [place], MD [employer], [facility]            Passed - CBC within normal limits and completed in the last 12 months    WBC  Date Value Ref Range Status  05/28/2023 7.1 3.4 - 10.8 x10E3/uL Final  05/21/2023 4.1 4.0 - 10.5 K/uL Final   RBC  Date Value Ref Range Status  05/28/2023 3.61 (L) 3.77 - 5.28 x10E6/uL Final  05/21/2023 3.23 (L) 3.87 - 5.11 MIL/uL Final   Hemoglobin  Date Value Ref Range Status  05/28/2023 10.7 (L) 11.1 - 15.9 g/dL Final   Hematocrit  Date Value Ref Range Status  05/28/2023 32.7 (L) 34.0 - 46.6 % Final  MCHC  Date Value Ref Range Status  05/28/2023 32.7 31.5 - 35.7 g/dL Final  16/03/9603 54.0 30.0 - 36.0 g/dL Final    [facility]  Date Value Ref Range Status  05/28/2023 29.6 26.6 - 33.0 pg Final  05/21/2023 29.1 26.0 - 34.0 pg Final   MCV  Date Value Ref Range Status  05/28/2023 91 79 - 97 fL Final  03/01/2012 92 80 - 100 fL Final   No results found for: "PLTCOUNTKUC", "LABPLAT", "POCPLA" RDW  Date Value Ref Range Status  05/28/2023 15.2 11.7 - 15.4 % Final  03/01/2012 14.9 (H) 11.5 - 14.5 % Final

## 2023-07-30 ENCOUNTER — Encounter: Payer: Self-pay | Admitting: Family Medicine

## 2023-07-30 ENCOUNTER — Ambulatory Visit (INDEPENDENT_AMBULATORY_CARE_PROVIDER_SITE_OTHER): Payer: HMO | Admitting: Family Medicine

## 2023-07-30 ENCOUNTER — Other Ambulatory Visit (HOSPITAL_COMMUNITY)
Admission: RE | Admit: 2023-07-30 | Discharge: 2023-07-30 | Disposition: A | Discharge status: Home / Self Care | Payer: HMO | Source: Ambulatory Visit | Attending: Family Medicine | Admitting: Family Medicine

## 2023-07-30 VITALS — BP 108/54 | HR 69 | Resp 18 | Ht 61.0 in | Wt 155.3 lb

## 2023-07-30 DIAGNOSIS — Z7984 Long term (current) use of oral hypoglycemic drugs: Secondary | ICD-10-CM | POA: Diagnosis not present

## 2023-07-30 DIAGNOSIS — Z1151 Encounter for screening for human papillomavirus (HPV): Secondary | ICD-10-CM | POA: Insufficient documentation

## 2023-07-30 DIAGNOSIS — Z01419 Encounter for gynecological examination (general) (routine) without abnormal findings: Secondary | ICD-10-CM | POA: Diagnosis not present

## 2023-07-30 DIAGNOSIS — K219 Gastro-esophageal reflux disease without esophagitis: Secondary | ICD-10-CM | POA: Diagnosis not present

## 2023-07-30 DIAGNOSIS — E785 Hyperlipidemia, unspecified: Secondary | ICD-10-CM

## 2023-07-30 DIAGNOSIS — Z124 Encounter for screening for malignant neoplasm of cervix: Secondary | ICD-10-CM | POA: Insufficient documentation

## 2023-07-30 DIAGNOSIS — E1169 Type 2 diabetes mellitus with other specified complication: Secondary | ICD-10-CM | POA: Diagnosis not present

## 2023-07-30 DIAGNOSIS — N939 Abnormal uterine and vaginal bleeding, unspecified: Secondary | ICD-10-CM

## 2023-07-30 DIAGNOSIS — I10 Essential (primary) hypertension: Secondary | ICD-10-CM

## 2023-07-30 DIAGNOSIS — I2510 Atherosclerotic heart disease of native coronary artery without angina pectoris: Secondary | ICD-10-CM | POA: Diagnosis not present

## 2023-07-30 NOTE — Assessment & Plan Note (Signed)
 Intermittent vaginal bleeding for the past 2-3 months, sometimes heavy. No bleeding observed during the physical exam, but a small amount of discharge noted. Differential diagnosis includes endometrial hyperplasia, polyps, or malignancy. Discussed the need for a transvaginal ultrasound to evaluate endometrial thickness. - Perform Pap smear - Order transvaginal ultrasound - Schedule ultrasound

## 2023-07-30 NOTE — Progress Notes (Signed)
 Established patient visit   [name]   DOB: 1951-02-11   73 y.o. Female  MRN: [id number] Visit Date: 07/30/2023  Today's healthcare provider: [name], MD   Chief Complaint  Patient presents with   Hypertension   Gynecologic Exam   Subjective       Discussed the use of AI scribe software for clinical note transcription with the patient, who gave verbal consent to proceed.  History of Present Illness   Cynthia Sims is a 73 year old female who presents with vaginal bleeding.  She has been experiencing intermittent vaginal bleeding over the past two to three months. The bleeding is sometimes heavy, resembling a menstrual period, but it does not occur daily. She also reports a little itching and burning associated with the bleeding.  Her hypertension is managed with carvedilol 25 mg twice daily and irbesartan 75 mg once daily. She has a history of coronary artery disease and peripheral arterial disease, for which she takes Imdur 30 mg once daily and nitroglycerin 0.4 mg as needed.  She has diabetes, managed with Jardiance 10 mg daily, metformin 500 mg twice daily, semaglutide 0.5 mg weekly, and Basaglar 24 units once daily. Her last A1c was 6.5% in January 2025.  She is on lovastatin 40 mg once daily for hyperlipidemia and Protonix 40 mg daily for gastric reflux.  She is undergoing routine cervical cancer screening with a Pap smear as part of her general health maintenance.         Past Medical History:  Diagnosis Date   ALT (SGPT) level raised    Anemia    Anxiety    Arthritis    Depression    Diabetes mellitus without complication (HCC)    Elevated cholesterol    GERD (gastroesophageal reflux disease)    Hemorrhoids    Hypertension    Kidney disease     Medications: Outpatient Medications Prior to Visit  Medication Sig   acetaminophen (TYLENOL) 500 MG tablet Take 500 mg by mouth every 6 (six) hours as needed.    aspirin EC  81 MG tablet Take 81 mg by mouth daily.    carvedilol (COREG) 25 MG tablet Take 1 tablet by mouth twice daily   cholestyramine (QUESTRAN) 4 g packet DISSOLVE AND TAKE ONE PACKET BY MOUTH THREE TIMES DAILY   clopidogrel (PLAVIX) 75 MG tablet Take 1 tablet by mouth once daily   cyanocobalamin 1000 MCG tablet Take 1,000 mcg by mouth daily.   DULoxetine (CYMBALTA) 60 MG capsule Take 1 capsule by mouth once daily   gabapentin (NEURONTIN) 100 MG capsule TAKE 1 CAPSULE BY MOUTH THREE TIMES DAILY   GVOKE HYPOPEN 2-PACK 1 MG/0.2ML SOAJ Inject 1 mg as directed as directed.  INJECT 1 PEN IN CASE OF SEVERE HYPOGLYCEMIA   Insulin Glargine (BASAGLAR KWIKPEN) 100 UNIT/ML INJECT 24 UNITS SUBCUTANEOUSLY ONCE DAILY   irbesartan (AVAPRO) 75 MG tablet Take 1 tablet (75 mg total) by mouth daily.   isosorbide mononitrate (IMDUR) 30 MG 24 hr tablet Take 1 tablet by mouth once daily   JARDIANCE 10 MG TABS tablet Take 10 mg by mouth every morning.   lovastatin (MEVACOR) 40 MG tablet Take 1 tablet by mouth once daily   metFORMIN (GLUCOPHAGE) 500 MG tablet Take 500 mg by mouth 2 (two) times daily.   nitroGLYCERIN (NITROSTAT) 0.4 MG SL tablet Place 1 tablet (0.4 mg total) under the tongue every 5 (five) minutes as needed for chest pain.  ondansetron (ZOFRAN-ODT) 4 MG disintegrating tablet Take 1 tablet (4 mg total) by mouth every 8 (eight) hours as needed for nausea or vomiting.   pantoprazole (PROTONIX) 40 MG tablet Take 1 tablet (40 mg total) by mouth daily.   Semaglutide,0.25 or 0.5MG /DOS, 2 MG/3ML SOPN Inject into the skin.   trimethoprim (TRIMPEX) 100 MG tablet Take 1 tablet (100 mg total) by mouth daily.   No facility-administered medications prior to visit.    Review of Systems  Last CBC Lab Results  Component Value Date   WBC 7.1 05/28/2023   HGB 10.7 (L) 05/28/2023   HCT 32.7 (L) 05/28/2023   MCV 91 05/28/2023   MCH 29.6 05/28/2023   RDW 15.2 05/28/2023   PLT 199 05/28/2023   Last metabolic  panel Lab Results  Component Value Date   GLUCOSE 68 (L) 05/28/2023   NA 144 05/28/2023   K 3.8 05/28/2023   CL 110 (H) 05/28/2023   CO2 15 (L) 05/28/2023   BUN 16 05/28/2023   CREATININE 1.50 (H) 05/28/2023   EGFR 36 (L) 05/28/2023   CALCIUM 7.8 (L) 05/28/2023   PROT 5.3 (L) 05/18/2023   ALBUMIN 2.5 (L) 05/18/2023   LABGLOB 2.6 06/09/2022   AGRATIO 1.6 06/09/2022   BILITOT 0.5 05/18/2023   ALKPHOS 54 05/18/2023   AST 18 05/18/2023   ALT 12 05/18/2023   ANIONGAP 4 (L) 05/21/2023   Last lipids Lab Results  Component Value Date   CHOL 131 12/25/2022   HDL 58 12/25/2022   LDLCALC 45 12/25/2022   TRIG 168 (H) 12/25/2022   CHOLHDL 2.3 12/25/2022   Last hemoglobin A1c Lab Results  Component Value Date   HGBA1C 7.4 (H) 12/25/2022   Last thyroid functions Lab Results  Component Value Date   TSH 2.080 09/22/2021        Objective    BP (!) 108/54   Pulse 69   Resp 18   Ht 5\' 1"  (1.549 m)   Wt 155 lb 4.8 oz (70.4 kg)   SpO2 97%   BMI 29.34 kg/m  BP Readings from Last 3 Encounters:  07/30/23 (!) 108/54  05/28/23 (!) 154/64  05/21/23 (!) 152/74   Wt Readings from Last 3 Encounters:  07/30/23 155 lb 4.8 oz (70.4 kg)  05/28/23 173 lb 14.4 oz (78.9 kg)  05/17/23 169 lb 12.1 oz (77 kg)        Physical Exam Exam conducted with a chaperone present.  Constitutional:      General: She is not in acute distress.    Appearance: Normal appearance. She is not ill-appearing, toxic-appearing or diaphoretic.     Comments: Elderly female walking with four prong cane  Genitourinary:    General: Normal vulva.     Pubic Area: No rash or pubic lice.      Labia:        Right: No rash, tenderness, lesion or injury.        Left: No rash, tenderness, lesion or injury.      Urethra: No urethral pain, urethral swelling or urethral lesion.     Vagina: No vaginal discharge, erythema, tenderness, bleeding or lesions.     Cervix: Normal.     Uterus: Normal.      Adnexa: Right  adnexa normal and left adnexa normal.     Rectum: Normal.       No results found for any visits on 07/30/23.  Assessment & Plan     Problem List Items Addressed This Visit  Cardiovascular and Mediastinum   Essential hypertension   Chronic hypertension managed with carvedilol 25 mg twice daily and irbesartan 75 mg once daily. Current blood pressure is 108/54 mmHg, which is on the lower side but not critically low. Patient prefers not to adjust medication to avoid the inconvenience of splitting pills. - Continue carvedilol 25 mg twice daily - Continue irbesartan 75 mg once daily - Monitor blood pressure and follow up with cardiology        Genitourinary   Abnormal uterine bleeding - Primary   Intermittent vaginal bleeding for the past 2-3 months, sometimes heavy. No bleeding observed during the physical exam, but a small amount of discharge noted. Differential diagnosis includes endometrial hyperplasia, polyps, or malignancy. Discussed the need for a transvaginal ultrasound to evaluate endometrial thickness. - Perform Pap smear - Order transvaginal ultrasound - Schedule ultrasound      Relevant Orders   US Pelvic Complete With Transvaginal   Other Visit Diagnoses       Screening for cervical cancer       Relevant Orders   Cytology - PAP             Coronary Artery Disease (CAD) and Peripheral Arterial Disease (PAD) CAD and PAD managed with Imdur 30 mg once daily and nitroglycerin 0.4 mg as needed. No acute issues discussed during this visit. - Continue Imdur 30 mg once daily - Continue nitroglycerin 0.4 mg as needed  Type 2 Diabetes Mellitus Type 2 diabetes managed with Jardiance 10 mg daily, metformin 500 mg twice daily, semaglutide 0.5 mg weekly, and Basaglar 24 units once daily. Last A1c was 6.5% in January. Endocrinologist recommended continuing current medications and stationary bike exercise. - Continue Jardiance 10 mg daily - Continue metformin 500 mg  twice daily - Continue semaglutide 0.5 mg weekly - Continue Basaglar 24 units once daily - Follow up with endocrinology as scheduled  Hyperlipidemia Hyperlipidemia managed with lovastatin 40 mg once daily. No acute issues discussed during this visit. - Continue lovastatin 40 mg once daily  Gastroesophageal Reflux Disease (GERD) GERD managed with Protonix 40 mg daily. No acute issues discussed during this visit. - Continue Protonix 40 mg daily  Pap smear completed per patient request    No follow-ups on file.         [name], MD  [facility] [phone] (phone) [phone] (fax)  [facility]

## 2023-07-30 NOTE — Patient Instructions (Addendum)
 VISIT SUMMARY:  Today, you were seen for intermittent vaginal bleeding that has been occurring over the past two to three months. We discussed your current medications and health conditions, including hypertension, coronary artery disease, peripheral arterial disease, type 2 diabetes, hyperlipidemia, and gastroesophageal reflux disease. A physical exam was performed, and we have planned further evaluations to determine the cause of your symptoms.  YOUR PLAN:  -POSTMENOPAUSAL BLEEDING: Postmenopausal bleeding is any vaginal bleeding that occurs after menopause. It can be caused by various conditions such as endometrial hyperplasia, polyps, or malignancy. We will perform a Pap smear and schedule a transvaginal ultrasound to evaluate the thickness of your endometrium.  -HYPERTENSION: Hypertension is high blood pressure. Your blood pressure is currently well-managed with your medications, carvedilol and irbesartan. We will continue your current medications and monitor your blood pressure, with follow-up appointments with cardiology.  -CORONARY ARTERY DISEASE (CAD) AND PERIPHERAL ARTERIAL DISEASE (PAD): CAD and PAD are conditions where the blood vessels supplying the heart and other parts of the body are narrowed or blocked. Your conditions are managed with Imdur and nitroglycerin. We will continue your current medications.  -GENERAL HEALTH MAINTENANCE: We performed a routine cervical cancer screening with a Pap smear today.  INSTRUCTIONS:  Please await the results of your Pap smear in 2-3 days. Schedule and perform the transvaginal ultrasound as discussed. Follow up with cardiology for hypertension management and with endocrinology for diabetes management.

## 2023-07-30 NOTE — Assessment & Plan Note (Signed)
 Chronic hypertension managed with carvedilol 25 mg twice daily and irbesartan 75 mg once daily. Current blood pressure is 108/54 mmHg, which is on the lower side but not critically low. Patient prefers not to adjust medication to avoid the inconvenience of splitting pills. - Continue carvedilol 25 mg twice daily - Continue irbesartan 75 mg once daily - Monitor blood pressure and follow up with cardiology

## 2023-07-31 ENCOUNTER — Other Ambulatory Visit: Payer: Self-pay | Admitting: Family Medicine

## 2023-07-31 DIAGNOSIS — E1142 Type 2 diabetes mellitus with diabetic polyneuropathy: Secondary | ICD-10-CM | POA: Diagnosis not present

## 2023-07-31 DIAGNOSIS — E0842 Diabetes mellitus due to underlying condition with diabetic polyneuropathy: Secondary | ICD-10-CM

## 2023-07-31 NOTE — Telephone Encounter (Signed)
 Requested medication (s) are due for refill today: yes  Requested medication (s) are on the active medication list: yes  Last refill:  05/28/23 #53ml/0  Future visit scheduled: yes  Notes to clinic:  Unable to refill per protocol due to failed labs, no updated results.      Requested Prescriptions  Pending Prescriptions Disp Refills   Insulin Glargine (BASAGLAR KWIKPEN) 100 UNIT/ML [Pharmacy Med Name: Basaglar KwikPen 100 UNIT/ML Subcutaneous Solution Pen-injector] 15 mL 0    Sig: INJECT 24 UNITS SUBCUTANEOUSLY ONCE DAILY     Endocrinology:  Diabetes - Insulins Failed - 07/31/2023  5:22 PM      Failed - HBA1C is between 0 and 7.9 and within 180 days    Hgb A1c MFr Bld  Date Value Ref Range Status  12/25/2022 7.4 (H) 4.8 - 5.6 % Final    Comment:             Prediabetes: 5.7 - 6.4          Diabetes: >6.4          Glycemic control for adults with diabetes: <7.0          Passed - Valid encounter within last 6 months    Recent Outpatient Visits           2 months ago Hospital discharge follow-up   [place] [facility] Simmons-Robinson, [place], MD   3 months ago Hypoglycemia due to type 2 diabetes mellitus (HCC)   [place] [facility] Simmons-Robinson, [place], MD   4 months ago Flu vaccine need   [place] [facility] Simmons-Robinson, [place], MD   7 months ago Stage 3 chronic kidney disease, unspecified whether stage 3a or 3b CKD (HCC)   [place] [facility] [facility] Simmons-Robinson, [place], MD   11 months ago Screening for colon cancer   [place] [facility] Simmons-Robinson, Tawanna Cooler, MD       Future Appointments             In 2 weeks Asa Lente, Bernadette Hoit, PA-C [facility]   In 3 weeks Simmons-Robinson, Financial controller, MD [facility]

## 2023-08-01 ENCOUNTER — Encounter: Payer: Self-pay | Admitting: Family Medicine

## 2023-08-01 LAB — CYTOLOGY - PAP
Adequacy: ABSENT
Chlamydia: NEGATIVE
Comment: NEGATIVE
Comment: NEGATIVE
Comment: NEGATIVE
Comment: NORMAL
Diagnosis: NEGATIVE
High risk HPV: NEGATIVE
Neisseria Gonorrhea: NEGATIVE
Trichomonas: NEGATIVE

## 2023-08-08 ENCOUNTER — Ambulatory Visit
Admission: RE | Admit: 2023-08-08 | Discharge: 2023-08-08 | Disposition: A | Discharge status: Home / Self Care | Payer: HMO | Source: Ambulatory Visit | Attending: Family Medicine | Admitting: Family Medicine

## 2023-08-08 DIAGNOSIS — N939 Abnormal uterine and vaginal bleeding, unspecified: Secondary | ICD-10-CM | POA: Insufficient documentation

## 2023-08-08 DIAGNOSIS — Z780 Asymptomatic menopausal state: Secondary | ICD-10-CM | POA: Diagnosis not present

## 2023-08-08 DIAGNOSIS — R9389 Abnormal findings on diagnostic imaging of other specified body structures: Secondary | ICD-10-CM | POA: Diagnosis not present

## 2023-08-09 DIAGNOSIS — E663 Overweight: Secondary | ICD-10-CM | POA: Diagnosis not present

## 2023-08-09 DIAGNOSIS — K589 Irritable bowel syndrome without diarrhea: Secondary | ICD-10-CM | POA: Diagnosis not present

## 2023-08-09 DIAGNOSIS — E1142 Type 2 diabetes mellitus with diabetic polyneuropathy: Secondary | ICD-10-CM | POA: Diagnosis not present

## 2023-08-09 DIAGNOSIS — Z794 Long term (current) use of insulin: Secondary | ICD-10-CM | POA: Diagnosis not present

## 2023-08-09 DIAGNOSIS — F3342 Major depressive disorder, recurrent, in full remission: Secondary | ICD-10-CM | POA: Diagnosis not present

## 2023-08-09 DIAGNOSIS — E785 Hyperlipidemia, unspecified: Secondary | ICD-10-CM | POA: Diagnosis not present

## 2023-08-09 DIAGNOSIS — K219 Gastro-esophageal reflux disease without esophagitis: Secondary | ICD-10-CM | POA: Diagnosis not present

## 2023-08-09 DIAGNOSIS — E1151 Type 2 diabetes mellitus with diabetic peripheral angiopathy without gangrene: Secondary | ICD-10-CM | POA: Diagnosis not present

## 2023-08-09 DIAGNOSIS — N319 Neuromuscular dysfunction of bladder, unspecified: Secondary | ICD-10-CM | POA: Diagnosis not present

## 2023-08-09 DIAGNOSIS — I209 Angina pectoris, unspecified: Secondary | ICD-10-CM | POA: Diagnosis not present

## 2023-08-09 DIAGNOSIS — E1169 Type 2 diabetes mellitus with other specified complication: Secondary | ICD-10-CM | POA: Diagnosis not present

## 2023-08-09 DIAGNOSIS — I10 Essential (primary) hypertension: Secondary | ICD-10-CM | POA: Diagnosis not present

## 2023-08-14 ENCOUNTER — Other Ambulatory Visit: Payer: Self-pay | Admitting: Family Medicine

## 2023-08-14 DIAGNOSIS — F411 Generalized anxiety disorder: Secondary | ICD-10-CM

## 2023-08-14 DIAGNOSIS — E0842 Diabetes mellitus due to underlying condition with diabetic polyneuropathy: Secondary | ICD-10-CM

## 2023-08-15 NOTE — Telephone Encounter (Signed)
 Requested Prescriptions  Pending Prescriptions Disp Refills   DULoxetine (CYMBALTA) 60 MG capsule [Pharmacy Med Name: DULoxetine HCl 60 MG Oral Capsule Delayed Release Particles] 90 capsule 0    Sig: Take 1 capsule by mouth once daily     Psychiatry: Antidepressants - SNRI - duloxetine Failed - 08/15/2023 10:36 AM      Failed - Cr in normal range and within 360 days    Creatinine  Date Value Ref Range Status  03/01/2012 1.72 (H) 0.60 - 1.30 mg/dL Final   Creatinine, Ser  Date Value Ref Range Status  05/28/2023 1.50 (H) 0.57 - 1.00 mg/dL Final   Creatinine, POC  Date Value Ref Range Status  08/02/2017 NA mg/dL Final         Passed - eGFR is 30 or above and within 360 days    EGFR (African American)  Date Value Ref Range Status  03/01/2012 36 (L)  Final   GFR calc Af Amer  Date Value Ref Range Status  10/06/2019 41 (L) >59 mL/min/1.73 Final    Comment:    **[facility] currently reports eGFR in compliance with the current**   recommendations of the [employer]. [facility] will   update reporting as new guidelines are published from the NKF-ASN   Task force.    EGFR (Non-African Amer.)  Date Value Ref Range Status  03/01/2012 31 (L)  Final    Comment:    eGFR values <44mL/min/1.73 m2 may be an indication of chronic kidney disease (CKD). Calculated eGFR is useful in patients with stable renal function. The eGFR calculation will not be reliable in acutely ill patients when serum creatinine is changing rapidly. It is not useful in  patients on dialysis. The eGFR calculation may not be applicable to patients at the low and high extremes of body sizes, pregnant women, and vegetarians.    GFR, Estimated  Date Value Ref Range Status  05/21/2023 41 (L) >60 mL/min Final    Comment:    (NOTE) Calculated using the CKD-EPI Creatinine Equation (2021)    eGFR  Date Value Ref Range Status  05/28/2023 36 (L) >59 mL/min/1.73 Final         Passed - Completed PHQ-2 or  PHQ-9 in the last 360 days      Passed - Last BP in normal range    BP Readings from Last 1 Encounters:  07/30/23 (!) 108/54         Passed - Valid encounter within last 6 months    Recent Outpatient Visits           2 months ago Hospital discharge follow-up   [facility] Simmons-Robinson, [place], MD   3 months ago Hypoglycemia due to type 2 diabetes mellitus (HCC)   [place] [facility] Simmons-Robinson, [place], MD   5 months ago Flu vaccine need   [place] [facility] Simmons-Robinson, [place], MD   7 months ago Stage 3 chronic kidney disease, unspecified whether stage 3a or 3b CKD (HCC)   [place] [facility] Simmons-Robinson, [place], MD   11 months ago Screening for colon cancer   [place] [facility] Simmons-Robinson, Tawanna Cooler, MD       Future Appointments             In 2 days Sharlene Dory, PA-C [facility]   In 1 week Simmons-Robinson, Tawanna Cooler, MD [facility]

## 2023-08-17 ENCOUNTER — Telehealth: Payer: Self-pay | Admitting: Internal Medicine

## 2023-08-17 ENCOUNTER — Ambulatory Visit: Payer: HMO | Attending: Physician Assistant | Admitting: Physician Assistant

## 2023-08-17 ENCOUNTER — Encounter: Payer: Self-pay | Admitting: Physician Assistant

## 2023-08-17 VITALS — BP 100/56 | HR 60 | Ht 61.0 in | Wt 150.4 lb

## 2023-08-17 DIAGNOSIS — R0602 Shortness of breath: Secondary | ICD-10-CM

## 2023-08-17 DIAGNOSIS — E785 Hyperlipidemia, unspecified: Secondary | ICD-10-CM

## 2023-08-17 DIAGNOSIS — I10 Essential (primary) hypertension: Secondary | ICD-10-CM

## 2023-08-17 DIAGNOSIS — E1169 Type 2 diabetes mellitus with other specified complication: Secondary | ICD-10-CM

## 2023-08-17 DIAGNOSIS — I2510 Atherosclerotic heart disease of native coronary artery without angina pectoris: Secondary | ICD-10-CM | POA: Diagnosis not present

## 2023-08-17 MED ORDER — CARVEDILOL 25 MG PO TABS: 12.5000 mg | ORAL_TABLET | Freq: Two times a day (BID) | ORAL | 2 refills | Status: DC

## 2023-08-17 NOTE — Progress Notes (Signed)
 Office Visit    Patient Name: Cynthia Sims Date of Encounter: 08/17/2023  PCP:  [name], MD   [facility]  Cardiologist:  Christell Constant, MD  Advanced Practice Provider:  No care team member to display Electrophysiologist:  None   HPI    Cynthia Sims is a 73 y.o. female with a past medical history of hypertension, diabetes mellitus, OSA, CKD stage IIIa, HLD, morbid obesity, aortic atherosclerosis presents today for follow-up appointment.  She was last seen 07/13/2022 by Dr. Izora Ribas and at that time she was feeling some chest discomfort for the last few months.  She felt it when she was cleaning and she had to sit down.  She felt weak and gets chest discomfort and shortness of breath.  Chest pain was predominantly with exertion.  She notes shortness of breath.  No PND/orthopnea.  Some leg swelling and gaining weight.  No syncope or near syncope.  No palpitations.  No prior cardiac testing.  As needed nitroglycerin was added and plan for coronary CTA.  Coronary calcium score was 398 with 86 percentile for age and sex matched control.  Moderate proximal LAD stenosis (50%), mild proximal LCx stenosis (25 to 49%), additional analysis with CT FFR submitted.  FFR was not performed due to motion related artifact.  Plan for continued antianginal therapy and aspirin/statin.  I saw her April 2024, she states that her blood pressure has been elevated at home And she is taking it however in times of the day.  Has been as high as 1 80-200 systolic.  Blood pressure today in the clinic was 134/70.  She is on Avapro and carvedilol and compliant with medication.  We discussed her coronary CT scan with her high calcium score and moderate stenosis of her LAD.  I think we can reasonably add Imdur today to help with both her blood pressure and anginal symptoms.  We also need to work on lowering LDL (she is due for a lipid check through her  primary).  Reports no shortness of breath nor dyspnea on exertion. Reports no chest pain, pressure, or tightness. No edema, orthopnea, PND. Reports no palpitations.   Today, she presents with a history of hypertension with low blood pressure and lightheadedness. She reports a significant weight loss of 45 pounds since September, attributed to Ozempic and dietary changes. She has been maintaining hydration and eating small snacks throughout the day to prevent hypoglycemia. She was recently hospitalized for dehydration and a severe kidney infection, which led to discontinuation of Jardiance due to UTI risk. She also reports shortness of breath and fatigue with exertion, requiring frequent breaks during activities. This has been a long-standing issue. She denies any recent chest pain. She also reports feeling 'drunk' and needing to use a cane, which may be related to her low blood pressure.   Discussed the use of AI scribe software for clinical note transcription with the patient, who gave verbal consent to proceed.  Reports no shortness of breath nor dyspnea on exertion. Reports no chest pain, pressure, or tightness. No edema, orthopnea, PND. Reports no palpitations.   Past Medical History    Past Medical History:  Diagnosis Date   ALT (SGPT) level raised    Anemia    Anxiety    Arthritis    Depression    Diabetes mellitus without complication (HCC)    Elevated cholesterol    GERD (gastroesophageal reflux disease)    Hemorrhoids    Hypertension  Kidney disease    Past Surgical History:  Procedure Laterality Date   abdominal blockage     CESAREAN SECTION  1983   CESAREAN SECTION     CHOLECYSTECTOMY     COLONOSCOPY WITH PROPOFOL N/A 10/29/2015   Procedure: COLONOSCOPY WITH PROPOFOL;  Surgeon: Scot Jun, MD;  Location: [facility] ENDOSCOPY;  Service: Endoscopy;  Laterality: N/A;   COLONOSCOPY WITH PROPOFOL N/A 11/07/2017   Procedure: COLONOSCOPY WITH PROPOFOL;  Surgeon: Toledo, Boykin Nearing, MD;  Location: [facility] ENDOSCOPY;  Service: Gastroenterology;  Laterality: N/A;   DILATION AND CURETTAGE OF UTERUS  2001   ESOPHAGOGASTRODUODENOSCOPY (EGD) WITH PROPOFOL N/A 10/29/2015   Procedure: ESOPHAGOGASTRODUODENOSCOPY (EGD) WITH PROPOFOL;  Surgeon: Scot Jun, MD;  Location: [facility] ENDOSCOPY;  Service: Endoscopy;  Laterality: N/A;   LOWER EXTREMITY ANGIOGRAPHY Right 10/24/2022   Procedure: Lower Extremity Angiography;  Surgeon: Renford Dills, MD;  Location: [facility] INVASIVE CV LAB;  Service: Cardiovascular;  Laterality: Right;   LOWER EXTREMITY ANGIOGRAPHY Left 10/31/2022   Procedure: Lower Extremity Angiography;  Surgeon: Renford Dills, MD;  Location: [facility] INVASIVE CV LAB;  Service: Cardiovascular;  Laterality: Left;    Allergies  No Known Allergies   EKGs/Labs/Other Studies Reviewed:   The following studies were reviewed today:  IMPRESSION: 1. Coronary calcium score of 398. This was 86th percentile for age and sex matched control.   2. Normal coronary origin with right dominance.   3. Moderate proximal LAD stenosis (50%).   4. Mild proximal LCx stenosis (25-49%).   5. CAD-RADS 3. Moderate stenosis. Consider symptom-guided anti-ischemic pharmacotherapy as well as risk factor modification per guideline directed care.   6. Additional analysis with CT FFR will be submitted.   Electronically Signed: By: Debbe Odea M.D. On: 08/07/2022 14:18  EKG:  EKG is not ordered today.   Recent Labs: 01/30/2023: NT-Pro BNP 395 05/18/2023: ALT 12 05/28/2023: BUN 16; Creatinine, Ser 1.50; Hemoglobin 10.7; Platelets 199; Potassium 3.8; Sodium 144  Recent Lipid Panel    Component Value Date/Time   CHOL 131 12/25/2022 0925   TRIG 168 (H) 12/25/2022 0925   HDL 58 12/25/2022 0925   CHOLHDL 2.3 12/25/2022 0925   LDLCALC 45 12/25/2022 0925   Home Medications   Current Meds  Medication Sig   acetaminophen (TYLENOL) 500 MG tablet Take 500 mg by mouth every 6 (six)  hours as needed.    aspirin EC 81 MG tablet Take 81 mg by mouth daily.    cholestyramine (QUESTRAN) 4 g packet DISSOLVE AND TAKE ONE PACKET BY MOUTH THREE TIMES DAILY   clopidogrel (PLAVIX) 75 MG tablet Take 1 tablet by mouth once daily   cyanocobalamin 1000 MCG tablet Take 1,000 mcg by mouth daily.   DULoxetine (CYMBALTA) 60 MG capsule Take 1 capsule by mouth once daily   gabapentin (NEURONTIN) 100 MG capsule TAKE 1 CAPSULE BY MOUTH THREE TIMES DAILY   GVOKE HYPOPEN 2-PACK 1 MG/0.2ML SOAJ Inject 1 mg as directed as directed.  INJECT 1 PEN IN CASE OF SEVERE HYPOGLYCEMIA   Insulin Glargine (BASAGLAR KWIKPEN) 100 UNIT/ML INJECT 24 UNITS SUBCUTANEOUSLY ONCE DAILY   irbesartan (AVAPRO) 75 MG tablet Take 1 tablet (75 mg total) by mouth daily.   isosorbide mononitrate (IMDUR) 30 MG 24 hr tablet Take 1 tablet by mouth once daily   lovastatin (MEVACOR) 40 MG tablet Take 1 tablet by mouth once daily   metFORMIN (GLUCOPHAGE) 500 MG tablet TAKE 1 TABLET BY MOUTH TWICE DAILY WITH A MEAL   nitroGLYCERIN (  NITROSTAT) 0.4 MG SL tablet Place 1 tablet (0.4 mg total) under the tongue every 5 (five) minutes as needed for chest pain.   ondansetron (ZOFRAN-ODT) 4 MG disintegrating tablet Take 1 tablet (4 mg total) by mouth every 8 (eight) hours as needed for nausea or vomiting.   pantoprazole (PROTONIX) 40 MG tablet Take 1 tablet (40 mg total) by mouth daily.   Semaglutide,0.25 or 0.5MG /DOS, 2 MG/3ML SOPN Inject into the skin.   trimethoprim (TRIMPEX) 100 MG tablet Take 1 tablet (100 mg total) by mouth daily.   [DISCONTINUED] carvedilol (COREG) 25 MG tablet Take 1 tablet by mouth twice daily   [DISCONTINUED] JARDIANCE 10 MG TABS tablet Take 10 mg by mouth every morning.     Review of Systems      All other systems reviewed and are otherwise negative except as noted above.  Physical Exam    VS:  BP (!) 100/56   Pulse 60   Ht 5\' 1"  (1.549 m)   Wt 150 lb 6.4 oz (68.2 kg)   SpO2 97%   BMI 28.42 kg/m  , BMI  Body mass index is 28.42 kg/m.  Wt Readings from Last 3 Encounters:  08/17/23 150 lb 6.4 oz (68.2 kg)  07/30/23 155 lb 4.8 oz (70.4 kg)  05/28/23 173 lb 14.4 oz (78.9 kg)     GEN: Well nourished, well developed, in no acute distress. HEENT: normal. Neck: Supple, no JVD, carotid bruits, or masses. Cardiac: RRR, no murmurs, rubs, or gallops. No clubbing, cyanosis, edema.  Radials/PT 2+ and equal bilaterally.  Respiratory:  Respirations regular and unlabored, clear to auscultation bilaterally. GI: Soft, nontender, nondistended. MS: No deformity or atrophy. Skin: Warm and dry, no rash. Neuro:  Strength and sensation are intact. Psych: Normal affect.  Assessment & Plan   Hypotension Low blood pressure with systolic readings as low as 70 mmHg and diastolic in the 50s, likely due to recent weight loss and current medications. - Discontinue irbesartan (Avapro). - Reduce carvedilol to 12.5 mg BID. - Monitor blood pressure at home, report if low or spikes. - Target systolic BP 110-145 mmHg.  Heart rate management Heart rate slightly low at 60 bpm, reduction in carvedilol intended to address this. - Reduce carvedilol to 12.5 mg BID.  Shortness of breath Shortness of breath during activities, likely related to cardiovascular status and medication regimen.  Medication management Adjustments to blood pressure medications to manage symptoms and accommodate weight loss. - Discontinue irbesartan (Avapro). - Reduce carvedilol to 12.5 mg BID. - Continue Imdur as prescribed.  Hydration and nutrition Focus on hydration and nutrition to prevent dehydration and stabilize blood sugar, significant weight loss due to Ozempic and dietary changes. - Encourage adequate hydration. - Maintain small, frequent meals to stabilize blood sugar levels.        Disposition: Follow up 6 months with Christell Constant, MD or APP.  Signed, Sharlene Dory, PA-C 08/17/2023, 4:44 PM [facility]

## 2023-08-17 NOTE — Patient Instructions (Signed)
 Medication Instructions:   START TAKING : COREG 12.5 MG TWICE A DAY   *If you need a refill on your cardiac medications before your next appointment, please call your pharmacy*   Lab Work:  NONE ORDERED  TODAY   If you have labs (blood work) drawn today and your tests are completely normal, you will receive your results only by: MyChart Message (if you have MyChart) OR A paper copy in the mail If you have any lab test that is abnormal or we need to change your treatment, we will call you to review the results.   Testing/Procedures: NONE ORDERED  TODAY     Follow-Up: At [facility], you and your health needs are our priority.  As part of our continuing mission to provide you with exceptional heart care, we have created designated Provider Care Teams.  These Care Teams include your primary Cardiologist (physician) and Advanced Practice Providers (APPs -  Physician Assistants and Nurse Practitioners) who all work together to provide you with the care you need, when you need it.  We recommend signing up for the patient portal called "MyChart".  Sign up information is provided on this After Visit Summary.  MyChart is used to connect with patients for Virtual Visits (Telemedicine).  Patients are able to view lab/test results, encounter notes, upcoming appointments, etc.  Non-urgent messages can be sent to your provider as well.   To learn more about what you can do with MyChart, go to [email or web address].     Your next appointment:    6 month(s)   Provider:    Christell Constant, MD     Other Instructions

## 2023-08-17 NOTE — Telephone Encounter (Signed)
 Pt c/o medication issue:  1. Name of Medication:   2. How are you currently taking this medication (dosage and times per day)?   3. Are you having a reaction (difficulty breathing--STAT)?   4. What is your medication issue?   Daughter in law states patient was advised to stop one of her BP medications, but she doesn't know which one. She says it isn't listed on AVS. Please advise.

## 2023-08-20 ENCOUNTER — Telehealth: Payer: Self-pay | Admitting: *Deleted

## 2023-08-20 NOTE — Addendum Note (Signed)
 Addended by: Oleta Mouse on: 08/20/2023 08:02 AM   Modules accepted: Orders

## 2023-08-20 NOTE — Telephone Encounter (Signed)
 Spoke with Dtr n Law aware of medication to stop irbesartan (Avapro). Dtr n Law mention patient had already stopped it was making sure correct one. Also aware of Mychart message, but hadn't got to it yet.Thanked for calling back promptly.

## 2023-08-21 ENCOUNTER — Other Ambulatory Visit (INDEPENDENT_AMBULATORY_CARE_PROVIDER_SITE_OTHER): Payer: Self-pay | Admitting: Vascular Surgery

## 2023-08-21 DIAGNOSIS — I70213 Atherosclerosis of native arteries of extremities with intermittent claudication, bilateral legs: Secondary | ICD-10-CM

## 2023-08-22 ENCOUNTER — Ambulatory Visit: Payer: Self-pay | Admitting: Family Medicine

## 2023-08-22 DIAGNOSIS — G4733 Obstructive sleep apnea (adult) (pediatric): Secondary | ICD-10-CM | POA: Diagnosis not present

## 2023-08-22 DIAGNOSIS — E785 Hyperlipidemia, unspecified: Secondary | ICD-10-CM | POA: Diagnosis not present

## 2023-08-22 DIAGNOSIS — E1122 Type 2 diabetes mellitus with diabetic chronic kidney disease: Secondary | ICD-10-CM | POA: Diagnosis not present

## 2023-08-22 DIAGNOSIS — N183 Chronic kidney disease, stage 3 unspecified: Secondary | ICD-10-CM | POA: Diagnosis not present

## 2023-08-22 DIAGNOSIS — N179 Acute kidney failure, unspecified: Secondary | ICD-10-CM | POA: Diagnosis not present

## 2023-08-22 DIAGNOSIS — Z7984 Long term (current) use of oral hypoglycemic drugs: Secondary | ICD-10-CM | POA: Diagnosis not present

## 2023-08-22 DIAGNOSIS — Z66 Do not resuscitate: Secondary | ICD-10-CM | POA: Diagnosis not present

## 2023-08-22 DIAGNOSIS — K219 Gastro-esophageal reflux disease without esophagitis: Secondary | ICD-10-CM | POA: Diagnosis not present

## 2023-08-22 DIAGNOSIS — Z79899 Other long term (current) drug therapy: Secondary | ICD-10-CM | POA: Diagnosis not present

## 2023-08-22 DIAGNOSIS — I2510 Atherosclerotic heart disease of native coronary artery without angina pectoris: Secondary | ICD-10-CM | POA: Diagnosis not present

## 2023-08-22 DIAGNOSIS — N3001 Acute cystitis with hematuria: Secondary | ICD-10-CM | POA: Diagnosis not present

## 2023-08-22 DIAGNOSIS — Z7982 Long term (current) use of aspirin: Secondary | ICD-10-CM | POA: Diagnosis not present

## 2023-08-22 DIAGNOSIS — I129 Hypertensive chronic kidney disease with stage 1 through stage 4 chronic kidney disease, or unspecified chronic kidney disease: Secondary | ICD-10-CM | POA: Diagnosis not present

## 2023-08-22 NOTE — Telephone Encounter (Signed)
 Agree with ED visit for dehydration concerns associated with vomiting

## 2023-08-22 NOTE — Telephone Encounter (Signed)
 Please see the message below, per the telephone encounter the pt has been referred to go to the ED.

## 2023-08-22 NOTE — Telephone Encounter (Signed)
 Chief Complaint: Vomiting Symptoms: Vomiting Frequency: since yesterday Pertinent Negatives: Patient denies n/a Disposition: [x] ED /[] Urgent Care (no appt availability in office) / [] Appointment(In office/virtual)/ []  [facility]/ [] Home Care/ [] Refused Recommended Disposition /[] [facility]/ []  Follow-up with PCP Additional Notes: Patient's daughter in law called in (not currently with patient) to report patient has had continuous episodes of vomiting since yesterday. Daughter in law states she has encouraged patient to go to ED for rehydration and assessment but patient states she does not want to because she is fearful she will be admitted into the hospital. Daughter in law states patient has been hospitalized several times over the last year for nausea/vomiting and has followed up with PCP several times, and states that patient is reporting weakness and inability to eat or drink. This RN advised to daughter in law to advise patient Nurse Triage line recommends ED for rehydration and evaluation as well. Advised to have patient call us back with any questions and concerns.    Copied from CRM [id number]. Topic: Clinical - Red Word Triage >> Aug 22, 2023  4:11 PM Nyra Capes wrote: Red Word that prompted transfer to Nurse Triage: Erie Noe, Patients daughter in law, calling in. Patient has been vomiting starting 03/11 and 03/12 any time she eats and drinks she vomits Reason for Disposition  [1] SEVERE vomiting (e.g., 6 or more times/day) AND [2] present > 8 hours (Exception: Patient sounds well, is drinking liquids, does not sound dehydrated, and vomiting has lasted less than 24 hours.)  Answer Assessment - Initial Assessment Questions 1. VOMITING SEVERITY: "How many times have you vomited in the past 24 hours?"     - MILD:  1 - 2 times/day    - MODERATE: 3 - 5 times/day, decreased oral intake without significant weight loss or symptoms of dehydration    - SEVERE: 6 or more  times/day, vomits everything or nearly everything, with significant weight loss, symptoms of dehydration      N/a 2. ONSET: "When did the vomiting begin?"      Yesterday 3. FLUIDS: "What fluids or food have you vomited up today?" "Have you been able to keep any fluids down?"     Unable to keep fluids down  4. ABDOMEN PAIN: "Are your having any abdomen pain?" If Yes : "How bad is it and what does it feel like?" (e.g., crampy, dull, intermittent, constant)      N/a 5. DIARRHEA: "Is there any diarrhea?" If Yes, ask: "How many times today?"      Patient hasn't mentioned 6. CONTACTS: "Is there anyone else in the family with the same symptoms?"      No 7. CAUSE: "What do you think is causing your vomiting?"     Unsure 9. OTHER SYMPTOMS: "Do you have any other symptoms?" (e.g., fever, headache, vertigo, vomiting blood or coffee grounds, recent head injury)     Weak, nauseous  Protocols used: Vomiting-A-AH

## 2023-08-23 ENCOUNTER — Encounter (INDEPENDENT_AMBULATORY_CARE_PROVIDER_SITE_OTHER): Payer: Medicare HMO

## 2023-08-23 ENCOUNTER — Ambulatory Visit (INDEPENDENT_AMBULATORY_CARE_PROVIDER_SITE_OTHER): Payer: Medicare HMO | Admitting: Nurse Practitioner

## 2023-08-23 ENCOUNTER — Ambulatory Visit (INDEPENDENT_AMBULATORY_CARE_PROVIDER_SITE_OTHER): Payer: Medicare HMO

## 2023-08-23 DIAGNOSIS — N3001 Acute cystitis with hematuria: Secondary | ICD-10-CM | POA: Diagnosis not present

## 2023-08-24 ENCOUNTER — Telehealth: Payer: Self-pay

## 2023-08-24 NOTE — Transitions of Care (Post Inpatient/ED Visit) (Signed)
 08/24/2023  Name: Cynthia Sims MRN: [id number] DOB: 1950-08-08  Today's TOC FU Call Status: Today's TOC FU Call Status:: Successful TOC FU Call Completed TOC FU Call Complete Date: 08/24/23 Patient's Name and Date of Birth confirmed.  Transition Care Management Follow-up Telephone Call Date of Discharge: 08/23/23 Discharge Facility: Other [facility]) Name of Other (Non[facility]) Discharge Facility: [facility] Type of Discharge: Emergency Department Reason for ED Visit: Other: (cystitis) How have you been since you were released from the hospital?: Better Any questions or concerns?: No  Items Reviewed: Did you receive and understand the discharge instructions provided?: Yes Medications obtained,verified, and reconciled?: Yes (Medications Reviewed) Any new allergies since your discharge?: No Dietary orders reviewed?: Yes Do you have support at home?: Yes People in Home: spouse  Medications Reviewed Today: Medications Reviewed Today     Reviewed by Karena Addison, LPN (Licensed Practical Nurse) on 08/24/23 at [phone]  Med List Status: <None>   Medication Order Taking? Sig Documenting Provider Last Dose Status Informant  acetaminophen (TYLENOL) 500 MG tablet [id number] No Take 500 mg by mouth every 6 (six) hours as needed.  [provider] Taking Active Self           Med Note Nelia Shi   Fri May 18, 2023  7:13 AM)    aspirin EC 81 MG tablet [id number] No Take 81 mg by mouth daily.  [provider] Taking Active Self           Med Note Berle Mull, Mindi Curling Aug 02, 2017  8:59 AM)    carvedilol (COREG) 25 MG tablet [id number]  Take 0.5 tablets (12.5 mg total) by mouth 2 (two) times daily. Sharlene Dory, PA-C  Active   cholestyramine (QUESTRAN) 4 g packet [id number] No DISSOLVE AND TAKE ONE PACKET BY MOUTH THREE TIMES DAILY Debera Lat, PA-C Taking Active   clopidogrel (PLAVIX) 75 MG tablet [id number] No Take 1 tablet by mouth once daily Schnier,  Latina Craver, MD Taking Active   cyanocobalamin 1000 MCG tablet [id number] No Take 1,000 mcg by mouth daily. [provider] Taking Active Self  DULoxetine (CYMBALTA) 60 MG capsule [id number] No Take 1 capsule by mouth once daily Simmons-Robinson, Makiera, MD Taking Active   gabapentin (NEURONTIN) 100 MG capsule [id number] No TAKE 1 CAPSULE BY MOUTH THREE TIMES DAILY Simmons-Robinson, Makiera, MD Taking Active Self  GVOKE HYPOPEN 2-PACK 1 MG/0.2ML SOAJ [id number] No Inject 1 mg as directed as directed.  INJECT 1 PEN IN CASE OF SEVERE HYPOGLYCEMIA [provider] Taking Active Self  Insulin Glargine (BASAGLAR KWIKPEN) 100 UNIT/ML [id number] No INJECT 24 UNITS SUBCUTANEOUSLY ONCE DAILY Simmons-Robinson, Makiera, MD Taking Active   isosorbide mononitrate (IMDUR) 30 MG 24 hr tablet [id number] No Take 1 tablet by mouth once daily Chandrasekhar, Mahesh A, MD Taking Active   lovastatin (MEVACOR) 40 MG tablet [id number] No Take 1 tablet by mouth once daily Ostwalt, Janna, PA-C Taking Active   metFORMIN (GLUCOPHAGE) 500 MG tablet [id number] No TAKE 1 TABLET BY MOUTH TWICE DAILY WITH A MEAL Simmons-Robinson, Makiera, MD Taking Active   nitroGLYCERIN (NITROSTAT) 0.4 MG SL tablet [id number] No Place 1 tablet (0.4 mg total) under the tongue every 5 (five) minutes as needed for chest pain. Christell Constant, MD Taking Active Self           Med Note Nelia Shi   Fri May 18, 2023  7:13 AM)    ondansetron (ZOFRAN-ODT) 4 MG disintegrating tablet [id number] No  Take 1 tablet (4 mg total) by mouth every 8 (eight) hours as needed for nausea or vomiting. Simmons-Robinson, Makiera, MD Taking Active Self  pantoprazole (PROTONIX) 40 MG tablet [id number] No Take 1 tablet (40 mg total) by mouth daily. Schnier, Latina Craver, MD Taking Active Self  Semaglutide,0.25 or 0.5MG /DOS, 2 MG/3ML Namon Cirri [id number] No Inject into the skin. [provider] Taking Active   trimethoprim (TRIMPEX) 100 MG tablet [id number] No  Take 1 tablet (100 mg total) by mouth daily. Alfredo Martinez, MD Taking Active             Home Care and Equipment/Supplies: Were Home Health Services Ordered?: NA Any new equipment or medical supplies ordered?: NA  Functional Questionnaire: Do you need assistance with bathing/showering or dressing?: No Do you need assistance with meal preparation?: No Do you need assistance with eating?: No Do you have difficulty maintaining continence: No Do you need assistance with getting out of bed/getting out of a chair/moving?: No Do you have difficulty managing or taking your medications?: No  Follow up appointments reviewed: PCP Follow-up appointment confirmed?: Yes Date of PCP follow-up appointment?: 08/27/23 Follow-up Provider: [facility] Follow-up appointment confirmed?: Yes Date of Specialist follow-up appointment?: 09/03/23 Follow-Up Specialty Provider:: Uro Do you need transportation to your follow-up appointment?: No Do you understand care options if your condition(s) worsen?: Yes-patient verbalized understanding    SIGNATURE Karena Addison, LPN [facility] Nurse Health Advisor Direct Dial [phone]

## 2023-08-27 ENCOUNTER — Encounter: Payer: Self-pay | Admitting: Family Medicine

## 2023-08-27 ENCOUNTER — Ambulatory Visit: Payer: Medicare HMO | Admitting: Family Medicine

## 2023-08-27 VITALS — BP 95/52 | HR 68 | Ht 61.0 in | Wt 148.0 lb

## 2023-08-27 DIAGNOSIS — E1169 Type 2 diabetes mellitus with other specified complication: Secondary | ICD-10-CM

## 2023-08-27 DIAGNOSIS — Z09 Encounter for follow-up examination after completed treatment for conditions other than malignant neoplasm: Secondary | ICD-10-CM | POA: Diagnosis not present

## 2023-08-27 DIAGNOSIS — N1832 Chronic kidney disease, stage 3b: Secondary | ICD-10-CM | POA: Diagnosis not present

## 2023-08-27 DIAGNOSIS — I9589 Other hypotension: Secondary | ICD-10-CM | POA: Diagnosis not present

## 2023-08-27 DIAGNOSIS — Z7985 Long-term (current) use of injectable non-insulin antidiabetic drugs: Secondary | ICD-10-CM

## 2023-08-27 DIAGNOSIS — E785 Hyperlipidemia, unspecified: Secondary | ICD-10-CM | POA: Diagnosis not present

## 2023-08-27 DIAGNOSIS — R531 Weakness: Secondary | ICD-10-CM | POA: Diagnosis not present

## 2023-08-27 DIAGNOSIS — Z1211 Encounter for screening for malignant neoplasm of colon: Secondary | ICD-10-CM

## 2023-08-27 DIAGNOSIS — R9389 Abnormal findings on diagnostic imaging of other specified body structures: Secondary | ICD-10-CM

## 2023-08-27 DIAGNOSIS — N939 Abnormal uterine and vaginal bleeding, unspecified: Secondary | ICD-10-CM | POA: Diagnosis not present

## 2023-08-27 NOTE — Assessment & Plan Note (Signed)
 Chronically fluctuating blood pressure measurements  Followed by cardiology  Recently discontinued irbesartan and reduced coreg to 12.5mg  BID  F/u with cardiology as scheduled

## 2023-08-27 NOTE — Assessment & Plan Note (Signed)
 Follow up for this problem  US showed thickened endometrium, will refer to GYN for endometrial biopsy to evaluate for malignancy

## 2023-08-27 NOTE — Assessment & Plan Note (Addendum)
 Type 2 diabetes managed with metformin, Jardiance, and insulin. Mounjaro replaced with ozempic 0.25mg  weekly. A1c was 6.5 in Jan 2025.  - DC ozempic 0.5mg  weekly until she follows up with endocrinology in may, f/u with PCP in 6 weeks - Continue metformin 500 mg twice a day - Continue Jardiance 10 mg daily - Continue insulin 24 units once a day - continue to follow up with endocrinology as scheduled

## 2023-08-27 NOTE — Assessment & Plan Note (Addendum)
 Chronic  Last metabolic panel with GFR decreased to 24 in the ED likely 2/2 to dehydration and vomiting  Repeat BMP ordered today

## 2023-08-27 NOTE — Progress Notes (Signed)
 Established patient visit   [name]   DOB: 1951-03-08   73 y.o. Female  MRN: [id number] Visit Date: 08/27/2023  Today's healthcare provider: [name], MD   Chief Complaint  Patient presents with   Follow-up    She was seen and treated  the ED for UTI, she is feeling better just weak.    Diabetes    No concerns    Subjective     HPI     Follow-up    Additional comments: She was seen and treated  the ED for UTI, she is feeling better just weak.         Diabetes    Additional comments: No concerns       Last edited by Thedora Hinders, CMA on 08/27/2023  8:21 AM.       Discussed the use of AI scribe software for clinical note transcription with the patient, who gave verbal consent to proceed.  History of Present Illness   Cynthia Sims is a 73 year old female who presents with abnormal uterine bleeding.  She is here for follow-up after an ED visit on August 23, 2023, where she was treated for acute cystitis accompanied by nausea and vomiting. She completed a course of antibiotics and feels better. She has a scheduled follow-up with her urologist on September 03, 2023.  She underwent a transvaginal ultrasound for abnormal uterine bleeding, which revealed a thickened endometrium. She is being referred to gynecology for further assessment and potential endometrial biopsy.  She has a history of chronic hypotension, with a current blood pressure reading of 95/52 mmHg. Her cardiologist recently stopped irbesartan and decreased her carvedilol dosage. She continues to feel weak overall.  She reports significant weight loss, from 195 lbs to 148 lbs, over an unspecified period. She is currently on Ozempic 0.25 mg, which she has not increased, and metformin. She experiences nausea and decreased appetite, which she associates with UTIs.  She describes persistent weakness, particularly in her legs, and reports feeling unsteady, requiring the use  of a cane. No lightheadedness or dizziness, but her legs do not feel steady. She has not had recent physical therapy for strengthening or endurance.     TOC call completed 08/24/23 Patient was evaluated in the ED on 08/23/23 in [facility] [place] for acute cystitis associated with nausea and emesis  Scheduled to follow up with urology on 09/03/23  Follow up for abnormal uterine bleeding vaginal ultrasound was completed on 08/08/23, read notes thickened endometrium with no masses, recommend GYN evaluation for endometrial sampling     Past Medical History:  Diagnosis Date   ALT (SGPT) level raised    Anemia    Anxiety    Arthritis    Depression    Diabetes mellitus without complication (HCC)    Elevated cholesterol    GERD (gastroesophageal reflux disease)    Hemorrhoids    Hypertension    Kidney disease     Medications: Outpatient Medications Prior to Visit  Medication Sig   acetaminophen (TYLENOL) 500 MG tablet Take 500 mg by mouth every 6 (six) hours as needed.    aspirin EC 81 MG tablet Take 81 mg by mouth daily.    carvedilol (COREG) 25 MG tablet Take 0.5 tablets (12.5 mg total) by mouth 2 (two) times daily.   cholestyramine (QUESTRAN) 4 g packet DISSOLVE AND TAKE ONE PACKET BY MOUTH THREE TIMES DAILY   clopidogrel (PLAVIX) 75 MG tablet Take 1  tablet by mouth once daily   cyanocobalamin 1000 MCG tablet Take 1,000 mcg by mouth daily.   DULoxetine (CYMBALTA) 60 MG capsule Take 1 capsule by mouth once daily   gabapentin (NEURONTIN) 100 MG capsule TAKE 1 CAPSULE BY MOUTH THREE TIMES DAILY   GVOKE HYPOPEN 2-PACK 1 MG/0.2ML SOAJ Inject 1 mg as directed as directed.  INJECT 1 PEN IN CASE OF SEVERE HYPOGLYCEMIA   Insulin Glargine (BASAGLAR KWIKPEN) 100 UNIT/ML INJECT 24 UNITS SUBCUTANEOUSLY ONCE DAILY   isosorbide mononitrate (IMDUR) 30 MG 24 hr tablet Take 1 tablet by mouth once daily   lovastatin (MEVACOR) 40 MG tablet Take 1 tablet by mouth once daily   metFORMIN (GLUCOPHAGE) 500 MG  tablet TAKE 1 TABLET BY MOUTH TWICE DAILY WITH A MEAL   nitroGLYCERIN (NITROSTAT) 0.4 MG SL tablet Place 1 tablet (0.4 mg total) under the tongue every 5 (five) minutes as needed for chest pain.   ondansetron (ZOFRAN-ODT) 4 MG disintegrating tablet Take 1 tablet (4 mg total) by mouth every 8 (eight) hours as needed for nausea or vomiting.   pantoprazole (PROTONIX) 40 MG tablet Take 1 tablet (40 mg total) by mouth daily.   Semaglutide,0.25 or 0.5MG /DOS, 2 MG/3ML SOPN Inject into the skin.   trimethoprim (TRIMPEX) 100 MG tablet Take 1 tablet (100 mg total) by mouth daily.   No facility-administered medications prior to visit.    Review of Systems  Last metabolic panel Lab Results  Component Value Date   GLUCOSE 68 (L) 05/28/2023   NA 144 05/28/2023   K 3.8 05/28/2023   CL 110 (H) 05/28/2023   CO2 15 (L) 05/28/2023   BUN 16 05/28/2023   CREATININE 1.50 (H) 05/28/2023   EGFR 36 (L) 05/28/2023   CALCIUM 7.8 (L) 05/28/2023   PROT 5.3 (L) 05/18/2023   ALBUMIN 2.5 (L) 05/18/2023   LABGLOB 2.6 06/09/2022   AGRATIO 1.6 06/09/2022   BILITOT 0.5 05/18/2023   ALKPHOS 54 05/18/2023   AST 18 05/18/2023   ALT 12 05/18/2023   ANIONGAP 4 (L) 05/21/2023   Last hemoglobin A1c Lab Results  Component Value Date   HGBA1C 7.4 (H) 12/25/2022        Objective    BP (!) 95/52   Pulse 68   Ht 5\' 1"  (1.549 m)   Wt 148 lb (67.1 kg)   SpO2 100%   BMI 27.96 kg/m   BP Readings from Last 3 Encounters:  08/27/23 (!) 95/52  08/17/23 (!) 100/56  07/30/23 (!) 108/54   Wt Readings from Last 3 Encounters:  08/27/23 148 lb (67.1 kg)  08/17/23 150 lb 6.4 oz (68.2 kg)  07/30/23 155 lb 4.8 oz (70.4 kg)       Physical Exam Vitals reviewed.  Constitutional:      General: She is not in acute distress.    Appearance: Normal appearance. She is not ill-appearing, toxic-appearing or diaphoretic.     Comments: Elderly female, walking with cane  Eyes:     Conjunctiva/sclera: Conjunctivae normal.   Cardiovascular:     Rate and Rhythm: Normal rate and regular rhythm.     Pulses: Normal pulses.     Heart sounds: Normal heart sounds. No murmur heard.    No friction rub. No gallop.  Pulmonary:     Effort: Pulmonary effort is normal. No respiratory distress.     Breath sounds: Normal breath sounds. No stridor. No wheezing, rhonchi or rales.  Abdominal:     General: Bowel sounds are normal. There  is no distension.     Palpations: Abdomen is soft.     Tenderness: There is generalized abdominal tenderness.  Musculoskeletal:     Right lower leg: No edema.     Left lower leg: No edema.  Skin:    Findings: No erythema or rash.  Neurological:     Mental Status: She is alert and oriented to person, place, and time.  Psychiatric:        Mood and Affect: Mood and affect normal.        Speech: Speech normal.        Behavior: Behavior normal. Behavior is cooperative.       No results found for any visits on 08/27/23.   Assessment & Plan     Problem List Items Addressed This Visit       Cardiovascular and Mediastinum   Hypotension   Chronically fluctuating blood pressure measurements  Followed by cardiology  Recently discontinued irbesartan and reduced coreg to 12.5mg  BID  F/u with cardiology as scheduled       Relevant Orders   AMB Referral VBCI Care Management     Endocrine   Type 2 diabetes mellitus with hyperlipidemia (HCC)   Type 2 diabetes managed with metformin, Jardiance, and insulin. Mounjaro replaced with ozempic 0.25mg  weekly. A1c was 6.5 in Jan 2025.  - DC ozempic 0.5mg  weekly until she follows up with endocrinology in may, f/u with PCP in 6 weeks - Continue metformin 500 mg twice a day - Continue Jardiance 10 mg daily - Continue insulin 24 units once a day - continue to follow up with endocrinology as scheduled       Relevant Orders   AMB Referral VBCI Care Management     Genitourinary   Chronic kidney disease (CKD), stage III (moderate) (HCC)   Chronic   Last metabolic panel with GFR decreased to 24 in the ED likely 2/2 to dehydration and vomiting  Repeat BMP ordered today        Relevant Orders   BMP8+EGFR   AMB Referral VBCI Care Management   Abnormal uterine bleeding - Primary   Follow up for this problem  US showed thickened endometrium, will refer to GYN for endometrial biopsy to evaluate for malignancy       Relevant Orders   Ambulatory referral to Gynecology   Other Visit Diagnoses       Thickened endometrium       Relevant Orders   Ambulatory referral to Gynecology     Hospital discharge follow-up         Weakness generalized       Relevant Orders   Ambulatory referral to Home Health   AMB Referral VBCI Care Management   Ambulatory referral to Gastroenterology     Colon cancer screening       Relevant Orders   Ambulatory referral to Gastroenterology          Abnormal Uterine Bleeding Transvaginal ultrasound revealed thickened endometrium in a postmenopausal female, raising concern for endometrial hyperplasia or malignancy. - Refer to gynecology for endometrial biopsy - Discuss biopsy procedure and its purpose with her  Acute Cystitis Treated in the ED on August 23, 2023, for acute cystitis with nausea and vomiting. Completed antibiotics and reports improvement, but continues to experience overall weakness, possibly related to recent illness and medication adjustments. - Follow up with urologist on September 03, 2023  Generalized Weakness Persistent weakness, particularly in legs, and unsteadiness on feet. Nausea and vomiting may contribute to  weakness. Concerns about deconditioning and nutritional status. - Order basic metabolic panel - Consider home health physical therapy if insurance allows - Refer to [facility] for chronic care management - Encourage nutritional support with protein bars or supplements  Chronic Hypotension Chronic hypotension with current blood pressure of 95/52 mmHg.  Recent cardiology visit resulted in stopping irbesartan and decreasing carvedilol. Continues to feel weak and unsteady, possibly related to hypotension and medication changes. - Follow up with cardiology as scheduled  Type 2 Diabetes Mellitus Diabetes well-controlled with A1c of 6.5%. On Ozempic and metformin, but significant weight loss and weakness are concerning. Decision to hold Ozempic and metformin to prevent further weight loss and address weakness. - Hold Ozempic and metformin until the next visit in May - Monitor blood glucose levels - Discuss potential need to adjust diabetes management if weight loss continues  General Health Maintenance Due for colonoscopy as part of routine cancer screening. Last colonoscopy in 2019, repeat needed. - Schedule colonoscopy for colon cancer screening         Return in about 6 weeks (around 10/08/2023) for weakness f/u weight loss .         [name], MD  [facility] [phone] (phone) [phone] (fax)  [facility]

## 2023-08-28 DIAGNOSIS — E1142 Type 2 diabetes mellitus with diabetic polyneuropathy: Secondary | ICD-10-CM | POA: Diagnosis not present

## 2023-08-29 ENCOUNTER — Telehealth: Payer: Self-pay

## 2023-08-29 DIAGNOSIS — I9589 Other hypotension: Secondary | ICD-10-CM | POA: Diagnosis not present

## 2023-08-29 DIAGNOSIS — Z9181 History of falling: Secondary | ICD-10-CM | POA: Diagnosis not present

## 2023-08-29 DIAGNOSIS — D631 Anemia in chronic kidney disease: Secondary | ICD-10-CM | POA: Diagnosis not present

## 2023-08-29 DIAGNOSIS — Z7902 Long term (current) use of antithrombotics/antiplatelets: Secondary | ICD-10-CM | POA: Diagnosis not present

## 2023-08-29 DIAGNOSIS — N924 Excessive bleeding in the premenopausal period: Secondary | ICD-10-CM | POA: Diagnosis not present

## 2023-08-29 DIAGNOSIS — Z794 Long term (current) use of insulin: Secondary | ICD-10-CM | POA: Diagnosis not present

## 2023-08-29 DIAGNOSIS — K649 Unspecified hemorrhoids: Secondary | ICD-10-CM | POA: Diagnosis not present

## 2023-08-29 DIAGNOSIS — F32A Depression, unspecified: Secondary | ICD-10-CM | POA: Diagnosis not present

## 2023-08-29 DIAGNOSIS — Z7982 Long term (current) use of aspirin: Secondary | ICD-10-CM | POA: Diagnosis not present

## 2023-08-29 DIAGNOSIS — E7800 Pure hypercholesterolemia, unspecified: Secondary | ICD-10-CM | POA: Diagnosis not present

## 2023-08-29 DIAGNOSIS — I129 Hypertensive chronic kidney disease with stage 1 through stage 4 chronic kidney disease, or unspecified chronic kidney disease: Secondary | ICD-10-CM | POA: Diagnosis not present

## 2023-08-29 DIAGNOSIS — E1169 Type 2 diabetes mellitus with other specified complication: Secondary | ICD-10-CM | POA: Diagnosis not present

## 2023-08-29 DIAGNOSIS — N3000 Acute cystitis without hematuria: Secondary | ICD-10-CM | POA: Diagnosis not present

## 2023-08-29 DIAGNOSIS — N1832 Chronic kidney disease, stage 3b: Secondary | ICD-10-CM | POA: Diagnosis not present

## 2023-08-29 DIAGNOSIS — Z7984 Long term (current) use of oral hypoglycemic drugs: Secondary | ICD-10-CM | POA: Diagnosis not present

## 2023-08-29 DIAGNOSIS — K219 Gastro-esophageal reflux disease without esophagitis: Secondary | ICD-10-CM | POA: Diagnosis not present

## 2023-08-29 DIAGNOSIS — E1122 Type 2 diabetes mellitus with diabetic chronic kidney disease: Secondary | ICD-10-CM | POA: Diagnosis not present

## 2023-08-29 DIAGNOSIS — F419 Anxiety disorder, unspecified: Secondary | ICD-10-CM | POA: Diagnosis not present

## 2023-08-29 NOTE — Progress Notes (Unsigned)
 Care Guide Pharmacy Note  08/29/2023 Name: Cynthia Sims MRN: [id number] DOB: 01-30-1951  Referred By: [name], MD Reason for referral: Care Coordination (Outreach to schedule with Pharm d and LCSW )   Cynthia Sims is a 73 y.o. year old female who is a primary care patient of Simmons-Robinson, Tawanna Cooler, MD.  Cynthia Sims was referred to the pharmacist for assistance related to: DMII and CKD Stage 3  An unsuccessful telephone outreach was attempted today to contact the patient who was referred to the pharmacy team for assistance with medication management. Additional attempts will be made to contact the patient.  Penne Lash , RMA     [facility]  [facility], [facility] Guide  Direct Dial: [phone]  Website: [email or web address]

## 2023-08-30 NOTE — Progress Notes (Signed)
 Care Guide Pharmacy Note  08/30/2023 Name: Cynthia Sims MRN: [id number] DOB: November 19, 1950  Referred By: [name], MD Reason for referral: Care Coordination (Outreach to schedule with Pharm d and LCSW )   Cynthia Sims is a 73 y.o. year old female who is a primary care patient of Simmons-Robinson, Tawanna Cooler, MD.  Cynthia Sims was referred to the pharmacist for assistance related to: HLD and DMII  Successful contact was made with the patient to discuss pharmacy services including being ready for the pharmacist to call at least 5 minutes before the scheduled appointment time and to have medication bottles and any blood pressure readings ready for review. The patient agreed to meet with the pharmacist via telephone visit on (date/time).09/11/2023  Penne Lash , RMA     [place]  [facility], [facility] Guide  Direct Dial: [phone]  Website: [email or web address]

## 2023-08-30 NOTE — Progress Notes (Signed)
 Complex Care Management Note  Care Guide Note 08/30/2023 Name: Cynthia Sims MRN: [id number] DOB: 02-Apr-1951  Cynthia Sims is a 73 y.o. year old female who sees Simmons-Robinson, Tawanna Cooler, MD for primary care. I reached out to Dani Gobble by phone today to offer complex care management services.  [name] was given information about Complex Care Management services today including:   The Complex Care Management services include support from the care team which includes your Nurse Care Manager, Clinical Social Worker, or Pharmacist.  The Complex Care Management team is here to help remove barriers to the health concerns and goals most important to you. Complex Care Management services are voluntary, and the patient may decline or stop services at any time by request to their care team member.   Complex Care Management Consent Status: Patient agreed to services and verbal consent obtained.   Follow up plan:  Telephone appointment with complex care management team member scheduled for:  09/10/2023  Encounter Outcome:  Patient Scheduled  Penne Lash , RMA     [place]  [facility], [facility] Guide  Direct Dial: [phone]  Website: [email or web address]

## 2023-08-31 ENCOUNTER — Telehealth: Payer: Self-pay

## 2023-08-31 NOTE — Telephone Encounter (Signed)
 Called and was able to give a verbal order per Dr Roxan Hockey for Physical Therapy Two times a week for 2 weeks and once a week for 2 weeks starting 09/03/23. She verbally stated she understood

## 2023-08-31 NOTE — Telephone Encounter (Signed)
Ok for verbal orders.    [name], MD  [facility]  

## 2023-08-31 NOTE — Telephone Encounter (Signed)
 Copied from CRM [id number]. Topic: Clinical - Home Health Verbal Orders >> Aug 31, 2023 12:33 PM Antwanette L wrote: Caller/Agency: Cesaro from [facility] Callback Number: [phone] Service Requested: Physical Therapy Frequency: Two times a week for 2 weeks and once a week for 2 weeks starting 09/03/23 Any new concerns about the patient? Ceasro wants to if the patient should be taking Irbesartan?

## 2023-09-03 ENCOUNTER — Ambulatory Visit: Payer: HMO | Admitting: Urology

## 2023-09-03 VITALS — BP 138/60 | HR 68

## 2023-09-03 DIAGNOSIS — N3020 Other chronic cystitis without hematuria: Secondary | ICD-10-CM | POA: Diagnosis not present

## 2023-09-03 DIAGNOSIS — R339 Retention of urine, unspecified: Secondary | ICD-10-CM

## 2023-09-03 DIAGNOSIS — N1832 Chronic kidney disease, stage 3b: Secondary | ICD-10-CM | POA: Diagnosis not present

## 2023-09-03 LAB — URINALYSIS, COMPLETE
Bilirubin, UA: NEGATIVE
Glucose, UA: NEGATIVE
Ketones, UA: NEGATIVE
Nitrite, UA: NEGATIVE
RBC, UA: NEGATIVE
Specific Gravity, UA: >1.03 — ABNORMAL HIGH (ref 1.005–1.030)
Urobilinogen, Ur: 0.2 mg/dL (ref 0.2–1.0)
pH, UA: 5.5 (ref 5.0–7.5)

## 2023-09-03 LAB — MICROSCOPIC EXAMINATION

## 2023-09-03 MED ORDER — TRIMETHOPRIM 100 MG PO TABS
100.0000 mg | ORAL_TABLET | Freq: Every day | ORAL | 11 refills | Status: AC
Start: 2023-09-03 — End: ?

## 2023-09-03 NOTE — Progress Notes (Signed)
 09/03/2023 2:01 PM   Cynthia Sims 1951/02/19 [id number]  Referring provider: [name], MD [address] [place],  [place] [address]  Chief Complaint  Patient presents with   Cysto    HPI:  had the pleasure of seeing Cynthia Sims in urology clinic today in consultation for urinary retention and recurrent UTI from Dr. Beryle Flock.  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  She was previously followed by urogynecology at [facility], and had urodynamics in 2016.  Urodynamics showed no evidence of detrusor overactivity.  There was reduced bladder sensation, normal compliance, and atonic detrusor.  She was offered InterStim at that time, but that she did not follow-up.  Baseline creatinine is approximately 1.4.  Recent CT in May 2019 did not show any hydronephrosis.  She is also had difficulty with recurrent UTIs recently, including Klebsiella 02/20/2018.  She does have some pelvic pain with UTI.  However, on chart review it is unclear if she has been treated for asymptomatic bacteriuria versus true urinary tract infection.     She has previously undergone pelvic floor physical therapy which did not improve her voiding.   Today Incomplete bladder emptying stable.  Clinically not infected.  I agree that sometimes she gets cloudy urine that comes and goes.  She is currently on a once a day antibiotic and she is not certain if it is helping.  I am not convinced that she is getting many symptomatic urinary tract infections.  I will not send today's urine and overtreated   She still catheterizing 4 times a day.  She is continent.  She does feel bladder fullness.  Other than oral hypoglycemics she does not have other neurologic issues   We talked about InterStim in detail.  Usual template detail.  The role of the test stimulation discussed.  Recognizing she has retention I still  offered the PNE and not the surgical lead stage I.  We will try to leave it in longer.  Based upon the clinical presentation and assessment at [facility] I did not recommend repeat testing.  If she did have InterStim could be done here locally     she wants to think about it.  I gave her my phone number in [place] if she ever to have the test stimulation.  I can understand why she is hesitant.  I think the chance of success is likely 10 to 20% but not higher.  Otherwise she will be seen as needed.  Treat only symptomatic urinary tract infections     I saw the patient in 2019 No infections.  Continent in between catheterization 4 times a day  Patient is stable incomplete bladder emptying. We will get a 2 years or so. Call if abnormal. Were not can to proceed with test stimulation. I think the chance of helping is approximately 10% or so    Today Patient catheterizes 4 times a day.  She is continent.  She says he was recently admitted to the hospital.  She thinks she may have a bladder infection now because the urine is strong smelling but no other symptoms. Patient had a CT scan May 18, 2023.  No hydronephrosis.  Kidneys look healthy.  She had little bit of inflammatory changes around the bladder keeping with cystitis.  She had a urine culture September 2024 and it was negative   Patient was in the hospital in December for chest  pain.  She was having some intermittent abdominal pain.  The note said she had Citrobacter in the urine and was given Rocephin.  I actually did not see any urine cultures separate and blood cultures were negative   Patient a month ago had a little bit of blood on the light pad that she wears for confidence.  She says she did not see blood in the catheter.  Patient has never smoked  I think would be best to place the patient on urinary prophylaxis. The internist felt that she was having recurrent UTIs but the diagnosis of such is vague. I will call if the culture is  positive. She will see primary care or gynecology to rule out uterine bleeding. For safety I will have her come back for cystoscopy on trimethoprim 100 mg 30 x 11.    Today Incomplete bladder stable.  Last culture positive Clinically not infected today.  Has seen no more blood in urine On pelvic examination patient had grade 2 hypermobility the bladder neck and negative cough test.  She has suburethral swellings and in my opinion does not have a diverticulum On cystoscopy bladder mucosa and trigone were normal.  She had large capacity bladder.  Urine was a bit cloudy with a few white flecks and aspirated and sent for culture.  No carcinoma.    PMH: Past Medical History:  Diagnosis Date   ALT (SGPT) level raised    Anemia    Anxiety    Arthritis    Depression    Diabetes mellitus without complication (HCC)    Elevated cholesterol    GERD (gastroesophageal reflux disease)    Hemorrhoids    Hypertension    Kidney disease     Surgical History: Past Surgical History:  Procedure Laterality Date   abdominal blockage     CESAREAN SECTION  1983   CESAREAN SECTION     CHOLECYSTECTOMY     COLONOSCOPY WITH PROPOFOL N/A 10/29/2015   Procedure: COLONOSCOPY WITH PROPOFOL;  Surgeon: Scot Jun, MD;  Location: [facility] ENDOSCOPY;  Service: Endoscopy;  Laterality: N/A;   COLONOSCOPY WITH PROPOFOL N/A 11/07/2017   Procedure: COLONOSCOPY WITH PROPOFOL;  Surgeon: Toledo, Boykin Nearing, MD;  Location: [facility] ENDOSCOPY;  Service: Gastroenterology;  Laterality: N/A;   DILATION AND CURETTAGE OF UTERUS  2001   ESOPHAGOGASTRODUODENOSCOPY (EGD) WITH PROPOFOL N/A 10/29/2015   Procedure: ESOPHAGOGASTRODUODENOSCOPY (EGD) WITH PROPOFOL;  Surgeon: Scot Jun, MD;  Location: [facility] ENDOSCOPY;  Service: Endoscopy;  Laterality: N/A;   LOWER EXTREMITY ANGIOGRAPHY Right 10/24/2022   Procedure: Lower Extremity Angiography;  Surgeon: Renford Dills, MD;  Location: [facility] INVASIVE CV LAB;  Service: Cardiovascular;   Laterality: Right;   LOWER EXTREMITY ANGIOGRAPHY Left 10/31/2022   Procedure: Lower Extremity Angiography;  Surgeon: Renford Dills, MD;  Location: [facility] INVASIVE CV LAB;  Service: Cardiovascular;  Laterality: Left;    Home Medications:  Allergies as of 09/03/2023   No Known Allergies      Medication List        Accurate as of September 03, 2023  2:01 PM. If you have any questions, ask your nurse or doctor.          acetaminophen 500 MG tablet Commonly known as: TYLENOL Take 500 mg by mouth every 6 (six) hours as needed.   aspirin EC 81 MG tablet Take 81 mg by mouth daily.   Basaglar KwikPen 100 UNIT/ML INJECT 24 UNITS SUBCUTANEOUSLY ONCE DAILY   carvedilol 25 MG tablet Commonly known as:  COREG Take 0.5 tablets (12.5 mg total) by mouth 2 (two) times daily.   cholestyramine 4 g packet Commonly known as: QUESTRAN DISSOLVE AND TAKE ONE PACKET BY MOUTH THREE TIMES DAILY   clopidogrel 75 MG tablet Commonly known as: PLAVIX Take 1 tablet by mouth once daily   cyanocobalamin 1000 MCG tablet Take 1,000 mcg by mouth daily.   DULoxetine 60 MG capsule Commonly known as: CYMBALTA Take 1 capsule by mouth once daily   gabapentin 100 MG capsule Commonly known as: NEURONTIN TAKE 1 CAPSULE BY MOUTH THREE TIMES DAILY   Gvoke HypoPen 2-Pack 1 MG/0.2ML Soaj Generic drug: Glucagon Inject 1 mg as directed as directed.  INJECT 1 PEN IN CASE OF SEVERE HYPOGLYCEMIA   isosorbide mononitrate 30 MG 24 hr tablet Commonly known as: IMDUR Take 1 tablet by mouth once daily   lovastatin 40 MG tablet Commonly known as: MEVACOR Take 1 tablet by mouth once daily   metFORMIN 500 MG tablet Commonly known as: GLUCOPHAGE TAKE 1 TABLET BY MOUTH TWICE DAILY WITH A MEAL   nitroGLYCERIN 0.4 MG SL tablet Commonly known as: NITROSTAT Place 1 tablet (0.4 mg total) under the tongue every 5 (five) minutes as needed for chest pain.   ondansetron 4 MG disintegrating tablet Commonly known as:  ZOFRAN-ODT Take 1 tablet (4 mg total) by mouth every 8 (eight) hours as needed for nausea or vomiting.   pantoprazole 40 MG tablet Commonly known as: Protonix Take 1 tablet (40 mg total) by mouth daily.   Semaglutide(0.25 or 0.5MG /DOS) 2 MG/3ML Sopn Inject into the skin.   trimethoprim 100 MG tablet Commonly known as: TRIMPEX Take 1 tablet (100 mg total) by mouth daily.        Allergies: No Known Allergies  Family History: Family History  Problem Relation Age of Onset   Alzheimer's disease Mother    COPD Mother    Breast cancer Mother 86   Osteoporosis Mother    Heart attack Father    Lupus Father    Diabetes Father    Hypertension Father    Diabetes Sister    COPD Maternal Grandmother    Diabetes Paternal Grandfather    Diabetes Sister    Diabetes Sister     Social History:  reports that she has never smoked. She has never been exposed to tobacco smoke. She has never used smokeless tobacco. She reports that she does not currently use alcohol. She reports that she does not use drugs.  ROS:                                        Physical Exam: There were no vitals taken for this visit.  Constitutional:  Alert and oriented, No acute distress.   Laboratory Data: Lab Results  Component Value Date   WBC 7.1 05/28/2023   HGB 10.7 (L) 05/28/2023   HCT 32.7 (L) 05/28/2023   MCV 91 05/28/2023   PLT 199 05/28/2023    Lab Results  Component Value Date   CREATININE 1.50 (H) 05/28/2023    No results found for: "PSA"  No results found for: "TESTOSTERONE"  Lab Results  Component Value Date   HGBA1C 7.4 (H) 12/25/2022    Urinalysis    Component Value Date/Time   COLORURINE YELLOW (A) 05/18/2023 0011   APPEARANCEUR Cloudy (A) 07/16/2023 1051   LABSPEC 1.020 05/18/2023 0011   PHURINE 5.0 05/18/2023 0011  GLUCOSEU 3+ (A) 07/16/2023 1051   HGBUR SMALL (A) 05/18/2023 0011   BILIRUBINUR Negative 07/16/2023 1051   KETONESUR NEGATIVE  05/18/2023 0011   PROTEINUR 2+ (A) 07/16/2023 1051   PROTEINUR NEGATIVE 05/18/2023 0011   UROBILINOGEN 0.2 02/24/2019 1123   NITRITE Positive (A) 07/16/2023 1051   NITRITE NEGATIVE 05/18/2023 0011   LEUKOCYTESUR Trace (A) 07/16/2023 1051   LEUKOCYTESUR LARGE (A) 05/18/2023 0011    Pertinent Imaging: Urine reviewed and sent for culture  Assessment & Plan: Incomplete bladder emptying stable.  Reassurance given.  See once a year on trimethoprim.  Call if culture positive.  Prescription renewed.  I will treat if the culture is positive but patient knows this might be colonization.  Prescription renewed and see in 1 year  She does have a biopsy planned with gynecology in 1 week of the endometrium or cervix  1. Chronic cystitis (Primary)  - Urinalysis, Complete   No follow-ups on file.  Martina Sinner, MD  [facility] [address] [place], [place] [address] [phone]

## 2023-09-04 LAB — BMP8+EGFR
BUN/Creatinine Ratio: 13 (ref 12–28)
BUN: 26 mg/dL (ref 8–27)
CO2: 14 mmol/L — ABNORMAL LOW (ref 20–29)
Calcium: 8.1 mg/dL — ABNORMAL LOW (ref 8.7–10.3)
Chloride: 109 mmol/L — ABNORMAL HIGH (ref 96–106)
Creatinine, Ser: 2.04 mg/dL — ABNORMAL HIGH (ref 0.57–1.00)
Glucose: 90 mg/dL (ref 70–99)
Potassium: 4.7 mmol/L (ref 3.5–5.2)
Sodium: 138 mmol/L (ref 134–144)
eGFR: 25 mL/min/{1.73_m2} — ABNORMAL LOW (ref >59)

## 2023-09-06 LAB — CULTURE, URINE COMPREHENSIVE

## 2023-09-10 ENCOUNTER — Ambulatory Visit: Payer: Self-pay | Admitting: *Deleted

## 2023-09-10 NOTE — Patient Outreach (Signed)
 Care Coordination   Initial Visit Note   09/10/2023 Name: Cynthia Sims MRN: [id number] DOB: [birth year]  Cynthia Sims is a 73 y.o. year old female who sees Simmons-Robinson, Tawanna Cooler, MD for primary care. I spoke with  Dani Gobble by phone today.  What matters to the patients health and wellness today?  Decreasing symptoms of depresion    Goals Addressed             This Visit's Progress    patient to increase sociallization and partipation in outsiide activities        EMOTIONAL / MENTAL HEALTH SUPPORT Keep all upcoming appointment discussed today Continue with compliance of taking medication prescribed by Doctor Self Support options  (continue to consider increasing socialization and development of  outside interests) Contact your provider with any new questions or concerns         SDOH assessments and interventions completed:  Yes  SDOH Interventions Today    Flowsheet Row Most Recent Value  SDOH Interventions   Food Insecurity Interventions Intervention Not Indicated, Other (Comment)  [applied for food stamps but was denied(1.00 over the limit)]  Housing Interventions Intervention Not Indicated  Transportation Interventions Intervention Not Indicated  Utilities Interventions Intervention Not Indicated        Care Coordination Interventions:  Yes, provided  Interventions Today    Flowsheet Row Most Recent Value  Chronic Disease   Chronic disease during today's visit Diabetes, Chronic Kidney Disease/End Stage Renal Disease (ESRD)  [Weakness generalized]  General Interventions   General Interventions Discussed/Reviewed General Interventions Discussed, Doctor Visits  [Patient assessed for community resource/mental health needs related to symptoms of depression]  Doctor Visits Discussed/Reviewed Specialist, PCP  [biopsy on uterus 4/3, PCP 4/28. AWV 4/29]  Mental Health Interventions   Mental Health Discussed/Reviewed Mental Health Discussed, Coping  Strategies, Depression  [confirms depressed mood at times related to challenges adjusting to her current condition and required life changes. Emotional support provided, utlized motivational interviewing to encourage increase in socialization and development of outside interests]  Safety Interventions   Safety Discussed/Reviewed Safety Discussed  [patient denied thoughts of harm to self or other, encouraged calling [phone] in the event of a a mental health crisis]       Follow up plan: Follow up call scheduled for 10/01/23    Encounter Outcome:  Patient Visit Completed

## 2023-09-10 NOTE — Patient Instructions (Signed)
 Visit Information  Thank you for taking time to visit with me today. Please don't hesitate to contact me if I can be of assistance to you.   Following are the goals we discussed today:   Goals Addressed             This Visit's Progress    patient to increase sociallization and partipation in outsiide activities        EMOTIONAL / MENTAL HEALTH SUPPORT Keep all upcoming appointment discussed today Continue with compliance of taking medication prescribed by Doctor Self Support options  (continue to consider increasing socialization and development of  outside interests) Contact your provider with any new questions or concerns         Our next appointment is by telephone on /10/01/23 at 11am  Please call the care guide team at [phone] if you need to cancel or reschedule your appointment.   If you are experiencing a Mental Health or Behavioral Health Crisis or need someone to talk to, please call the Suicide and Crisis Lifeline: 988   Patient verbalizes understanding of instructions and care plan provided today and agrees to view in MyChart. Active MyChart status and patient understanding of how to access instructions and care plan via MyChart confirmed with patient.     Telephone follow up appointment with care management team member scheduled for: 10/01/23   Verna Czech, LCSW [place]  [facility], [facility] Licensed Clinical Social Worker Care Coordinator  Direct Dial: [phone]

## 2023-09-11 ENCOUNTER — Other Ambulatory Visit: Payer: Self-pay | Admitting: Pharmacist

## 2023-09-11 ENCOUNTER — Telehealth: Payer: Self-pay | Admitting: Family Medicine

## 2023-09-11 DIAGNOSIS — E1169 Type 2 diabetes mellitus with other specified complication: Secondary | ICD-10-CM | POA: Diagnosis not present

## 2023-09-11 DIAGNOSIS — E1122 Type 2 diabetes mellitus with diabetic chronic kidney disease: Secondary | ICD-10-CM | POA: Diagnosis not present

## 2023-09-11 DIAGNOSIS — N1832 Chronic kidney disease, stage 3b: Secondary | ICD-10-CM | POA: Diagnosis not present

## 2023-09-11 DIAGNOSIS — E7800 Pure hypercholesterolemia, unspecified: Secondary | ICD-10-CM | POA: Diagnosis not present

## 2023-09-11 DIAGNOSIS — K219 Gastro-esophageal reflux disease without esophagitis: Secondary | ICD-10-CM | POA: Diagnosis not present

## 2023-09-11 DIAGNOSIS — I9589 Other hypotension: Secondary | ICD-10-CM | POA: Diagnosis not present

## 2023-09-11 DIAGNOSIS — F32A Depression, unspecified: Secondary | ICD-10-CM | POA: Diagnosis not present

## 2023-09-11 DIAGNOSIS — N3000 Acute cystitis without hematuria: Secondary | ICD-10-CM | POA: Diagnosis not present

## 2023-09-11 DIAGNOSIS — N924 Excessive bleeding in the premenopausal period: Secondary | ICD-10-CM | POA: Diagnosis not present

## 2023-09-11 DIAGNOSIS — I129 Hypertensive chronic kidney disease with stage 1 through stage 4 chronic kidney disease, or unspecified chronic kidney disease: Secondary | ICD-10-CM | POA: Diagnosis not present

## 2023-09-11 DIAGNOSIS — F419 Anxiety disorder, unspecified: Secondary | ICD-10-CM | POA: Diagnosis not present

## 2023-09-11 DIAGNOSIS — D631 Anemia in chronic kidney disease: Secondary | ICD-10-CM | POA: Diagnosis not present

## 2023-09-11 NOTE — Progress Notes (Signed)
 09/11/2023 Name: Cynthia Sims MRN: [id number] DOB: 1950-09-11  Chief Complaint  Patient presents with   Hypertension   Diabetes    Cynthia Sims is a 73 y.o. year old female who presented for a telephone visit.   They were referred to the pharmacist by their PCP for assistance in managing diabetes and hypertension.   Referred due to BP changes and generalized weakness recently with weight loss Changed BP meds and stopped Ozempic  Subjective:  Care Team: Primary Care Provider: [name], MD ; Next Scheduled Visit: 10/08/23 Clinical Pharmacist: Ricka Burdock, PharmD  Medication Access/Adherence  Current Pharmacy:  [facility] [address], [place] - [address] [place] [address] Phone: [phone] Fax: [phone]   Patient reports affordability concerns with their medications: No  Patient reports access/transportation concerns to their pharmacy: No  Patient reports adherence concerns with their medications:  No     Diabetes:  Current medications: Basaglar 24 units nightly, Metformin 500mg  1 tablet twice a day, Jardiance 10mg  daily Medications tried in the past: Ozempic 0.5mg  weekly (stopped due to weakness concerns), Mounjaro 5mg  weekly (switched to Ozempic after AKI hospitalization), Glipizide  Unknown- was 155 during phone call Using Dexcom 7 and managed by Endocrinology   Patient denies hypoglycemic s/sx including dizziness, shakiness, sweating. Patient denies hyperglycemic symptoms including polyuria, polydipsia, polyphagia, nocturia, neuropathy, blurred vision.  Current meal patterns:  Breakfast: Skips usually Lunch: Vegetable plate, green beans, potatoes + gravy, corn, okra (a lot of vegetables) Dinner: Skips sometimes, "whatever she decides to eat" Drinks: Water, occasional unsweet tea *No snacks and no taste for meat recently  Not hungry most of the time; no difference since stopping Ozempic  Current  medication access support: [employer]   Hypotension:  Current medications: Carvedilol 25mg  half tablet twice a day, Imdur 30mg  daily Medications previously tried: Losartan 100mg  daily (switched to Irbesartan), Irbesartan 75mg  daily (stopped by hospital due to AKI)  Patient has a validated, automated, upper arm home BP cuff Current blood pressure readings readings: Unknown- PT has been checking it lately and had no concerns  Patient denies hypotensive s/sx including dizziness, lightheadedness.  Patient denies hypertensive symptoms including headache, chest pain, shortness of breath  Current physical activity: Occasional stationary bike   Objective:  Lab Results  Component Value Date   HGBA1C 7.4 (H) 12/25/2022    Lab Results  Component Value Date   CREATININE 2.04 (H) 09/03/2023   BUN 26 09/03/2023   NA 138 09/03/2023   K 4.7 09/03/2023   CL 109 (H) 09/03/2023   CO2 14 (L) 09/03/2023    Lab Results  Component Value Date   CHOL 131 12/25/2022   HDL 58 12/25/2022   LDLCALC 45 12/25/2022   TRIG 168 (H) 12/25/2022   CHOLHDL 2.3 12/25/2022    Medications Reviewed Today   Medications were not reviewed in this encounter       Assessment/Plan:   Diabetes: - Currently controlled - Reviewed long term cardiovascular and renal outcomes of uncontrolled blood sugar - Reviewed goal A1c, goal fasting, and goal 2 hour post prandial glucose - Recommend to check glucose continuously with Dexcom CGM     Hypotension: - Currently controlled - Reviewed long term cardiovascular and renal outcomes of uncontrolled blood pressure - Reviewed appropriate blood pressure monitoring technique and reviewed goal blood pressure. Recommended to check home blood pressure and heart rate every other day - Recommend to monitor for dizziness     Follow Up  Plan:  - Set task to see BP reading at next office visit and consider CGM review of BG's after visit with Dr. Roxan Hockey - Unable to  get BG and BP readings today as patient was driving during the call - Weakness has not changed since starting PT (coming 1-2 times per week) - Debating on whether rollator is needed at this time- only near miss falls so far - Advised she is eating strictly vegetables at this time- would recommend adding protein shake to her diet to help with muscle growth since leg weakness is her main concern - Ozempic discontinuation has not changed her appetite at all- still not eating a lot or hungry   Ricka Burdock, PharmD [facility]  Phone Number: [phone]

## 2023-09-11 NOTE — Telephone Encounter (Signed)
 [employer] sent in certification to be completed on 3/26 and it was completed by front desk then placed in provider mailbox and signed 09/11/23 and faxed back to the agency

## 2023-09-13 ENCOUNTER — Other Ambulatory Visit (HOSPITAL_COMMUNITY)
Admission: RE | Admit: 2023-09-13 | Discharge: 2023-09-13 | Disposition: A | Discharge status: Home / Self Care | Source: Ambulatory Visit | Attending: Obstetrics and Gynecology | Admitting: Obstetrics and Gynecology

## 2023-09-13 ENCOUNTER — Encounter: Payer: Self-pay | Admitting: Obstetrics and Gynecology

## 2023-09-13 ENCOUNTER — Ambulatory Visit: Admitting: Obstetrics and Gynecology

## 2023-09-13 VITALS — BP 156/63 | HR 66 | Ht 61.0 in | Wt 152.9 lb

## 2023-09-13 DIAGNOSIS — R9389 Abnormal findings on diagnostic imaging of other specified body structures: Secondary | ICD-10-CM | POA: Diagnosis not present

## 2023-09-13 DIAGNOSIS — N859 Noninflammatory disorder of uterus, unspecified: Secondary | ICD-10-CM

## 2023-09-13 DIAGNOSIS — N858 Other specified noninflammatory disorders of uterus: Secondary | ICD-10-CM | POA: Diagnosis not present

## 2023-09-13 DIAGNOSIS — N950 Postmenopausal bleeding: Secondary | ICD-10-CM

## 2023-09-13 DIAGNOSIS — Z7689 Persons encountering health services in other specified circumstances: Secondary | ICD-10-CM

## 2023-09-13 NOTE — Progress Notes (Signed)
 Patient presents today to discuss recent ultrasound and PMB. She states this has been ongoing for approximately one year and is usually light bleeding.

## 2023-09-13 NOTE — Progress Notes (Signed)
 HPI:      Ms. Cynthia Sims is a 73 y.o. G5P5 who LMP was No LMP recorded. Patient is postmenopausal.  Subjective:   She presents today because she has been having spotting over the last year.  She is menopausal.  She had an ultrasound which revealed a thickened endometrium (thickness not specified) She is not using any type of HRT. She states that she has previously been diagnosed with polyps and had no treatment for them but has not had any issues from them in more than 12 years. She has diabetes and hypertension.    Hx: The following portions of the patient's history were reviewed and updated as appropriate:             She  has a past medical history of ALT (SGPT) level raised, Anemia, Anxiety, Arthritis, Depression, Diabetes mellitus without complication (HCC), Elevated cholesterol, GERD (gastroesophageal reflux disease), Hemorrhoids, Hypertension, and Kidney disease. She does not have any pertinent problems on file. She  has a past surgical history that includes Cesarean section (1983); Dilation and curettage of uterus (2001); Cesarean section; Cholecystectomy; Colonoscopy with propofol (N/A, 10/29/2015); Esophagogastroduodenoscopy (egd) with propofol (N/A, 10/29/2015); Colonoscopy with propofol (N/A, 11/07/2017); abdominal blockage; Lower Extremity Angiography (Right, 10/24/2022); and Lower Extremity Angiography (Left, 10/31/2022). Her family history includes Alzheimer's disease in her mother; Breast cancer (age of onset: 29) in her mother; COPD in her maternal grandmother and mother; Diabetes in her father, paternal grandfather, sister, sister, and sister; Heart attack in her father; Hypertension in her father; Lupus in her father; Osteoporosis in her mother. She  reports that she has never smoked. She has never been exposed to tobacco smoke. She has never used smokeless tobacco. She reports that she does not currently use alcohol. She reports that she does not use drugs. She has a current  medication list which includes the following prescription(s): acetaminophen, aspirin ec, carvedilol, cholestyramine, clopidogrel, cyanocobalamin, duloxetine, gabapentin, gvoke hypopen 2-pack, basaglar kwikpen, isosorbide mononitrate, lovastatin, metformin, nitroglycerin, ondansetron, pantoprazole, semaglutide(0.25 or 0.5mg /dos), and trimethoprim. She has no known allergies.       Review of Systems:  Review of Systems  Constitutional: Denied constitutional symptoms, night sweats, recent illness, fatigue, fever, insomnia and weight loss.  Eyes: Denied eye symptoms, eye pain, photophobia, vision change and visual disturbance.  Ears/Nose/Throat/Neck: Denied ear, nose, throat or neck symptoms, hearing loss, nasal discharge, sinus congestion and sore throat.  Cardiovascular: Denied cardiovascular symptoms, arrhythmia, chest pain/pressure, edema, exercise intolerance, orthopnea and palpitations.  Respiratory: Denied pulmonary symptoms, asthma, pleuritic pain, productive sputum, cough, dyspnea and wheezing.  Gastrointestinal: Denied, gastro-esophageal reflux, melena, nausea and vomiting.  Genitourinary: See HPI for additional information.  Musculoskeletal: Denied musculoskeletal symptoms, stiffness, swelling, muscle weakness and myalgia.  Dermatologic: Denied dermatology symptoms, rash and scar.  Neurologic: Denied neurology symptoms, dizziness, headache, neck pain and syncope.  Psychiatric: Denied psychiatric symptoms, anxiety and depression.  Endocrine: Denied endocrine symptoms including hot flashes and night sweats.   Meds:   Current Outpatient Medications on File Prior to Visit  Medication Sig Dispense Refill   acetaminophen (TYLENOL) 500 MG tablet Take 500 mg by mouth every 6 (six) hours as needed.      aspirin EC 81 MG tablet Take 81 mg by mouth daily.      carvedilol (COREG) 25 MG tablet Take 0.5 tablets (12.5 mg total) by mouth 2 (two) times daily. 90 tablet 2   cholestyramine (QUESTRAN) 4  g packet DISSOLVE AND TAKE ONE PACKET BY MOUTH THREE TIMES  DAILY 270 each 0   clopidogrel (PLAVIX) 75 MG tablet Take 1 tablet by mouth once daily 90 tablet 3   cyanocobalamin 1000 MCG tablet Take 1,000 mcg by mouth daily.     DULoxetine (CYMBALTA) 60 MG capsule Take 1 capsule by mouth once daily 90 capsule 0   gabapentin (NEURONTIN) 100 MG capsule TAKE 1 CAPSULE BY MOUTH THREE TIMES DAILY 270 capsule 1   GVOKE HYPOPEN 2-PACK 1 MG/0.2ML SOAJ Inject 1 mg as directed as directed.  INJECT 1 PEN IN CASE OF SEVERE HYPOGLYCEMIA     Insulin Glargine (BASAGLAR KWIKPEN) 100 UNIT/ML INJECT 24 UNITS SUBCUTANEOUSLY ONCE DAILY 15 mL 0   isosorbide mononitrate (IMDUR) 30 MG 24 hr tablet Take 1 tablet by mouth once daily 90 tablet 2   lovastatin (MEVACOR) 40 MG tablet Take 1 tablet by mouth once daily 60 tablet 0   metFORMIN (GLUCOPHAGE) 500 MG tablet TAKE 1 TABLET BY MOUTH TWICE DAILY WITH A MEAL 180 tablet 3   nitroGLYCERIN (NITROSTAT) 0.4 MG SL tablet Place 1 tablet (0.4 mg total) under the tongue every 5 (five) minutes as needed for chest pain. 25 tablet 3   ondansetron (ZOFRAN-ODT) 4 MG disintegrating tablet Take 1 tablet (4 mg total) by mouth every 8 (eight) hours as needed for nausea or vomiting. 20 tablet 0   pantoprazole (PROTONIX) 40 MG tablet Take 1 tablet (40 mg total) by mouth daily. 30 tablet 11   Semaglutide,0.25 or 0.5MG /DOS, 2 MG/3ML SOPN Inject into the skin.     trimethoprim (TRIMPEX) 100 MG tablet Take 1 tablet (100 mg total) by mouth daily. 30 tablet 11   No current facility-administered medications on file prior to visit.      Objective:     Vitals:   09/13/23 0818 09/13/23 0917  BP: (!) 162/74 (!) 156/63  Pulse: 66    Filed Weights   09/13/23 0818  Weight: 152 lb 14.4 oz (69.4 kg)              Physical examination   Pelvic:   Vulva: Normal appearance.  No lesions.  Vagina: No lesions or abnormalities noted.  Atrophy noted  Support: Normal pelvic support.  Urethra No  masses tenderness or scarring.  Meatus Normal size without lesions or prolapse.  Cervix: Normal appearance.  No lesions.  Anus: Normal exam.  No lesions.  Perineum: Normal exam.  No lesions.        Bimanual   Uterus: Normal size.  Non-tender.  Mobile.  AV.  Adnexae: No masses.  Non-tender to palpation.  Cul-de-sac: Negative for abnormality.           Endometrial Biopsy After discussion with the patient regarding her abnormal uterine bleeding I recommended that she proceed with an endometrial biopsy for further diagnosis. The risks, benefits, alternatives, and indications for an endometrial biopsy were discussed with the patient in detail. She understood the risks including infection, bleeding, cervical laceration and uterine perforation.  Verbal consent was obtained.   PROCEDURE NOTE:  Vacurette endometrial biopsy was performed using aseptic technique with iodine preparation.  The uterus was sounded to a length of 6 cm.  Adequate sampling was obtained with minimal blood loss.  Very small sample obtained-likely atrophic endometrium.  I am certain I was within the endometrial cavity.  The patient tolerated the procedure well.  Disposition will be pending pathology   Assessment:    G5P5 Patient Active Problem List   Diagnosis Date Noted   Abnormal uterine bleeding 07/30/2023  Obesity, class 1 05/20/2023   Type 2 diabetes mellitus with hyperlipidemia (HCC) 05/19/2023   Acute cystitis 05/18/2023   Hypotension 05/18/2023   Coronary artery disease 05/18/2023   Metabolic acidosis 02/27/2023   Hypoglycemia due to type 2 diabetes mellitus (HCC) 02/26/2023   Acute renal failure superimposed on stage 3b chronic kidney disease (HCC) 02/26/2023   Thrombocytopenia (HCC) 02/26/2023   Anemia    Atherosclerosis of native arteries of extremity with intermittent claudication (HCC) 02/19/2023   PAD (peripheral artery disease) (HCC) 12/25/2022   Hernia of abdominal cavity 12/25/2022   Moderate  nonproliferative diabetic retinopathy (HCC) 10/17/2022   Hypertensive retinopathy of both eyes 10/17/2022   Claudication of both lower extremities (HCC) 08/23/2022   Essential hypertension 06/09/2022   Diabetic neuropathy (HCC) 05/22/2022   Mild cognitive disorder 03/13/2019   Chronic diarrhea 03/13/2019   Benign head tremor 03/13/2019   B12 deficiency 03/13/2019   H. 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 Establishing care with new doctor, encounter for   2. Thickened endometrium   3. PMB (postmenopausal bleeding)        Plan:            1.  Await endometrial biopsy findings.  Suspect vaginal atrophy as source of postmenopausal bleeding.  Doubt hyperplasia or malignancy. Orders No orders of the defined types were placed in this encounter.   No orders of the defined types were placed in this encounter.     F/U  Return for We will contact her with any abnormal test results.  Elonda Husky, M.D. 09/13/2023 9:27 AM

## 2023-09-17 ENCOUNTER — Telehealth: Payer: Self-pay

## 2023-09-17 ENCOUNTER — Encounter: Payer: Self-pay | Admitting: Family Medicine

## 2023-09-17 ENCOUNTER — Other Ambulatory Visit: Payer: Self-pay | Admitting: Physician Assistant

## 2023-09-17 DIAGNOSIS — N183 Chronic kidney disease, stage 3 unspecified: Secondary | ICD-10-CM

## 2023-09-17 LAB — SURGICAL PATHOLOGY

## 2023-09-17 NOTE — Telephone Encounter (Signed)
 Spoke with patient regarding her bleeding. She states bright red bleeding since Thursday's biopsy. Has been changing her pad 2-3 times per day. Advised patient that Dr. Logan Bores is out of the office until tomorrow and I will speak with him then, she is okay to wait for his response. Advised pt if the bleeding increases or becomes heavy enough to change a pad 2x/hour to go the the ED, she states she will.

## 2023-09-17 NOTE — Telephone Encounter (Signed)
 Pt calling triage, said she was here last Thursday for a biopsy. She is still bleeding, is this normal? Says blood is bright red, changing pad every 3-4 hours. Denies cramping and/or changing 2 or more full pads in less than one hour.

## 2023-09-17 NOTE — Addendum Note (Signed)
 Addended by: Thedora Hinders on: 09/17/2023 11:04 AM   Modules accepted: Orders

## 2023-09-18 NOTE — Telephone Encounter (Signed)
 LVM to discuss bleeding and follow-up with patient. Dr. Logan Bores states if her bleeding continues to be heavy she may be prescribed Aygestin 5 mg bid.

## 2023-09-18 NOTE — Telephone Encounter (Signed)
 Requested Prescriptions  Pending Prescriptions Disp Refills   lovastatin (MEVACOR) 40 MG tablet [Pharmacy Med Name: Lovastatin 40 MG Oral Tablet] 60 tablet 0    Sig: Take 1 tablet by mouth once daily     Cardiovascular:  Antilipid - Statins 2 Failed - 09/18/2023 10:43 AM      Failed - Cr in normal range and within 360 days    Creatinine  Date Value Ref Range Status  03/01/2012 1.72 (H) 0.60 - 1.30 mg/dL Final   Creatinine, Ser  Date Value Ref Range Status  09/03/2023 2.04 (H) 0.57 - 1.00 mg/dL Final   Creatinine, POC  Date Value Ref Range Status  08/02/2017 NA mg/dL Final         Failed - Lipid Panel in normal range within the last 12 months    Cholesterol, Total  Date Value Ref Range Status  12/25/2022 131 100 - 199 mg/dL Final   LDL Chol Calc (NIH)  Date Value Ref Range Status  12/25/2022 45 0 - 99 mg/dL Final   HDL  Date Value Ref Range Status  12/25/2022 58 >39 mg/dL Final   Triglycerides  Date Value Ref Range Status  12/25/2022 168 (H) 0 - 149 mg/dL Final         Passed - Patient is not pregnant      Passed - Valid encounter within last 12 months    Recent Outpatient Visits           3 weeks ago Abnormal uterine bleeding   [place] [facility] Simmons-Robinson, One [place], MD   1 month ago Abnormal uterine bleeding   [place] [facility] Simmons-Robinson, Tawanna Cooler, MD       Future Appointments             In 2 weeks Simmons-Robinson, Tawanna Cooler, MD [facility]   In 11 months MacDiarmid, Lorin Picket, MD [facility] [place]

## 2023-09-25 DIAGNOSIS — N1832 Chronic kidney disease, stage 3b: Secondary | ICD-10-CM | POA: Diagnosis not present

## 2023-09-26 LAB — BASIC METABOLIC PANEL WITH GFR
BUN/Creatinine Ratio: 11 — ABNORMAL LOW (ref 12–28)
BUN: 17 mg/dL (ref 8–27)
CO2: 15 mmol/L — ABNORMAL LOW (ref 20–29)
Calcium: 7 mg/dL — ABNORMAL LOW (ref 8.7–10.3)
Chloride: 110 mmol/L — ABNORMAL HIGH (ref 96–106)
Creatinine, Ser: 1.54 mg/dL — ABNORMAL HIGH (ref 0.57–1.00)
Glucose: 107 mg/dL — ABNORMAL HIGH (ref 70–99)
Potassium: 4.7 mmol/L (ref 3.5–5.2)
Sodium: 143 mmol/L (ref 134–144)
eGFR: 35 mL/min/{1.73_m2} — ABNORMAL LOW (ref >59)

## 2023-09-28 DIAGNOSIS — E1142 Type 2 diabetes mellitus with diabetic polyneuropathy: Secondary | ICD-10-CM | POA: Diagnosis not present

## 2023-10-01 ENCOUNTER — Encounter: Admitting: *Deleted

## 2023-10-02 ENCOUNTER — Other Ambulatory Visit: Payer: Self-pay

## 2023-10-02 ENCOUNTER — Ambulatory Visit: Payer: Self-pay | Admitting: *Deleted

## 2023-10-02 NOTE — Patient Outreach (Signed)
 Complex Care Management   Visit Note  10/02/2023  Name:  Cynthia Sims MRN: [id number] DOB: Dec 02, 1950  Situation: Referral received for Complex Care Management related to [place]/Behavioral Health diagnosis depression  Patient assessed, patient confirms that her mood has improved due to progress made regarding her physical health.  I obtained verbal consent from Patient.  Visit completed with patient  on the phone  Background:   Past Medical History:  Diagnosis Date   ALT (SGPT) level raised    Anemia    Anxiety    Arthritis    Depression    Diabetes mellitus without complication (HCC)    Elevated cholesterol    GERD (gastroesophageal reflux disease)    Hemorrhoids    Hypertension    Kidney disease     Assessment: Patient Reported Symptoms:  Cognitive        Neurological Neurological Review of Symptoms: No symptoms reported    HEENT HEENT Symptoms Reported: No symptoms reported      Cardiovascular Cardiovascular Symptoms Reported: Other: Other Cardiovascular Symptoms: Patient reports having a leaky valve-pt to schedule follow up with cardiologist Does patient have uncontrolled Hypertension?: No Cardiovascular Management Strategies: Medication therapy Weight: 146 lb (66.2 kg)  Respiratory Respiratory Symptoms Reported: No symptoms reported    Endocrine Is patient diabetic?: Yes Is patient checking blood sugars at home?: Yes (weast a dexocon) Endocrine Conditions: Diabetes Endocrine Management Strategies: Medication therapy, Weight management  Gastrointestinal Gastrointestinal Symptoms Reported: No symptoms reported      Genitourinary Genitourinary Symptoms Reported: No symptoms reported    Integumentary Integumentary Symptoms Reported: Bruising Additional Integumentary Details: Takes blood thinners Skin Management Strategies: Coping strategies  Musculoskeletal          Psychosocial Psychosocial Symptoms Reported: Depression - if selected complete PHQ  2-9 Additional Psychological Details: mood has improved due to improvement in medical condition, energy level has improved Behavioral Health Conditions: Depression Behavioral Management Strategies: Adequate rest, Coping strategies Major Change/Loss/Stressor/Fears (CP): Medical condition, self Techniques to Cope with Loss/Stress/Change: Diversional activities Quality of Family Relationships: supportive Do you feel physically threatened by others?: No      10/02/2023   10:22 AM  Depression screen PHQ 2/9  Decreased Interest 0  Down, Depressed, Hopeless 0  PHQ - 2 Score 0    There were no vitals filed for this visit.  Medications Reviewed Today     Reviewed by Ave Leisure, LCSW (Social Worker) on 10/02/23 at 1256  Med List Status: <None>   Medication Order Taking? Sig Documenting Provider Last Dose Status Informant  acetaminophen  (TYLENOL ) 500 MG tablet [id number] Yes Take 500 mg by mouth every 6 (six) hours as needed.  [provider] Taking Active Self           Med Note Thomasena Fleming   Fri May 18, 2023  7:13 AM)    aspirin  EC 81 MG tablet [id number] Yes Take 81 mg by mouth daily.  [provider] Taking Active Self           Med Note Garry [place] Aug 02, 2017  8:59 AM)    carvedilol  (COREG ) 25 MG tablet [id number] Yes Take 0.5 tablets (12.5 mg total) by mouth 2 (two) times daily. Von Grumbling, PA-C Taking Active   cholestyramine  (QUESTRAN ) 4 g packet [id number] Yes DISSOLVE AND TAKE ONE PACKET BY MOUTH THREE TIMES DAILY Ostwalt, Janna, PA-C Taking Active   clopidogrel  (PLAVIX ) 75 MG tablet [id number] Yes Take 1 tablet by  mouth once daily Schnier, Ninette Basque, MD Taking Active   cyanocobalamin  1000 MCG tablet [id number] Yes Take 1,000 mcg by mouth daily. [provider] Taking Active Self  DULoxetine  (CYMBALTA ) 60 MG capsule [id number] Yes Take 1 capsule by mouth once daily Simmons-Robinson, Makiera, MD Taking Active   gabapentin   (NEURONTIN ) 100 MG capsule [id number] Yes TAKE 1 CAPSULE BY MOUTH THREE TIMES DAILY Simmons-Robinson, Makiera, MD Taking Active Self  GVOKE HYPOPEN 2-PACK 1 MG/0.2ML SOAJ [id number] Yes Inject 1 mg as directed as directed.  INJECT 1 PEN IN CASE OF SEVERE HYPOGLYCEMIA [provider] Taking Active Self  Insulin  Glargine (BASAGLAR  KWIKPEN) 100 UNIT/ML [id number]  INJECT 24 UNITS SUBCUTANEOUSLY ONCE DAILY Simmons-Robinson, Makiera, MD  Active            Med Note (GRIFFIN, RAVEN M   Tue Sep 11, 2023  2:09 PM) 24 units nightly  isosorbide  mononitrate (IMDUR ) 30 MG 24 hr tablet [id number] Yes Take 1 tablet by mouth once daily Chandrasekhar, Mahesh A, MD Taking Active   lovastatin  (MEVACOR ) 40 MG tablet [id number] Yes Take 1 tablet by mouth once daily Simmons-Robinson, Makiera, MD Taking Active   metFORMIN  (GLUCOPHAGE ) 500 MG tablet [id number] Yes TAKE 1 TABLET BY MOUTH TWICE DAILY WITH A MEAL Simmons-Robinson, Makiera, MD Taking Active   nitroGLYCERIN  (NITROSTAT ) 0.4 MG SL tablet [id number] Yes Place 1 tablet (0.4 mg total) under the tongue every 5 (five) minutes as needed for chest pain. Jann Melody, MD Taking Active Self           Med Note Thomasena Fleming   Fri May 18, 2023  7:13 AM)    ondansetron  (ZOFRAN -ODT) 4 MG disintegrating tablet [id number] Yes Take 1 tablet (4 mg total) by mouth every 8 (eight) hours as needed for nausea or vomiting. Simmons-Robinson, Makiera, MD Taking Active Self  pantoprazole  (PROTONIX ) 40 MG tablet [id number] Yes Take 1 tablet (40 mg total) by mouth daily. Schnier, Ninette Basque, MD Taking Active Self  Semaglutide,0.25 or 0.5MG /DOS, 2 MG/3ML Charisse Conception [id number] Yes Inject into the skin. [provider] Taking Active   trimethoprim  (TRIMPEX ) 100 MG tablet [id number] Yes Take 1 tablet (100 mg total) by mouth daily. Erman Hayward, MD Taking Active             Recommendation:   No provider recommendations at this time.  Follow Up Plan:   Patient  confirms having no mental health /community resource needs at this time. No follow up at this time.  Alex Mcmanigal, LCSW [place]  [facility], [facility] Licensed Clinical Social Worker Care Coordinator  Direct Dial: [phone]

## 2023-10-02 NOTE — Patient Instructions (Signed)
 Visit Information  Thank you for taking time to visit with me today. Please don't hesitate to contact me if I can be of assistance to you before our next scheduled appointment.  Please call this social worker  at [phone] with any additional mental health.community resource concerns or needs   Following is a copy of your care plan:   Goals Addressed             This Visit's Progress    COMPLETED: patient to increase sociallization and partipation in outsiide activities        EMOTIONAL / MENTAL HEALTH SUPPORT Keep all upcoming appointment discussed today Continue with compliance of taking medication prescribed by Doctor Self Support options  (continue to consider increasing socialization and development of  outside interests) Contact your provider with any new questions or concerns         Please call 911 if you are experiencing a Mental Health or Behavioral Health Crisis or need someone to talk to.  Patient verbalizes understanding of instructions and care plan provided today and agrees to view in MyChart. Active MyChart status and patient understanding of how to access instructions and care plan via MyChart confirmed with patient.     Tayana Shankle, LCSW [place]  [facility], [facility] Licensed Clinical Social Worker Care Coordinator  Direct Dial: [phone]

## 2023-10-04 ENCOUNTER — Ambulatory Visit (INDEPENDENT_AMBULATORY_CARE_PROVIDER_SITE_OTHER): Payer: Self-pay | Admitting: Podiatry

## 2023-10-04 ENCOUNTER — Telehealth (INDEPENDENT_AMBULATORY_CARE_PROVIDER_SITE_OTHER): Payer: Self-pay | Admitting: Podiatry

## 2023-10-04 ENCOUNTER — Encounter: Payer: Self-pay | Admitting: Podiatry

## 2023-10-04 DIAGNOSIS — L03115 Cellulitis of right lower limb: Secondary | ICD-10-CM | POA: Diagnosis not present

## 2023-10-04 DIAGNOSIS — E0843 Diabetes mellitus due to underlying condition with diabetic autonomic (poly)neuropathy: Secondary | ICD-10-CM

## 2023-10-04 DIAGNOSIS — M79674 Pain in right toe(s): Secondary | ICD-10-CM | POA: Diagnosis not present

## 2023-10-04 DIAGNOSIS — I739 Peripheral vascular disease, unspecified: Secondary | ICD-10-CM

## 2023-10-04 DIAGNOSIS — M79675 Pain in left toe(s): Secondary | ICD-10-CM | POA: Diagnosis not present

## 2023-10-04 DIAGNOSIS — B351 Tinea unguium: Secondary | ICD-10-CM

## 2023-10-04 MED ORDER — CEPHALEXIN 500 MG PO CAPS: 500.0000 mg | ORAL_CAPSULE | Freq: Three times a day (TID) | ORAL | 0 refills | Status: DC

## 2023-10-04 NOTE — Patient Instructions (Addendum)
 Call Dr. Nelda Balsam office for an expedited appointment to assess your circulation in your lower extremities.  Cellulitis, Adult  Cellulitis is a skin infection. The infected area is often warm, red, swollen, and sore. It occurs most often on the legs, feet, and toes, but can happen on any part of the body. This condition can be life-threatening without treatment. It is very important to get treated right away. What are the causes? This condition is caused by bacteria. The bacteria enter through a break in the skin, such as: A cut. A burn. A bug bite. An animal bite. An open sore. A crack. What increases the risk? Having a weak body's defense system (immune system). Being older than 75 years old. Having a blood sugar problem (diabetes). Having a long-term liver disease (cirrhosis) or kidney disease. Being very overweight (obese). Having a skin problem, such as: An itchy rash. A rash caused by a fungus. A rash with blisters. Slow movement of blood in the veins (venous stasis). Fluid buildup below the skin (edema). This condition is more likely to occur in people who: Have open cuts, burns, bites, or scrapes on the skin. Have been treated with high-energy rays (radiation). Use IV drugs. What are the signs or symptoms? Skin that: Looks red or purple, or slightly darker than your usual skin color. Has streaks. Has spots. Is swollen. Is sore or painful when you touch it. Is warm. A fever. Chills. Blisters. Tiredness (fatigue). How is this treated? Medicines to treat infections or allergies. Rest. Placing cold or warm cloths on the skin. Staying in the hospital, if the condition is very bad. You may need medicines through an IV. Follow these instructions at home: Medicines Take over-the-counter and prescription medicines only as told by your doctor. If you were prescribed antibiotics, take them as told by your doctor. Do not stop using them even if you start to feel  better. General instructions Drink enough fluid to keep your pee (urine) pale yellow. Do not touch or rub the infected area. Raise (elevate) the infected area above the level of your heart while you are sitting or lying down. Return to your normal activities when your doctor says that it is safe. Place cold or warm cloths on the area as told by your doctor. Keep all follow-up visits. Your doctor will need to make sure that a more serious infection is not developing. Contact a doctor if: You have a fever. You do not start to get better after 1-2 days of treatment. Your bone or joint under the infected area starts to hurt after the skin has healed. Your infection comes back in the same area or another area. Signs of this may include: You have a swollen bump in the area. Your red area gets larger, turns dark in color, or hurts more. You have more fluid coming from the wound. Pus or a bad smell develops in your infected area. You have more pain. You feel sick and have muscle aches and weakness. You develop vomiting or watery poop that will not go away. Get help right away if: You see red streaks coming from the area. You notice the skin turns purple or black and falls off. These symptoms may be an emergency. Get help right away. Call 911. Do not wait to see if the symptoms will go away. Do not drive yourself to the hospital. This information is not intended to replace advice given to you by your health care provider. Make sure you discuss any questions  you have with your health care provider. Document Revised: 01/24/2022 Document Reviewed: 01/24/2022 Elsevier Patient Education  2024 [employer].

## 2023-10-04 NOTE — Progress Notes (Signed)
 Subjective:  Patient ID: Cynthia Sims, female    DOB: [birth year],  MRN: [id number]  Cynthia Sims presents to clinic today for at risk foot care. Patient has h/o PAD and painful elongated mycotic toenails 1-5 bilaterally which are tender when wearing enclosed shoe gear. Pain is relieved with periodic professional debridement.  Chief Complaint  Patient presents with   Diabetes    "I want her to trim my nails.  I want her to look at this redness.  I saw my doctor and she said it may be Cellulitis." N - redness foot L - rt dorsal D - over a week O - suddenly, gotten worse C - splotchy red, burns A - walking T - Saw PCP she said to ask Podiatrist.  Patient states she has a cat, but has not been scratched by her cat. She does relate pain in feet at night. She gets up and walks around to alleviate the pain.         PCP is Simmons-Robinson, Makiera, MD.  No Known Allergies  Review of Systems: Negative except as noted in the HPI.  Objective: No changes noted in today's physical examination. There were no vitals filed for this visit. Cynthia Sims is a pleasant 73 y.o. female WD, WN in NAD. AAO x 3.  Vascular Examination: CFT <4 seconds b/l. DP pulses diminished b/l. PT pulses diminished b/l. Digital hair absent. Skin temperature gradient warm to cool LLE. Dorsum of right foot warm and tender to touch, but toes are cool. No ischemia or gangrene. No cyanosis or clubbing noted b/l. Trace edema noted BLE. She does describe symptoms of intermittent rest pain.  Neurological Examination:  Pt has subjective symptoms of neuropathy. Protective sensation diminished with 10g monofilament b/l.  Dermatological Examination: Pedal skin thin taut and bound down. There is erythema to the dorsum of the right foot and toes on right foot are cool. No open wounds. No interdigital macerations. No visible abscesses appreciated.   Toenails 1-5 b/l thick, discolored, elongated with subungual debris  and pain on dorsal palpation.   No corns, calluses nor porokeratotic lesions noted.  Musculoskeletal Examination: Muscle strength 5/5 to all lower extremity muscle groups bilaterally. No pain, crepitus or joint limitation noted with ROM bilateral LE. Early contracture of lesser digits bilaterally which are reducible.  Radiographs: None  Last A1c:      Latest Ref Rng & Units 12/25/2022    9:25 AM  Hemoglobin A1C  Hemoglobin-A1c 4.8 - 5.6 % 7.4    Assessment/Plan: 1. Pain due to onychomycosis of toenails of both feet   2. Cellulitis of right foot   3. PAD (peripheral artery disease) (HCC)   4. Diabetes mellitus due to underlying condition with diabetic autonomic neuropathy, unspecified whether long term insulin  use (HCC)     Meds ordered this encounter  Medications   cephALEXin  (KEFLEX ) 500 MG capsule    Sig: Take 1 capsule (500 mg total) by mouth 3 (three) times daily.    Dispense:  21 capsule    Refill:  0    -Patient was evaluated today. All questions/concerns addressed on today's visit. -Discussed cellulitis and sent Rx for cephalexin  500 mg po tid x 7 days. Would like patient to call her Vascular MD to expedite appointment for her lower extremities. She related understanding. Will forward today's findings to him as well. She may now be having symptoms of rest pain.  -Patient to continue soft, supportive shoe gear daily. -Toenails 1-5 b/l were  debrided in length and girth with sterile nail nippers and dremel without iatrogenic bleeding.  -Rx for Keflex  500 mg po, #21, to be taken one capsule TID for 7 days. -Patient/POA to call should there be question/concern in the interim.   Return in about 9 weeks (around 12/06/2023).  Luella Sager, DPM      [facility] LOCATION: [address], [place] [address]                   Office [phone]   [facility] LOCATION: [address] [place], [place]  [address] Office [phone]

## 2023-10-04 NOTE — Telephone Encounter (Signed)
 Patient daughter in law Vermelle Cammarata) called and stated the provider, Dr. Prescilla Brod at [facility] was unable to see patient before May 8th due to no openings at this time before this date. Daughter in law would like to know what other options are available, due to the urgency of this matter. Daughter in law can be reached at [phone]

## 2023-10-05 NOTE — Telephone Encounter (Signed)
 Spoke to daughter in law, Lyndia Bury. Mrs. Serena was unable to get an expedited appointment with Dr. Prescilla Brod regarding her PAD. I placed Mrs. Stills on cephalexin  for cellulitis. She has no tissue loss, ingrown toenail or gangrene. I informed Sherian Dimitri if foot gets worse in color or wound develops to take her to the ED (she had planned to do this anyway). She related understanding and mother in law is on the cancellation list if Dr. Delphia Ficks office has a cancellation sooner than May 8th.

## 2023-10-08 ENCOUNTER — Other Ambulatory Visit: Payer: Self-pay | Admitting: Family Medicine

## 2023-10-08 ENCOUNTER — Encounter: Payer: Self-pay | Admitting: Family Medicine

## 2023-10-08 ENCOUNTER — Ambulatory Visit (INDEPENDENT_AMBULATORY_CARE_PROVIDER_SITE_OTHER): Admitting: Family Medicine

## 2023-10-08 VITALS — BP 141/58 | HR 65 | Ht 61.0 in | Wt 148.0 lb

## 2023-10-08 DIAGNOSIS — Z1231 Encounter for screening mammogram for malignant neoplasm of breast: Secondary | ICD-10-CM

## 2023-10-08 DIAGNOSIS — I2510 Atherosclerotic heart disease of native coronary artery without angina pectoris: Secondary | ICD-10-CM | POA: Diagnosis not present

## 2023-10-08 DIAGNOSIS — N3000 Acute cystitis without hematuria: Secondary | ICD-10-CM | POA: Diagnosis not present

## 2023-10-08 DIAGNOSIS — I739 Peripheral vascular disease, unspecified: Secondary | ICD-10-CM

## 2023-10-08 DIAGNOSIS — R634 Abnormal weight loss: Secondary | ICD-10-CM | POA: Diagnosis not present

## 2023-10-08 DIAGNOSIS — G250 Essential tremor: Secondary | ICD-10-CM | POA: Diagnosis not present

## 2023-10-08 DIAGNOSIS — I2583 Coronary atherosclerosis due to lipid rich plaque: Secondary | ICD-10-CM | POA: Diagnosis not present

## 2023-10-08 DIAGNOSIS — I10 Essential (primary) hypertension: Secondary | ICD-10-CM

## 2023-10-08 DIAGNOSIS — E1169 Type 2 diabetes mellitus with other specified complication: Secondary | ICD-10-CM

## 2023-10-08 DIAGNOSIS — E538 Deficiency of other specified B group vitamins: Secondary | ICD-10-CM | POA: Diagnosis not present

## 2023-10-08 DIAGNOSIS — F419 Anxiety disorder, unspecified: Secondary | ICD-10-CM | POA: Diagnosis not present

## 2023-10-08 DIAGNOSIS — K219 Gastro-esophageal reflux disease without esophagitis: Secondary | ICD-10-CM | POA: Diagnosis not present

## 2023-10-08 DIAGNOSIS — E785 Hyperlipidemia, unspecified: Secondary | ICD-10-CM | POA: Diagnosis not present

## 2023-10-08 DIAGNOSIS — F09 Unspecified mental disorder due to known physiological condition: Secondary | ICD-10-CM

## 2023-10-08 DIAGNOSIS — N1832 Chronic kidney disease, stage 3b: Secondary | ICD-10-CM | POA: Diagnosis not present

## 2023-10-08 DIAGNOSIS — G4733 Obstructive sleep apnea (adult) (pediatric): Secondary | ICD-10-CM | POA: Diagnosis not present

## 2023-10-08 NOTE — Progress Notes (Signed)
 Established patient visit   [name]   DOB: 1950-08-19   73 y.o. Female  MRN: [id number] Visit Date: 10/08/2023  Today's healthcare provider: Mimi Alt, MD   Chief Complaint  Patient presents with   Fatigue    She still feels some but not as bad as before   Weight Loss    No concerns   Subjective     HPI     Fatigue    Additional comments: She still feels some but not as bad as before        Weight Loss    Additional comments: No concerns      Last edited by Bart Lieu, CMA on 10/08/2023  8:29 AM.       Discussed the use of AI scribe software for clinical note transcription with the patient, who gave verbal consent to proceed.  History of Present Illness Cynthia Sims is a 73 year old female with peripheral arterial disease and coronary artery disease who presents with unintentional weight loss and weakness.  She has experienced unintentional weight loss, with her weight decreasing from 152 pounds on September 13, 2023, to 146 pounds on October 02, 2023. She currently weighs 148 pounds. She feels weak but notes that the weakness is not as severe as in the past. She expresses no concern about the weight loss, stating her current weight is acceptable.  She has peripheral arterial disease and experiences intermittent swelling in her left leg, particularly around the knee, associated with prolonged standing. The swelling is sometimes accompanied by tightness, similar to cramping. Elevation helps reduce the swelling, but compression does not. She is awaiting a vascular specialist appointment.  She was recently treated for cellulitis in her right foot by a podiatrist and is currently on Keflex  500 mg three times daily. She notes improvement in redness and swelling, with no pain or streaking in the leg.  Her chronic conditions include type two diabetes, managed with glargine 24 units daily, metformin  500 mg twice daily, and Ozempic 0.25  mg weekly. She also has coronary artery disease, managed with aspirin  81 mg daily, Plavix  75 mg daily, Imdur  30 mg daily, and nitroglycerin  as needed for chest pain. She takes lovastatin  40 mg daily for hyperlipidemia.  She has a history of vitamin B12 deficiency and takes 1000 micrograms daily of vitamin B12. She also takes duloxetine  60 mg once daily for anxiety and gabapentin  100 mg three times daily for leg pains. She has a Gvoke HypoPen for severe hypoglycemia.  Her last metabolic panel on September 25, 2023, showed a creatinine of 1.5, GFR of 35, calcium of 7, protein of 5.3, and albumin of 2.5. AST and ALT were within normal limits, glucose was 107, and potassium was 4.7. She takes calcium supplements as her calcium was low at the last visit.  No diarrhea from Keflex , no pain in her legs, and no streaking of the leg. She experiences swelling in her left leg, particularly around the knee, and notes that elevation helps reduce the swelling. She reports feeling thirsty often and drinks a lot of water.     Past Medical History:  Diagnosis Date   ALT (SGPT) level raised    Anemia    Anxiety    Arthritis    Depression    Diabetes mellitus without complication (HCC)    Elevated cholesterol    GERD (gastroesophageal reflux disease)    Hemorrhoids    Hypertension  Kidney disease     Medications: Outpatient Medications Prior to Visit  Medication Sig   acetaminophen  (TYLENOL ) 500 MG tablet Take 500 mg by mouth every 6 (six) hours as needed.    aspirin  EC 81 MG tablet Take 81 mg by mouth daily.    carvedilol  (COREG ) 25 MG tablet Take 0.5 tablets (12.5 mg total) by mouth 2 (two) times daily.   cephALEXin  (KEFLEX ) 500 MG capsule Take 1 capsule (500 mg total) by mouth 3 (three) times daily.   cholestyramine  (QUESTRAN ) 4 g packet DISSOLVE AND TAKE ONE PACKET BY MOUTH THREE TIMES DAILY   clopidogrel  (PLAVIX ) 75 MG tablet Take 1 tablet by mouth once daily   cyanocobalamin  1000 MCG tablet Take  1,000 mcg by mouth daily.   DULoxetine  (CYMBALTA ) 60 MG capsule Take 1 capsule by mouth once daily   gabapentin  (NEURONTIN ) 100 MG capsule TAKE 1 CAPSULE BY MOUTH THREE TIMES DAILY   GVOKE HYPOPEN 2-PACK 1 MG/0.2ML SOAJ Inject 1 mg as directed as directed.  INJECT 1 PEN IN CASE OF SEVERE HYPOGLYCEMIA   Insulin  Glargine (BASAGLAR  KWIKPEN) 100 UNIT/ML INJECT 24 UNITS SUBCUTANEOUSLY ONCE DAILY   isosorbide  mononitrate (IMDUR ) 30 MG 24 hr tablet Take 1 tablet by mouth once daily   lovastatin  (MEVACOR ) 40 MG tablet Take 1 tablet by mouth once daily   metFORMIN  (GLUCOPHAGE ) 500 MG tablet TAKE 1 TABLET BY MOUTH TWICE DAILY WITH A MEAL   nitroGLYCERIN  (NITROSTAT ) 0.4 MG SL tablet Place 1 tablet (0.4 mg total) under the tongue every 5 (five) minutes as needed for chest pain.   ondansetron  (ZOFRAN -ODT) 4 MG disintegrating tablet Take 1 tablet (4 mg total) by mouth every 8 (eight) hours as needed for nausea or vomiting.   pantoprazole  (PROTONIX ) 40 MG tablet Take 1 tablet (40 mg total) by mouth daily.   Semaglutide,0.25 or 0.5MG /DOS, 2 MG/3ML SOPN Inject into the skin.   trimethoprim  (TRIMPEX ) 100 MG tablet Take 1 tablet (100 mg total) by mouth daily.   No facility-administered medications prior to visit.    Review of Systems  Last CBC Lab Results  Component Value Date   WBC 7.1 05/28/2023   HGB 10.7 (L) 05/28/2023   HCT 32.7 (L) 05/28/2023   MCV 91 05/28/2023   MCH 29.6 05/28/2023   RDW 15.2 05/28/2023   PLT 199 05/28/2023   Last metabolic panel Lab Results  Component Value Date   GLUCOSE 107 (H) 09/25/2023   NA 143 09/25/2023   K 4.7 09/25/2023   CL 110 (H) 09/25/2023   CO2 15 (L) 09/25/2023   BUN 17 09/25/2023   CREATININE 1.54 (H) 09/25/2023   EGFR 35 (L) 09/25/2023   CALCIUM 7.0 (L) 09/25/2023   PROT 5.3 (L) 05/18/2023   ALBUMIN 2.5 (L) 05/18/2023   LABGLOB 2.6 06/09/2022   AGRATIO 1.6 06/09/2022   BILITOT 0.5 05/18/2023   ALKPHOS 54 05/18/2023   AST 18 05/18/2023   ALT 12  05/18/2023   ANIONGAP 4 (L) 05/21/2023   Last lipids Lab Results  Component Value Date   CHOL 131 12/25/2022   HDL 58 12/25/2022   LDLCALC 45 12/25/2022   TRIG 168 (H) 12/25/2022   CHOLHDL 2.3 12/25/2022   Last hemoglobin A1c Lab Results  Component Value Date   HGBA1C 7.4 (H) 12/25/2022   Last thyroid  functions Lab Results  Component Value Date   TSH 2.080 09/22/2021   No results found for: "VITAMINB12"   Last vitamin D No results found for: "25OHVITD2", "25OHVITD3", "VD25OH"  Objective    BP (!) 141/58   Pulse 65   Ht 5\' 1"  (1.549 m)   Wt 148 lb (67.1 kg)   SpO2 100%   BMI 27.96 kg/m  BP Readings from Last 3 Encounters:  10/08/23 (!) 141/58  09/13/23 (!) 156/63  09/03/23 138/60   Wt Readings from Last 3 Encounters:  10/08/23 148 lb (67.1 kg)  10/02/23 146 lb (66.2 kg)  09/13/23 152 lb 14.4 oz (69.4 kg)        Physical Exam  General: Alert, no acute distress Cardio: Normal S1 and S2, RRR, no r/m/g Pulm: CTAB, normal work of breathing Extremities: trace bilateral LE edema,decreasing erythema of right foot   No results found for any visits on 10/08/23.  Assessment & Plan     Problem List Items Addressed This Visit       Cardiovascular and Mediastinum   PAD (peripheral artery disease) (HCC)   Peripheral arterial disease with claudication. Reports intermittent swelling and tightness in left leg. Awaiting vascular appointment. Elevation and compression do not significantly reduce swelling. - Encourage elevation and compression for left leg swelling - Continue gabapentin  100 mg three times daily for leg pain      Essential hypertension   Chronic  Hypertension managed with carvedilol . Current blood pressure is 141/58. - Continue carvedilol  12.5 mg twice daily      Coronary artery disease   Chronic Coronary artery disease managed with Plavix  and Imdur . Reports no chest pain. - Continue Plavix  75 mg daily - Continue Imdur  30 mg daily - Use  nitroglycerin  as needed for chest pain        Respiratory   Apnea, sleep     Digestive   Acid reflux   Chronic, stable  Gastroesophageal reflux disease managed with Protonix . - Continue Protonix  40 mg daily        Endocrine   Type 2 diabetes mellitus with hyperlipidemia (HCC)   Type 2 diabetes mellitus with diabetic retinopathy Type 2 diabetes with diabetic retinopathy. Requires updated A1c for diabetes management. Currently on glargine and metformin . Reports no concerns about weight loss and feels less fatigue. Ozempic was discontinued due to previous issues. - Order updated A1c - Continue glargine 24 units daily - Continue metformin  500 mg twice daily - holding ozempic due to SE and excessive weight loss       Relevant Orders   Hemoglobin A1c   Hyperlipidemia associated with type 2 diabetes mellitus (HCC)   Chronic Hyperlipidemia associated with diabetes. Managed with lovastatin . - Continue lovastatin  40 mg daily        Nervous and Auditory   Mild cognitive disorder   Benign head tremor     Genitourinary   Chronic kidney disease (CKD), stage III (moderate) (HCC)   Chronic kidney disease stage 3B with last GFR of 35 and creatinine of 1.54. Requires monitoring of kidney function. - Order basic metabolic panel to check kidney function and potassium levels - f/u as scheduled with nephrology       Acute cystitis   Chronic cystitis managed with trimethoprim . - Continue trimethoprim  100 mg daily        Other   Claudication of both lower extremities (HCC)   B12 deficiency   Chronic vitamin B12 deficiency. Requires updated B12 levels. - Order updated vitamin B12 levels - Continue vitamin B12 1000 mcg daily      Relevant Orders   Vitamin B12   Anxiety   Chronic, stable Anxiety disorder managed with duloxetine . -  Continue duloxetine  60 mg once daily      Other Visit Diagnoses       Unintentional weight loss    -  Primary     Hypocalcemia       Relevant  Orders   BMP8+EGFR       Assessment & Plan   Unintentional weight loss Unintentional weight loss with recent weight gain of 2 pounds. Current weight is 148 pounds. Reports feeling less fatigue. Recommended caloric intake and protein intake to support weight maintenance. - Encourage intake of 1986 kcal/day and 60 g/kg of protein per day - Advise small frequent meals and favorite foods - Use nutritional supplements like Boost or Glycine between meals    Cellulitis of right foot Cellulitis of right foot treated with Keflex . Reports improvement with reduced redness and pain. No diarrhea reported from antibiotic use. - Continue Keflex  500 mg three times daily until completion     Return in about 3 months (around 12/27/2023) for CHRONIC F/U.         Mimi Alt, MD  [facility] [phone] (phone) [phone] (fax)  [facility]

## 2023-10-08 NOTE — Assessment & Plan Note (Signed)
 Peripheral arterial disease with claudication. Reports intermittent swelling and tightness in left leg. Awaiting vascular appointment. Elevation and compression do not significantly reduce swelling. - Encourage elevation and compression for left leg swelling - Continue gabapentin  100 mg three times daily for leg pain

## 2023-10-08 NOTE — Assessment & Plan Note (Signed)
 Chronic kidney disease stage 3B with last GFR of 35 and creatinine of 1.54. Requires monitoring of kidney function. - Order basic metabolic panel to check kidney function and potassium levels - f/u as scheduled with nephrology

## 2023-10-08 NOTE — Assessment & Plan Note (Signed)
 Type 2 diabetes mellitus with diabetic retinopathy Type 2 diabetes with diabetic retinopathy. Requires updated A1c for diabetes management. Currently on glargine and metformin . Reports no concerns about weight loss and feels less fatigue. Ozempic was discontinued due to previous issues. - Order updated A1c - Continue glargine 24 units daily - Continue metformin  500 mg twice daily - holding ozempic due to SE and excessive weight loss

## 2023-10-08 NOTE — Assessment & Plan Note (Signed)
 Chronic vitamin B12 deficiency. Requires updated B12 levels. - Order updated vitamin B12 levels - Continue vitamin B12 1000 mcg daily

## 2023-10-08 NOTE — Assessment & Plan Note (Signed)
 Chronic, stable Anxiety disorder managed with duloxetine . - Continue duloxetine  60 mg once daily

## 2023-10-08 NOTE — Assessment & Plan Note (Signed)
 Chronic  Hypertension managed with carvedilol . Current blood pressure is 141/58. - Continue carvedilol  12.5 mg twice daily

## 2023-10-08 NOTE — Assessment & Plan Note (Signed)
 Chronic cystitis managed with trimethoprim . - Continue trimethoprim  100 mg daily

## 2023-10-08 NOTE — Assessment & Plan Note (Signed)
 Chronic, stable  Gastroesophageal reflux disease managed with Protonix . - Continue Protonix  40 mg daily

## 2023-10-08 NOTE — Assessment & Plan Note (Signed)
 Chronic Hyperlipidemia associated with diabetes. Managed with lovastatin . - Continue lovastatin  40 mg daily

## 2023-10-08 NOTE — Assessment & Plan Note (Signed)
 Chronic Coronary artery disease managed with Plavix  and Imdur . Reports no chest pain. - Continue Plavix  75 mg daily - Continue Imdur  30 mg daily - Use nitroglycerin  as needed for chest pain

## 2023-10-09 LAB — BMP8+EGFR
BUN/Creatinine Ratio: 13 (ref 12–28)
BUN: 20 mg/dL (ref 8–27)
CO2: 14 mmol/L — ABNORMAL LOW (ref 20–29)
Calcium: 7.6 mg/dL — ABNORMAL LOW (ref 8.7–10.3)
Chloride: 110 mmol/L — ABNORMAL HIGH (ref 96–106)
Creatinine, Ser: 1.49 mg/dL — ABNORMAL HIGH (ref 0.57–1.00)
Glucose: 123 mg/dL — ABNORMAL HIGH (ref 70–99)
Potassium: 4.3 mmol/L (ref 3.5–5.2)
Sodium: 142 mmol/L (ref 134–144)
eGFR: 36 mL/min/{1.73_m2} — ABNORMAL LOW (ref >59)

## 2023-10-09 LAB — HEMOGLOBIN A1C
Est. average glucose Bld gHb Est-mCnc: 131 mg/dL
Hgb A1c MFr Bld: 6.2 % — ABNORMAL HIGH (ref 4.8–5.6)

## 2023-10-09 LAB — VITAMIN B12: Vitamin B-12: 680 pg/mL (ref 232–1245)

## 2023-10-09 NOTE — Telephone Encounter (Signed)
 Copied from CRM [id number]. Topic: Appointments - Appointment Scheduling >> Oct 09, 2023  9:59 AM [place] A wrote: Patient was not satisfied with available appointment times and would like for CAL to call her

## 2023-10-11 NOTE — Telephone Encounter (Signed)
 Spoke with Anaelle and she did not leave a message for to call her.   She is ok with seeing Dr. Prescilla Brod on 10/18/23.

## 2023-10-12 ENCOUNTER — Other Ambulatory Visit (INDEPENDENT_AMBULATORY_CARE_PROVIDER_SITE_OTHER): Payer: Self-pay | Admitting: Vascular Surgery

## 2023-10-15 ENCOUNTER — Ambulatory Visit
Admission: RE | Admit: 2023-10-15 | Discharge: 2023-10-15 | Disposition: A | Discharge status: Home / Self Care | Source: Ambulatory Visit | Attending: Family Medicine | Admitting: Family Medicine

## 2023-10-15 DIAGNOSIS — Z1231 Encounter for screening mammogram for malignant neoplasm of breast: Secondary | ICD-10-CM | POA: Diagnosis not present

## 2023-10-17 ENCOUNTER — Encounter: Payer: Self-pay | Admitting: Family Medicine

## 2023-10-18 ENCOUNTER — Ambulatory Visit (INDEPENDENT_AMBULATORY_CARE_PROVIDER_SITE_OTHER): Admitting: Vascular Surgery

## 2023-10-18 ENCOUNTER — Ambulatory Visit (INDEPENDENT_AMBULATORY_CARE_PROVIDER_SITE_OTHER)

## 2023-10-18 VITALS — BP 128/70 | HR 86 | Resp 17 | Ht 61.0 in | Wt 151.0 lb

## 2023-10-18 DIAGNOSIS — I10 Essential (primary) hypertension: Secondary | ICD-10-CM | POA: Diagnosis not present

## 2023-10-18 DIAGNOSIS — I70213 Atherosclerosis of native arteries of extremities with intermittent claudication, bilateral legs: Secondary | ICD-10-CM

## 2023-10-18 DIAGNOSIS — E1169 Type 2 diabetes mellitus with other specified complication: Secondary | ICD-10-CM | POA: Diagnosis not present

## 2023-10-18 DIAGNOSIS — E785 Hyperlipidemia, unspecified: Secondary | ICD-10-CM

## 2023-10-18 DIAGNOSIS — I2510 Atherosclerotic heart disease of native coronary artery without angina pectoris: Secondary | ICD-10-CM | POA: Diagnosis not present

## 2023-10-18 DIAGNOSIS — I2583 Coronary atherosclerosis due to lipid rich plaque: Secondary | ICD-10-CM | POA: Diagnosis not present

## 2023-10-19 LAB — VAS US ABI WITH/WO TBI
Left ABI: 1.11
Right ABI: 0.58

## 2023-10-21 ENCOUNTER — Encounter (INDEPENDENT_AMBULATORY_CARE_PROVIDER_SITE_OTHER): Payer: Self-pay | Admitting: Vascular Surgery

## 2023-10-21 NOTE — H&P (View-Only) (Signed)
 MRN : [id number]  Cynthia Sims is a 73 y.o. (Aug 02, 1950) female who presents with chief complaint of check circulation.  History of Present Illness:   The patient returns to the office for followup and review status post angiogram with intervention on 10/31/2022.    Procedure: Percutaneous transluminal angioplasty and stent placement left superficial femoral artery and popliteal    status post angiogram with intervention on 10/24/2022:   Procedure: Percutaneous transluminal angioplasty and stent placement left superficial femoral artery and popliteal    The patient notes improvement in the lower extremity symptoms. No interval shortening of the patient's claudication distance or rest pain symptoms. No new ulcers or wounds have occurred since the last visit.   There have been no significant changes to the patient's overall health care.   No documented history of amaurosis fugax or recent TIA symptoms. There are no recent neurological changes noted. No documented history of DVT, PE or superficial thrombophlebitis. The patient denies recent episodes of angina or shortness of breath.    ABI's Rt=1.04 and Lt=1.06  (previous ABI's Rt=1.20 and Lt=1.18) Duplex US  of the bilateral lower extremity arterial system shows SFA stents are patent bilaterally with uniform velocities throughout the femoral-popliteal arteries.  Current Meds  Medication Sig   acetaminophen  (TYLENOL ) 500 MG tablet Take 500 mg by mouth every 6 (six) hours as needed.    aspirin  EC 81 MG tablet Take 81 mg by mouth daily.    carvedilol  (COREG ) 25 MG tablet Take 0.5 tablets (12.5 mg total) by mouth 2 (two) times daily.   cholestyramine  (QUESTRAN ) 4 g packet DISSOLVE AND TAKE ONE PACKET BY MOUTH THREE TIMES DAILY   clopidogrel  (PLAVIX ) 75 MG tablet Take 1 tablet by mouth once daily   cyanocobalamin  1000 MCG tablet Take 1,000 mcg by mouth daily.    DULoxetine  (CYMBALTA ) 60 MG capsule Take 1 capsule by mouth once daily   gabapentin  (NEURONTIN ) 100 MG capsule TAKE 1 CAPSULE BY MOUTH THREE TIMES DAILY   GVOKE HYPOPEN 2-PACK 1 MG/0.2ML SOAJ Inject 1 mg as directed as directed.  INJECT 1 PEN IN CASE OF SEVERE HYPOGLYCEMIA   Insulin  Glargine (BASAGLAR  KWIKPEN) 100 UNIT/ML INJECT 24 UNITS SUBCUTANEOUSLY ONCE DAILY   isosorbide  mononitrate (IMDUR ) 30 MG 24 hr tablet Take 1 tablet by mouth once daily   lovastatin  (MEVACOR ) 40 MG tablet Take 1 tablet by mouth once daily   metFORMIN  (GLUCOPHAGE ) 500 MG tablet TAKE 1 TABLET BY MOUTH TWICE DAILY WITH A MEAL   nitroGLYCERIN  (NITROSTAT ) 0.4 MG SL tablet Place 1 tablet (0.4 mg total) under the tongue every 5 (five) minutes as needed for chest pain.   ondansetron  (ZOFRAN -ODT) 4 MG disintegrating tablet Take 1 tablet (4 mg total) by mouth every 8 (eight) hours as needed for nausea or vomiting.   pantoprazole  (PROTONIX ) 40 MG tablet Take 1 tablet by mouth once daily   Semaglutide,0.25 or 0.5MG /DOS, 2 MG/3ML SOPN Inject into the skin.   trimethoprim  (TRIMPEX ) 100 MG tablet Take 1 tablet (100 mg total) by mouth daily.    Past Medical History:  Diagnosis Date  ALT (SGPT) level raised    Anemia    Anxiety    Arthritis    Depression    Diabetes mellitus without complication (HCC)    Elevated cholesterol    GERD (gastroesophageal reflux disease)    Hemorrhoids    Hypertension    Kidney disease     Past Surgical History:  Procedure Laterality Date   abdominal blockage     CESAREAN SECTION  1983   CESAREAN SECTION     CHOLECYSTECTOMY     COLONOSCOPY WITH PROPOFOL  N/A 10/29/2015   Procedure: COLONOSCOPY WITH PROPOFOL ;  Surgeon: Cassie Click, MD;  Location: [facility] ENDOSCOPY;  Service: Endoscopy;  Laterality: N/A;   COLONOSCOPY WITH PROPOFOL  N/A 11/07/2017   Procedure: COLONOSCOPY WITH PROPOFOL ;  Surgeon: Toledo, Alphonsus Jeans, MD;  Location: [facility] ENDOSCOPY;  Service: Gastroenterology;  Laterality: N/A;    DILATION AND CURETTAGE OF UTERUS  2001   ESOPHAGOGASTRODUODENOSCOPY (EGD) WITH PROPOFOL  N/A 10/29/2015   Procedure: ESOPHAGOGASTRODUODENOSCOPY (EGD) WITH PROPOFOL ;  Surgeon: Cassie Click, MD;  Location: [facility] ENDOSCOPY;  Service: Endoscopy;  Laterality: N/A;   LOWER EXTREMITY ANGIOGRAPHY Right 10/24/2022   Procedure: Lower Extremity Angiography;  Surgeon: Jackquelyn Mass, MD;  Location: [facility] INVASIVE CV LAB;  Service: Cardiovascular;  Laterality: Right;   LOWER EXTREMITY ANGIOGRAPHY Left 10/31/2022   Procedure: Lower Extremity Angiography;  Surgeon: Jackquelyn Mass, MD;  Location: [facility] INVASIVE CV LAB;  Service: Cardiovascular;  Laterality: Left;    Social History Social History   Tobacco Use   Smoking status: Never    Passive exposure: Never   Smokeless tobacco: Never  Vaping Use   Vaping status: Never Used  Substance Use Topics   Alcohol use: Not Currently   Drug use: Never    Family History Family History  Problem Relation Age of Onset   Alzheimer's disease Mother    COPD Mother    Breast cancer Mother 12   Osteoporosis Mother    Heart attack Father    Lupus Father    Diabetes Father    Hypertension Father    Diabetes Sister    COPD Maternal Grandmother    Diabetes Paternal Grandfather    Diabetes Sister    Diabetes Sister     No Known Allergies   REVIEW OF SYSTEMS (Negative unless checked)  Constitutional: [] Weight loss  [] Fever  [] Chills Cardiac: [] Chest pain   [] Chest pressure   [] Palpitations   [] Shortness of breath when laying flat   [] Shortness of breath with exertion. Vascular:  [x] Pain in legs with walking   [] Pain in legs at rest  [] History of DVT   [] Phlebitis   [] Swelling in legs   [] Varicose veins   [] Non-healing ulcers Pulmonary:   [] Uses home oxygen   [] Productive cough   [] Hemoptysis   [] Wheeze  [] COPD   [] Asthma Neurologic:  [] Dizziness   [] Seizures   [] History of stroke   [] History of TIA  [] Aphasia   [] Vissual changes   [] Weakness or  numbness in arm   [] Weakness or numbness in leg Musculoskeletal:   [] Joint swelling   [] Joint pain   [] Low back pain Hematologic:  [] Easy bruising  [] Easy bleeding   [] Hypercoagulable state   [] Anemic Gastrointestinal:  [] Diarrhea   [] Vomiting  [] Gastroesophageal reflux/heartburn   [] Difficulty swallowing. Genitourinary:  [] Chronic kidney disease   [] Difficult urination  [] Frequent urination   [] Blood in urine Skin:  [] Rashes   [] Ulcers  Psychological:  [] History of anxiety   []  History of major depression.  Physical  Examination  Vitals:   10/18/23 1440  BP: 128/70  Pulse: 86  Resp: 17  Weight: 151 lb (68.5 kg)  Height: 5\' 1"  (1.549 m)   Body mass index is 28.53 kg/m. Gen: WD/WN, NAD Head: [place]/AT, No temporalis wasting.  Ear/Nose/Throat: Hearing grossly intact, nares w/o erythema or drainage Eyes: PER, EOMI, sclera nonicteric.  Neck: Supple, no masses.  No bruit or JVD.  Pulmonary:  Good air movement, no audible wheezing, no use of accessory muscles.  Cardiac: RRR, normal S1, S2, no Murmurs. Vascular:  mild trophic changes, no open wounds Vessel Right Left  Radial Palpable Palpable  PT Not Palpable Not Palpable  DP Not Palpable Not Palpable  Gastrointestinal: soft, non-distended. No guarding/no peritoneal signs.  Musculoskeletal: M/S 5/5 throughout.  No visible deformity.  Neurologic: CN 2-12 intact. Pain and light touch intact in extremities.  Symmetrical.  Speech is fluent. Motor exam as listed above. Psychiatric: Judgment intact, Mood & affect appropriate for pt's clinical situation. Dermatologic: No rashes or ulcers noted.  No changes consistent with cellulitis.   CBC Lab Results  Component Value Date   WBC 7.1 05/28/2023   HGB 10.7 (L) 05/28/2023   HCT 32.7 (L) 05/28/2023   MCV 91 05/28/2023   PLT 199 05/28/2023    BMET    Component Value Date/Time   NA 142 10/08/2023 0908   NA 135 (L) 03/01/2012 2236   K 4.3 10/08/2023 0908   K 4.4 03/01/2012 2236   CL 110  (H) 10/08/2023 0908   CL 103 03/01/2012 2236   CO2 14 (L) 10/08/2023 0908   CO2 18 (L) 03/01/2012 2236   GLUCOSE 123 (H) 10/08/2023 0908   GLUCOSE 120 (H) 05/21/2023 0451   GLUCOSE 494 (H) 03/01/2012 2236   BUN 20 10/08/2023 0908   BUN 23 (H) 03/01/2012 2236   CREATININE 1.49 (H) 10/08/2023 0908   CREATININE 1.72 (H) 03/01/2012 2236   CALCIUM 7.6 (L) 10/08/2023 0908   CALCIUM 8.3 (L) 03/01/2012 2236   GFRNONAA 41 (L) 05/21/2023 0451   GFRNONAA 31 (L) 03/01/2012 2236   GFRAA 41 (L) 10/06/2019 1150   GFRAA 36 (L) 03/01/2012 2236   Estimated Creatinine Clearance: 28.9 mL/min (A) (by C-G formula based on SCr of 1.49 mg/dL (H)).  COAG No results found for: "INR", "PROTIME"  Radiology VAS US  ABI WITH/WO TBI Result Date: 10/19/2023  LOWER EXTREMITY DOPPLER STUDY Patient Name:  Cynthia Sims  Date of Exam:   10/18/2023 Medical Rec #: [id number]        Accession #:    [id number] Date of Birth: June 04, 1951        Patient Gender: F Patient Age:   26 years Exam Location:  [facility] Procedure:      VAS US  ABI WITH/WO TBI Referring Phys: [facility] --------------------------------------------------------------------------------  Indications: Claudication, rest pain, and peripheral artery disease. High Risk Factors: Hypertension, no history of smoking.  Vascular Interventions: 10/31/2022 Left SFA Pop stent                          10/24/2019 Right SFA stent. Performing Technologist: Oneta Bilberry RVT  Examination Guidelines: A complete evaluation includes at minimum, Doppler waveform signals and systolic blood pressure reading at the level of bilateral brachial, anterior tibial, and posterior tibial arteries, when vessel segments are accessible. Bilateral testing is considered an integral part of a complete examination. Photoelectric Plethysmograph (PPG) waveforms and toe systolic pressure readings are included as  required and additional duplex testing as needed. Limited examinations for  reoccurring indications may be performed as noted.  ABI Findings: +---------+------------------+-----+----------+--------+ Right    Rt Pressure (mmHg)IndexWaveform  Comment  +---------+------------------+-----+----------+--------+ Brachial 151                                       +---------+------------------+-----+----------+--------+ PTA      88                0.58 monophasic         +---------+------------------+-----+----------+--------+ DP       84                0.56 monophasic         +---------+------------------+-----+----------+--------+ Great Toe52                0.34                    +---------+------------------+-----+----------+--------+ +---------+------------------+-----+---------+-------+ Left     Lt Pressure (mmHg)IndexWaveform Comment +---------+------------------+-----+---------+-------+ Brachial 148                                     +---------+------------------+-----+---------+-------+ PTA      167               1.11 triphasic        +---------+------------------+-----+---------+-------+ DP       141               0.93 biphasic         +---------+------------------+-----+---------+-------+ Great Toe125               0.83                  +---------+------------------+-----+---------+-------+ +-------+-----------+-----------+------------+------------+ ABI/TBIToday's ABIToday's TBIPrevious ABIPrevious TBI +-------+-----------+-----------+------------+------------+ Right  0.58       0.34       1.09        0.94         +-------+-----------+-----------+------------+------------+ Left   1.11       0.83       1.06        1.01         +-------+-----------+-----------+------------+------------+  Right ABIs appear decreased compared to prior study on 09/06/20224. Left ABIs appear essentially unchanged compared to prior study on 02/16/2023.  Summary: Right: Resting right ankle-brachial index indicates moderate right lower  extremity arterial disease. The right toe-brachial index is abnormal. Left: Resting left ankle-brachial index is within normal range. The left toe-brachial index is normal. *See table(s) above for measurements and observations.  Electronically signed by Devon Fogo MD on 10/19/2023 at 11:25:34 AM.    Final    VAS US  LOWER EXTREMITY ARTERIAL DUPLEX Result Date: 10/19/2023 LOWER EXTREMITY ARTERIAL DUPLEX STUDY Patient Name:  Cynthia Sims  Date of Exam:   10/18/2023 Medical Rec #: [id number]        Accession #:    [id number] Date of Birth: September 01, 1950        Patient Gender: F Patient Age:   26 years Exam Location:  [facility] Procedure:      VAS US  LOWER EXTREMITY ARTERIAL DUPLEX Referring Phys: Devon Fogo --------------------------------------------------------------------------------  Indications: Claudication, rest pain, and peripheral artery disease. High Risk Factors: Hypertension, Diabetes, no history of smoking.  Vascular Interventions: 10/31/2022 Left SFA  stent                          10/24/2019 Right SFA pop stent. Current ABI:            Right: 0.58, Left: 1.11 Performing Technologist: Oneta Bilberry RVT  Examination Guidelines: A complete evaluation includes B-mode imaging, spectral Doppler, color Doppler, and power Doppler as needed of all accessible portions of each vessel. Bilateral testing is considered an integral part of a complete examination. Limited examinations for reoccurring indications may be performed as noted.  +----------+--------+-----+--------+----------+--------+ RIGHT     PSV cm/sRatioStenosisWaveform  Comments +----------+--------+-----+--------+----------+--------+ CFA Distal125                  triphasic          +----------+--------+-----+--------+----------+--------+ DFA       153                  biphasic           +----------+--------+-----+--------+----------+--------+ SFA Prox  0            occluded          stent     +----------+--------+-----+--------+----------+--------+ SFA Mid   0            occluded                   +----------+--------+-----+--------+----------+--------+ SFA Distal35                   monophasic         +----------+--------+-----+--------+----------+--------+ POP Prox  144                  monophasic         +----------+--------+-----+--------+----------+--------+ POP Distal83                   monophasic         +----------+--------+-----+--------+----------+--------+ ATA Distal49                   monophasic         +----------+--------+-----+--------+----------+--------+ PTA Distal44                   monophasic         +----------+--------+-----+--------+----------+--------+  +----------+--------+-----+---------------+--------+--------+ LEFT      PSV cm/sRatioStenosis       WaveformComments +----------+--------+-----+---------------+--------+--------+ CFA Distal205                         biphasic         +----------+--------+-----+---------------+--------+--------+ DFA       132                         biphasic         +----------+--------+-----+---------------+--------+--------+ SFA Prox  358          30-49% stenosisbiphasic         +----------+--------+-----+---------------+--------+--------+ SFA Mid   95                          biphasic         +----------+--------+-----+---------------+--------+--------+ SFA Distal165                         biphasic         +----------+--------+-----+---------------+--------+--------+ POP Prox  122  biphasic         +----------+--------+-----+---------------+--------+--------+ POP Distal147                         biphasic         +----------+--------+-----+---------------+--------+--------+ ATA Distal58                          biphasic         +----------+--------+-----+---------------+--------+--------+ PTA Distal72                           biphasic         +----------+--------+-----+---------------+--------+--------+  Left Stent(s): +----------------+--------+--------+--------+--------+ Popliteal arteryPSV cm/sStenosisWaveformComments +----------------+--------+--------+--------+--------+ Prox to Stent   141             biphasic         +----------------+--------+--------+--------+--------+ Proximal Stent  136             biphasic         +----------------+--------+--------+--------+--------+ Mid Stent       151             biphasic         +----------------+--------+--------+--------+--------+ Distal Stent    122             biphasic         +----------------+--------+--------+--------+--------+ Distal to Stent 112             biphasic         +----------------+--------+--------+--------+--------+    Summary: Right: Total occlusion noted in the superficial femoral artery. Stenosis is noted within the Proximal SFA stent. Left: 30-49% stenosis noted in the superficial femoral artery. Patent stent with no evidence of stenosis in the popliteal artery artery.  See table(s) above for measurements and observations. Electronically signed by Devon Fogo MD on 10/19/2023 at 11:24:43 AM.    Final    MM 3D SCREENING MAMMOGRAM BILATERAL BREAST Result Date: 10/17/2023 CLINICAL DATA:  Screening. EXAM: DIGITAL SCREENING BILATERAL MAMMOGRAM WITH TOMOSYNTHESIS AND CAD TECHNIQUE: Bilateral screening digital craniocaudal and mediolateral oblique mammograms were obtained. Bilateral screening digital breast tomosynthesis was performed. The images were evaluated with computer-aided detection. COMPARISON:  Previous exam(s). ACR Breast Density Category b: There are scattered areas of fibroglandular density. FINDINGS: There are no findings suspicious for malignancy. IMPRESSION: No mammographic evidence of malignancy. A result letter of this screening mammogram will be mailed directly to the patient. RECOMMENDATION:  Screening mammogram in one year. (Code:SM-B-01Y) BI-RADS CATEGORY  1: Negative. Electronically Signed   By: Dina  Arceo M.D.   On: 10/17/2023 10:21     Assessment/Plan 1. Atherosclerosis of native artery of both lower extremities with intermittent claudication (HCC) (Primary)  Recommend:  The patient has evidence of atherosclerosis of the lower extremities with claudication.  The patient does not voice lifestyle limiting changes at this point in time.  Noninvasive studies do not suggest clinically significant change.  No invasive studies, angiography or surgery at this time The patient should continue walking and begin a more formal exercise program.  The patient should continue antiplatelet therapy and aggressive treatment of the lipid abnormalities  No changes in the patient's medications at this time  Continued surveillance is indicated as atherosclerosis is likely to progress with time.    The patient will continue follow up with noninvasive studies as ordered.  - VAS US  ABI WITH/WO TBI; Future  2. Essential hypertension Continue antihypertensive medications as  already ordered, these medications have been reviewed and there are no changes at this time.  3. Coronary artery disease due to lipid rich plaque Continue cardiac and antihypertensive medications as already ordered and reviewed, no changes at this time.  Continue statin as ordered and reviewed, no changes at this time  Nitrates PRN for chest pain  4. Hyperlipidemia associated with type 2 diabetes mellitus (HCC) Continue statin as ordered and reviewed, no changes at this time  5. Type 2 diabetes mellitus with hyperlipidemia (HCC) Continue hypoglycemic medications as already ordered, these medications have been reviewed and there are no changes at this time.  Hgb A1C to be monitored as already arranged by primary service    Devon Fogo, MD  10/21/2023 12:30 PM

## 2023-10-21 NOTE — Progress Notes (Addendum)
 MRN : [id number]  Cynthia Sims is a 73 y.o. (Aug 02, 1950) female who presents with chief complaint of check circulation.  History of Present Illness:   The patient returns to the office for followup and review status post angiogram with intervention on 10/31/2022.    Procedure: Percutaneous transluminal angioplasty and stent placement left superficial femoral artery and popliteal    status post angiogram with intervention on 10/24/2022:   Procedure: Percutaneous transluminal angioplasty and stent placement left superficial femoral artery and popliteal    The patient notes improvement in the lower extremity symptoms. No interval shortening of the patient's claudication distance or rest pain symptoms. No new ulcers or wounds have occurred since the last visit.   There have been no significant changes to the patient's overall health care.   No documented history of amaurosis fugax or recent TIA symptoms. There are no recent neurological changes noted. No documented history of DVT, PE or superficial thrombophlebitis. The patient denies recent episodes of angina or shortness of breath.    ABI's Rt=1.04 and Lt=1.06  (previous ABI's Rt=1.20 and Lt=1.18) Duplex US  of the bilateral lower extremity arterial system shows SFA stents are patent bilaterally with uniform velocities throughout the femoral-popliteal arteries.  Current Meds  Medication Sig   acetaminophen  (TYLENOL ) 500 MG tablet Take 500 mg by mouth every 6 (six) hours as needed.    aspirin  EC 81 MG tablet Take 81 mg by mouth daily.    carvedilol  (COREG ) 25 MG tablet Take 0.5 tablets (12.5 mg total) by mouth 2 (two) times daily.   cholestyramine  (QUESTRAN ) 4 g packet DISSOLVE AND TAKE ONE PACKET BY MOUTH THREE TIMES DAILY   clopidogrel  (PLAVIX ) 75 MG tablet Take 1 tablet by mouth once daily   cyanocobalamin  1000 MCG tablet Take 1,000 mcg by mouth daily.    DULoxetine  (CYMBALTA ) 60 MG capsule Take 1 capsule by mouth once daily   gabapentin  (NEURONTIN ) 100 MG capsule TAKE 1 CAPSULE BY MOUTH THREE TIMES DAILY   GVOKE HYPOPEN 2-PACK 1 MG/0.2ML SOAJ Inject 1 mg as directed as directed.  INJECT 1 PEN IN CASE OF SEVERE HYPOGLYCEMIA   Insulin  Glargine (BASAGLAR  KWIKPEN) 100 UNIT/ML INJECT 24 UNITS SUBCUTANEOUSLY ONCE DAILY   isosorbide  mononitrate (IMDUR ) 30 MG 24 hr tablet Take 1 tablet by mouth once daily   lovastatin  (MEVACOR ) 40 MG tablet Take 1 tablet by mouth once daily   metFORMIN  (GLUCOPHAGE ) 500 MG tablet TAKE 1 TABLET BY MOUTH TWICE DAILY WITH A MEAL   nitroGLYCERIN  (NITROSTAT ) 0.4 MG SL tablet Place 1 tablet (0.4 mg total) under the tongue every 5 (five) minutes as needed for chest pain.   ondansetron  (ZOFRAN -ODT) 4 MG disintegrating tablet Take 1 tablet (4 mg total) by mouth every 8 (eight) hours as needed for nausea or vomiting.   pantoprazole  (PROTONIX ) 40 MG tablet Take 1 tablet by mouth once daily   Semaglutide,0.25 or 0.5MG /DOS, 2 MG/3ML SOPN Inject into the skin.   trimethoprim  (TRIMPEX ) 100 MG tablet Take 1 tablet (100 mg total) by mouth daily.    Past Medical History:  Diagnosis Date  ALT (SGPT) level raised    Anemia    Anxiety    Arthritis    Depression    Diabetes mellitus without complication (HCC)    Elevated cholesterol    GERD (gastroesophageal reflux disease)    Hemorrhoids    Hypertension    Kidney disease     Past Surgical History:  Procedure Laterality Date   abdominal blockage     CESAREAN SECTION  1983   CESAREAN SECTION     CHOLECYSTECTOMY     COLONOSCOPY WITH PROPOFOL  N/A 10/29/2015   Procedure: COLONOSCOPY WITH PROPOFOL ;  Surgeon: Cassie Click, MD;  Location: [facility] ENDOSCOPY;  Service: Endoscopy;  Laterality: N/A;   COLONOSCOPY WITH PROPOFOL  N/A 11/07/2017   Procedure: COLONOSCOPY WITH PROPOFOL ;  Surgeon: Toledo, Alphonsus Jeans, MD;  Location: [facility] ENDOSCOPY;  Service: Gastroenterology;  Laterality: N/A;    DILATION AND CURETTAGE OF UTERUS  2001   ESOPHAGOGASTRODUODENOSCOPY (EGD) WITH PROPOFOL  N/A 10/29/2015   Procedure: ESOPHAGOGASTRODUODENOSCOPY (EGD) WITH PROPOFOL ;  Surgeon: Cassie Click, MD;  Location: [facility] ENDOSCOPY;  Service: Endoscopy;  Laterality: N/A;   LOWER EXTREMITY ANGIOGRAPHY Right 10/24/2022   Procedure: Lower Extremity Angiography;  Surgeon: Jackquelyn Mass, MD;  Location: [facility] INVASIVE CV LAB;  Service: Cardiovascular;  Laterality: Right;   LOWER EXTREMITY ANGIOGRAPHY Left 10/31/2022   Procedure: Lower Extremity Angiography;  Surgeon: Jackquelyn Mass, MD;  Location: [facility] INVASIVE CV LAB;  Service: Cardiovascular;  Laterality: Left;    Social History Social History   Tobacco Use   Smoking status: Never    Passive exposure: Never   Smokeless tobacco: Never  Vaping Use   Vaping status: Never Used  Substance Use Topics   Alcohol use: Not Currently   Drug use: Never    Family History Family History  Problem Relation Age of Onset   Alzheimer's disease Mother    COPD Mother    Breast cancer Mother 12   Osteoporosis Mother    Heart attack Father    Lupus Father    Diabetes Father    Hypertension Father    Diabetes Sister    COPD Maternal Grandmother    Diabetes Paternal Grandfather    Diabetes Sister    Diabetes Sister     No Known Allergies   REVIEW OF SYSTEMS (Negative unless checked)  Constitutional: [] Weight loss  [] Fever  [] Chills Cardiac: [] Chest pain   [] Chest pressure   [] Palpitations   [] Shortness of breath when laying flat   [] Shortness of breath with exertion. Vascular:  [x] Pain in legs with walking   [] Pain in legs at rest  [] History of DVT   [] Phlebitis   [] Swelling in legs   [] Varicose veins   [] Non-healing ulcers Pulmonary:   [] Uses home oxygen   [] Productive cough   [] Hemoptysis   [] Wheeze  [] COPD   [] Asthma Neurologic:  [] Dizziness   [] Seizures   [] History of stroke   [] History of TIA  [] Aphasia   [] Vissual changes   [] Weakness or  numbness in arm   [] Weakness or numbness in leg Musculoskeletal:   [] Joint swelling   [] Joint pain   [] Low back pain Hematologic:  [] Easy bruising  [] Easy bleeding   [] Hypercoagulable state   [] Anemic Gastrointestinal:  [] Diarrhea   [] Vomiting  [] Gastroesophageal reflux/heartburn   [] Difficulty swallowing. Genitourinary:  [] Chronic kidney disease   [] Difficult urination  [] Frequent urination   [] Blood in urine Skin:  [] Rashes   [] Ulcers  Psychological:  [] History of anxiety   []  History of major depression.  Physical  Examination  Vitals:   10/18/23 1440  BP: 128/70  Pulse: 86  Resp: 17  Weight: 151 lb (68.5 kg)  Height: 5\' 1"  (1.549 m)   Body mass index is 28.53 kg/m. Gen: WD/WN, NAD Head: [place]/AT, No temporalis wasting.  Ear/Nose/Throat: Hearing grossly intact, nares w/o erythema or drainage Eyes: PER, EOMI, sclera nonicteric.  Neck: Supple, no masses.  No bruit or JVD.  Pulmonary:  Good air movement, no audible wheezing, no use of accessory muscles.  Cardiac: RRR, normal S1, S2, no Murmurs. Vascular:  mild trophic changes, no open wounds Vessel Right Left  Radial Palpable Palpable  PT Not Palpable Not Palpable  DP Not Palpable Not Palpable  Gastrointestinal: soft, non-distended. No guarding/no peritoneal signs.  Musculoskeletal: M/S 5/5 throughout.  No visible deformity.  Neurologic: CN 2-12 intact. Pain and light touch intact in extremities.  Symmetrical.  Speech is fluent. Motor exam as listed above. Psychiatric: Judgment intact, Mood & affect appropriate for pt's clinical situation. Dermatologic: No rashes or ulcers noted.  No changes consistent with cellulitis.   CBC Lab Results  Component Value Date   WBC 7.1 05/28/2023   HGB 10.7 (L) 05/28/2023   HCT 32.7 (L) 05/28/2023   MCV 91 05/28/2023   PLT 199 05/28/2023    BMET    Component Value Date/Time   NA 142 10/08/2023 0908   NA 135 (L) 03/01/2012 2236   K 4.3 10/08/2023 0908   K 4.4 03/01/2012 2236   CL 110  (H) 10/08/2023 0908   CL 103 03/01/2012 2236   CO2 14 (L) 10/08/2023 0908   CO2 18 (L) 03/01/2012 2236   GLUCOSE 123 (H) 10/08/2023 0908   GLUCOSE 120 (H) 05/21/2023 0451   GLUCOSE 494 (H) 03/01/2012 2236   BUN 20 10/08/2023 0908   BUN 23 (H) 03/01/2012 2236   CREATININE 1.49 (H) 10/08/2023 0908   CREATININE 1.72 (H) 03/01/2012 2236   CALCIUM 7.6 (L) 10/08/2023 0908   CALCIUM 8.3 (L) 03/01/2012 2236   GFRNONAA 41 (L) 05/21/2023 0451   GFRNONAA 31 (L) 03/01/2012 2236   GFRAA 41 (L) 10/06/2019 1150   GFRAA 36 (L) 03/01/2012 2236   Estimated Creatinine Clearance: 28.9 mL/min (A) (by C-G formula based on SCr of 1.49 mg/dL (H)).  COAG No results found for: "INR", "PROTIME"  Radiology VAS US  ABI WITH/WO TBI Result Date: 10/19/2023  LOWER EXTREMITY DOPPLER STUDY Patient Name:  Cynthia Sims  Date of Exam:   10/18/2023 Medical Rec #: [id number]        Accession #:    [id number] Date of Birth: June 04, 1951        Patient Gender: F Patient Age:   26 years Exam Location:  [facility] Procedure:      VAS US  ABI WITH/WO TBI Referring Phys: [facility] --------------------------------------------------------------------------------  Indications: Claudication, rest pain, and peripheral artery disease. High Risk Factors: Hypertension, no history of smoking.  Vascular Interventions: 10/31/2022 Left SFA Pop stent                          10/24/2019 Right SFA stent. Performing Technologist: Oneta Bilberry RVT  Examination Guidelines: A complete evaluation includes at minimum, Doppler waveform signals and systolic blood pressure reading at the level of bilateral brachial, anterior tibial, and posterior tibial arteries, when vessel segments are accessible. Bilateral testing is considered an integral part of a complete examination. Photoelectric Plethysmograph (PPG) waveforms and toe systolic pressure readings are included as  required and additional duplex testing as needed. Limited examinations for  reoccurring indications may be performed as noted.  ABI Findings: +---------+------------------+-----+----------+--------+ Right    Rt Pressure (mmHg)IndexWaveform  Comment  +---------+------------------+-----+----------+--------+ Brachial 151                                       +---------+------------------+-----+----------+--------+ PTA      88                0.58 monophasic         +---------+------------------+-----+----------+--------+ DP       84                0.56 monophasic         +---------+------------------+-----+----------+--------+ Great Toe52                0.34                    +---------+------------------+-----+----------+--------+ +---------+------------------+-----+---------+-------+ Left     Lt Pressure (mmHg)IndexWaveform Comment +---------+------------------+-----+---------+-------+ Brachial 148                                     +---------+------------------+-----+---------+-------+ PTA      167               1.11 triphasic        +---------+------------------+-----+---------+-------+ DP       141               0.93 biphasic         +---------+------------------+-----+---------+-------+ Great Toe125               0.83                  +---------+------------------+-----+---------+-------+ +-------+-----------+-----------+------------+------------+ ABI/TBIToday's ABIToday's TBIPrevious ABIPrevious TBI +-------+-----------+-----------+------------+------------+ Right  0.58       0.34       1.09        0.94         +-------+-----------+-----------+------------+------------+ Left   1.11       0.83       1.06        1.01         +-------+-----------+-----------+------------+------------+  Right ABIs appear decreased compared to prior study on 09/06/20224. Left ABIs appear essentially unchanged compared to prior study on 02/16/2023.  Summary: Right: Resting right ankle-brachial index indicates moderate right lower  extremity arterial disease. The right toe-brachial index is abnormal. Left: Resting left ankle-brachial index is within normal range. The left toe-brachial index is normal. *See table(s) above for measurements and observations.  Electronically signed by Devon Fogo MD on 10/19/2023 at 11:25:34 AM.    Final    VAS US  LOWER EXTREMITY ARTERIAL DUPLEX Result Date: 10/19/2023 LOWER EXTREMITY ARTERIAL DUPLEX STUDY Patient Name:  Cynthia Sims  Date of Exam:   10/18/2023 Medical Rec #: [id number]        Accession #:    [id number] Date of Birth: September 01, 1950        Patient Gender: F Patient Age:   26 years Exam Location:  [facility] Procedure:      VAS US  LOWER EXTREMITY ARTERIAL DUPLEX Referring Phys: Devon Fogo --------------------------------------------------------------------------------  Indications: Claudication, rest pain, and peripheral artery disease. High Risk Factors: Hypertension, Diabetes, no history of smoking.  Vascular Interventions: 10/31/2022 Left SFA  stent                          10/24/2019 Right SFA pop stent. Current ABI:            Right: 0.58, Left: 1.11 Performing Technologist: Oneta Bilberry RVT  Examination Guidelines: A complete evaluation includes B-mode imaging, spectral Doppler, color Doppler, and power Doppler as needed of all accessible portions of each vessel. Bilateral testing is considered an integral part of a complete examination. Limited examinations for reoccurring indications may be performed as noted.  +----------+--------+-----+--------+----------+--------+ RIGHT     PSV cm/sRatioStenosisWaveform  Comments +----------+--------+-----+--------+----------+--------+ CFA Distal125                  triphasic          +----------+--------+-----+--------+----------+--------+ DFA       153                  biphasic           +----------+--------+-----+--------+----------+--------+ SFA Prox  0            occluded          stent     +----------+--------+-----+--------+----------+--------+ SFA Mid   0            occluded                   +----------+--------+-----+--------+----------+--------+ SFA Distal35                   monophasic         +----------+--------+-----+--------+----------+--------+ POP Prox  144                  monophasic         +----------+--------+-----+--------+----------+--------+ POP Distal83                   monophasic         +----------+--------+-----+--------+----------+--------+ ATA Distal49                   monophasic         +----------+--------+-----+--------+----------+--------+ PTA Distal44                   monophasic         +----------+--------+-----+--------+----------+--------+  +----------+--------+-----+---------------+--------+--------+ LEFT      PSV cm/sRatioStenosis       WaveformComments +----------+--------+-----+---------------+--------+--------+ CFA Distal205                         biphasic         +----------+--------+-----+---------------+--------+--------+ DFA       132                         biphasic         +----------+--------+-----+---------------+--------+--------+ SFA Prox  358          30-49% stenosisbiphasic         +----------+--------+-----+---------------+--------+--------+ SFA Mid   95                          biphasic         +----------+--------+-----+---------------+--------+--------+ SFA Distal165                         biphasic         +----------+--------+-----+---------------+--------+--------+ POP Prox  122  biphasic         +----------+--------+-----+---------------+--------+--------+ POP Distal147                         biphasic         +----------+--------+-----+---------------+--------+--------+ ATA Distal58                          biphasic         +----------+--------+-----+---------------+--------+--------+ PTA Distal72                           biphasic         +----------+--------+-----+---------------+--------+--------+  Left Stent(s): +----------------+--------+--------+--------+--------+ Popliteal arteryPSV cm/sStenosisWaveformComments +----------------+--------+--------+--------+--------+ Prox to Stent   141             biphasic         +----------------+--------+--------+--------+--------+ Proximal Stent  136             biphasic         +----------------+--------+--------+--------+--------+ Mid Stent       151             biphasic         +----------------+--------+--------+--------+--------+ Distal Stent    122             biphasic         +----------------+--------+--------+--------+--------+ Distal to Stent 112             biphasic         +----------------+--------+--------+--------+--------+    Summary: Right: Total occlusion noted in the superficial femoral artery. Stenosis is noted within the Proximal SFA stent. Left: 30-49% stenosis noted in the superficial femoral artery. Patent stent with no evidence of stenosis in the popliteal artery artery.  See table(s) above for measurements and observations. Electronically signed by Devon Fogo MD on 10/19/2023 at 11:24:43 AM.    Final    MM 3D SCREENING MAMMOGRAM BILATERAL BREAST Result Date: 10/17/2023 CLINICAL DATA:  Screening. EXAM: DIGITAL SCREENING BILATERAL MAMMOGRAM WITH TOMOSYNTHESIS AND CAD TECHNIQUE: Bilateral screening digital craniocaudal and mediolateral oblique mammograms were obtained. Bilateral screening digital breast tomosynthesis was performed. The images were evaluated with computer-aided detection. COMPARISON:  Previous exam(s). ACR Breast Density Category b: There are scattered areas of fibroglandular density. FINDINGS: There are no findings suspicious for malignancy. IMPRESSION: No mammographic evidence of malignancy. A result letter of this screening mammogram will be mailed directly to the patient. RECOMMENDATION:  Screening mammogram in one year. (Code:SM-B-01Y) BI-RADS CATEGORY  1: Negative. Electronically Signed   By: Dina  Arceo M.D.   On: 10/17/2023 10:21     Assessment/Plan 1. Atherosclerosis of native artery of both lower extremities with intermittent claudication (HCC) (Primary)  Recommend:  The patient has evidence of atherosclerosis of the lower extremities with claudication.  The patient does not voice lifestyle limiting changes at this point in time.  Noninvasive studies do not suggest clinically significant change.  No invasive studies, angiography or surgery at this time The patient should continue walking and begin a more formal exercise program.  The patient should continue antiplatelet therapy and aggressive treatment of the lipid abnormalities  No changes in the patient's medications at this time  Continued surveillance is indicated as atherosclerosis is likely to progress with time.    The patient will continue follow up with noninvasive studies as ordered.  - VAS US  ABI WITH/WO TBI; Future  2. Essential hypertension Continue antihypertensive medications as  already ordered, these medications have been reviewed and there are no changes at this time.  3. Coronary artery disease due to lipid rich plaque Continue cardiac and antihypertensive medications as already ordered and reviewed, no changes at this time.  Continue statin as ordered and reviewed, no changes at this time  Nitrates PRN for chest pain  4. Hyperlipidemia associated with type 2 diabetes mellitus (HCC) Continue statin as ordered and reviewed, no changes at this time  5. Type 2 diabetes mellitus with hyperlipidemia (HCC) Continue hypoglycemic medications as already ordered, these medications have been reviewed and there are no changes at this time.  Hgb A1C to be monitored as already arranged by primary service    Devon Fogo, MD  10/21/2023 12:30 PM

## 2023-10-22 ENCOUNTER — Telehealth (INDEPENDENT_AMBULATORY_CARE_PROVIDER_SITE_OTHER): Payer: Self-pay

## 2023-10-22 NOTE — Telephone Encounter (Signed)
 Spoke with the patient and she is scheduled with Dr. Prescilla Brod on 10/23/23 with a 6:45 am arrival time to the [facility] for a right leg angio. Pre-procedure instructions were discussed and will be sent to Mychart.

## 2023-10-23 ENCOUNTER — Encounter: Payer: Self-pay | Admitting: Vascular Surgery

## 2023-10-23 ENCOUNTER — Other Ambulatory Visit: Payer: Self-pay

## 2023-10-23 ENCOUNTER — Ambulatory Visit
Admission: RE | Admit: 2023-10-23 | Discharge: 2023-10-23 | Disposition: A | Discharge status: Home / Self Care | Attending: Vascular Surgery | Admitting: Vascular Surgery

## 2023-10-23 ENCOUNTER — Encounter
Admission: RE | Disposition: A | Discharge status: Home / Self Care | Payer: Self-pay | Source: Home / Self Care | Attending: Vascular Surgery

## 2023-10-23 DIAGNOSIS — I70229 Atherosclerosis of native arteries of extremities with rest pain, unspecified extremity: Secondary | ICD-10-CM

## 2023-10-23 DIAGNOSIS — I152 Hypertension secondary to endocrine disorders: Secondary | ICD-10-CM | POA: Insufficient documentation

## 2023-10-23 DIAGNOSIS — Z794 Long term (current) use of insulin: Secondary | ICD-10-CM | POA: Diagnosis not present

## 2023-10-23 DIAGNOSIS — I700 Atherosclerosis of aorta: Secondary | ICD-10-CM

## 2023-10-23 DIAGNOSIS — I70221 Atherosclerosis of native arteries of extremities with rest pain, right leg: Secondary | ICD-10-CM | POA: Insufficient documentation

## 2023-10-23 DIAGNOSIS — T82856A Stenosis of peripheral vascular stent, initial encounter: Secondary | ICD-10-CM

## 2023-10-23 DIAGNOSIS — Z95828 Presence of other vascular implants and grafts: Secondary | ICD-10-CM | POA: Diagnosis not present

## 2023-10-23 DIAGNOSIS — Z9889 Other specified postprocedural states: Secondary | ICD-10-CM

## 2023-10-23 DIAGNOSIS — E7800 Pure hypercholesterolemia, unspecified: Secondary | ICD-10-CM | POA: Diagnosis not present

## 2023-10-23 DIAGNOSIS — E1151 Type 2 diabetes mellitus with diabetic peripheral angiopathy without gangrene: Secondary | ICD-10-CM | POA: Diagnosis not present

## 2023-10-23 DIAGNOSIS — I2510 Atherosclerotic heart disease of native coronary artery without angina pectoris: Secondary | ICD-10-CM | POA: Insufficient documentation

## 2023-10-23 DIAGNOSIS — Z7984 Long term (current) use of oral hypoglycemic drugs: Secondary | ICD-10-CM | POA: Diagnosis not present

## 2023-10-23 DIAGNOSIS — E1169 Type 2 diabetes mellitus with other specified complication: Secondary | ICD-10-CM | POA: Insufficient documentation

## 2023-10-23 HISTORY — PX: LOWER EXTREMITY INTERVENTION: CATH118252

## 2023-10-23 HISTORY — PX: LOWER EXTREMITY ANGIOGRAPHY: CATH118251

## 2023-10-23 LAB — GLUCOSE, CAPILLARY: Glucose-Capillary: 124 mg/dL — ABNORMAL HIGH (ref 70–99)

## 2023-10-23 LAB — CREATININE, SERUM
Creatinine, Ser: 1.55 mg/dL — ABNORMAL HIGH (ref 0.44–1.00)
GFR, Estimated: 35 mL/min — ABNORMAL LOW (ref >60)

## 2023-10-23 LAB — BUN: BUN: 21 mg/dL (ref 8–23)

## 2023-10-23 SURGERY — LOWER EXTREMITY INTERVENTION
Anesthesia: Moderate Sedation | Site: Leg Lower | Laterality: Right

## 2023-10-23 MED ORDER — MIDAZOLAM HCL 2 MG/ML PO SYRP: 8.0000 mg | ORAL_SOLUTION | Freq: Once | ORAL | Status: DC | PRN

## 2023-10-23 MED ORDER — FENTANYL CITRATE PF 50 MCG/ML IJ SOSY
PREFILLED_SYRINGE | INTRAMUSCULAR | Status: AC
  Filled 2023-10-23: qty 1

## 2023-10-23 MED ORDER — IODIXANOL 320 MG/ML IV SOLN
INTRAVENOUS | Status: DC | PRN
  Administered 2023-10-23: 85 mL

## 2023-10-23 MED ORDER — FENTANYL CITRATE (PF) 100 MCG/2ML IJ SOLN
INTRAMUSCULAR | Status: DC | PRN
  Administered 2023-10-23: 25 ug via INTRAVENOUS
  Administered 2023-10-23: 50 ug via INTRAVENOUS
  Administered 2023-10-23: 25 ug via INTRAVENOUS

## 2023-10-23 MED ORDER — HEPARIN SODIUM (PORCINE) 10000 UNIT/ML IJ SOLN
INTRAMUSCULAR | Status: DC | PRN
  Administered 2023-10-23: 6000 [IU]

## 2023-10-23 MED ORDER — LABETALOL HCL 5 MG/ML IV SOLN: 10.0000 mg | INTRAVENOUS | Status: DC | PRN

## 2023-10-23 MED ORDER — HYDROMORPHONE HCL 1 MG/ML IJ SOLN: 1.0000 mg | Freq: Once | INTRAMUSCULAR | Status: DC | PRN

## 2023-10-23 MED ORDER — HEPARIN SODIUM (PORCINE) 1000 UNIT/ML IJ SOLN
INTRAMUSCULAR | Status: AC
  Filled 2023-10-23: qty 10

## 2023-10-23 MED ORDER — METHYLPREDNISOLONE SODIUM SUCC 125 MG IJ SOLR: 125.0000 mg | Freq: Once | INTRAMUSCULAR | Status: DC | PRN

## 2023-10-23 MED ORDER — HYDRALAZINE HCL 20 MG/ML IJ SOLN: 5.0000 mg | INTRAMUSCULAR | Status: DC | PRN

## 2023-10-23 MED ORDER — LIDOCAINE HCL (PF) 1 % IJ SOLN
INTRAMUSCULAR | Status: DC | PRN
  Administered 2023-10-23: 10 mL

## 2023-10-23 MED ORDER — FAMOTIDINE 20 MG PO TABS
40.0000 mg | ORAL_TABLET | Freq: Once | ORAL | Status: DC | PRN
Start: 2023-10-23 — End: 2023-10-23

## 2023-10-23 MED ORDER — MIDAZOLAM HCL 2 MG/2ML IJ SOLN
INTRAMUSCULAR | Status: AC
  Filled 2023-10-23: qty 2

## 2023-10-23 MED ORDER — SODIUM CHLORIDE 0.9 % IV SOLN: INTRAVENOUS | Status: DC

## 2023-10-23 MED ORDER — ONDANSETRON HCL 4 MG/2ML IJ SOLN: 4.0000 mg | Freq: Four times a day (QID) | INTRAMUSCULAR | Status: DC | PRN

## 2023-10-23 MED ORDER — MIDAZOLAM HCL 2 MG/2ML IJ SOLN
INTRAMUSCULAR | Status: DC | PRN
  Administered 2023-10-23 (×2): 1 mg via INTRAVENOUS
  Administered 2023-10-23: 2 mg via INTRAVENOUS

## 2023-10-23 MED ORDER — SODIUM CHLORIDE 0.9 % IV SOLN: 250.0000 mL | INTRAVENOUS | Status: DC | PRN

## 2023-10-23 MED ORDER — NITROGLYCERIN 1 MG/10 ML FOR IR/CATH LAB
INTRA_ARTERIAL | Status: DC | PRN
  Administered 2023-10-23: 200 ug via INTRA_ARTERIAL

## 2023-10-23 MED ORDER — SODIUM CHLORIDE 0.9 % IV SOLN
INTRAVENOUS | Status: DC
Start: 2023-10-23 — End: 2023-10-23

## 2023-10-23 MED ORDER — SODIUM CHLORIDE 0.9 % IV SOLN
INTRAVENOUS | Status: DC | PRN
  Administered 2023-10-23: 250 mL via INTRAVENOUS

## 2023-10-23 MED ORDER — DIPHENHYDRAMINE HCL 50 MG/ML IJ SOLN: 50.0000 mg | Freq: Once | INTRAMUSCULAR | Status: DC | PRN

## 2023-10-23 MED ORDER — SODIUM CHLORIDE 0.9% FLUSH: 3.0000 mL | Freq: Two times a day (BID) | INTRAVENOUS | Status: DC

## 2023-10-23 MED ORDER — SODIUM CHLORIDE 0.9% FLUSH: 3.0000 mL | INTRAVENOUS | Status: DC | PRN

## 2023-10-23 MED ORDER — NITROGLYCERIN 1 MG/10 ML FOR IR/CATH LAB
INTRA_ARTERIAL | Status: AC
  Filled 2023-10-23: qty 10

## 2023-10-23 MED ORDER — HEPARIN (PORCINE) IN NACL 1000-0.9 UT/500ML-% IV SOLN
INTRAVENOUS | Status: DC | PRN
Start: 2023-10-23 — End: 2023-10-23
  Administered 2023-10-23: 1000 mL

## 2023-10-23 MED ORDER — CEFAZOLIN SODIUM-DEXTROSE 2-4 GM/100ML-% IV SOLN
2.0000 g | INTRAVENOUS | Status: AC
  Administered 2023-10-23: 2 g via INTRAVENOUS

## 2023-10-23 SURGICAL SUPPLY — 27 items
BALLOON DORADO 6X80X80 (BALLOONS) IMPLANT
BALLOON LUTONIX 018 4X300X130 (BALLOONS) IMPLANT
BALLOON LUTONIX 018 5X300X130 (BALLOONS) IMPLANT
BALLOON LUTONIX 018 6X60X130 (BALLOONS) IMPLANT
BALLOON LUTONIX 5X220X130 (BALLOONS) IMPLANT
CATH BEACON 5 .038 100 VERT TP (CATHETERS) IMPLANT
CATH PIG 70CM (CATHETERS) IMPLANT
CATH ROTAREX 135 6FR (CATHETERS) IMPLANT
CATH TEMPO 5F RIM 65CM (CATHETERS) IMPLANT
COVER PROBE ULTRASOUND 5X96 (MISCELLANEOUS) IMPLANT
DEVICE PRESTO INFLATION (MISCELLANEOUS) IMPLANT
DEVICE STARCLOSE SE CLOSURE (Vascular Products) IMPLANT
GLIDEWIRE ADV .035X260CM (WIRE) IMPLANT
GOWN STRL REUS W/ TWL LRG LVL3 (GOWN DISPOSABLE) ×1 IMPLANT
LIFESTENT SOLO 6X200X135 (Permanent Stent) IMPLANT
NDL ENTRY 21GA 7CM ECHOTIP (NEEDLE) IMPLANT
NEEDLE ENTRY 21GA 7CM ECHOTIP (NEEDLE) ×1 IMPLANT
PACK ANGIOGRAPHY (CUSTOM PROCEDURE TRAY) ×1 IMPLANT
SET INTRO CAPELLA COAXIAL (SET/KITS/TRAYS/PACK) IMPLANT
SHEATH ANL2 6FRX45 HC (SHEATH) IMPLANT
SHEATH BRITE TIP 5FRX11 (SHEATH) IMPLANT
STENT LIFESTENT 5F 7X100X135 (Permanent Stent) IMPLANT
SYR MEDRAD MARK 7 150ML (SYRINGE) IMPLANT
TUBING CONTRAST HIGH PRESS 72 (TUBING) IMPLANT
WIRE G V18X300CM (WIRE) IMPLANT
WIRE J 3MM .035X145CM (WIRE) IMPLANT
WIRE SUPRACORE 300CM (WIRE) IMPLANT

## 2023-10-23 NOTE — Discharge Instructions (Signed)

## 2023-10-23 NOTE — Op Note (Signed)
 [facility]  Percutaneous Study/Intervention Procedural Note   Date of Surgery: 10/23/2023  Surgeon:  Jackquelyn Mass, MD.  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  Introduction catheter into right lower extremity 3rd order catheter placement              2.    Contrast injection right lower extremity for distal runoff             3.  Percutaneous transluminal angioplasty and stent placement right superficial femoral and popliteal artery             4.  Thrombectomy right SFA and popliteal with the [place] Rex catheter.             5.   star close closure left common femoral arteriotomy  Anesthesia: Conscious sedation was administered under my direct supervision by the interventional radiology RN. IV Versed  plus fentanyl  were utilized. Continuous ECG, pulse oximetry and blood pressure was monitored throughout the entire procedure.  Conscious sedation was for a total of 76 minutes.  Sheath: 6 [place] Ansell left common femoral retrograde  Contrast: 85 cc  Fluoroscopy Time: 10.6 minutes  Indications:  Cynthia Sims presents with rest pain of the right lower extremity.  Noninvasive studies as well as physical examination demonstrate occlusion of the previously stented SFA.  This represents an increased risk for limb loss.  Angiography with hope for intervention has been recommended.  The risks and benefits are reviewed all questions answered patient agrees to proceed.  Procedure:  Cynthia Sims is a 73 y.o. y.o. female who was identified and appropriate procedural time out was performed.  The patient was then placed supine on the table and prepped and draped in the usual sterile fashion.    Ultrasound was placed in the sterile sleeve and the left groin was evaluated the left common femoral artery was  echolucent and pulsatile indicating patency.  Image was recorded for the permanent record and under real-time visualization a microneedle was inserted into the common femoral artery microwire followed by a micro-sheath.  A J-wire was then advanced through the micro-sheath and a  5 [place] sheath was then inserted over a J-wire. J-wire was then advanced and a 5 French pigtail catheter was positioned at the level of T12. AP projection of the aorta was then obtained. Pigtail catheter was repositioned to above the bifurcation and a bilateral oblique view of the pelvis was obtained.  Subsequently a rim catheter with the stiff angle Glidewire was used to cross the aortic bifurcation the catheter wire were advanced down into the right distal external iliac artery. Oblique view of the femoral bifurcation was then obtained and subsequently the wire was reintroduced and the pigtail catheter negotiated into the SFA representing third order catheter placement. Distal runoff was then performed.  6000 units of heparin  was then given and allowed to circulate and a 6 [place] Ansell sheath was advanced up and over the bifurcation and positioned in the right common femoral artery.  KMP  catheter and advantage Glidewire were then negotiated down into the distal popliteal.  Intraluminal positioning was confirmed and distal runoff was then completed by hand injection through the catheter.   A V18 wire was then negotiated through the [place] catheter.  The [place] Rex catheter was then prepped  on the field and advanced through the sheath and thrombectomy of the occluded SFA and above-knee popliteal was performed.  Follow-up imaging demonstrated successful recanalization with diffuse greater than 60% stenosis throughout the stented segment.  Initially, a 4 mm x 300 mm Lutonix drug-eluting balloon was positioned down to the level of the femoral condyles and angioplasty was performed to 10 atm.  Inflation was for 1 minute.  Next a 5 mm x 300  mm Lutonix drug-eluting balloon was positioned across the proximal portion of the stent and inflated to 10 atm for 1 minute.  Follow-up imaging proximally demonstrated 2 persistent lesions 1 proximally which was more focal and greater than 70% the other was several centimeters more distally and approximately 60%.  A 6 mm balloon was then advanced across these lesions inflated to 12 atm for approximately 1 minute.  Follow-up imaging did not demonstrate significant improvement and a 6 mm x 100 mm life stent was deployed and then postdilated with a 6 mm [place] balloon inflated to 14 atm.  Follow-up imaging demonstrated less than 10% residual stenosis and distal runoff was performed.  This demonstrated diffuse narrowing at the level of Hunter's canal with a lesion in the mid popliteal approximately 2 cm below the edge of the existing stent.  A 6 mm x 200 mm life stent was then deployed and postdilated with a 5 mm Lutonix drug-eluting balloon inflated to 12 atm for 1 minute.  Follow-up imaging demonstrated sluggish flow and 200 mcg of intra-arterial nitroglycerin  was administered.  Follow-up angiography demonstrated wide patency with rapid flow of contrast and preservation of the trifurcation.  Posterior tibial remains the dominant runoff.  After review of these images the sheath is pulled into the left external iliac oblique of the common femoral is obtained and a Star close device deployed. There no immediate complications.   Findings:  The abdominal aorta is opacified with a bolus injection contrast. Renal arteries are single and widely patent. The aorta itself has diffuse disease but no hemodynamically significant lesions. The common and external iliac arteries are widely patent bilaterally.  The right common femoral is widely patent as is the profunda femoris.  The SFA is occluded at the origin and remains occluded down to the mid popliteal.  Mid and distal popliteal widely patent as is the trifurcation.   Posterior tibial is dominant runoff to the foot filling the plantar arteries and the pedal arch.  The anterior tibial is patent down to the ankle but there is nonvisualization of the dorsalis pedis so the anterior tibial does not contribute significantly to the foot.  The peroneal is patent down to the ankle and collateralizes to the foot.  Following thrombectomy with the [place] Rex catheter there is recanalization of the SFA and popliteal arteries.    Following angioplasty there is greater than 60% residual stenosis.  Following stenting as described above there is now less than 10% residual stenosis with wide patency of the SFA popliteal and preservation of the distal runoff.     Summary: Successful recanalization right lower extremity for limb salvage                        Disposition: Patient was taken to the recovery room in stable condition having tolerated the procedure well.  Shikita Vaillancourt, Ninette Basque 10/23/2023,9:50 AM

## 2023-10-23 NOTE — Interval H&P Note (Signed)
 History and Physical Interval Note:  10/23/2023 8:19 AM  Cynthia Sims  has presented today for surgery, with the diagnosis of RLE Angio   ASO w rest pain.  The various methods of treatment have been discussed with the patient and family. After consideration of risks, benefits and other options for treatment, the patient has consented to  Procedure(s): LOWER EXTREMITY INTERVENTION (Right) Lower Extremity Angiography (Right) as a surgical intervention.  The patient's history has been reviewed, patient examined, no change in status, stable for surgery.  I have reviewed the patient's chart and labs.  Questions were answered to the patient's satisfaction.     Devon Fogo

## 2023-10-24 ENCOUNTER — Encounter: Payer: Self-pay | Admitting: Vascular Surgery

## 2023-10-28 DIAGNOSIS — E1142 Type 2 diabetes mellitus with diabetic polyneuropathy: Secondary | ICD-10-CM | POA: Diagnosis not present

## 2023-10-30 ENCOUNTER — Encounter (INDEPENDENT_AMBULATORY_CARE_PROVIDER_SITE_OTHER): Payer: Self-pay

## 2023-10-31 DIAGNOSIS — E1169 Type 2 diabetes mellitus with other specified complication: Secondary | ICD-10-CM | POA: Diagnosis not present

## 2023-10-31 DIAGNOSIS — Z794 Long term (current) use of insulin: Secondary | ICD-10-CM | POA: Diagnosis not present

## 2023-10-31 DIAGNOSIS — E785 Hyperlipidemia, unspecified: Secondary | ICD-10-CM | POA: Diagnosis not present

## 2023-10-31 DIAGNOSIS — E11649 Type 2 diabetes mellitus with hypoglycemia without coma: Secondary | ICD-10-CM | POA: Diagnosis not present

## 2023-10-31 DIAGNOSIS — I152 Hypertension secondary to endocrine disorders: Secondary | ICD-10-CM | POA: Diagnosis not present

## 2023-10-31 DIAGNOSIS — E1142 Type 2 diabetes mellitus with diabetic polyneuropathy: Secondary | ICD-10-CM | POA: Diagnosis not present

## 2023-10-31 DIAGNOSIS — E1159 Type 2 diabetes mellitus with other circulatory complications: Secondary | ICD-10-CM | POA: Diagnosis not present

## 2023-11-02 ENCOUNTER — Telehealth: Payer: Self-pay | Admitting: Pharmacist

## 2023-11-02 NOTE — Progress Notes (Signed)
   11/02/2023  Patient ID: Cynthia Sims, female   DOB: 1950-11-30, 73 y.o.   MRN: [id number]  Called and spoke with the patient on the phone today. Call was to be for BP review, but with recent visits I can see her BP concerns have resolved lately.   Recent A1c looked great as well. Back on the Ozempic 0.25mg  weekly per Endo recommendations.  No concerns at this time. No follow-up needed as DM and BP concerns from referral have been resolved.   Delvin File, PharmD [facility]  Phone Number: [phone]

## 2023-11-08 DIAGNOSIS — H01006 Unspecified blepharitis left eye, unspecified eyelid: Secondary | ICD-10-CM | POA: Diagnosis not present

## 2023-11-08 DIAGNOSIS — E119 Type 2 diabetes mellitus without complications: Secondary | ICD-10-CM | POA: Diagnosis not present

## 2023-11-08 DIAGNOSIS — H01003 Unspecified blepharitis right eye, unspecified eyelid: Secondary | ICD-10-CM | POA: Diagnosis not present

## 2023-11-08 DIAGNOSIS — H35033 Hypertensive retinopathy, bilateral: Secondary | ICD-10-CM | POA: Diagnosis not present

## 2023-11-08 DIAGNOSIS — H5203 Hypermetropia, bilateral: Secondary | ICD-10-CM | POA: Diagnosis not present

## 2023-11-08 DIAGNOSIS — H52223 Regular astigmatism, bilateral: Secondary | ICD-10-CM | POA: Diagnosis not present

## 2023-11-08 DIAGNOSIS — Z961 Presence of intraocular lens: Secondary | ICD-10-CM | POA: Diagnosis not present

## 2023-11-08 DIAGNOSIS — E113291 Type 2 diabetes mellitus with mild nonproliferative diabetic retinopathy without macular edema, right eye: Secondary | ICD-10-CM | POA: Diagnosis not present

## 2023-11-08 DIAGNOSIS — Z7984 Long term (current) use of oral hypoglycemic drugs: Secondary | ICD-10-CM | POA: Diagnosis not present

## 2023-11-08 DIAGNOSIS — H524 Presbyopia: Secondary | ICD-10-CM | POA: Diagnosis not present

## 2023-11-08 LAB — HM DIABETES EYE EXAM

## 2023-11-09 ENCOUNTER — Other Ambulatory Visit: Payer: Self-pay | Admitting: Family Medicine

## 2023-11-09 DIAGNOSIS — E0842 Diabetes mellitus due to underlying condition with diabetic polyneuropathy: Secondary | ICD-10-CM

## 2023-11-09 DIAGNOSIS — F411 Generalized anxiety disorder: Secondary | ICD-10-CM

## 2023-11-12 ENCOUNTER — Other Ambulatory Visit (INDEPENDENT_AMBULATORY_CARE_PROVIDER_SITE_OTHER): Payer: Self-pay | Admitting: Nurse Practitioner

## 2023-11-14 ENCOUNTER — Encounter (INDEPENDENT_AMBULATORY_CARE_PROVIDER_SITE_OTHER): Payer: Self-pay | Admitting: Vascular Surgery

## 2023-11-14 ENCOUNTER — Ambulatory Visit (INDEPENDENT_AMBULATORY_CARE_PROVIDER_SITE_OTHER)

## 2023-11-14 ENCOUNTER — Ambulatory Visit (INDEPENDENT_AMBULATORY_CARE_PROVIDER_SITE_OTHER): Admitting: Vascular Surgery

## 2023-11-14 VITALS — BP 119/73 | HR 69 | Resp 16 | Wt 148.2 lb

## 2023-11-14 DIAGNOSIS — E785 Hyperlipidemia, unspecified: Secondary | ICD-10-CM | POA: Diagnosis not present

## 2023-11-14 DIAGNOSIS — I70213 Atherosclerosis of native arteries of extremities with intermittent claudication, bilateral legs: Secondary | ICD-10-CM | POA: Diagnosis not present

## 2023-11-14 DIAGNOSIS — I10 Essential (primary) hypertension: Secondary | ICD-10-CM | POA: Diagnosis not present

## 2023-11-14 DIAGNOSIS — E1169 Type 2 diabetes mellitus with other specified complication: Secondary | ICD-10-CM

## 2023-11-15 LAB — VAS US ABI WITH/WO TBI
Left ABI: 1.01
Right ABI: 1.08

## 2023-11-20 ENCOUNTER — Encounter (INDEPENDENT_AMBULATORY_CARE_PROVIDER_SITE_OTHER): Payer: Self-pay | Admitting: Vascular Surgery

## 2023-11-20 NOTE — Progress Notes (Signed)
 Subjective:    Patient ID: Cynthia Sims, female    DOB: Nov 15, 1950, 73 y.o.   MRN: [id number] Chief Complaint  Patient presents with   Follow-up    [facility] 3 week with abi    Cynthia Sims is a 73 y.o. (1951-01-21) female who presents with chief complaint of check circulation.   History of Present Illness:    The patient returns to the office for followup and review status post angiogram with intervention on 10/31/2022.    Procedure: Percutaneous transluminal angioplasty and stent placement left superficial femoral artery and popliteal    status post angiogram with intervention on 10/24/2022:   Procedure: Percutaneous transluminal angioplasty and stent placement left superficial femoral artery and popliteal    The patient notes improvement in the lower extremity symptoms. No interval shortening of the patient's claudication distance or rest pain symptoms. No new ulcers or wounds have occurred since the last visit.   There have been no significant changes to the patient's overall health care.   No documented history of amaurosis fugax or recent TIA symptoms. There are no recent neurological changes noted. No documented history of DVT, PE or superficial thrombophlebitis. The patient denies recent episodes of angina or shortness of breath.    ABI's Rt=1.08 and Lt=1.01  (previous ABI's Rt=.58 and Lt=1.11) Duplex US  of the bilateral lower extremity arterial system shows SFA stents are patent bilaterally with uniform velocities throughout the femoral-popliteal arteries.     Review of Systems  Constitutional: Negative.   Musculoskeletal:        Prior history of pain with walking  All other systems reviewed and are negative.      Objective:    Physical Exam Constitutional:      Appearance: Normal appearance.  HENT:     Head: Normocephalic.  Eyes:     Pupils: Pupils are equal, round, and reactive to light.  Cardiovascular:     Rate and Rhythm: Normal rate and regular  rhythm.     Pulses: Normal pulses.     Heart sounds: Normal heart sounds.  Pulmonary:     Effort: Pulmonary effort is normal.     Breath sounds: Normal breath sounds.  Abdominal:     General: Abdomen is flat. Bowel sounds are normal.     Palpations: Abdomen is soft.  Musculoskeletal:        General: Normal range of motion.  Skin:    General: Skin is warm and dry.     Capillary Refill: Capillary refill takes 2 to 3 seconds.  Neurological:     General: No focal deficit present.     Mental Status: She is alert and oriented to person, place, and time. Mental status is at baseline.  Psychiatric:        Mood and Affect: Mood normal.        Behavior: Behavior normal.        Thought Content: Thought content normal.        Judgment: Judgment normal.     BP 119/73   Pulse 69   Resp 16   Wt 148 lb 3.2 oz (67.2 kg)   BMI 28.00 kg/m   Past Medical History:  Diagnosis Date   ALT (SGPT) level raised    Anemia    Anxiety    Arthritis    Depression    Diabetes mellitus without complication (HCC)    Elevated cholesterol    GERD (gastroesophageal reflux disease)    Hemorrhoids  Hypertension    Kidney disease     Social History   Socioeconomic History   Marital status: Married    Spouse name: Not on file   Number of children: 5   Years of education: Not on file   Highest education level: 8th grade  Occupational History   Occupation: retired  Tobacco Use   Smoking status: Never    Passive exposure: Never   Smokeless tobacco: Never  Vaping Use   Vaping status: Never Used  Substance and Sexual Activity   Alcohol use: Not Currently   Drug use: Never   Sexual activity: Yes    Birth control/protection: Post-menopausal  Other Topics Concern   Not on file  Social History Narrative   Applied for food stamps. Pt lives with husband, Hinton Luis, and is living off social security money and is raising one of her grandchildren.    Social Drivers of Corporate investment banker  Strain: Low Risk  (10/04/2022)   Overall Financial Resource Strain (CARDIA)    Difficulty of Paying Living Expenses: Not hard at all  Food Insecurity: Food Insecurity Present (10/02/2023)   Hunger Vital Sign    Worried About Running Out of Food in the Last Year: Sometimes true    Ran Out of Food in the Last Year: Sometimes true  Transportation Needs: No Transportation Needs (10/02/2023)   PRAPARE - Administrator, Civil Service (Medical): No    Lack of Transportation (Non-Medical): No  Physical Activity: Inactive (10/04/2022)   Exercise Vital Sign    Days of Exercise per Week: 0 days    Minutes of Exercise per Session: 0 min  Stress: No Stress Concern Present (10/04/2022)   [employer] of Occupational Health - Occupational Stress Questionnaire    Feeling of Stress : Not at all  Social Connections: Moderately Integrated (10/04/2022)   Social Connection and Isolation Panel [NHANES]    Frequency of Communication with Friends and Family: More than three times a week    Frequency of Social Gatherings with Friends and Family: Once a week    Attends Religious Services: 1 to 4 times per year    Active Member of [employer] or Organizations: No    Attends Banker Meetings: Never    Marital Status: Married  Catering manager Violence: Not At Risk (10/02/2023)   Humiliation, Afraid, Rape, and Kick questionnaire    Fear of Current or Ex-Partner: No    Emotionally Abused: No    Physically Abused: No    Sexually Abused: No    Past Surgical History:  Procedure Laterality Date   abdominal blockage     CESAREAN SECTION  1983   CESAREAN SECTION     CHOLECYSTECTOMY     COLONOSCOPY WITH PROPOFOL  N/A 10/29/2015   Procedure: COLONOSCOPY WITH PROPOFOL ;  Surgeon: Cassie Click, MD;  Location: [facility] ENDOSCOPY;  Service: Endoscopy;  Laterality: N/A;   COLONOSCOPY WITH PROPOFOL  N/A 11/07/2017   Procedure: COLONOSCOPY WITH PROPOFOL ;  Surgeon: Toledo, Alphonsus Jeans, MD;  Location: [facility]  ENDOSCOPY;  Service: Gastroenterology;  Laterality: N/A;   DILATION AND CURETTAGE OF UTERUS  2001   ESOPHAGOGASTRODUODENOSCOPY (EGD) WITH PROPOFOL  N/A 10/29/2015   Procedure: ESOPHAGOGASTRODUODENOSCOPY (EGD) WITH PROPOFOL ;  Surgeon: Cassie Click, MD;  Location[facility] ENDOSCOPY;  Service: Endoscopy;  Laterality: N/A;   LOWER EXTREMITY ANGIOGRAPHY Right 10/24/2022   Procedure: Lower Extremity Angiography;  Surgeon: Jackquelyn Mass, MD;  Location: [facility] INVASIVE CV LAB;  Service: Cardiovascular;  Laterality: Right;  LOWER EXTREMITY ANGIOGRAPHY Left 10/31/2022   Procedure: Lower Extremity Angiography;  Surgeon: Jackquelyn Mass, MD;  Location: [facility] INVASIVE CV LAB;  Service: Cardiovascular;  Laterality: Left;   LOWER EXTREMITY ANGIOGRAPHY Right 10/23/2023   Procedure: Lower Extremity Angiography;  Surgeon: Jackquelyn Mass, MD;  Location: [facility] INVASIVE CV LAB;  Service: Cardiovascular;  Laterality: Right;   LOWER EXTREMITY INTERVENTION Right 10/23/2023   Procedure: LOWER EXTREMITY INTERVENTION;  Surgeon: Jackquelyn Mass, MD;  Location: [facility] INVASIVE CV LAB;  Service: Cardiovascular;  Laterality: Right;    Family History  Problem Relation Age of Onset   Alzheimer's disease Mother    COPD Mother    Breast cancer Mother 59   Osteoporosis Mother    Heart attack Father    Lupus Father    Diabetes Father    Hypertension Father    Diabetes Sister    COPD Maternal Grandmother    Diabetes Paternal Grandfather    Diabetes Sister    Diabetes Sister     No Known Allergies     Latest Ref Rng & Units 05/28/2023    9:52 AM 05/21/2023    4:51 AM 05/20/2023    4:39 AM  CBC  WBC 3.4 - 10.8 x10E3/uL 7.1  4.1  5.2   Hemoglobin 11.1 - 15.9 g/dL 16.1  9.4  9.5   Hematocrit 34.0 - 46.6 % 32.7  28.0  28.5   Platelets 150 - 450 x10E3/uL 199  132  131        CMP     Component Value Date/Time   NA 142 10/08/2023 0908   NA 135 (L) 03/01/2012 2236   K 4.3 10/08/2023 0908   K 4.4 03/01/2012  2236   CL 110 (H) 10/08/2023 0908   CL 103 03/01/2012 2236   CO2 14 (L) 10/08/2023 0908   CO2 18 (L) 03/01/2012 2236   GLUCOSE 123 (H) 10/08/2023 0908   GLUCOSE 120 (H) 05/21/2023 0451   GLUCOSE 494 (H) 03/01/2012 2236   BUN 21 10/23/2023 0727   BUN 20 10/08/2023 0908   BUN 23 (H) 03/01/2012 2236   CREATININE 1.55 (H) 10/23/2023 0727   CREATININE 1.72 (H) 03/01/2012 2236   CALCIUM 7.6 (L) 10/08/2023 0908   CALCIUM 8.3 (L) 03/01/2012 2236   PROT 5.3 (L) 05/18/2023 0433   PROT 6.8 06/09/2022 1532   PROT 6.7 03/01/2012 2236   ALBUMIN 2.5 (L) 05/18/2023 0433   ALBUMIN 4.2 06/09/2022 1532   ALBUMIN 3.2 (L) 03/01/2012 2236   AST 18 05/18/2023 0433   AST 47 (H) 03/01/2012 2236   ALT 12 05/18/2023 0433   ALT 56 03/01/2012 2236   ALKPHOS 54 05/18/2023 0433   ALKPHOS 75 03/01/2012 2236   BILITOT 0.5 05/18/2023 0433   BILITOT <0.2 06/09/2022 1532   BILITOT 0.5 03/01/2012 2236   EGFR 36 (L) 10/08/2023 0908   GFRNONAA 35 (L) 10/23/2023 0727   GFRNONAA 31 (L) 03/01/2012 2236     VAS US  ABI WITH/WO TBI Result Date: 11/15/2023  LOWER EXTREMITY DOPPLER STUDY Patient Name:  ZAYANA SALVADOR  Date of Exam:   11/14/2023 Medical Rec #: [id number]        Accession #:    [id number] Date of Birth: 1950/08/11        Patient Gender: F Patient Age:   68 years Exam Location:  [facility] Procedure:      VAS US  ABI WITH/WO TBI Referring Phys: --------------------------------------------------------------------------------  Indications: Claudication, rest pain, and peripheral  artery disease. High Risk Factors: Hypertension.  Vascular Interventions: 10/23/2023 Percutaneous transluminal angioplasty and                         stent placement right superficial femoral and popliteal                         artery                         4. Thrombectomy right SFA and popliteal with the Rota                         Rex catheter. Comparison Study: 10/2023 Performing Technologist: Faustine Hoof RVT  Examination  Guidelines: A complete evaluation includes at minimum, Doppler waveform signals and systolic blood pressure reading at the level of bilateral brachial, anterior tibial, and posterior tibial arteries, when vessel segments are accessible. Bilateral testing is considered an integral part of a complete examination. Photoelectric Plethysmograph (PPG) waveforms and toe systolic pressure readings are included as required and additional duplex testing as needed. Limited examinations for reoccurring indications may be performed as noted.  ABI Findings: +---------+------------------+-----+---------+--------+ Right    Rt Pressure (mmHg)IndexWaveform Comment  +---------+------------------+-----+---------+--------+ Brachial 138                                      +---------+------------------+-----+---------+--------+ PTA      147               1.05 triphasic         +---------+------------------+-----+---------+--------+ DP       151               1.08 triphasic         +---------+------------------+-----+---------+--------+ Great Toe139               0.99 Normal            +---------+------------------+-----+---------+--------+ +---------+------------------+-----+---------+-------+ Left     Lt Pressure (mmHg)IndexWaveform Comment +---------+------------------+-----+---------+-------+ Brachial 140                                     +---------+------------------+-----+---------+-------+ PTA      142               1.01 triphasic        +---------+------------------+-----+---------+-------+ DP       120               0.86 biphasic         +---------+------------------+-----+---------+-------+ Great Toe125               0.89 Normal           +---------+------------------+-----+---------+-------+ +-------+-----------+-----------+------------+------------+ ABI/TBIToday's ABIToday's TBIPrevious ABIPrevious TBI  +-------+-----------+-----------+------------+------------+ Right  1.08       .99        .58         .34          +-------+-----------+-----------+------------+------------+ Left   1.01       .89        1.11        .83          +-------+-----------+-----------+------------+------------+ Right ABIs and TBIs appear increased compared to prior study on 10/19/2023.  Summary:  Right: Resting right ankle-brachial index is within normal range. The right toe-brachial index is normal. Left: Resting left ankle-brachial index is within normal range. The left toe-brachial index is normal. *See table(s) above for measurements and observations.  Electronically signed by Devon Fogo MD on 11/15/2023 at 8:37:06 AM.    Final    VAS US  ABI WITH/WO TBI Result Date: 10/19/2023  LOWER EXTREMITY DOPPLER STUDY Patient Name:  SHASHANA FULLINGTON  Date of Exam:   10/18/2023 Medical Rec #: [id number]        Accession #:    [id number] Date of Birth: 01-Jan-1951        Patient Gender: F Patient Age:   18 years Exam Location:  [facility] Procedure:      VAS US  ABI WITH/WO TBI Referring Phys: [facility] --------------------------------------------------------------------------------  Indications: Claudication, rest pain, and peripheral artery disease. High Risk Factors: Hypertension, no history of smoking.  Vascular Interventions: 10/31/2022 Left SFA Pop stent                          10/24/2019 Right SFA stent. Performing Technologist: Oneta Bilberry RVT  Examination Guidelines: A complete evaluation includes at minimum, Doppler waveform signals and systolic blood pressure reading at the level of bilateral brachial, anterior tibial, and posterior tibial arteries, when vessel segments are accessible. Bilateral testing is considered an integral part of a complete examination. Photoelectric Plethysmograph (PPG) waveforms and toe systolic pressure readings are included as required and additional duplex testing as needed.  Limited examinations for reoccurring indications may be performed as noted.  ABI Findings: +---------+------------------+-----+----------+--------+ Right    Rt Pressure (mmHg)IndexWaveform  Comment  +---------+------------------+-----+----------+--------+ Brachial 151                                       +---------+------------------+-----+----------+--------+ PTA      88                0.58 monophasic         +---------+------------------+-----+----------+--------+ DP       84                0.56 monophasic         +---------+------------------+-----+----------+--------+ Great Toe52                0.34                    +---------+------------------+-----+----------+--------+ +---------+------------------+-----+---------+-------+ Left     Lt Pressure (mmHg)IndexWaveform Comment +---------+------------------+-----+---------+-------+ Brachial 148                                     +---------+------------------+-----+---------+-------+ PTA      167               1.11 triphasic        +---------+------------------+-----+---------+-------+ DP       141               0.93 biphasic         +---------+------------------+-----+---------+-------+ Great Toe125               0.83                  +---------+------------------+-----+---------+-------+ +-------+-----------+-----------+------------+------------+ ABI/TBIToday's ABIToday's TBIPrevious ABIPrevious TBI +-------+-----------+-----------+------------+------------+ Right  0.58  0.34       1.09        0.94         +-------+-----------+-----------+------------+------------+ Left   1.11       0.83       1.06        1.01         +-------+-----------+-----------+------------+------------+  Right ABIs appear decreased compared to prior study on 09/06/20224. Left ABIs appear essentially unchanged compared to prior study on 02/16/2023.  Summary: Right: Resting right ankle-brachial index  indicates moderate right lower extremity arterial disease. The right toe-brachial index is abnormal. Left: Resting left ankle-brachial index is within normal range. The left toe-brachial index is normal. *See table(s) above for measurements and observations.  Electronically signed by Devon Fogo MD on 10/19/2023 at 11:25:34 AM.    Final        Assessment & Plan:   1. Atherosclerosis of native artery of both lower extremities with intermittent claudication (HCC) (Primary) Recommend:   The patient has evidence of atherosclerosis of the lower extremities with claudication.  The patient does not voice lifestyle limiting changes at this point in time.   Noninvasive studies do not suggest clinically significant change.   No invasive studies, angiography or surgery at this time The patient should continue walking and begin a more formal exercise program.  The patient should continue antiplatelet therapy and aggressive treatment of the lipid abnormalities   No changes in the patient's medications at this time   Continued surveillance is indicated as atherosclerosis is likely to progress with time.     The patient will continue follow up with noninvasive studies as ordered.  - VAS US  ABI WITH/WO TBI; Future  2. Essential hypertension Continue antihypertensive medications as already ordered, these medications have been reviewed and there are no changes at this time.   3. Hyperlipidemia associated with type 2 diabetes mellitus (HCC) Continue statin as ordered and reviewed, no changes at this time   4. Type 2 Diabetes mellitus with hyperlipidemia (HCC) Continue hypoglycemic medications as already ordered, these medications have been reviewed and there are no changes at this time.   Hgb A1C to be monitored as already arranged by primary service  Current Outpatient Medications on File Prior to Visit  Medication Sig Dispense Refill   acetaminophen  (TYLENOL ) 500 MG tablet Take 500 mg by mouth  every 6 (six) hours as needed.      aspirin  EC 81 MG tablet Take 81 mg by mouth daily.      carvedilol  (COREG ) 25 MG tablet Take 0.5 tablets (12.5 mg total) by mouth 2 (two) times daily. 90 tablet 2   cholestyramine  (QUESTRAN ) 4 g packet DISSOLVE AND TAKE ONE PACKET BY MOUTH THREE TIMES DAILY 270 each 0   clopidogrel  (PLAVIX ) 75 MG tablet Take 1 tablet by mouth once daily 90 tablet 3   cyanocobalamin  1000 MCG tablet Take 1,000 mcg by mouth daily.     DULoxetine  (CYMBALTA ) 60 MG capsule Take 1 capsule by mouth once daily 90 capsule 0   gabapentin  (NEURONTIN ) 100 MG capsule TAKE 1 CAPSULE BY MOUTH THREE TIMES DAILY 270 capsule 1   GVOKE HYPOPEN 2-PACK 1 MG/0.2ML SOAJ Inject 1 mg as directed as directed.  INJECT 1 PEN IN CASE OF SEVERE HYPOGLYCEMIA     Insulin  Glargine (BASAGLAR  KWIKPEN) 100 UNIT/ML INJECT 24 UNITS SUBCUTANEOUSLY ONCE DAILY 15 mL 0   isosorbide  mononitrate (IMDUR ) 30 MG 24 hr tablet Take 1 tablet by mouth once daily 90 tablet  2   lovastatin  (MEVACOR ) 40 MG tablet Take 1 tablet by mouth once daily 90 tablet 0   metFORMIN  (GLUCOPHAGE ) 500 MG tablet TAKE 1 TABLET BY MOUTH TWICE DAILY WITH A MEAL 180 tablet 3   nitroGLYCERIN  (NITROSTAT ) 0.4 MG SL tablet Place 1 tablet (0.4 mg total) under the tongue every 5 (five) minutes as needed for chest pain. 25 tablet 3   ondansetron  (ZOFRAN -ODT) 4 MG disintegrating tablet Take 1 tablet (4 mg total) by mouth every 8 (eight) hours as needed for nausea or vomiting. 20 tablet 0   pantoprazole  (PROTONIX ) 40 MG tablet Take 1 tablet by mouth once daily 30 tablet 0   Semaglutide,0.25 or 0.5MG /DOS, 2 MG/3ML SOPN Inject into the skin.     trimethoprim  (TRIMPEX ) 100 MG tablet Take 1 tablet (100 mg total) by mouth daily. 30 tablet 11   No current facility-administered medications on file prior to visit.    There are no Patient Instructions on file for this visit. No follow-ups on file.   Annamaria Barrette, NP

## 2023-11-28 DIAGNOSIS — E1142 Type 2 diabetes mellitus with diabetic polyneuropathy: Secondary | ICD-10-CM | POA: Diagnosis not present

## 2023-11-29 ENCOUNTER — Other Ambulatory Visit: Payer: Self-pay | Admitting: Family Medicine

## 2023-11-29 DIAGNOSIS — E0842 Diabetes mellitus due to underlying condition with diabetic polyneuropathy: Secondary | ICD-10-CM

## 2023-11-29 MED ORDER — BASAGLAR KWIKPEN 100 UNIT/ML ~~LOC~~ SOPN: 24.0000 [IU] | PEN_INJECTOR | Freq: Every day | SUBCUTANEOUS | 0 refills | Status: AC

## 2023-11-29 NOTE — Telephone Encounter (Signed)
 Patients daughter in law is calling to update the insulin  needed is Insulin  Glargine (BASAGLAR  KWIKPEN) 100 UNIT/ML [[id number]]

## 2023-11-29 NOTE — Telephone Encounter (Signed)
 Copied from CRM [id number]. Topic: Clinical - Medication Refill >> Nov 29, 2023  3:54 PM Bearl Botts E wrote: Medication: Insulin  pt's daughter in law called to report that the patient is in [place]  for a funeral, accidentally left her insulin . Please advise, will be there until Monday.   Has the patient contacted their pharmacy? Yes (Agent: If no, request that the patient contact the pharmacy for the refill. If patient does not wish to contact the pharmacy document the reason why and proceed with request.) (Agent: If yes, when and what did the pharmacy advise?)  This is the patient's preferred pharmacy:  [id number] [place] [place] - [address] [place] [address] [place] [address] Phone: [phone] Fax: [phone]  Is this the correct pharmacy for this prescription? Yes If no, delete pharmacy and type the correct one.   Has the prescription been filled recently? Yes  Is the patient out of the medication? Yes  Has the patient been seen for an appointment in the last year OR does the patient have an upcoming appointment? Yes  Can we respond through MyChart? Yes  Agent: Please be advised that Rx refills may take up to 3 business days. We ask that you follow-up with your pharmacy.

## 2023-11-29 NOTE — Telephone Encounter (Signed)
 Requested Prescriptions  Pending Prescriptions Disp Refills   Insulin  Glargine (BASAGLAR  KWIKPEN) 100 UNIT/ML 15 mL 0    Sig: Inject 24 Units into the skin daily. INJECT 24 UNITS SUBCUTANEOUSLY ONCE DAILY     Endocrinology:  Diabetes - Insulins Passed - 11/29/2023  5:38 PM      Passed - HBA1C is between 0 and 7.9 and within 180 days    Hgb A1c MFr Bld  Date Value Ref Range Status  10/08/2023 6.2 (H) 4.8 - 5.6 % Final    Comment:             Prediabetes: 5.7 - 6.4          Diabetes: >6.4          Glycemic control for adults with diabetes: <7.0          Passed - Valid encounter within last 6 months    Recent Outpatient Visits           1 month ago Unintentional weight loss   [place] [facility] Simmons-Robinson, [place], MD   3 months ago Abnormal uterine bleeding   [place] [facility] Simmons-Robinson, [place], MD   4 months ago Abnormal uterine bleeding    [facility] Simmons-Robinson, Judyann Number, MD       Future Appointments             In 9 months MacDiarmid, Geralyn Knee, MD [facility] [place]

## 2023-11-29 NOTE — Telephone Encounter (Signed)
 Called patient to verify which insulin  requested and if [facility] and [facility] [place]. No answer, LVMTCB [phone].

## 2023-11-30 ENCOUNTER — Encounter: Payer: Self-pay | Admitting: Family Medicine

## 2023-12-05 ENCOUNTER — Other Ambulatory Visit (HOSPITAL_COMMUNITY): Payer: Self-pay

## 2023-12-05 DIAGNOSIS — H01006 Unspecified blepharitis left eye, unspecified eyelid: Secondary | ICD-10-CM | POA: Diagnosis not present

## 2023-12-05 DIAGNOSIS — H01003 Unspecified blepharitis right eye, unspecified eyelid: Secondary | ICD-10-CM | POA: Diagnosis not present

## 2023-12-05 DIAGNOSIS — E113291 Type 2 diabetes mellitus with mild nonproliferative diabetic retinopathy without macular edema, right eye: Secondary | ICD-10-CM | POA: Diagnosis not present

## 2023-12-06 ENCOUNTER — Other Ambulatory Visit (HOSPITAL_COMMUNITY): Payer: Self-pay

## 2023-12-06 ENCOUNTER — Telehealth: Payer: Self-pay | Admitting: Pharmacy Technician

## 2023-12-06 NOTE — Telephone Encounter (Signed)
 Pharmacy Patient Advocate Encounter   Received notification from Onbase that prior authorization for BASAGLAR  [facility] 100UNIT/ML is required/requested.   Insurance verification completed.   The patient is insured through [employer] ADVANTAGE/RX ADVANCE .   Per test claim:  LANTUS  is preferred by the insurance.  If suggested medication is appropriate, Please send in a new RX and discontinue this one. If not, please advise as to why it's not appropriate so that we may request a Prior Authorization. Please note, some preferred medications may still require a PA.  If the suggested medications have not been trialed and there are no contraindications to their use, the PA will not be submitted, as it will not be approved.  Lantus  is preferred and covered at 100%

## 2023-12-07 ENCOUNTER — Ambulatory Visit: Admitting: Podiatry

## 2023-12-07 MED ORDER — INSULIN GLARGINE 100 UNIT/ML SOLOSTAR PEN: 24.0000 [IU] | PEN_INJECTOR | Freq: Every day | SUBCUTANEOUS | 2 refills | Status: DC

## 2023-12-07 NOTE — Addendum Note (Signed)
 Addended by: SIMMONS-ROBINSON, Jillann Charette L on: 12/07/2023 01:57 PM   Modules accepted: Orders

## 2023-12-07 NOTE — Telephone Encounter (Signed)
 Received message that Lantus  is preferred insulin  agent for insurance , will send updated lantus  order for next refill   Will need to stop basaglar  insulin  July 2025

## 2023-12-15 ENCOUNTER — Other Ambulatory Visit (INDEPENDENT_AMBULATORY_CARE_PROVIDER_SITE_OTHER): Payer: Self-pay | Admitting: Nurse Practitioner

## 2023-12-15 ENCOUNTER — Other Ambulatory Visit: Payer: Self-pay | Admitting: Family Medicine

## 2023-12-15 DIAGNOSIS — E1122 Type 2 diabetes mellitus with diabetic chronic kidney disease: Secondary | ICD-10-CM

## 2023-12-28 DIAGNOSIS — E1142 Type 2 diabetes mellitus with diabetic polyneuropathy: Secondary | ICD-10-CM | POA: Diagnosis not present

## 2024-01-03 ENCOUNTER — Ambulatory Visit: Admitting: Family Medicine

## 2024-01-03 ENCOUNTER — Encounter: Payer: Self-pay | Admitting: Family Medicine

## 2024-01-03 VITALS — BP 133/62 | HR 62 | Temp 97.5°F | Ht 61.0 in | Wt 139.5 lb

## 2024-01-03 DIAGNOSIS — E1149 Type 2 diabetes mellitus with other diabetic neurological complication: Secondary | ICD-10-CM

## 2024-01-03 DIAGNOSIS — Z794 Long term (current) use of insulin: Secondary | ICD-10-CM | POA: Diagnosis not present

## 2024-01-03 DIAGNOSIS — Z7984 Long term (current) use of oral hypoglycemic drugs: Secondary | ICD-10-CM | POA: Diagnosis not present

## 2024-01-03 DIAGNOSIS — Z7985 Long-term (current) use of injectable non-insulin antidiabetic drugs: Secondary | ICD-10-CM | POA: Diagnosis not present

## 2024-01-03 DIAGNOSIS — M5416 Radiculopathy, lumbar region: Secondary | ICD-10-CM | POA: Diagnosis not present

## 2024-01-03 MED ORDER — GABAPENTIN 300 MG PO CAPS: 300.0000 mg | ORAL_CAPSULE | Freq: Three times a day (TID) | ORAL | 1 refills | Status: DC

## 2024-01-03 NOTE — Progress Notes (Signed)
 Acute visit   [name]   DOB: 07/20/1950   73 y.o. Female  MRN: [id number] PCP: Sharma Coyer, MD   Chief Complaint  Patient presents with   Leg Pain    Right leg, surgery 1-2 months ago for stent placement. At post op appt, she was told that she may have pain for up to 6 months. States it is in the back of the leg and runs all the way up to her buttock (R). Has another f/u in Sept. With surgeon.  Takes tylenol  with no relief, pain will wake her out of sleep at night. Is at an 8/10 pain right now    Subjective    Discussed the use of AI scribe software for clinical note transcription with the patient, who gave verbal consent to proceed.  History of Present Illness   Cynthia Sims is a 73 year old female who presents with persistent right leg pain post-stent placement.  She underwent vascular surgery in May with two additional stents placed in her right leg. Since then, she experiences persistent pain radiating from the back of her leg to her buttock, more severe than pre-surgery discomfort. The pain is constant, unrelieved by movement or rest, and worsens with walking. Intermittent swelling occurs in the affected leg. Tylenol  has been ineffective for pain relief, and gabapentin  100 mg three times daily is not helping. A follow-up last month showed the stent functioning well with no blood clot, but she remains concerned about the possibility of a clot due to ongoing pain and swelling.        Review of Systems  Objective    BP 133/62 (BP Location: Left Arm, Patient Position: Sitting, Cuff Size: Normal)   Pulse 62   Temp (!) 97.5 F (36.4 C) (Oral)   Ht 5' 1 (1.549 m)   Wt 139 lb 8 oz (63.3 kg)   SpO2 100%   BMI 26.36 kg/m  Physical Exam Vitals reviewed.  Constitutional:      General: She is not in acute distress.    Appearance: Normal appearance. She is well-developed. She is not diaphoretic.  HENT:     Head: Normocephalic and atraumatic.   Eyes:     General: No scleral icterus.    Conjunctiva/sclera: Conjunctivae normal.  Neck:     Thyroid : No thyromegaly.  Cardiovascular:     Rate and Rhythm: Normal rate and regular rhythm.     Heart sounds: Normal heart sounds. No murmur heard. Pulmonary:     Effort: Pulmonary effort is normal. No respiratory distress.     Breath sounds: Normal breath sounds. No wheezing, rhonchi or rales.  Musculoskeletal:     Cervical back: Neck supple.     Right lower leg: Edema (trace, non pitting) present.     Left lower leg: No edema.     Comments: Negative Homans Pulse intact in R foot Sensation intact. Positive SLR R  Lymphadenopathy:     Cervical: No cervical adenopathy.  Skin:    General: Skin is warm and dry.     Findings: No rash.  Neurological:     Mental Status: She is alert and oriented to person, place, and time. Mental status is at baseline.  Psychiatric:        Mood and Affect: Mood normal.        Behavior: Behavior normal.       No results found for any visits on 01/03/24.  Assessment & Plan  Problem List Items Addressed This Visit       Endocrine   Diabetic neuropathy (HCC)   Relevant Medications   gabapentin  (NEURONTIN ) 300 MG capsule   Other Visit Diagnoses       Lumbar radicular pain    -  Primary           Postoperative pain and edema following right lower extremity arterial stent placement Persistent postoperative pain and edema in the right lower extremity following arterial stent placement. Pain differs from previous postoperative experiences. Swelling is present but not indicative of a blood clot. Good blood flow is confirmed, and the stent is functioning properly. Pain is expected to improve as blood flow normalizes. - Monitor for significant increase in swelling, which could indicate a blood clot. - Advise to report if symptoms worsen or change.  Right lower extremity sciatica Sciatica in the right lower extremity, likely due to a pinched  nerve, causing pain from the buttock down the leg. Pain is exacerbated by different positions, walking, and standing. The condition is distinct from postoperative pain and likely contributes to increased pain after recent arterial stent placement. - Increase gabapentin  to 300 mg three times a day. - Provide back exercises to help stretch the nerve area. - Advise to stop exercises if she causes leg pain.       Meds ordered this encounter  Medications   gabapentin  (NEURONTIN ) 300 MG capsule    Sig: Take 1 capsule (300 mg total) by mouth 3 (three) times daily.    Dispense:  90 capsule    Refill:  1     Return if symptoms worsen or fail to improve.      Jon Eva, MD  [facility] [phone] (phone) [phone] (fax)  [facility]

## 2024-01-07 ENCOUNTER — Encounter: Payer: Self-pay | Admitting: Podiatry

## 2024-01-07 ENCOUNTER — Ambulatory Visit: Admitting: Podiatry

## 2024-01-07 ENCOUNTER — Other Ambulatory Visit: Payer: Self-pay | Admitting: Cardiovascular Disease

## 2024-01-07 DIAGNOSIS — L853 Xerosis cutis: Secondary | ICD-10-CM

## 2024-01-07 DIAGNOSIS — E0843 Diabetes mellitus due to underlying condition with diabetic autonomic (poly)neuropathy: Secondary | ICD-10-CM

## 2024-01-07 DIAGNOSIS — M79675 Pain in left toe(s): Secondary | ICD-10-CM

## 2024-01-07 DIAGNOSIS — M79674 Pain in right toe(s): Secondary | ICD-10-CM | POA: Diagnosis not present

## 2024-01-07 DIAGNOSIS — I739 Peripheral vascular disease, unspecified: Secondary | ICD-10-CM | POA: Diagnosis not present

## 2024-01-07 DIAGNOSIS — B351 Tinea unguium: Secondary | ICD-10-CM | POA: Diagnosis not present

## 2024-01-07 NOTE — Patient Instructions (Signed)
   For dry skin, recommend daily use of Bag Balm Hand and Body Moisturizer which may be purchased at local drug store or on [employer].

## 2024-01-07 NOTE — Progress Notes (Signed)
 Subjective:  Patient ID: Cynthia Sims, female    DOB: [birth year],  MRN: [id number]  Cynthia Sims presents to clinic today for at risk footcare. Patient has h/o diabetes, neuropathy and PAD and is seen for  and painful mycotic toenails of both feet that are difficult to trim. Pain interferes with daily activities and wearing enclosed shoe gear comfortably. Patient has seen Vascular since her last visit with us  and has had intervention performed on her RLE. States she will need to have it done on her LLE in the future. Next Vascular appointment is in September. Also has some right lumbar radiculopathy which is also being managed.  Chief Complaint  Patient presents with   [facility]    Rm 2 Diabetic foot care/ A1c 6.5/ Blood sugar 96 /PCP Dr. Lang- Marcine last visit May 2025   New problem(s): Has had some dry skin and did pick some off the side of her left foot. Denies any redness, drainage or swelling  PCP is Simmons-Robinson, Rockie, MD. Cynthia Sims 08/27/2023.  No Known Allergies  Review of Systems: Negative except as noted in the HPI.  Objective: No changes noted in today's physical examination. There were no vitals filed for this visit. Cynthia Sims is a pleasant 73 y.o. female WD, WN in NAD. AAO x 3.  Vascular Examination: CFT <3 seconds b/l. DP/PT pulses faintly palpable b/l. Digital hair diminished.  Skin temperature gradient warm to warm b/l. No pain with calf compression. No ischemia or gangrene. No cyanosis or clubbing noted b/l. Trace edema noted BLE.   Neurological Examination: Pt has subjective symptoms of neuropathy. Protective sensation diminished with 10g monofilament b/l.  Dermatological Examination: Pedal skin warm and supple b/l.   No open wounds. No interdigital macerations.  Toenails 1-5 b/l thick, discolored, elongated with subungual debris and pain on dorsal palpation.    Minimal hyperkeratos(is/es) noted medial IPJ of right great toe, plantar IPJ of left great  toe, and 1st metatarsal head b/l lower extremities. Pedal skin noted to be dry b/l feet.  Musculoskeletal Examination: Muscle strength 5/5 to all lower extremity muscle groups bilaterally. HAV with bunion deformity noted b/l LE. Hammertoe(s) 2-5 b/l.Cynthia Sims No pain, crepitus or joint limitation noted with ROM b/l LE.  Patient ambulates independently without assistive aids.  Radiographs: None  Last A1c:      Latest Ref Rng & Units 10/08/2023    9:08 AM  Hemoglobin A1C  Hemoglobin-A1c 4.8 - 5.6 % 6.2    Assessment/Plan: 1. Pain due to onychomycosis of toenails of both feet   2. Xerosis cutis   3. PAD (peripheral artery disease) (HCC)   4. Diabetes mellitus due to underlying condition with diabetic autonomic neuropathy, unspecified whether long term insulin  use [facility])   Patient with PAD and recent h/o vascular intervention. Also some lumbar radiculopathy being managed by PCP. Patient was evaluated and treated. All patient's and/or POA's questions/concerns addressed on today's visit. Toenails 1-5 debrided in length and girth without incident. Mild hyperkeratosis filed gently with dremel  medial IPJ b/l great toes and 1st metatarsal heads b/l. Continue foot and shoe inspections daily. Monitor blood glucose per PCP/Endocrinologist's recommendations. Continue soft, supportive shoe gear daily.  -For dry skin, recommended daily use of Bag Balm Hand and Body Moistuizer which may be purchased at local drug store or on [employer]. -Patient/POA to call should there be question/concern in the interim.   Return in about 3 months (around 04/08/2024).  Cynthia Sims, DPM  [facility] LOCATION: [address] [place] [address]                   Office [phone]   [facility] LOCATION: [address] [place], [place] [address] Office [phone]

## 2024-01-12 ENCOUNTER — Other Ambulatory Visit (INDEPENDENT_AMBULATORY_CARE_PROVIDER_SITE_OTHER): Payer: Self-pay | Admitting: Nurse Practitioner

## 2024-01-14 ENCOUNTER — Telehealth (INDEPENDENT_AMBULATORY_CARE_PROVIDER_SITE_OTHER): Payer: Self-pay

## 2024-01-14 NOTE — Telephone Encounter (Signed)
 We can bring her in for ABIs to see if this pain is vascular in nature, it may be her lower back causing her issues

## 2024-01-14 NOTE — Telephone Encounter (Signed)
 Spoke to patient in reference to Right leg pain, patient stated her pain is currently 8/10 in this leg. She states the leg is purplish in color and is warm to the touch, pain radiates from calf to under her buttocks at this time. Patient seen her PCP for the pain and they upped her dose of Gabapentin  to 300 mg 3 times a day. Please advise.

## 2024-01-15 NOTE — Telephone Encounter (Signed)
 Patient left voicemail after hours yesterday returning nurse call from yesterday. Returned patients call this morning with provider advice to come into the office for appintment with ABI. Patient has been scheduled at this time for Thursday 01/17/24 @ 11AM. Patient is aware of her New appointment date and time. Patient was satisfied with her appointment stated she would call back if pain worsens.

## 2024-01-16 ENCOUNTER — Other Ambulatory Visit (INDEPENDENT_AMBULATORY_CARE_PROVIDER_SITE_OTHER): Payer: Self-pay | Admitting: Vascular Surgery

## 2024-01-16 DIAGNOSIS — I739 Peripheral vascular disease, unspecified: Secondary | ICD-10-CM

## 2024-01-17 ENCOUNTER — Encounter (INDEPENDENT_AMBULATORY_CARE_PROVIDER_SITE_OTHER): Payer: Self-pay | Admitting: Vascular Surgery

## 2024-01-17 ENCOUNTER — Ambulatory Visit (INDEPENDENT_AMBULATORY_CARE_PROVIDER_SITE_OTHER)

## 2024-01-17 ENCOUNTER — Ambulatory Visit (INDEPENDENT_AMBULATORY_CARE_PROVIDER_SITE_OTHER): Admitting: Vascular Surgery

## 2024-01-17 VITALS — BP 123/79 | HR 73 | Resp 16 | Ht 61.0 in | Wt 140.4 lb

## 2024-01-17 DIAGNOSIS — M79604 Pain in right leg: Secondary | ICD-10-CM | POA: Diagnosis not present

## 2024-01-17 DIAGNOSIS — E1169 Type 2 diabetes mellitus with other specified complication: Secondary | ICD-10-CM | POA: Diagnosis not present

## 2024-01-17 DIAGNOSIS — I70213 Atherosclerosis of native arteries of extremities with intermittent claudication, bilateral legs: Secondary | ICD-10-CM | POA: Diagnosis not present

## 2024-01-17 DIAGNOSIS — M79605 Pain in left leg: Secondary | ICD-10-CM | POA: Diagnosis not present

## 2024-01-17 DIAGNOSIS — E785 Hyperlipidemia, unspecified: Secondary | ICD-10-CM

## 2024-01-17 DIAGNOSIS — I10 Essential (primary) hypertension: Secondary | ICD-10-CM | POA: Diagnosis not present

## 2024-01-17 DIAGNOSIS — I2583 Coronary atherosclerosis due to lipid rich plaque: Secondary | ICD-10-CM

## 2024-01-17 DIAGNOSIS — I2510 Atherosclerotic heart disease of native coronary artery without angina pectoris: Secondary | ICD-10-CM

## 2024-01-17 DIAGNOSIS — I739 Peripheral vascular disease, unspecified: Secondary | ICD-10-CM

## 2024-01-17 DIAGNOSIS — Z9889 Other specified postprocedural states: Secondary | ICD-10-CM

## 2024-01-19 ENCOUNTER — Encounter (INDEPENDENT_AMBULATORY_CARE_PROVIDER_SITE_OTHER): Payer: Self-pay | Admitting: Vascular Surgery

## 2024-01-19 DIAGNOSIS — M79606 Pain in leg, unspecified: Secondary | ICD-10-CM | POA: Insufficient documentation

## 2024-01-19 DIAGNOSIS — G8929 Other chronic pain: Secondary | ICD-10-CM | POA: Insufficient documentation

## 2024-01-19 NOTE — Progress Notes (Signed)
 MRN : [id number]  Cynthia Sims is a 73 y.o. (1951/05/08) female who presents with chief complaint of check circulation.  History of Present Illness:   The patient returns to the office for followup regarding atherosclerotic changes of the lower extremities and review of the noninvasive studies.   There have been no interval changes in lower extremity symptoms. No interval shortening of the patient's claudication distance or development of rest pain symptoms. No new ulcers or wounds have occurred since the last visit.  There have been no significant changes to the patient's overall health care.  The patient denies amaurosis fugax or recent TIA symptoms. There are no documented recent neurological changes noted. There is no history of DVT, PE or superficial thrombophlebitis. The patient denies recent episodes of angina or shortness of breath.   ABI Rt=1.14 and Lt=1.15  (previous ABI's Rt=1.08 and Lt=1.01) Duplex ultrasound of the widely patent bilateral SFA stents  Current Meds  Medication Sig   acetaminophen  (TYLENOL ) 500 MG tablet Take 500 mg by mouth every 6 (six) hours as needed.    aspirin  EC 81 MG tablet Take 81 mg by mouth daily.    carvedilol  (COREG ) 25 MG tablet Take 1 tablet by mouth twice daily   cholestyramine  (QUESTRAN ) 4 g packet DISSOLVE AND TAKE ONE PACKET BY MOUTH THREE TIMES DAILY   clopidogrel  (PLAVIX ) 75 MG tablet Take 1 tablet by mouth once daily   cyanocobalamin  1000 MCG tablet Take 1,000 mcg by mouth daily.   DULoxetine  (CYMBALTA ) 60 MG capsule Take 1 capsule by mouth once daily   gabapentin  (NEURONTIN ) 300 MG capsule Take 1 capsule (300 mg total) by mouth 3 (three) times daily.   GVOKE HYPOPEN 2-PACK 1 MG/0.2ML SOAJ Inject 1 mg as directed as directed.  INJECT 1 PEN IN CASE OF SEVERE HYPOGLYCEMIA   Insulin  Glargine (BASAGLAR  KWIKPEN) 100 UNIT/ML Inject 24 Units into the skin daily. INJECT  24 UNITS SUBCUTANEOUSLY ONCE DAILY   insulin  glargine (LANTUS ) 100 UNIT/ML Solostar Pen Inject 24 Units into the skin daily.   isosorbide  mononitrate (IMDUR ) 30 MG 24 hr tablet Take 1 tablet by mouth once daily   lovastatin  (MEVACOR ) 40 MG tablet Take 1 tablet by mouth once daily   metFORMIN  (GLUCOPHAGE ) 500 MG tablet TAKE 1 TABLET BY MOUTH TWICE DAILY WITH A MEAL   nitroGLYCERIN  (NITROSTAT ) 0.4 MG SL tablet Place 1 tablet (0.4 mg total) under the tongue every 5 (five) minutes as needed for chest pain.   ondansetron  (ZOFRAN -ODT) 4 MG disintegrating tablet Take 1 tablet (4 mg total) by mouth every 8 (eight) hours as needed for nausea or vomiting.   pantoprazole  (PROTONIX ) 40 MG tablet Take 1 tablet by mouth once daily   Semaglutide,0.25 or 0.5MG /DOS, 2 MG/3ML SOPN Inject into the skin.   trimethoprim  (TRIMPEX ) 100 MG tablet Take 1 tablet (100 mg total) by mouth daily.    Past Medical History:  Diagnosis Date   ALT (SGPT) level raised    Anemia    Anxiety    Arthritis    Depression    Diabetes mellitus without complication (HCC)  Elevated cholesterol    GERD (gastroesophageal reflux disease)    Hemorrhoids    Hypertension    Kidney disease     Past Surgical History:  Procedure Laterality Date   abdominal blockage     CESAREAN SECTION  1983   CESAREAN SECTION     CHOLECYSTECTOMY     COLONOSCOPY WITH PROPOFOL  N/A 10/29/2015   Procedure: COLONOSCOPY WITH PROPOFOL ;  Surgeon: Lamar ONEIDA Holmes, MD;  Location: [facility] ENDOSCOPY;  Service: Endoscopy;  Laterality: N/A;   COLONOSCOPY WITH PROPOFOL  N/A 11/07/2017   Procedure: COLONOSCOPY WITH PROPOFOL ;  Surgeon: Toledo, Ladell POUR, MD;  Location: [facility] ENDOSCOPY;  Service: Gastroenterology;  Laterality: N/A;   DILATION AND CURETTAGE OF UTERUS  2001   ESOPHAGOGASTRODUODENOSCOPY (EGD) WITH PROPOFOL  N/A 10/29/2015   Procedure: ESOPHAGOGASTRODUODENOSCOPY (EGD) WITH PROPOFOL ;  Surgeon: Lamar ONEIDA Holmes, MD;  Location: [facility] ENDOSCOPY;  Service:  Endoscopy;  Laterality: N/A;   LOWER EXTREMITY ANGIOGRAPHY Right 10/24/2022   Procedure: Lower Extremity Angiography;  Surgeon: Jama Cordella MATSU, MD;  Location: [facility] INVASIVE CV LAB;  Service: Cardiovascular;  Laterality: Right;   LOWER EXTREMITY ANGIOGRAPHY Left 10/31/2022   Procedure: Lower Extremity Angiography;  Surgeon: Jama Cordella MATSU, MD;  Location: [facility] INVASIVE CV LAB;  Service: Cardiovascular;  Laterality: Left;   LOWER EXTREMITY ANGIOGRAPHY Right 10/23/2023   Procedure: Lower Extremity Angiography;  Surgeon: Jama Cordella MATSU, MD;  Location: [facility] INVASIVE CV LAB;  Service: Cardiovascular;  Laterality: Right;   LOWER EXTREMITY INTERVENTION Right 10/23/2023   Procedure: LOWER EXTREMITY INTERVENTION;  Surgeon: Jama Cordella MATSU, MD;  Location: [facility] INVASIVE CV LAB;  Service: Cardiovascular;  Laterality: Right;    Social History Social History   Tobacco Use   Smoking status: Never    Passive exposure: Never   Smokeless tobacco: Never  Vaping Use   Vaping status: Never Used  Substance Use Topics   Alcohol use: Not Currently   Drug use: Never    Family History Family History  Problem Relation Age of Onset   Alzheimer's disease Mother    COPD Mother    Breast cancer Mother 67   Osteoporosis Mother    Heart attack Father    Lupus Father    Diabetes Father    Hypertension Father    Diabetes Sister    COPD Maternal Grandmother    Diabetes Paternal Grandfather    Diabetes Sister    Diabetes Sister     No Known Allergies   REVIEW OF SYSTEMS (Negative unless checked)  Constitutional: [] Weight loss  [] Fever  [] Chills Cardiac: [] Chest pain   [] Chest pressure   [] Palpitations   [] Shortness of breath when laying flat   [] Shortness of breath with exertion. Vascular:  [x] Pain in legs with walking   [] Pain in legs at rest  [] History of DVT   [] Phlebitis   [] Swelling in legs   [] Varicose veins   [] Non-healing ulcers Pulmonary:   [] Uses home oxygen   [] Productive cough    [] Hemoptysis   [] Wheeze  [] COPD   [] Asthma Neurologic:  [] Dizziness   [] Seizures   [] History of stroke   [] History of TIA  [] Aphasia   [] Vissual changes   [] Weakness or numbness in arm   [] Weakness or numbness in leg Musculoskeletal:   [] Joint swelling   [] Joint pain   [] Low back pain Hematologic:  [] Easy bruising  [] Easy bleeding   [] Hypercoagulable state   [] Anemic Gastrointestinal:  [] Diarrhea   [] Vomiting  [] Gastroesophageal reflux/heartburn   [] Difficulty swallowing. Genitourinary:  [] Chronic kidney disease   []   Difficult urination  [] Frequent urination   [] Blood in urine Skin:  [] Rashes   [] Ulcers  Psychological:  [] History of anxiety   []  History of major depression.  Physical Examination  Vitals:   01/17/24 1130  BP: 123/79  Pulse: 73  Resp: 16  Weight: 140 lb 6.4 oz (63.7 kg)  Height: 5' 1 (1.549 m)   Body mass index is 26.53 kg/m. Gen: WD/WN, NAD Head: [place]/AT, No temporalis wasting.  Ear/Nose/Throat: Hearing grossly intact, nares w/o erythema or drainage Eyes: PER, EOMI, sclera nonicteric.  Neck: Supple, no masses.  No bruit or JVD.  Pulmonary:  Good air movement, no audible wheezing, no use of accessory muscles.  Cardiac: RRR, normal S1, S2, no Murmurs. Vascular:  mild trophic changes, no open wounds Vessel Right Left  Radial Palpable Palpable  PT Not Palpable Not Palpable  DP Not Palpable Not Palpable  Gastrointestinal: soft, non-distended. No guarding/no peritoneal signs.  Musculoskeletal: M/S 5/5 throughout.  No visible deformity.  Neurologic: CN 2-12 intact. Pain and light touch intact in extremities.  Symmetrical.  Speech is fluent. Motor exam as listed above. Psychiatric: Judgment intact, Mood & affect appropriate for pt's clinical situation. Dermatologic: No rashes or ulcers noted.  No changes consistent with cellulitis.   CBC Lab Results  Component Value Date   WBC 7.1 05/28/2023   HGB 10.7 (L) 05/28/2023   HCT 32.7 (L) 05/28/2023   MCV 91 05/28/2023    PLT 199 05/28/2023    BMET    Component Value Date/Time   NA 142 10/08/2023 0908   NA 135 (L) 03/01/2012 2236   K 4.3 10/08/2023 0908   K 4.4 03/01/2012 2236   CL 110 (H) 10/08/2023 0908   CL 103 03/01/2012 2236   CO2 14 (L) 10/08/2023 0908   CO2 18 (L) 03/01/2012 2236   GLUCOSE 123 (H) 10/08/2023 0908   GLUCOSE 120 (H) 05/21/2023 0451   GLUCOSE 494 (H) 03/01/2012 2236   BUN 21 10/23/2023 0727   BUN 20 10/08/2023 0908   BUN 23 (H) 03/01/2012 2236   CREATININE 1.55 (H) 10/23/2023 0727   CREATININE 1.72 (H) 03/01/2012 2236   CALCIUM 7.6 (L) 10/08/2023 0908   CALCIUM 8.3 (L) 03/01/2012 2236   GFRNONAA 35 (L) 10/23/2023 0727   GFRNONAA 31 (L) 03/01/2012 2236   GFRAA 41 (L) 10/06/2019 1150   GFRAA 36 (L) 03/01/2012 2236   CrCl cannot be calculated (Patient's most recent lab result is older than the maximum 21 days allowed.).  COAG No results found for: INR, PROTIME  Radiology No results found.   Assessment/Plan 1. Pain in both lower extremities (Primary) Recommend:  The patient has atypical pain symptoms for vascular disease and on exam I do not find evidence of vascular pathology that would explain the patient's symptoms.  Noninvasive studies do not identify significant vascular problems  I suspect the patient is c/o pseudoclaudication.  Patient should have an evaluation of the LS spine which I defer to the primary service, pain management or the Spine service.  The patient should continue walking and begin a more formal exercise program. The patient should continue his antiplatelet therapy and aggressive treatment of the lipid abnormalities.  - Ambulatory referral to Pain Clinic - VAS US  LOWER EXTREMITY ARTERIAL DUPLEX; Future - VAS US  ABI WITH/WO TBI; Future  2. Atherosclerosis of native artery of both lower extremities with intermittent claudication (HCC)  Recommend:  The patient has evidence of atherosclerosis of the lower extremities with claudication.   The  patient does not voice lifestyle limiting changes at this point in time.  Noninvasive studies do not suggest clinically significant change.  No invasive studies, angiography or surgery at this time The patient should continue walking and begin a more formal exercise program.  The patient should continue antiplatelet therapy and aggressive treatment of the lipid abnormalities  No changes in the patient's medications at this time  Continued surveillance is indicated as atherosclerosis is likely to progress with time.    The patient will continue follow up with noninvasive studies as ordered.  - VAS US  LOWER EXTREMITY ARTERIAL DUPLEX; Future - VAS US  ABI WITH/WO TBI; Future  3. Coronary artery disease due to lipid rich plaque Continue cardiac and antihypertensive medications as already ordered and reviewed, no changes at this time.  Continue statin as ordered and reviewed, no changes at this time  Nitrates PRN for chest pain  4. Essential hypertension Continue antihypertensive medications as already ordered, these medications have been reviewed and there are no changes at this time.  5. Type 2 diabetes mellitus with hyperlipidemia (HCC) Continue hypoglycemic medications as already ordered, these medications have been reviewed and there are no changes at this time.  Hgb A1C to be monitored as already arranged by primary service    Cordella Shawl, MD  01/19/2024 2:31 PM

## 2024-01-21 LAB — VAS US ABI WITH/WO TBI
Left ABI: 1.15
Right ABI: 1.14

## 2024-01-28 DIAGNOSIS — E1142 Type 2 diabetes mellitus with diabetic polyneuropathy: Secondary | ICD-10-CM | POA: Diagnosis not present

## 2024-01-31 DIAGNOSIS — E113291 Type 2 diabetes mellitus with mild nonproliferative diabetic retinopathy without macular edema, right eye: Secondary | ICD-10-CM | POA: Diagnosis not present

## 2024-01-31 DIAGNOSIS — H01006 Unspecified blepharitis left eye, unspecified eyelid: Secondary | ICD-10-CM | POA: Diagnosis not present

## 2024-01-31 DIAGNOSIS — Z860101 Personal history of adenomatous and serrated colon polyps: Secondary | ICD-10-CM | POA: Diagnosis not present

## 2024-01-31 DIAGNOSIS — H01003 Unspecified blepharitis right eye, unspecified eyelid: Secondary | ICD-10-CM | POA: Diagnosis not present

## 2024-01-31 DIAGNOSIS — Z7902 Long term (current) use of antithrombotics/antiplatelets: Secondary | ICD-10-CM | POA: Diagnosis not present

## 2024-01-31 DIAGNOSIS — R1310 Dysphagia, unspecified: Secondary | ICD-10-CM | POA: Diagnosis not present

## 2024-02-11 ENCOUNTER — Other Ambulatory Visit (INDEPENDENT_AMBULATORY_CARE_PROVIDER_SITE_OTHER): Payer: Self-pay | Admitting: Nurse Practitioner

## 2024-02-12 ENCOUNTER — Encounter: Payer: Self-pay | Admitting: Internal Medicine

## 2024-02-12 ENCOUNTER — Ambulatory Visit

## 2024-02-12 DIAGNOSIS — Z780 Asymptomatic menopausal state: Secondary | ICD-10-CM

## 2024-02-12 DIAGNOSIS — Z0000 Encounter for general adult medical examination without abnormal findings: Secondary | ICD-10-CM

## 2024-02-12 DIAGNOSIS — E1169 Type 2 diabetes mellitus with other specified complication: Secondary | ICD-10-CM

## 2024-02-12 NOTE — Patient Instructions (Signed)
 Cynthia Sims , Thank you for taking time out of your busy schedule to complete your Annual Wellness Visit with me. I enjoyed our conversation and look forward to speaking with you again next year. I, as well as your care team,  appreciate your ongoing commitment to your health goals. Please review the following plan we discussed and let me know if I can assist you in the future.  REFERRAL SENT FOR BONE DENSITY SCAN You have an order for:  []   2D Mammogram  []   3D Mammogram  [x]   Bone Density     Please call for appointment:  [facility]  [address] [place] [address] [phone] [facility] [address] [place], [place] [address] [phone] [facility] [address] [place], [place] [address] [phone]   Make sure to wear two-piece clothing.  No lotions, powders, or deodorants the day of the appointment. Make sure to bring picture ID and insurance card.  Bring list of medications you are currently taking including any supplements.   Schedule your [place] screening mammogram through MyChart!   Log into your MyChart account.  Go to 'Visit' (or 'Appointments' if on mobile App) --> Schedule an Appointment  Under 'Select a Reason for Visit' choose the Mammogram Screening option.  Complete the pre-visit questions and select the time and place that best fits your schedule.   Follow up Visits: We will see or speak with you next year for your Next Medicare AWV with our clinical staff Have you seen your provider in the last 6 months (3 months if uncontrolled diabetes)? Yes  Clinician Recommendations:  Aim for 30 minutes of exercise or brisk walking, 6-8 glasses of water, and 5 servings of fruits and vegetables each day. TAKE CARE!      This is a list of the screenings recommended for you:  Health Maintenance  Topic Date Due   DEXA scan  (bone density measurement)  08/31/2020   Zoster (Shingles) Vaccine (2 of 2) 08/26/2021   Colon Cancer Screening  11/08/2022   Yearly kidney health urinalysis for diabetes  06/10/2023   Flu Shot  01/11/2024   COVID-19 Vaccine (5 - 2025-26 season) 02/11/2024   Hemoglobin A1C  04/08/2024   Complete foot exam   05/27/2024   DTaP/Tdap/Td vaccine (2 - Td or Tdap) 09/24/2024   Mammogram  10/14/2024   Yearly kidney function blood test for diabetes  10/22/2024   Eye exam for diabetics  11/07/2024   Medicare Annual Wellness Visit  02/11/2025   Pneumococcal Vaccine for age over 52  Completed   Hepatitis C Screening  Completed   HPV Vaccine  Aged Out   Meningitis B Vaccine  Aged Out    Advanced directives: (ACP Link)Information on Advanced Care Planning can be found at [place]  Print production planner Health Care Directives Advance Health Care Directives. [email or web address]  Advance Care Planning is important because it:  [x]  Makes sure you receive the medical care that is consistent with your values, goals, and preferences  [x]  It provides guidance to your family and loved ones and reduces their decisional burden about whether or not they are making the right decisions based on your wishes.  Follow the link provided in your after visit summary or read over the paperwork we have mailed to you to help you started getting your Advance Directives in place. If you  need assistance in completing these, please reach out to us  so that we can help you!

## 2024-02-12 NOTE — Progress Notes (Signed)
 Subjective:   Cynthia Sims is a 73 y.o. who presents for a Medicare Wellness preventive visit.  As a reminder, Annual Wellness Visits don't include a physical exam, and some assessments may be limited, especially if this visit is performed virtually. We may recommend an in-person follow-up visit with your provider if needed.  Visit Complete: Virtual I connected with  Cynthia Sims on 02/12/24 by a audio enabled telemedicine application and verified that I am speaking with the correct person using two identifiers.  Patient Location: Home  Provider Location: Home Office  I discussed the limitations of evaluation and management by telemedicine. The patient expressed understanding and agreed to proceed.  Vital Signs: Because this visit was a virtual/telehealth visit, some criteria may be missing or patient reported. Any vitals not documented were not able to be obtained and vitals that have been documented are patient reported.  VideoDeclined- This patient declined Librarian, academic. Therefore the visit was completed with audio only.  Persons Participating in Visit: Patient.  AWV Questionnaire: No: Patient Medicare AWV questionnaire was not completed prior to this visit.  Cardiac Risk Factors include: advanced age (>3men, >28 women);diabetes mellitus;dyslipidemia;hypertension;sedentary lifestyle     Objective:    Today's Vitals   02/12/24 1055  PainSc: 4    There is no height or weight on file to calculate BMI.     02/12/2024   11:01 AM 10/23/2023    7:20 AM 10/02/2023   12:47 PM 05/17/2023    9:39 PM 02/26/2023   10:49 PM 10/04/2022    2:10 PM 09/17/2019    1:48 PM  Advanced Directives  Does Patient Have a Medical Advance Directive? No No Yes No No No Yes  Type of Advance Directive   Living will    Healthcare Power of [place];Living will  Copy of Healthcare Power of Attorney in Chart?       No - copy requested  Would patient like information on  creating a medical advance directive? No - Patient declined Yes (ED - Information included in AVS)   No - Patient declined      Current Medications (verified) Outpatient Encounter Medications as of 02/12/2024  Medication Sig   acetaminophen  (TYLENOL ) 500 MG tablet Take 500 mg by mouth every 6 (six) hours as needed.    aspirin  EC 81 MG tablet Take 81 mg by mouth daily.    carvedilol  (COREG ) 25 MG tablet Take 1 tablet by mouth twice daily   cholestyramine  (QUESTRAN ) 4 g packet DISSOLVE AND TAKE ONE PACKET BY MOUTH THREE TIMES DAILY   clopidogrel  (PLAVIX ) 75 MG tablet Take 1 tablet by mouth once daily   cyanocobalamin  1000 MCG tablet Take 1,000 mcg by mouth daily.   DULoxetine  (CYMBALTA ) 60 MG capsule Take 1 capsule by mouth once daily   gabapentin  (NEURONTIN ) 300 MG capsule Take 1 capsule (300 mg total) by mouth 3 (three) times daily.   GVOKE HYPOPEN 2-PACK 1 MG/0.2ML SOAJ Inject 1 mg as directed as directed.  INJECT 1 PEN IN CASE OF SEVERE HYPOGLYCEMIA   Insulin  Glargine (BASAGLAR  KWIKPEN) 100 UNIT/ML Inject 24 Units into the skin daily. INJECT 24 UNITS SUBCUTANEOUSLY ONCE DAILY   insulin  glargine (LANTUS ) 100 UNIT/ML Solostar Pen Inject 24 Units into the skin daily.   isosorbide  mononitrate (IMDUR ) 30 MG 24 hr tablet Take 1 tablet by mouth once daily   lovastatin  (MEVACOR ) 40 MG tablet Take 1 tablet by mouth once daily   metFORMIN  (GLUCOPHAGE ) 500 MG tablet  TAKE 1 TABLET BY MOUTH TWICE DAILY WITH A MEAL   nitroGLYCERIN  (NITROSTAT ) 0.4 MG SL tablet Place 1 tablet (0.4 mg total) under the tongue every 5 (five) minutes as needed for chest pain.   ondansetron  (ZOFRAN -ODT) 4 MG disintegrating tablet Take 1 tablet (4 mg total) by mouth every 8 (eight) hours as needed for nausea or vomiting.   pantoprazole  (PROTONIX ) 40 MG tablet Take 1 tablet by mouth once daily   Semaglutide,0.25 or 0.5MG /DOS, 2 MG/3ML SOPN Inject into the skin.   trimethoprim  (TRIMPEX ) 100 MG tablet Take 1 tablet (100 mg total) by  mouth daily.   No facility-administered encounter medications on file as of 02/12/2024.    Allergies (verified) Patient has no known allergies.   History: Past Medical History:  Diagnosis Date   ALT (SGPT) level raised    Anemia    Anxiety    Arthritis    Depression    Diabetes mellitus without complication (HCC)    Elevated cholesterol    GERD (gastroesophageal reflux disease)    Hemorrhoids    Hypertension    Kidney disease    Past Surgical History:  Procedure Laterality Date   abdominal blockage     CESAREAN SECTION  1983   CESAREAN SECTION     CHOLECYSTECTOMY     COLONOSCOPY WITH PROPOFOL  N/A 10/29/2015   Procedure: COLONOSCOPY WITH PROPOFOL ;  Surgeon: Lamar ONEIDA Holmes, MD;  Location: [facility] ENDOSCOPY;  Service: Endoscopy;  Laterality: N/A;   COLONOSCOPY WITH PROPOFOL  N/A 11/07/2017   Procedure: COLONOSCOPY WITH PROPOFOL ;  Surgeon: Toledo, Ladell POUR, MD;  Location: [facility] ENDOSCOPY;  Service: Gastroenterology;  Laterality: N/A;   DILATION AND CURETTAGE OF UTERUS  2001   ESOPHAGOGASTRODUODENOSCOPY (EGD) WITH PROPOFOL  N/A 10/29/2015   Procedure: ESOPHAGOGASTRODUODENOSCOPY (EGD) WITH PROPOFOL ;  Surgeon: Lamar ONEIDA Holmes, MD;  Location[facility]COPY;  Service: Endoscopy;  Laterality: N/A;   LOWER EXTREMITY ANGIOGRAPHY Right 10/24/2022   Procedure: Lower Extremity Angiography;  Surgeon: Jama Cordella MATSU, MD;  Location: [facility] INVASIVE CV LAB;  Service: Cardiovascular;  Laterality: Right;   LOWER EXTREMITY ANGIOGRAPHY Left 10/31/2022   Procedure: Lower Extremity Angiography;  Surgeon: Jama Cordella MATSU, MD;  Location: [facility] INVASIVE CV LAB;  Service: Cardiovascular;  Laterality: Left;   LOWER EXTREMITY ANGIOGRAPHY Right 10/23/2023   Procedure: Lower Extremity Angiography;  Surgeon: Jama Cordella MATSU, MD;  Location: [facility] INVASIVE CV LAB;  Service: Cardiovascular;  Laterality: Right;   LOWER EXTREMITY INTERVENTION Right 10/23/2023   Procedure: LOWER EXTREMITY INTERVENTION;  Surgeon:  Jama Cordella MATSU, MD;  Location: [facility] INVASIVE CV LAB;  Service: Cardiovascular;  Laterality: Right;   Family History  Problem Relation Age of Onset   Alzheimer's disease Mother    COPD Mother    Breast cancer Mother 23   Osteoporosis Mother    Heart attack Father    Lupus Father    Diabetes Father    Hypertension Father    Diabetes Sister    COPD Maternal Grandmother    Diabetes Paternal Grandfather    Diabetes Sister    Diabetes Sister    Social History   Socioeconomic History   Marital status: Married    Spouse name: Not on file   Number of children: 5   Years of education: Not on file   Highest education level: 8th grade  Occupational History   Occupation: retired  Tobacco Use   Smoking status: Never    Passive exposure: Never   Smokeless tobacco: Never  Vaping Use   Vaping  status: Never Used  Substance and Sexual Activity   Alcohol use: Not Currently   Drug use: Never   Sexual activity: Yes    Birth control/protection: Post-menopausal  Other Topics Concern   Not on file  Social History Narrative   Applied for food stamps. Pt lives with husband, Charlena, and is living off social security money and is raising one of her grandchildren.    Social Drivers of Corporate investment banker Strain: Low Risk  (02/12/2024)   Overall Financial Resource Strain (CARDIA)    Difficulty of Paying Living Expenses: Not hard at all  Food Insecurity: No Food Insecurity (02/12/2024)   Hunger Vital Sign    Worried About Running Out of Food in the Last Year: Never true    Ran Out of Food in the Last Year: Never true  Transportation Needs: No Transportation Needs (02/12/2024)   PRAPARE - Administrator, Civil Service (Medical): No    Lack of Transportation (Non-Medical): No  Physical Activity: Insufficiently Active (02/12/2024)   Exercise Vital Sign    Days of Exercise per Week: 2 days    Minutes of Exercise per Session: 20 min  Stress: No Stress Concern Present (02/12/2024)    [employer] of Occupational Health - Occupational Stress Questionnaire    Feeling of Stress: Not at all  Social Connections: Moderately Integrated (02/12/2024)   Social Connection and Isolation Panel    Frequency of Communication with Friends and Family: More than three times a week    Frequency of Social Gatherings with Friends and Family: Once a week    Attends Religious Services: More than 4 times per year    Active Member of [employer] or Organizations: No    Attends Engineer, structural: Never    Marital Status: Married    Tobacco Counseling Counseling given: Not Answered    Clinical Intake:  Pre-visit preparation completed: Yes  Pain : 0-10 Pain Score: 4  Pain Type: Chronic pain Pain Location: Leg Pain Orientation: Right Pain Descriptors / Indicators: Aching, Constant Pain Onset: More than a month ago Pain Frequency: Constant     BMI - recorded: 26.5 Nutritional Status: BMI 25 -29 Overweight Nutritional Risks: None Diabetes: Yes CBG done?: No Did pt. bring in CBG monitor from home?: No  Lab Results  Component Value Date   HGBA1C 6.2 (H) 10/08/2023   HGBA1C 7.4 (H) 12/25/2022   HGBA1C 6.3 (A) 02/20/2022     How often do you need to have someone help you when you read instructions, pamphlets, or other written materials from your doctor or pharmacy?: 1 - Never  Interpreter Needed?: No  Information entered by :: JHONNIE DAS, LPN   Activities of Daily Living     02/12/2024   11:03 AM 05/19/2023   12:00 PM  In your present state of health, do you have any difficulty performing the following activities:  Hearing? 0   Vision? 0   Difficulty concentrating or making decisions? 0   Walking or climbing stairs? 1   Comment LEG PAIN   Dressing or bathing? 0   Doing errands, shopping? 0 0  Preparing Food and eating ? N   Using the Toilet? N   In the past six months, have you accidently leaked urine? N   Do you have problems with loss of bowel  control? N   Managing your Medications? N   Managing your Finances? N   Housekeeping or managing your Housekeeping? N  Patient Care Team: Sharma Coyer, MD as PCP - General (Family Medicine) Santo Stanly LABOR, MD as PCP - Cardiology (Cardiology) Neill Boas, DPM as Consulting Physician (Podiatry) Gaston Hamilton, MD as Consulting Physician (Urology) Leodis Lenis, MD as Referring Physician (Internal Medicine) Laurice Francis NOVAK, OD (Optometry)  I have updated your Care Teams any recent Medical Services you may have received from other providers in the past year.     Assessment:   This is a routine wellness examination for Cynthia Sims.  Hearing/Vision screen Hearing Screening - Comments:: NO AIDS Vision Screening - Comments:: WEARS GLASSES ALL DAY- DR.NICE- LAST APPT COUPLE WEEKS AGO- NO DIABETIC RETINOPATHY    Goals Addressed             This Visit's Progress    DIET - INCREASE WATER INTAKE         Depression Screen     02/12/2024   10:59 AM 01/03/2024    9:59 AM 10/02/2023   10:22 AM 09/10/2023    2:04 PM 07/30/2023   10:23 AM 05/28/2023    8:55 AM 04/27/2023    8:28 AM  PHQ 2/9 Scores  PHQ - 2 Score 0 1 0 2 2 2 1   PHQ- 9 Score 0 2  5 5 7 3     Fall Risk     02/12/2024   11:02 AM 01/03/2024    9:59 AM 07/30/2023   10:24 AM 01/09/2023   10:48 AM 12/25/2022    8:27 AM  Fall Risk   Falls in the past year? 1 0 0 0 0  Number falls in past yr: 0 0 0 0   Injury with Fall? 0 0 0 0 0  Risk for fall due to : Impaired balance/gait No Fall Risks   No Fall Risks  Follow up Falls evaluation completed;Falls prevention discussed Falls evaluation completed       MEDICARE RISK AT HOME:  Medicare Risk at Home Any stairs in or around the home?: Yes If so, are there any without handrails?: No Home free of loose throw rugs in walkways, pet beds, electrical cords, etc?: Yes Adequate lighting in your home to reduce risk of falls?: Yes Life alert?: No Use of a cane,  walker or w/c?: Yes (CANE OR WALKER SOMETIMES) Grab bars in the bathroom?: No Shower chair or bench in shower?: Yes Elevated toilet seat or a handicapped toilet?: Yes  TIMED UP AND GO:  Was the test performed?  No  Cognitive Function: 6CIT completed    12/24/2017    6:32 PM  MMSE - Mini Mental State Exam  Orientation to time 5  Orientation to Place 5  Registration 3  Attention/ Calculation 5  Recall 3  Language- name 2 objects 2  Language- repeat 1  Language- follow 3 step command 3  Language- read & follow direction 1  Write a sentence 1  Copy design 1  Total score 30        02/12/2024   11:05 AM 10/04/2022    2:16 PM 08/22/2018   10:36 AM  6CIT Screen  What Year? 0 points 0 points 0 points  What month? 0 points 0 points 0 points  What time? 0 points 0 points 0 points  Count back from 20 0 points 0 points 0 points  Months in reverse 0 points 0 points 0 points  Repeat phrase 0 points 0 points 0 points  Total Score 0 points 0 points 0 points    Immunizations Immunization History  Administered Date(s) Administered   Fluad Quad(high Dose 65+) 03/30/2021, 02/20/2022   Fluad Trivalent(High Dose 65+) 03/15/2023   INFLUENZA, HIGH DOSE SEASONAL PF 02/26/2018   Influenza,inj,Quad PF,6+ Mos 03/13/2019   Influenza,inj,quad, With Preservative 04/09/2020   Influenza-Unspecified 03/16/2014, 03/24/2016, 03/12/2017   PFIZER(Purple Top)SARS-COV-2 Vaccination 09/15/2019, 10/06/2019, 04/09/2020   Pfizer Covid-19 Vaccine Bivalent Booster 45yrs & up 07/01/2021   Pneumococcal Conjugate-13 09/25/2014   Pneumococcal Polysaccharide-23 11/16/2015, 02/15/2019   Tdap 09/25/2014   Zoster Recombinant(Shingrix) 07/01/2021    Screening Tests Health Maintenance  Topic Date Due   DEXA SCAN  08/31/2020   Zoster Vaccines- Shingrix (2 of 2) 08/26/2021   Colonoscopy  11/08/2022   Diabetic kidney evaluation - Urine ACR  06/10/2023   INFLUENZA VACCINE  01/11/2024   COVID-19 Vaccine (5 -  2025-26 season) 02/11/2024   HEMOGLOBIN A1C  04/08/2024   FOOT EXAM  05/27/2024   DTaP/Tdap/Td (2 - Td or Tdap) 09/24/2024   MAMMOGRAM  10/14/2024   Diabetic kidney evaluation - eGFR measurement  10/22/2024   OPHTHALMOLOGY EXAM  11/07/2024   Medicare Annual Wellness (AWV)  02/11/2025   Pneumococcal Vaccine: 50+ Years  Completed   Hepatitis C Screening  Completed   HPV VACCINES  Aged Out   Meningococcal B Vaccine  Aged Out    Health Maintenance  Health Maintenance Due  Topic Date Due   DEXA SCAN  08/31/2020   Zoster Vaccines- Shingrix (2 of 2) 08/26/2021   Colonoscopy  11/08/2022   Diabetic kidney evaluation - Urine ACR  06/10/2023   INFLUENZA VACCINE  01/11/2024   COVID-19 Vaccine (5 - 2025-26 season) 02/11/2024   Health Maintenance Items Addressed: HAS APPT FOR COLONOSCOPY NEXT WEEK; BDS ORDERED; UP TO DATE ON SHOTS EXCEPT COVID  Additional Screening:  Vision Screening: Recommended annual ophthalmology exams for early detection of glaucoma and other disorders of the eye. Would you like a referral to an eye doctor? No    Dental Screening: Recommended annual dental exams for proper oral hygiene  Community Resource Referral / Chronic Care Management: CRR required this visit?  No   CCM required this visit?  No   Plan:    I have personally reviewed and noted the following in the patient's chart:   Medical and social history Use of alcohol, tobacco or illicit drugs  Current medications and supplements including opioid prescriptions. Patient is not currently taking opioid prescriptions. Functional ability and status Nutritional status Physical activity Advanced directives List of other physicians Hospitalizations, surgeries, and ER visits in previous 12 months Vitals Screenings to include cognitive, depression, and falls Referrals and appointments  In addition, I have reviewed and discussed with patient certain preventive protocols, quality metrics, and best  practice recommendations. A written personalized care plan for preventive services as well as general preventive health recommendations were provided to patient.   Jhonnie GORMAN Das, LPN   0/12/7972   After Visit Summary: (MyChart) Due to this being a telephonic visit, the after visit summary with patients personalized plan was offered to patient via MyChart   Notes: BDS ORDERED; LAB WORK ORDERED

## 2024-02-13 ENCOUNTER — Other Ambulatory Visit: Payer: Self-pay | Admitting: Family Medicine

## 2024-02-13 DIAGNOSIS — E0842 Diabetes mellitus due to underlying condition with diabetic polyneuropathy: Secondary | ICD-10-CM

## 2024-02-13 DIAGNOSIS — F411 Generalized anxiety disorder: Secondary | ICD-10-CM

## 2024-02-14 ENCOUNTER — Ambulatory Visit (INDEPENDENT_AMBULATORY_CARE_PROVIDER_SITE_OTHER): Admitting: Vascular Surgery

## 2024-02-14 ENCOUNTER — Encounter (INDEPENDENT_AMBULATORY_CARE_PROVIDER_SITE_OTHER)

## 2024-02-15 ENCOUNTER — Telehealth (INDEPENDENT_AMBULATORY_CARE_PROVIDER_SITE_OTHER): Payer: Self-pay | Admitting: Nurse Practitioner

## 2024-02-15 NOTE — Telephone Encounter (Signed)
 Kianie from [facility] [phone]) called. She has faxed request over a couple of times but hasn't heard back from us . Patient has a colonoscopy scheduled on 02/20/24. She needs to know if she can still continue to take her Plavix . Pt needs to know today so she can hold the medication. Please advise

## 2024-02-15 NOTE — Telephone Encounter (Signed)
 Spoke with the receptionist and left a message for Kianie at [facility] regarding the patient stopping the Plavix  before her colonoscopy. Per Dr. Marea the patient should stop 5 days prior to. This recommendation was given to the receptionist.

## 2024-02-20 ENCOUNTER — Ambulatory Visit
Admission: RE | Admit: 2024-02-20 | Discharge: 2024-02-20 | Disposition: A | Discharge status: Home / Self Care | Attending: Internal Medicine | Admitting: Internal Medicine

## 2024-02-20 ENCOUNTER — Ambulatory Visit: Admitting: Certified Registered"

## 2024-02-20 ENCOUNTER — Encounter
Admission: RE | Disposition: A | Discharge status: Home / Self Care | Payer: Self-pay | Source: Home / Self Care | Attending: Internal Medicine

## 2024-02-20 ENCOUNTER — Encounter: Payer: Self-pay | Admitting: Internal Medicine

## 2024-02-20 DIAGNOSIS — I10 Essential (primary) hypertension: Secondary | ICD-10-CM | POA: Insufficient documentation

## 2024-02-20 DIAGNOSIS — Z1211 Encounter for screening for malignant neoplasm of colon: Secondary | ICD-10-CM | POA: Insufficient documentation

## 2024-02-20 DIAGNOSIS — K222 Esophageal obstruction: Secondary | ICD-10-CM | POA: Diagnosis not present

## 2024-02-20 DIAGNOSIS — K219 Gastro-esophageal reflux disease without esophagitis: Secondary | ICD-10-CM | POA: Insufficient documentation

## 2024-02-20 DIAGNOSIS — E1151 Type 2 diabetes mellitus with diabetic peripheral angiopathy without gangrene: Secondary | ICD-10-CM | POA: Insufficient documentation

## 2024-02-20 DIAGNOSIS — Z860101 Personal history of adenomatous and serrated colon polyps: Secondary | ICD-10-CM | POA: Diagnosis not present

## 2024-02-20 DIAGNOSIS — G4730 Sleep apnea, unspecified: Secondary | ICD-10-CM | POA: Insufficient documentation

## 2024-02-20 DIAGNOSIS — Z794 Long term (current) use of insulin: Secondary | ICD-10-CM | POA: Insufficient documentation

## 2024-02-20 DIAGNOSIS — K449 Diaphragmatic hernia without obstruction or gangrene: Secondary | ICD-10-CM | POA: Diagnosis not present

## 2024-02-20 DIAGNOSIS — K5790 Diverticulosis of intestine, part unspecified, without perforation or abscess without bleeding: Secondary | ICD-10-CM | POA: Diagnosis not present

## 2024-02-20 DIAGNOSIS — F419 Anxiety disorder, unspecified: Secondary | ICD-10-CM | POA: Insufficient documentation

## 2024-02-20 DIAGNOSIS — K5730 Diverticulosis of large intestine without perforation or abscess without bleeding: Secondary | ICD-10-CM | POA: Insufficient documentation

## 2024-02-20 DIAGNOSIS — R1310 Dysphagia, unspecified: Secondary | ICD-10-CM | POA: Diagnosis not present

## 2024-02-20 DIAGNOSIS — I2510 Atherosclerotic heart disease of native coronary artery without angina pectoris: Secondary | ICD-10-CM | POA: Diagnosis not present

## 2024-02-20 DIAGNOSIS — Z09 Encounter for follow-up examination after completed treatment for conditions other than malignant neoplasm: Secondary | ICD-10-CM | POA: Diagnosis not present

## 2024-02-20 DIAGNOSIS — Z7984 Long term (current) use of oral hypoglycemic drugs: Secondary | ICD-10-CM | POA: Diagnosis not present

## 2024-02-20 HISTORY — DX: Functional diarrhea: K59.1

## 2024-02-20 HISTORY — DX: Other specified bacterial intestinal infections: A04.8

## 2024-02-20 HISTORY — PX: ESOPHAGOGASTRODUODENOSCOPY: SHX5428

## 2024-02-20 HISTORY — PX: MALONEY DILATION: SHX5535

## 2024-02-20 HISTORY — DX: Sleep apnea, unspecified: G47.30

## 2024-02-20 HISTORY — DX: Personal history of adenomatous and serrated colon polyps: Z86.0101

## 2024-02-20 HISTORY — PX: COLONOSCOPY: SHX5424

## 2024-02-20 LAB — GLUCOSE, CAPILLARY: Glucose-Capillary: 74 mg/dL (ref 70–99)

## 2024-02-20 SURGERY — COLONOSCOPY
Anesthesia: General

## 2024-02-20 MED ORDER — PROPOFOL 10 MG/ML IV BOLUS
INTRAVENOUS | Status: DC | PRN
  Administered 2024-02-20 (×4): 20 mg via INTRAVENOUS
  Administered 2024-02-20: 120 mg via INTRAVENOUS
  Administered 2024-02-20 (×2): 20 mg via INTRAVENOUS
  Administered 2024-02-20: 30 mg via INTRAVENOUS
  Administered 2024-02-20 (×2): 20 mg via INTRAVENOUS

## 2024-02-20 MED ORDER — SODIUM CHLORIDE 0.9 % IV SOLN
INTRAVENOUS | Status: DC
  Administered 2024-02-20: 20 mL/h via INTRAVENOUS

## 2024-02-20 MED ORDER — LIDOCAINE HCL (PF) 2 % IJ SOLN
INTRAMUSCULAR | Status: DC | PRN
  Administered 2024-02-20: 40 mg via INTRADERMAL

## 2024-02-20 MED ORDER — PROPOFOL 1000 MG/100ML IV EMUL
INTRAVENOUS | Status: AC
  Filled 2024-02-20: qty 100

## 2024-02-20 NOTE — Interval H&P Note (Signed)
 History and Physical Interval Note:  02/20/2024 8:17 AM  Cynthia Sims  has presented today for surgery, with the diagnosis of Dysphagia, unspecified type (R13.10) Personal history of adenomatous and serrated colon polyps (Z86.0101) Antiplatelet or antithrombotic long-term use (Z79.02).  The various methods of treatment have been discussed with the patient and family. After consideration of risks, benefits and other options for treatment, the patient has consented to  Procedure(s) with comments: COLONOSCOPY (N/A) - IDDM/Plavix  EGD (ESOPHAGOGASTRODUODENOSCOPY) (N/A) as a surgical intervention.  The patient's history has been reviewed, patient examined, no change in status, stable for surgery.  I have reviewed the patient's chart and labs.  Questions were answered to the patient's satisfaction.     [place], Kinsie Belford

## 2024-02-20 NOTE — H&P (Signed)
 Outpatient short stay form Pre-procedure 02/20/2024 8:16 AM Cynthia Sims K. Aundria, M.D.  Primary Physician: Rockie Agent  Reason for visit:   Dysphagia, unspecified type  Personal history of adenomatous and serrated colon polyps  Antiplatelet or antithrombotic long-term use     History of present illness:  EGD 10/29/2015 for dysphagia: mild Schatzki ring that was dilated, mild gastritis and a small hiatal hernia. She was treated with Pylera for H. pylori prior to the procedure and it was negative on pathology.   11/07/2017: colonoscopy: Indic: functional diarrhea, fecal incontinence. Imp: decreased sphincter tone on DRE, nonbleeding internal hemorrhoids. Colonic mucosa negative for colitis malignancy or dysplasia. Repeat colonoscopy 5 years. Remain on WelChol.   ASSESSMENT AND PLAN: Cynthia Sims is a 73 y.o. female presenting for follow up: Diagnoses and all orders for this visit:  Dysphagia, unspecified type - Ambulatory Referral to Colonoscopy and Upper Endoscopy  Personal history of adenomatous and serrated colon polyps - Ambulatory Referral to Colonoscopy and Upper Endoscopy  Antiplatelet or antithrombotic long-term use - Ambulatory Referral to Colonoscopy and Upper Endoscopy  Other orders - peg-electrolyte (NULYTELY ) solution; Take 4,000 mLs by mouth once for 1 dose  1. Personal history of colon polyps-due for repeat colonoscopy based on history of tubular adenomas. We reviewed this will likely be last colonoscopy. She denies any lower GI symptoms currently. Denies any prior issues with sedation.  2. Dysphagia-having significant dysphagia requiring regurgitation frequently. She endorses dysphagia to solid foods in upper esophageal area and having to regurgitate. She is not feeling classic GERD symptoms. Will schedule endoscopy for evaluation.     Current Facility-Administered Medications:    0.9 %  sodium chloride  infusion, , Intravenous, Continuous, [place], Volney Reierson  K, MD, Last Rate: 20 mL/hr at 02/20/24 0749, 20 mL/hr at 02/20/24 0749  Medications Prior to Admission  Medication Sig Dispense Refill Last Dose/Taking   acetaminophen  (TYLENOL ) 500 MG tablet Take 500 mg by mouth every 6 (six) hours as needed.    02/19/2024   aspirin  EC 81 MG tablet Take 81 mg by mouth daily.    Past Week   carvedilol  (COREG ) 25 MG tablet Take 1 tablet by mouth twice daily 180 tablet 1 02/19/2024   cholestyramine  (QUESTRAN ) 4 g packet DISSOLVE AND TAKE ONE PACKET BY MOUTH THREE TIMES DAILY 270 each 0 02/19/2024   clopidogrel  (PLAVIX ) 75 MG tablet Take 1 tablet by mouth once daily 90 tablet 3 Past Week   cyanocobalamin  1000 MCG tablet Take 1,000 mcg by mouth daily.   02/19/2024   DULoxetine  (CYMBALTA ) 60 MG capsule Take 1 capsule by mouth once daily 90 capsule 0 02/19/2024   gabapentin  (NEURONTIN ) 300 MG capsule Take 1 capsule (300 mg total) by mouth 3 (three) times daily. 90 capsule 1 02/19/2024   GVOKE HYPOPEN 2-PACK 1 MG/0.2ML SOAJ Inject 1 mg as directed as directed.  INJECT 1 PEN IN CASE OF SEVERE HYPOGLYCEMIA   02/19/2024   Insulin  Glargine (BASAGLAR  KWIKPEN) 100 UNIT/ML Inject 24 Units into the skin daily. INJECT 24 UNITS SUBCUTANEOUSLY ONCE DAILY 15 mL 0 02/19/2024   insulin  glargine (LANTUS ) 100 UNIT/ML Solostar Pen Inject 24 Units into the skin daily. 15 mL 2 Past Week   isosorbide  mononitrate (IMDUR ) 30 MG 24 hr tablet Take 1 tablet by mouth once daily 90 tablet 2 02/19/2024   lovastatin  (MEVACOR ) 40 MG tablet Take 1 tablet by mouth once daily 90 tablet 2 02/19/2024   metFORMIN  (GLUCOPHAGE ) 500 MG tablet TAKE 1 TABLET BY MOUTH TWICE DAILY WITH  A MEAL 180 tablet 3 02/19/2024   ondansetron  (ZOFRAN -ODT) 4 MG disintegrating tablet Take 1 tablet (4 mg total) by mouth every 8 (eight) hours as needed for nausea or vomiting. 20 tablet 0 02/19/2024   pantoprazole  (PROTONIX ) 40 MG tablet Take 1 tablet by mouth once daily 30 tablet 0 02/19/2024   Semaglutide,0.25 or 0.5MG /DOS, 2 MG/3ML SOPN Inject into the  skin.   Past Week   trimethoprim  (TRIMPEX ) 100 MG tablet Take 1 tablet (100 mg total) by mouth daily. 30 tablet 11 02/19/2024   nitroGLYCERIN  (NITROSTAT ) 0.4 MG SL tablet Place 1 tablet (0.4 mg total) under the tongue every 5 (five) minutes as needed for chest pain. 25 tablet 3      No Known Allergies   Past Medical History:  Diagnosis Date   ALT (SGPT) level raised    Anemia    Anxiety    Arthritis    Depression    Diabetes mellitus without complication (HCC)    Diarrhea, functional    Elevated cholesterol    GERD (gastroesophageal reflux disease)    H. pylori infection    H/O adenomatous polyp of colon    Hemorrhoids    Hypertension    Kidney disease    Sleep apnea     Review of systems:  Otherwise negative.    Physical Exam  Gen: Alert, oriented. Appears stated age.  HEENT: [place]/AT. PERRLA. Lungs: CTA, no wheezes. CV: RR nl S1, S2. Abd: soft, benign, no masses. BS+ Ext: No edema. Pulses 2+    Planned procedures: Proceed with EGD and colonoscopy. The patient understands the nature of the planned procedure, indications, risks, alternatives and potential complications including but not limited to bleeding, infection, perforation, damage to internal organs and possible oversedation/side effects from anesthesia. The patient agrees and gives consent to proceed.  Please refer to procedure notes for findings, recommendations and patient disposition/instructions.     [name] K. Aundria, M.D. Gastroenterology 02/20/2024  8:16 AM

## 2024-02-20 NOTE — Anesthesia Postprocedure Evaluation (Signed)
 Anesthesia Post Note  Patient: TANIYAH BALLOW  Procedure(s) Performed: COLONOSCOPY EGD (ESOPHAGOGASTRODUODENOSCOPY) DILATION, ESOPHAGUS, USING MALONEY DILATOR  Patient location during evaluation: Endoscopy Anesthesia Type: General Level of consciousness: awake and alert Pain management: pain level controlled Vital Signs Assessment: post-procedure vital signs reviewed and stable Respiratory status: spontaneous breathing, nonlabored ventilation, respiratory function stable and patient connected to nasal cannula oxygen Cardiovascular status: blood pressure returned to baseline and stable Postop Assessment: no apparent nausea or vomiting Anesthetic complications: no   No notable events documented.   Last Vitals:  Vitals:   02/20/24 0909 02/20/24 0921  BP: 136/71 (!) 144/82  Pulse: 79 71  Resp: 15 16  Temp:    SpO2: 100% 100%    Last Pain:  Vitals:   02/20/24 0903  TempSrc:   PainSc: 0-No pain                 [facility] [place]

## 2024-02-20 NOTE — Op Note (Addendum)
 [facility] Gastroenterology Patient Name: Cynthia Sims Procedure Date: 02/20/2024 7:35 AM MRN: [id number] Account #: [id number] Date of Birth: [birth year] Admit Type: Outpatient Age: 73 Room: [facility] ENDO ROOM 2 Gender: Female Note Status: Supervisor Override Instrument Name: [facility] GI Scope [id number] Procedure:             Upper GI endoscopy Indications:           Dysphagia Providers:             Kevante Lunt K. Adonys Wildes MD, MD Medicines:             Propofol  per Anesthesia Complications:         No immediate complications. Estimated blood loss: None. Procedure:             Pre-Anesthesia Assessment:                        - The risks and benefits of the procedure and the                         sedation options and risks were discussed with the                         patient. All questions were answered and informed                         consent was obtained.                        - Patient identification and proposed procedure were                         verified prior to the procedure by the nurse. The                         procedure was verified in the procedure room.                        - ASA Grade Assessment: III - A patient with severe                         systemic disease.                        - After reviewing the risks and benefits, the patient                         was deemed in satisfactory condition to undergo the                         procedure.                        After obtaining informed consent, the endoscope was                         passed under direct vision. Throughout the procedure,                         the patient's blood pressure, pulse, and oxygen  saturations were monitored continuously. The Endoscope                         was introduced through the mouth, and advanced to the                         third part of duodenum. The upper GI endoscopy was                         accomplished without  difficulty. The patient tolerated                         the procedure well. Findings:      One benign-appearing, intrinsic mild stenosis was found in the distal       esophagus. This stenosis measured 1.5 cm (inner diameter) x less than       one cm (in length). The stenosis was traversed. The scope was withdrawn.       Dilation was performed with a Maloney dilator with mild resistance at 54       Fr. Estimated blood loss: none.      The exam of the esophagus was otherwise normal.      A 1 cm hiatal hernia was present.      The exam of the stomach was otherwise normal.      The examined duodenum was normal. Impression:            - Benign-appearing esophageal stenosis. Dilated.                        - 1 cm hiatal hernia.                        - Normal examined duodenum.                        - No specimens collected. Recommendation:        - Monitor results to esophageal dilation                        - Proceed with colonoscopy Procedure Code(s):     --- Professional ---                        [phone], Esophagogastroduodenoscopy, flexible,                         transoral; diagnostic, including collection of                         specimen(s) by brushing or washing, when performed                         (separate procedure)                        43450, Dilation of esophagus, by unguided sound or                         bougie, single or multiple passes Diagnosis Code(s):     --- Professional ---  K21.9, Gastro-esophageal reflux disease without                         esophagitis                        R13.10, Dysphagia, unspecified                        K44.9, Diaphragmatic hernia without obstruction or                         gangrene                        K22.2, Esophageal obstruction CPT copyright 2022 American Medical Association. All rights reserved. The codes documented in this report are preliminary and upon coder review may  be revised to meet  current compliance requirements. Ladell MARLA Boss MD, MD 02/20/2024 8:36:36 AM This report has been signed electronically. Number of Addenda: 0 Note Initiated On: 02/20/2024 7:35 AM Estimated Blood Loss:  Estimated blood loss: none.      [facility]

## 2024-02-20 NOTE — Op Note (Signed)
 [facility]astroenterology Patient Name: Cynthia Sims Procedure Date: 02/20/2024 7:34 AM MRN: [id number] Account #: [id number] Date of Birth: 10-09-1950 Admit Type: Outpatient Age: 73 Room: [facility] ENDO ROOM 2 Gender: Female Note Status: Finalized Instrument Name: Peds Colonoscope [id number] Procedure:             Colonoscopy Indications:           High risk colon cancer surveillance: Personal history                         of non-advanced adenoma Providers:             Armani Brar K. Wetona Viramontes MD, MD Medicines:             Propofol  per Anesthesia Complications:         No immediate complications. Estimated blood loss: None. Procedure:             Pre-Anesthesia Assessment:                        - The risks and benefits of the procedure and the                         sedation options and risks were discussed with the                         patient. All questions were answered and informed                         consent was obtained.                        - Patient identification and proposed procedure were                         verified prior to the procedure by the nurse. The                         procedure was verified in the procedure room.                        - ASA Grade Assessment: III - A patient with severe                         systemic disease.                        - After reviewing the risks and benefits, the patient                         was deemed in satisfactory condition to undergo the                         procedure.                        After obtaining informed consent, the colonoscope was                         passed under direct vision. Throughout the procedure,  the patient's blood pressure, pulse, and oxygen                         saturations were monitored continuously. The                         Colonoscope was introduced through the anus and                         advanced to the the cecum,  identified by appendiceal                         orifice and ileocecal valve. The colonoscopy was                         performed without difficulty. The patient tolerated                         the procedure well. The quality of the bowel                         preparation was adequate. The ileocecal valve,                         appendiceal orifice, and rectum were photographed. Findings:      The perianal examination was normal.      The digital rectal exam findings include decreased sphincter tone.       Pertinent negatives include no palpable rectal lesions.      Many small-mouthed diverticula were found in the sigmoid colon.      The exam was otherwise without abnormality on direct and retroflexion       views. Impression:            - Decreased sphincter tone found on digital rectal                         exam.                        - Diverticulosis in the sigmoid colon.                        - The examination was otherwise normal on direct and                         retroflexion views.                        - No specimens collected. Recommendation:        - Monitor results to esophageal dilation                        - Patient has a contact number available for                         emergencies. The signs and symptoms of potential                         delayed complications were discussed with the patient.  Return to normal activities tomorrow. Written                         discharge instructions were provided to the patient.                        - Resume previous diet.                        - Continue present medications.                        - You do NOT require further colon cancer screening                         measures (Annual stool testing (i.e. hemoccult, FIT,                         cologuard), sigmoidoscopy, colonoscopy or CT                         colonography). You should share this recommendation                          with your Primary Care provider.                        - Follow up with Romero Antigua, PA-C at [facility]. [phone].                        - Telephone GI office to schedule appointment in 2                         months.                        - The findings and recommendations were discussed with                         the patient. Procedure Code(s):     --- Professional ---                        H9894, Colorectal cancer screening; colonoscopy on                         individual at high risk Diagnosis Code(s):     --- Professional ---                        K57.30, Diverticulosis of large intestine without                         perforation or abscess without bleeding                        K62.89, Other specified diseases of anus and rectum  Z86.010, Personal history of colonic polyps CPT copyright 2022 American Medical Association. All rights reserved. The codes documented in this report are preliminary and upon coder review may  be revised to meet current compliance requirements. Ladell MARLA Boss MD, MD 02/20/2024 8:59:26 AM This report has been signed electronically. Number of Addenda: 0 Note Initiated On: 02/20/2024 7:34 AM Scope Withdrawal Time: 0 hours 6 minutes 39 seconds  Total Procedure Duration: 0 hours 13 minutes 5 seconds  Estimated Blood Loss:  Estimated blood loss: none. Estimated blood loss: none.      [facility]

## 2024-02-20 NOTE — Transfer of Care (Signed)
 Immediate Anesthesia Transfer of Care Note  [name]  Procedure(s) Performed: COLONOSCOPY EGD (ESOPHAGOGASTRODUODENOSCOPY) DILATION, ESOPHAGUS, USING MALONEY DILATOR  Patient Location: PACU  Anesthesia Type:MAC  Level of Consciousness: awake and alert   Airway & Oxygen Therapy: Patient Spontanous Breathing and Patient connected to nasal cannula oxygen  Post-op Assessment: Report given to RN and Post -op Vital signs reviewed and stable  Post vital signs: Reviewed and stable  Last Vitals:  Vitals Value Taken Time  BP    Temp    Pulse 87 02/20/24 08:56  Resp 16 02/20/24 08:56  SpO2 100 % 02/20/24 08:56  Vitals shown include unfiled device data.  Last Pain:  Vitals:   02/20/24 0735  TempSrc: Temporal  PainSc: 0-No pain         Complications: No notable events documented.

## 2024-02-20 NOTE — Anesthesia Preprocedure Evaluation (Signed)
 Anesthesia Evaluation  Patient identified by MRN, date of birth, ID band Patient awake    Reviewed: Allergy & Precautions, H&P , NPO status , Patient's Chart, lab work & pertinent test results, reviewed documented beta blocker date and time   Airway Mallampati: II   Neck ROM: full    Dental  (+) Poor Dentition   Pulmonary neg pulmonary ROS, sleep apnea    Pulmonary exam normal        Cardiovascular Exercise Tolerance: Poor hypertension, On Medications + CAD and + Peripheral Vascular Disease  Normal cardiovascular exam Rhythm:regular Rate:Normal     Neuro/Psych  PSYCHIATRIC DISORDERS Anxiety      Neuromuscular disease negative neurological ROS  negative psych ROS   GI/Hepatic negative GI ROS, Neg liver ROS,GERD  Medicated,,  Endo/Other  negative endocrine ROSdiabetes    Renal/GU Renal disease     Musculoskeletal   Abdominal   Peds  Hematology negative hematology ROS (+) Blood dyscrasia, anemia   Anesthesia Other Findings Past Medical History: No date: ALT (SGPT) level raised No date: Anemia No date: Anxiety No date: Arthritis No date: Diabetes mellitus without complication (HCC) No date: Elevated cholesterol No date: GERD (gastroesophageal reflux disease) No date: Hemorrhoids No date: Hypertension No date: Kidney disease Past Surgical History: 1983: CESAREAN SECTION No date: CESAREAN SECTION No date: CHOLECYSTECTOMY 10/29/2015: COLONOSCOPY WITH PROPOFOL ; N/A     Comment:  Procedure: COLONOSCOPY WITH PROPOFOL ;  Surgeon: Lamar ONEIDA Holmes, MD;  Location: [facility] ENDOSCOPY;  Service:               Endoscopy;  Laterality: N/A; 2001: DILATION AND CURETTAGE OF UTERUS 10/29/2015: ESOPHAGOGASTRODUODENOSCOPY (EGD) WITH PROPOFOL ; N/A     Comment:  Procedure: ESOPHAGOGASTRODUODENOSCOPY (EGD) WITH               PROPOFOL ;  Surgeon: Lamar ONEIDA Holmes, MD;  Location: [facility]              ENDOSCOPY;  Service:  Endoscopy;  Laterality: N/A; BMI    Body Mass Index:  35.33 kg/m     Reproductive/Obstetrics negative OB ROS                              Anesthesia Physical Anesthesia Plan  ASA: 2  Anesthesia Plan: General   Post-op Pain Management: Minimal or no pain anticipated   Induction: Intravenous  PONV Risk Score and Plan: 2 and Propofol  infusion and TIVA  Airway Management Planned: Nasal Cannula  Additional Equipment: None  Intra-op Plan:   Post-operative Plan:   Informed Consent: I have reviewed the patients History and Physical, chart, labs and discussed the procedure including the risks, benefits and alternatives for the proposed anesthesia with the patient or authorized representative who has indicated his/her understanding and acceptance.     Dental advisory given  Plan Discussed with: CRNA and Surgeon  Anesthesia Plan Comments: (Discussed risks of anesthesia with patient, including possibility of difficulty with spontaneous ventilation under anesthesia necessitating airway intervention, PONV, and rare risks such as cardiac or respiratory or neurological events, and allergic reactions. Discussed the role of CRNA in patient's perioperative care. Patient understands.)        Anesthesia Quick Evaluation

## 2024-02-22 ENCOUNTER — Encounter: Payer: Self-pay | Admitting: Internal Medicine

## 2024-02-26 ENCOUNTER — Ambulatory Visit: Admitting: Student in an Organized Health Care Education/Training Program

## 2024-02-28 DIAGNOSIS — E1142 Type 2 diabetes mellitus with diabetic polyneuropathy: Secondary | ICD-10-CM | POA: Diagnosis not present

## 2024-03-05 DIAGNOSIS — E1169 Type 2 diabetes mellitus with other specified complication: Secondary | ICD-10-CM | POA: Diagnosis not present

## 2024-03-05 DIAGNOSIS — E1159 Type 2 diabetes mellitus with other circulatory complications: Secondary | ICD-10-CM | POA: Diagnosis not present

## 2024-03-05 DIAGNOSIS — E1142 Type 2 diabetes mellitus with diabetic polyneuropathy: Secondary | ICD-10-CM | POA: Diagnosis not present

## 2024-03-05 DIAGNOSIS — I152 Hypertension secondary to endocrine disorders: Secondary | ICD-10-CM | POA: Diagnosis not present

## 2024-03-05 DIAGNOSIS — E785 Hyperlipidemia, unspecified: Secondary | ICD-10-CM | POA: Diagnosis not present

## 2024-03-05 DIAGNOSIS — Z794 Long term (current) use of insulin: Secondary | ICD-10-CM | POA: Diagnosis not present

## 2024-03-14 ENCOUNTER — Other Ambulatory Visit (INDEPENDENT_AMBULATORY_CARE_PROVIDER_SITE_OTHER): Payer: Self-pay | Admitting: Nurse Practitioner

## 2024-03-17 ENCOUNTER — Other Ambulatory Visit: Payer: Self-pay | Admitting: Family Medicine

## 2024-03-17 DIAGNOSIS — E1149 Type 2 diabetes mellitus with other diabetic neurological complication: Secondary | ICD-10-CM

## 2024-03-29 DIAGNOSIS — E1142 Type 2 diabetes mellitus with diabetic polyneuropathy: Secondary | ICD-10-CM | POA: Diagnosis not present

## 2024-04-07 ENCOUNTER — Ambulatory Visit: Admitting: Podiatry

## 2024-04-07 ENCOUNTER — Encounter: Payer: Self-pay | Admitting: Podiatry

## 2024-04-07 DIAGNOSIS — L84 Corns and callosities: Secondary | ICD-10-CM

## 2024-04-07 DIAGNOSIS — I739 Peripheral vascular disease, unspecified: Secondary | ICD-10-CM | POA: Diagnosis not present

## 2024-04-07 DIAGNOSIS — B351 Tinea unguium: Secondary | ICD-10-CM

## 2024-04-07 DIAGNOSIS — E0843 Diabetes mellitus due to underlying condition with diabetic autonomic (poly)neuropathy: Secondary | ICD-10-CM | POA: Diagnosis not present

## 2024-04-07 DIAGNOSIS — M79675 Pain in left toe(s): Secondary | ICD-10-CM | POA: Diagnosis not present

## 2024-04-07 DIAGNOSIS — M79674 Pain in right toe(s): Secondary | ICD-10-CM

## 2024-04-07 NOTE — Progress Notes (Unsigned)
  Subjective:  Patient ID: Cynthia Sims, female    DOB: Dec 08, 1950,  MRN: [id number]  Cynthia Sims presents to clinic today for at risk footcare. Patient has h/o diabetes, neuropathy and PAD and is seen for  and painful elongated mycotic toenails 1-5 bilaterally which are tender when wearing enclosed shoe gear. Pain is relieved with periodic professional debridement. Patient states she has two stents placed in her RLE. Chief Complaint  Patient presents with   Toe Pain    Diabetic foot care. Last office visit with Dr. Sharma was in April. A1c is 6.3   New problem(s): None.   PCP is Simmons-Robinson, Makiera, MD.  No Known Allergies  Review of Systems: Negative except as noted in the HPI.  Objective: No changes noted in today's physical examination. There were no vitals filed for this visit. Cynthia Sims is a pleasant 73 y.o. female WD, WN in NAD. AAO x 3.  Vascular Examination: CFT <3 seconds b/l. DP/PT pulses faintly palpable b/l. Digital hair diminished.  Skin temperature gradient warm to warm b/l. No pain with calf compression. No ischemia or gangrene. No cyanosis or clubbing noted b/l. Trace edema noted BLE.   Neurological Examination: Pt has subjective symptoms of neuropathy. Protective sensation diminished with 10g monofilament b/l.  Dermatological Examination: Pedal skin warm and supple b/l.   No open wounds. No interdigital macerations.  Toenails 1-5 b/l thick, discolored, elongated with subungual debris and pain on dorsal palpation.    Hyperkeratoses noted plantar IPJ of left great toe, submet head 4 left foot, submet head 5 right foot.   Musculoskeletal Examination: Muscle strength 5/5 to all lower extremity muscle groups bilaterally. HAV with bunion deformity noted b/l LE. Hammertoe(s) 2-5 b/l.Cynthia Sims No pain, crepitus or joint limitation noted with ROM b/l LE.  Patient ambulates independently without assistive aids.  Radiographs:  None  Assessment/Plan: 1. Pain due to onychomycosis of toenails of both feet   2. Callus   3. PAD (peripheral artery disease)   4. Diabetes mellitus due to underlying condition with diabetic autonomic neuropathy, unspecified whether long term insulin  use [facility])     Consent given for treatment. Patient examined. All patient's and/or POA's questions/concerns addressed on today's visit. Mycotic toenails 1-5 b/l  debrided in length and girth without incident. Callus(es) left great toe, submet head 4 left foot, and submet head 5 right foot pared with sharp debridement without incident. Continue foot and shoe inspections daily. Monitor blood glucose per PCP/Endocrinologist's recommendations.Continue soft, supportive shoe gear daily. Report any pedal injuries to medical professional. Call office if there are any quesitons/concerns.  Return in about 3 months (around 07/08/2024).  Cynthia Sims, DPM      [place] LOCATION: [address], [place] [address]                   Office [phone]   [facility] LOCATION: [address] [place], [place] [address] Office [phone]

## 2024-04-09 NOTE — Progress Notes (Signed)
 Cynthia Sims                                          MRN: [id number]   04/09/2024   The [facility] Quality Team Specialist reviewed this patient medical record for the purposes of chart review for care gap closure. The following were reviewed: abstraction for care gap closure-controlling blood pressure.    [facility] Quality Team

## 2024-04-11 ENCOUNTER — Other Ambulatory Visit: Payer: Self-pay | Admitting: Internal Medicine

## 2024-04-14 ENCOUNTER — Other Ambulatory Visit (INDEPENDENT_AMBULATORY_CARE_PROVIDER_SITE_OTHER): Payer: Self-pay | Admitting: Nurse Practitioner

## 2024-04-15 ENCOUNTER — Ambulatory Visit
Admission: RE | Admit: 2024-04-15 | Discharge: 2024-04-15 | Disposition: A | Discharge status: Home / Self Care | Source: Ambulatory Visit | Attending: Student in an Organized Health Care Education/Training Program | Admitting: Student in an Organized Health Care Education/Training Program

## 2024-04-15 ENCOUNTER — Ambulatory Visit
Admission: RE | Admit: 2024-04-15 | Discharge: 2024-04-15 | Disposition: A | Source: Ambulatory Visit | Attending: Student in an Organized Health Care Education/Training Program | Admitting: Student in an Organized Health Care Education/Training Program

## 2024-04-15 ENCOUNTER — Ambulatory Visit: Admitting: Student in an Organized Health Care Education/Training Program

## 2024-04-15 ENCOUNTER — Encounter: Payer: Self-pay | Admitting: Student in an Organized Health Care Education/Training Program

## 2024-04-15 VITALS — BP 138/73 | HR 76 | Temp 97.3°F | Resp 16 | Ht 61.0 in | Wt 136.4 lb

## 2024-04-15 DIAGNOSIS — G8929 Other chronic pain: Secondary | ICD-10-CM

## 2024-04-15 DIAGNOSIS — G894 Chronic pain syndrome: Secondary | ICD-10-CM | POA: Insufficient documentation

## 2024-04-15 DIAGNOSIS — M5416 Radiculopathy, lumbar region: Secondary | ICD-10-CM

## 2024-04-15 DIAGNOSIS — E1149 Type 2 diabetes mellitus with other diabetic neurological complication: Secondary | ICD-10-CM | POA: Insufficient documentation

## 2024-04-15 DIAGNOSIS — M47816 Spondylosis without myelopathy or radiculopathy, lumbar region: Secondary | ICD-10-CM | POA: Diagnosis not present

## 2024-04-15 DIAGNOSIS — Z794 Long term (current) use of insulin: Secondary | ICD-10-CM | POA: Diagnosis not present

## 2024-04-15 MED ORDER — GABAPENTIN 300 MG PO CAPS
600.0000 mg | ORAL_CAPSULE | Freq: Three times a day (TID) | ORAL | 0 refills | Status: AC
Start: 2024-04-15 — End: ?

## 2024-04-15 NOTE — Progress Notes (Signed)
 Safety precautions to be maintained throughout the outpatient stay will include: orient to surroundings, keep bed in low position, maintain call bell within reach at all times, provide assistance with transfer out of bed and ambulation.

## 2024-04-15 NOTE — Progress Notes (Signed)
 PROVIDER NOTE: Interpretation of information contained herein should be left to medically-trained personnel. Specific patient instructions are provided elsewhere under Patient Instructions section of medical record. This document was created in part using AI and STT-dictation technology, any transcriptional errors that may result from this process are unintentional.  [name]  Service: E/M Encounter  Provider: Wallie Sherry, MD  DOB: [birth year]  Delivery: Face-to-face  Specialty: Interventional Pain Management  MRN: [id number]  Setting: Ambulatory outpatient facility  Specialty designation: 09  Type: New Patient  Location: Outpatient office facility  PCP: Sharma Coyer, MD  DOS: 04/15/2024    Referring Prov.: Schnier, Cordella MATSU, MD   Primary Reason(s) for Visit: Encounter for initial evaluation of one or more chronic problems (new to examiner) potentially causing chronic pain, and posing a threat to normal musculoskeletal function. (Level of risk: High) CC: Leg Pain (Right 3 stents present by vein and vascular within past year )  HPI  Cynthia Sims is a 73 y.o. year old, female patient, who comes for the first time to our practice referred by Jama, Cordella MATSU, MD for our initial evaluation of her chronic pain. She has Atony of bladder; Chronic kidney disease (CKD), stage III (moderate) (HCC); Hyperlipidemia associated with type 2 diabetes mellitus (HCC); Apnea, sleep; Leg varices; Anxiety; Esophageal dysphagia; Acid reflux; H/O adenomatous polyp of colon; Mild cognitive disorder; H. pylori infection; Chronic diarrhea; Benign head tremor; B12 deficiency; Diabetic neuropathy (HCC); Essential hypertension; Claudication of both lower extremities; Moderate nonproliferative diabetic retinopathy (HCC); Hypertensive retinopathy of both eyes; PAD (peripheral artery disease); Hernia of abdominal cavity; Atherosclerosis of native arteries of extremity with intermittent claudication;  Hypoglycemia due to type 2 diabetes mellitus (HCC); Acute renal failure superimposed on stage 3b chronic kidney disease (HCC); Anemia; Thrombocytopenia; Metabolic acidosis; Acute cystitis; Hypotension; Coronary artery disease; Type 2 diabetes mellitus with hyperlipidemia (HCC); Obesity, class 1; Abnormal uterine bleeding; and Leg pain on their problem list. Today she comes in for evaluation of her Leg Pain (Right 3 stents present by vein and vascular within past year )  Pain Assessment: Location: Right Leg Radiating: up and down the posterior aspect of righ leg Onset: More than a month ago Duration: Chronic pain Quality: Discomfort, Sharp, Constant, Throbbing Severity: 8 /10 (subjective, self-reported pain score)  Effect on ADL: wakes her up at night Timing: Constant Modifying factors: tylenol  helps a little bit BP: 138/73  HR: 76  Onset and Duration: more than 1 month ago Cause of pain: Unknown Severity: Getting better and NAS-11 now: 5/10 Timing: Morning, Afternoon, and Night Aggravating Factors: Surgery made it worse Alleviating Factors: Lying down and Medications Associated Problems: Pain that wakes patient up Quality of Pain: Aching, Sharp, Throbbing, and Uncomfortable Previous Examinations or Tests: none noted Previous Treatments: none noted  Ms. Haeberle is being evaluated for possible interventional pain management therapies for the treatment of her chronic pain.   Discussed the use of AI scribe software for clinical note transcription with the patient, who gave verbal consent to proceed.  History of Present Illness   Cynthia Sims is a 73 year old female with type 2 diabetes who presents with leg pain following stent placement. She was referred by Vascular Surgery Dr Jama for right persistent leg pain.  Her leg pain began about one to two weeks after receiving two stents earlier this year, although she cannot recall the exact month of the procedure. The pain is located  in her legs, particularly in the calf and heel, and  sometimes radiates down from the buttock area. She describes the pain as persistent and states, 'all I know, it hurts.'  She has a history of stent placement in her legs, with one stent placed last year and two additional stents placed this year due to poor blood flow. After the recent stent placements, she was informed that her blood flow was good, yet the pain persisted.  She is currently on blood thinners as noted below and gabapentin  300 mg three times a day, which does not alleviate her pain. She denies any side effects from gabapentin .  She experiences pain that extends to her feet, particularly in the calf and heel areas.       Meds   Current Outpatient Medications:    acetaminophen  (TYLENOL ) 500 MG tablet, Take 500 mg by mouth every 6 (six) hours as needed. , Disp: , Rfl:    aspirin  EC 81 MG tablet, Take 81 mg by mouth daily. , Disp: , Rfl:    carvedilol  (COREG ) 25 MG tablet, Take 1 tablet by mouth twice daily, Disp: 180 tablet, Rfl: 1   cholestyramine  (QUESTRAN ) 4 g packet, DISSOLVE AND TAKE ONE PACKET BY MOUTH THREE TIMES DAILY, Disp: 270 each, Rfl: 0   clopidogrel  (PLAVIX ) 75 MG tablet, Take 1 tablet by mouth once daily, Disp: 90 tablet, Rfl: 3   cyanocobalamin  1000 MCG tablet, Take 1,000 mcg by mouth daily., Disp: , Rfl:    DULoxetine  (CYMBALTA ) 60 MG capsule, Take 1 capsule by mouth once daily, Disp: 90 capsule, Rfl: 0   GVOKE HYPOPEN 2-PACK 1 MG/0.2ML SOAJ, Inject 1 mg as directed as directed.  INJECT 1 PEN IN CASE OF SEVERE HYPOGLYCEMIA, Disp: , Rfl:    Insulin  Glargine (BASAGLAR  KWIKPEN) 100 UNIT/ML, Inject 24 Units into the skin daily. INJECT 24 UNITS SUBCUTANEOUSLY ONCE DAILY, Disp: 15 mL, Rfl: 0   insulin  glargine (LANTUS ) 100 UNIT/ML Solostar Pen, Inject 24 Units into the skin daily., Disp: 15 mL, Rfl: 2   isosorbide  mononitrate (IMDUR ) 30 MG 24 hr tablet, Take 1 tablet by mouth once daily, Disp: 90 tablet, Rfl: 0    lovastatin  (MEVACOR ) 40 MG tablet, Take 1 tablet by mouth once daily, Disp: 90 tablet, Rfl: 2   metFORMIN  (GLUCOPHAGE ) 500 MG tablet, TAKE 1 TABLET BY MOUTH TWICE DAILY WITH A MEAL, Disp: 180 tablet, Rfl: 3   nitroGLYCERIN  (NITROSTAT ) 0.4 MG SL tablet, Place 1 tablet (0.4 mg total) under the tongue every 5 (five) minutes as needed for chest pain., Disp: 25 tablet, Rfl: 3   ondansetron  (ZOFRAN -ODT) 4 MG disintegrating tablet, Take 1 tablet (4 mg total) by mouth every 8 (eight) hours as needed for nausea or vomiting., Disp: 20 tablet, Rfl: 0   pantoprazole  (PROTONIX ) 40 MG tablet, Take 1 tablet by mouth once daily, Disp: 30 tablet, Rfl: 0   Semaglutide,0.25 or 0.5MG /DOS, 2 MG/3ML SOPN, Inject into the skin., Disp: , Rfl:    trimethoprim  (TRIMPEX ) 100 MG tablet, Take 1 tablet (100 mg total) by mouth daily., Disp: 30 tablet, Rfl: 11   gabapentin  (NEURONTIN ) 300 MG capsule, Take 2 capsules (600 mg total) by mouth 3 (three) times daily., Disp: 180 capsule, Rfl: 0  Imaging Review  DG Lumbar Spine Complete  Narrative CLINICAL DATA:  Low back pain and right leg radiculopathy.  EXAM: LUMBAR SPINE - COMPLETE 4+ VIEW  COMPARISON:  None.  FINDINGS: Five non-rib-bearing lumbar vertebral bodies. No fracture or listhesis. Facet articulations are normal. The intervertebral disc spaces and vertebral body heights are  preserved. There is calcification at the aortic bifurcation.  IMPRESSION: No fracture, listhesis or advanced degenerative disease of the lumbar spine. Vertebral body heights and disc spaces are normal for age.   Electronically Signed By: Franky Stanford M.D. On: 01/09/2017 15:56    Complexity Note: Imaging results reviewed.                         ROS  Cardiovascular: High blood pressure and Blood thinners:  Antiplatelet Pulmonary or Respiratory: No reported pulmonary signs or symptoms such as wheezing and difficulty taking a deep full breath (Asthma), difficulty blowing air out  (Emphysema), coughing up mucus (Bronchitis), persistent dry cough, or temporary stoppage of breathing during sleep Neurological: No reported neurological signs or symptoms such as seizures, abnormal skin sensations, urinary and/or fecal incontinence, being born with an abnormal open spine and/or a tethered spinal cord Psychological-Psychiatric: Anxiousness Gastrointestinal: Reflux or heatburn and Alternating episodes iof diarrhea and constipation (IBS-Irritable bowe syndrome) Genitourinary: Kidney disease Hematological: Brusing easily Endocrine: High blood sugar requiring insulin  (IDDM) Rheumatologic: No reported rheumatological signs and symptoms such as fatigue, joint pain, tenderness, swelling, redness, heat, stiffness, decreased range of motion, with or without associated rash Musculoskeletal: Negative for myasthenia gravis, muscular dystrophy, multiple sclerosis or malignant hyperthermia Work History: Retired  Allergies  Ms. Studzinski has no known allergies.  Laboratory Chemistry Profile   Renal Lab Results  Component Value Date   BUN 21 10/23/2023   CREATININE 1.55 (H) 10/23/2023   BCR 13 10/08/2023   GFRAA 41 (L) 10/06/2019   GFRNONAA 35 (L) 10/23/2023   SPECGRAV >1.030 (H) 09/03/2023   PHUR 5.5 09/03/2023   PROTEINUR Trace (A) 09/03/2023     Electrolytes Lab Results  Component Value Date   NA 142 10/08/2023   K 4.3 10/08/2023   CL 110 (H) 10/08/2023   CALCIUM 7.6 (L) 10/08/2023   MG 1.4 (L) 03/01/2012     Hepatic Lab Results  Component Value Date   AST 18 05/18/2023   ALT 12 05/18/2023   ALBUMIN 2.5 (L) 05/18/2023   ALKPHOS 54 05/18/2023   AMYLASE 154 (H) 02/26/2019   LIPASE 74 (H) 05/17/2023     ID No results found for: LYMEIGGIGMAB, HIV, SARSCOV2NAA, STAPHAUREUS, MRSAPCR, HCVAB, PREGTESTUR, RMSFIGG, QFVRPH1IGG, QFVRPH2IGG   Bone No results found for: VD25OH, CI874NY7UNU, CI6874NY7, CI7874NY7, 25OHVITD1, 25OHVITD2,  25OHVITD3, TESTOFREE, TESTOSTERONE   Endocrine Lab Results  Component Value Date   GLUCOSE 123 (H) 10/08/2023   GLUCOSEU Negative 09/03/2023   HGBA1C 6.2 (H) 10/08/2023   TSH 2.080 09/22/2021     Neuropathy Lab Results  Component Value Date   VITAMINB12 680 10/08/2023   HGBA1C 6.2 (H) 10/08/2023     CNS No results found for: COLORCSF, APPEARCSF, RBCCOUNTCSF, WBCCSF, POLYSCSF, LYMPHSCSF, EOSCSF, PROTEINCSF, GLUCCSF, JCVIRUS, CSFOLI, IGGCSF, LABACHR, ACETBL   Inflammation (CRP: Acute  ESR: Chronic) Lab Results  Component Value Date   LATICACIDVEN 0.9 05/18/2023     Rheumatology No results found for: RF, ANA, LABURIC, URICUR, LYMEIGGIGMAB, LYMEABIGMQN, HLAB27   Coagulation Lab Results  Component Value Date   PLT 199 05/28/2023   DDIMER 0.76 (H) 04/12/2015     Cardiovascular Lab Results  Component Value Date   BNP 21.9 06/09/2022   CKTOTAL 63 05/28/2023   TROPONINI < 0.02 03/01/2012   HGB 10.7 (L) 05/28/2023   HCT 32.7 (L) 05/28/2023     Screening No results found for: SARSCOV2NAA, COVIDSOURCE, STAPHAUREUS, MRSAPCR, HCVAB, HIV, PREGTESTUR   Cancer  No results found for: CEA, CA125, LABCA2   Allergens No results found for: ALMOND, APPLE, ASPARAGUS, AVOCADO, BANANA, BARLEY, BASIL, BAYLEAF, GREENBEAN, LIMABEAN, WHITEBEAN, BEEFIGE, REDBEET, BLUEBERRY, BROCCOLI, CABBAGE, MELON, CARROT, CASEIN, CASHEWNUT, CAULIFLOWER, CELERY     Note: Lab results reviewed.  PFSH  Drug: Ms. Godino  reports no history of drug use. Alcohol:  reports that she does not currently use alcohol. Tobacco:  reports that she has never smoked. She has never been exposed to tobacco smoke. She has never used smokeless tobacco. Medical:  has a past medical history of ALT (SGPT) level raised, Anemia, Anxiety, Arthritis, Depression, Diabetes mellitus without complication (HCC), Diarrhea,  functional, Elevated cholesterol, GERD (gastroesophageal reflux disease), H. pylori infection, H/O adenomatous polyp of colon, Hemorrhoids, Hypertension, Kidney disease, and Sleep apnea. Family: family history includes Alzheimer's disease in her mother; Breast cancer (age of onset: 46) in her mother; COPD in her maternal grandmother and mother; Diabetes in her father, paternal grandfather, sister, sister, and sister; Heart attack in her father; Hypertension in her father; Lupus in her father; Osteoporosis in her mother.  Past Surgical History:  Procedure Laterality Date   abdominal blockage     CESAREAN SECTION  1983   CESAREAN SECTION     CHOLECYSTECTOMY     COLONOSCOPY N/A 02/20/2024   Procedure: COLONOSCOPY;  Surgeon: Toledo, Ladell POUR, MD;  Location: [facility] ENDOSCOPY;  Service: Gastroenterology;  Laterality: N/A;  IDDM/Plavix    COLONOSCOPY WITH PROPOFOL  N/A 10/29/2015   Procedure: COLONOSCOPY WITH PROPOFOL ;  Surgeon: Lamar ONEIDA Holmes, MD;  Location: [facility] ENDOSCOPY;  Service: Endoscopy;  Laterality: N/A;   COLONOSCOPY WITH PROPOFOL  N/A 11/07/2017   Procedure: COLONOSCOPY WITH PROPOFOL ;  Surgeon: Toledo, Ladell POUR, MD;  Location: [facility] ENDOSCOPY;  Service: Gastroenterology;  Laterality: N/A;   DILATION AND CURETTAGE OF UTERUS  2001   ESOPHAGOGASTRODUODENOSCOPY N/A 02/20/2024   Procedure: EGD (ESOPHAGOGASTRODUODENOSCOPY);  Surgeon: Toledo, Ladell POUR, MD;  Location: [facility] ENDOSCOPY;  Service: Gastroenterology;  Laterality: N/A;   ESOPHAGOGASTRODUODENOSCOPY (EGD) WITH PROPOFOL  N/A 10/29/2015   Procedure: ESOPHAGOGASTRODUODENOSCOPY (EGD) WITH PROPOFOL ;  Surgeon: Lamar ONEIDA Holmes, MD;  Location: [facility] ENDOSCOPY;  Service: Endoscopy;  Laterality: N/A;   LOWER EXTREMITY ANGIOGRAPHY Right 10/24/2022   Procedure: Lower Extremity Angiography;  Surgeon: Jama Cordella MATSU, MD;  Location: [facility] INVASIVE CV LAB;  Service: Cardiovascular;  Laterality: Right;   LOWER EXTREMITY ANGIOGRAPHY Left 10/31/2022   Procedure:  Lower Extremity Angiography;  Surgeon: Jama Cordella MATSU, MD;  Location: [facility] INVASIVE CV LAB;  Service: Cardiovascular;  Laterality: Left;   LOWER EXTREMITY ANGIOGRAPHY Right 10/23/2023   Procedure: Lower Extremity Angiography;  Surgeon: Jama Cordella MATSU, MD;  Location: [facility] INVASIVE CV LAB;  Service: Cardiovascular;  Laterality: Right;   LOWER EXTREMITY INTERVENTION Right 10/23/2023   Procedure: LOWER EXTREMITY INTERVENTION;  Surgeon: Jama Cordella MATSU, MD;  Location: [facility] INVASIVE CV LAB;  Service: Cardiovascular;  Laterality: Right;   MALONEY DILATION  02/20/2024   Procedure: DILATION, ESOPHAGUS, USING MALONEY DILATOR;  Surgeon: Aundria, Ladell POUR, MD;  Location: [facility] ENDOSCOPY;  Service: Gastroenterology;;   Active Ambulatory Problems    Diagnosis Date Noted   Atony of bladder 12/14/2005   Chronic kidney disease (CKD), stage III (moderate) (HCC) 01/13/2015   Hyperlipidemia associated with type 2 diabetes mellitus (HCC) 12/14/2005   Apnea, sleep 12/09/2008   Leg varices 06/09/2008   Anxiety 01/14/2015   Esophageal dysphagia 09/13/2015   Acid reflux 09/13/2015   H/O adenomatous polyp of colon 09/13/2015   Mild cognitive disorder 03/13/2019   H.  pylori infection 01/04/2016   Chronic diarrhea 03/13/2019   Benign head tremor 03/13/2019   B12 deficiency 03/13/2019   Diabetic neuropathy (HCC) 05/22/2022   Essential hypertension 06/09/2022   Claudication of both lower extremities 08/23/2022   Moderate nonproliferative diabetic retinopathy (HCC) 10/17/2022   Hypertensive retinopathy of both eyes 10/17/2022   PAD (peripheral artery disease) 12/25/2022   Hernia of abdominal cavity 12/25/2022   Atherosclerosis of native arteries of extremity with intermittent claudication 02/19/2023   Hypoglycemia due to type 2 diabetes mellitus (HCC) 02/26/2023   Acute renal failure superimposed on stage 3b chronic kidney disease (HCC) 02/26/2023   Anemia    Thrombocytopenia 02/26/2023   Metabolic  acidosis 02/27/2023   Acute cystitis 05/18/2023   Hypotension 05/18/2023   Coronary artery disease 05/18/2023   Type 2 diabetes mellitus with hyperlipidemia (HCC) 05/19/2023   Obesity, class 1 05/20/2023   Abnormal uterine bleeding 07/30/2023   Leg pain 01/19/2024   Resolved Ambulatory Problems    Diagnosis Date Noted   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   Cannot sleep 02/26/2009   Anxiety state 02/26/2009   Hypertension 01/14/2015   ALT (SGPT) level raised 09/13/2015   Gallstone pancreatitis 02/09/2019   Screening for colon cancer 08/23/2022   Hyperlipidemia associated with type 2 diabetes mellitus (HCC) 12/25/2022   UTI (urinary tract infection) 02/27/2023   Urinary retention 02/27/2023   Past Medical History:  Diagnosis Date   Arthritis    Depression    Diabetes mellitus without complication (HCC)    Diarrhea, functional    Elevated cholesterol    GERD (gastroesophageal reflux disease)    Hemorrhoids    Kidney disease    Sleep apnea    Constitutional Exam  General appearance: Well nourished, well developed, and well hydrated. In no apparent acute distress Vitals:   04/15/24 0859  BP: 138/73  Pulse: 76  Resp: 16  Temp: (!) 97.3 F (36.3 C)  TempSrc: Temporal  SpO2: 100%  Weight: 136 lb 6.4 oz (61.9 kg)  Height: 5' 1 (1.549 m)   BMI Assessment: Estimated body mass index is 25.77 kg/m as calculated from the following:   Height as of this encounter: 5' 1 (1.549 m).   Weight as of this encounter: 136 lb 6.4 oz (61.9 kg).  BMI interpretation table: BMI level Category Range association with higher incidence of chronic pain  <18 kg/m2 Underweight   18.5-24.9 kg/m2 Ideal body weight   25-29.9 kg/m2 Overweight Increased incidence by 20%  30-34.9 kg/m2 Obese (Class I) Increased incidence by 68%  35-39.9 kg/m2 Severe obesity (Class II) Increased incidence by 136%  >40 kg/m2 Extreme obesity (Class III) Increased incidence by 254%   Patient's current BMI Ideal Body  weight  Body mass index is 25.77 kg/m. Ideal body weight: 47.8 kg (105 lb 6.1 oz) Adjusted ideal body weight: 53.4 kg (117 lb 12.6 oz)   BMI Readings from Last 4 Encounters:  04/15/24 25.77 kg/m  02/20/24 26.34 kg/m  01/17/24 26.53 kg/m  01/03/24 26.36 kg/m   Wt Readings from Last 4 Encounters:  04/15/24 136 lb 6.4 oz (61.9 kg)  02/20/24 139 lb 6.4 oz (63.2 kg)  01/17/24 140 lb 6.4 oz (63.7 kg)  01/03/24 139 lb 8 oz (63.3 kg)    Psych/Mental status: Alert, oriented x 3 (person, place, & time)       Eyes: PERLA Respiratory: No evidence of acute respiratory distress  Lumbar Spine Area Exam  Skin & Axial Inspection: No masses, redness, or swelling Alignment: Symmetrical  Functional ROM: Pain restricted ROM       Stability: No instability detected Muscle Tone/Strength: Functionally intact. No obvious neuro-muscular anomalies detected. Sensory (Neurological): Dermatomal pain pattern Palpation: No palpable anomalies       +right SLR Gait & Posture Assessment  Ambulation: Unassisted Gait: Relatively normal for age and body habitus Posture: WNL  Lower Extremity Exam    Side: Right lower extremity  Side: Left lower extremity  Stability: No instability observed          Stability: No instability observed          Skin & Extremity Inspection: Skin color, temperature, and hair growth are WNL. No peripheral edema or cyanosis. No masses, redness, swelling, asymmetry, or associated skin lesions. No contractures.  Skin & Extremity Inspection: Skin color, temperature, and hair growth are WNL. No peripheral edema or cyanosis. No masses, redness, swelling, asymmetry, or associated skin lesions. No contractures.  Functional ROM: Pain restricted ROM for hip and knee joints          Functional ROM: Unrestricted ROM                  Muscle Tone/Strength: Functionally intact. No obvious neuro-muscular anomalies detected.  Muscle Tone/Strength: Functionally intact. No obvious neuro-muscular  anomalies detected.  Sensory (Neurological): Dermatomal pain pattern        Sensory (Neurological): Unimpaired        DTR: Patellar: deferred today Achilles: deferred today Plantar: deferred today  DTR: Patellar: deferred today Achilles: deferred today Plantar: deferred today  Palpation: No palpable anomalies  Palpation: No palpable anomalies    Assessment  Primary Diagnosis & Pertinent Problem List: The primary encounter diagnosis was Chronic radicular lumbar pain (right). Diagnoses of Lumbar radiculopathy, Chronic pain syndrome, and Other diabetic neurological complication associated with type 2 diabetes mellitus (HCC) were also pertinent to this visit.  Visit Diagnosis (New problems to examiner): 1. Chronic radicular lumbar pain (right)   2. Lumbar radiculopathy   3. Chronic pain syndrome   4. Other diabetic neurological complication associated with type 2 diabetes mellitus (HCC)    Plan of Care (Initial workup plan)  Note: Ms. Yeske was reminded that as per protocol, today's visit has been an evaluation only. We have not taken over the patient's controlled substance management.  Problem-specific plan: Assessment and Plan    Lumbar radiculopathy and chronic pain syndrome   Chronic pain affects her lower back, buttocks, and legs, worsening after stent placement, with pain radiating to the calf and heel. The current gabapentin  dose is not helpful.  Possible causes include nerve irritation from the stent or lumbar radiculopathy. Increase gabapentin  to 600 mg three times daily, titrated over three weeks. Order x-ray and MRI of the lower back. Refer to physical therapy for pain-relieving exercises. Schedule follow-up in four weeks to evaluate pain management and consider right lumbar epidural steroid  injection if no improvement.  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 Glycemic control and diabetic  complications were not discussed in this encounter.   Right PAD- stents in place, followed by vascular surgery. Recent ABI WNL       Imaging Orders         DG Lumbar Spine Complete W/Bend         MR LUMBAR SPINE WO CONTRAST    Referral Orders         Ambulatory referral to Physical Therapy     Pharmacotherapy (current):  Medications ordered:  Meds ordered this encounter  Medications   gabapentin  (NEURONTIN ) 300 MG capsule    Sig: Take 2 capsules (600 mg total) by mouth 3 (three) times daily.    Dispense:  180 capsule    Refill:  0   Medications administered during this visit: Robinette C. Losh had no medications administered during this visit.   Analgesic Pharmacotherapy:  Opioid Analgesics: For patients currently taking or requesting to take opioid analgesics, in accordance with [place]  Medical Board Guidelines, we will assess their risks and indications for the use of these substances. After completing our evaluation, we may take on pt for COT  Membrane stabilizer: Adequate regimen. Can consider Lyrica in future  Muscle relaxant: To be determined at a later time  NSAID: Medically contraindicated  Other analgesic(s): To be determined at a later time   Interventional management options: Ms. Levandoski was informed that there is no guarantee that she would be a candidate for interventional therapies. The decision will be based on the results of diagnostic studies, as well as Ms. Lichter's risk profile.  Procedure(s) under consideration:  Right lumbar ESI Also consider SCS for possible vascular claudication    Provider-requested follow-up: Return in about 4 weeks (around 05/13/2024) for Seema Patel,2nd pt visit.  Future Appointments  Date Time Provider Department Center  04/21/2024 11:30 AM [facility]  07/18/2024  9:00 AM Gaynel Delon CROME, DPM [facility]  07/21/2024  1:00 PM [facility] 3 [facility] None  07/21/2024  2:00 PM [facility] None   07/21/2024  2:45 PM Schnier, Cordella MATSU, MD [facility] None  09/01/2024  1:45 PM MacDiarmid, Glendia, MD [facility] None   I discussed the assessment and treatment plan with the patient. The patient was provided an opportunity to ask questions and all were answered. The patient agreed with the plan and demonstrated an understanding of the instructions.  Patient advised to call back or seek an in-person evaluation if the symptoms or condition worsens.  I personally spent a total of 60 minutes in the care of the patient today including preparing to see the patient, getting/reviewing separately obtained history, performing a medically appropriate exam/evaluation, counseling and educating, placing orders, and documenting clinical information in the EHR.   Note by: Wallie Sherry, MD (TTS and AI technology used. I apologize for any typographical errors that were not detected and corrected.) Date: 04/15/2024; Time: 10:34 AM

## 2024-04-21 ENCOUNTER — Ambulatory Visit
Admission: RE | Admit: 2024-04-21 | Discharge: 2024-04-21 | Disposition: A | Discharge status: Home / Self Care | Source: Ambulatory Visit | Attending: Student in an Organized Health Care Education/Training Program | Admitting: Student in an Organized Health Care Education/Training Program

## 2024-04-21 DIAGNOSIS — E1149 Type 2 diabetes mellitus with other diabetic neurological complication: Secondary | ICD-10-CM | POA: Insufficient documentation

## 2024-04-21 DIAGNOSIS — M47816 Spondylosis without myelopathy or radiculopathy, lumbar region: Secondary | ICD-10-CM | POA: Diagnosis not present

## 2024-04-21 DIAGNOSIS — M419 Scoliosis, unspecified: Secondary | ICD-10-CM | POA: Diagnosis not present

## 2024-04-21 DIAGNOSIS — G8929 Other chronic pain: Secondary | ICD-10-CM | POA: Diagnosis not present

## 2024-04-21 DIAGNOSIS — G894 Chronic pain syndrome: Secondary | ICD-10-CM | POA: Insufficient documentation

## 2024-04-21 DIAGNOSIS — M5416 Radiculopathy, lumbar region: Secondary | ICD-10-CM | POA: Diagnosis not present

## 2024-04-21 DIAGNOSIS — M51369 Other intervertebral disc degeneration, lumbar region without mention of lumbar back pain or lower extremity pain: Secondary | ICD-10-CM | POA: Diagnosis not present

## 2024-04-21 DIAGNOSIS — M4807 Spinal stenosis, lumbosacral region: Secondary | ICD-10-CM | POA: Diagnosis not present

## 2024-04-21 NOTE — Therapy (Incomplete)
 OUTPATIENT PHYSICAL THERAPY THORACOLUMBAR EVALUATION   Patient Name: Cynthia Sims MRN: [id number] DOB:[birth year], 73 y.o., female Today's Date: 04/21/2024  END OF SESSION:   Past Medical History:  Diagnosis Date   ALT (SGPT) level raised    Anemia    Anxiety    Arthritis    Depression    Diabetes mellitus without complication (HCC)    Diarrhea, functional    Elevated cholesterol    GERD (gastroesophageal reflux disease)    H. pylori infection    H/O adenomatous polyp of colon    Hemorrhoids    Hypertension    Kidney disease    Sleep apnea    Past Surgical History:  Procedure Laterality Date   abdominal blockage     CESAREAN SECTION  1983   CESAREAN SECTION     CHOLECYSTECTOMY     COLONOSCOPY N/A 02/20/2024   Procedure: COLONOSCOPY;  Surgeon: Toledo, Ladell POUR, MD;  Location: [facility] ENDOSCOPY;  Service: Gastroenterology;  Laterality: N/A;  IDDM/Plavix    COLONOSCOPY WITH PROPOFOL  N/A 10/29/2015   Procedure: COLONOSCOPY WITH PROPOFOL ;  Surgeon: Lamar ONEIDA Holmes, MD;  Location: [facility] ENDOSCOPY;  Service: Endoscopy;  Laterality: N/A;   COLONOSCOPY WITH PROPOFOL  N/A 11/07/2017   Procedure: COLONOSCOPY WITH PROPOFOL ;  Surgeon: Toledo, Ladell POUR, MD;  Location: [facility] ENDOSCOPY;  Service: Gastroenterology;  Laterality: N/A;   DILATION AND CURETTAGE OF UTERUS  2001   ESOPHAGOGASTRODUODENOSCOPY N/A 02/20/2024   Procedure: EGD (ESOPHAGOGASTRODUODENOSCOPY);  Surgeon: Toledo, Ladell POUR, MD;  Location: [facility] ENDOSCOPY;  Service: Gastroenterology;  Laterality: N/A;   ESOPHAGOGASTRODUODENOSCOPY (EGD) WITH PROPOFOL  N/A 10/29/2015   Procedure: ESOPHAGOGASTRODUODENOSCOPY (EGD) WITH PROPOFOL ;  Surgeon: Lamar ONEIDA Holmes, MD;  Location: [facility] ENDOSCOPY;  Service: Endoscopy;  Laterality: N/A;   LOWER EXTREMITY ANGIOGRAPHY Right 10/24/2022   Procedure: Lower Extremity Angiography;  Surgeon: Jama Cordella MATSU, MD;  Location: [facility] INVASIVE CV LAB;  Service: Cardiovascular;  Laterality: Right;   LOWER  EXTREMITY ANGIOGRAPHY Left 10/31/2022   Procedure: Lower Extremity Angiography;  Surgeon: Jama Cordella MATSU, MD;  Location: [facility] IN[facility] CV [facility];  Service: Cardiovascular;  Laterality: Left;   LOWER EXTREMITY ANGIOGRAPHY Right 10/23/2023   Procedure: Lower Extremity Angiography;  Surgeon: Jama Cordella MATSU, MD;  Location: [facility] IN[facility] CV [facility];  Service: Cardiovascular;  Laterality: Right;   LOWER EXTREMITY INTERVENTION Right 10/23/2023   Procedure: LOWER EXTREMITY INTERVENTION;  Surgeon: Jama Cordella MATSU, MD;  Location: [facility] INV[facility] CV [facility];  Service: Cardiovascular;  Laterality: Right;   MALONEY DILATION  02/20/2024   Procedure: DILATION, ESOPHAGUS, USING MALONEY DILATOR;  Surgeon: Aundria Ladell POUR, MD;  Location: [facility] ENDOSCOPY;  Service: Gastroenterology;;   Patient Active Problem List   Diagnosis Date Noted   Leg pain 01/19/2024   Abnormal uterine bleeding 07/30/2023   Obesity, class 1 05/20/2023   Type 2 diabetes mellitus with hyperlipidemia (HCC) 05/19/2023   Acute cystitis 05/18/2023   Hypotension 05/18/2023   Coronary artery disease 05/18/2023   Metabolic acidosis 02/27/2023   Hypoglycemia due to type 2 diabetes mellitus (HCC) 02/26/2023   Acute renal failure superimposed on stage 3b chronic kidney disease (HCC) 02/26/2023   Thrombocytopenia 02/26/2023   Anemia    Atherosclerosis of native arteries of extremity with intermittent claudication 02/19/2023   PAD (peripheral artery disease) 12/25/2022   Hernia of abdominal cavity 12/25/2022   Moderate nonproliferative diabetic retinopathy (HCC) 10/17/2022   Hypertensive retinopathy of both eyes 10/17/2022   Claudication of both lower extremities 08/23/2022   Essential hypertension 06/09/2022   Diabetic neuropathy (HCC) 05/22/2022  Mild cognitive disorder 03/13/2019   Chronic diarrhea 03/13/2019   Benign head tremor 03/13/2019   B12 deficiency 03/13/2019   H. pylori infection 01/04/2016   Esophageal dysphagia 09/13/2015    Acid reflux 09/13/2015   H/O adenomatous polyp of colon 09/13/2015   Anxiety 01/14/2015   Chronic kidney disease (CKD), stage III (moderate) (HCC) 01/13/2015   Apnea, sleep 12/09/2008   Leg varices 06/09/2008   Atony of bladder 12/14/2005   Hyperlipidemia associated with type 2 diabetes mellitus (HCC) 12/14/2005    PCP: Sharma Coyer, MD  REFERRING PROVIDER: Marcelino Nurse, MD  REFERRING DIAG:  M54.16,G89.29 (ICD-10-CM) - Chronic radicular lumbar pain M54.16 (ICD-10-CM) - Lumbar radiculopathy G89.4 (ICD-10-CM) - Chronic pain syndrome E11.49 (ICD-10-CM) - Other diabetic neurological complication associated with type 2 diabetes mellitus (HCC)  Rationale for Evaluation and Treatment: Rehabilitation  THERAPY DIAG:  No diagnosis found.  ONSET DATE: ***  SUBJECTIVE:                                                                                                                                                                                           SUBJECTIVE STATEMENT: ***  PERTINENT HISTORY:  Per MD note on 04/15/24, 73 year old female with type 2 diabetes who presents with leg pain following stent placement. She was referred by Vascular Surgery Dr Jama for right persistent leg pain.   Her leg pain began about one to two weeks after receiving two stents earlier this year, although she cannot recall the exact month of the procedure. The pain is located in her legs, particularly in the calf and heel, and sometimes radiates down from the buttock area. She describes the pain as persistent and states, 'all I know, it hurts.'   She has a history of stent placement in her legs, with one stent placed last year and two additional stents placed this year due to poor blood flow. After the recent stent placements, she was informed that her blood flow was good, yet the pain persisted.   She is currently on blood thinners as noted below and gabapentin  300 mg three times a day,  which does not alleviate her pain. She denies any side effects from gabapentin .   She experiences pain that extends to her feet, particularly in the calf and heel areas.  PAIN:  Are you having pain? {OPRCPAIN:27236}  PRECAUTIONS: None  RED FLAGS: {PT Red Flags:29287}   WEIGHT BEARING RESTRICTIONS: No  FALLS:  Has patient fallen in last 6 months? {fallsyesno:27318}  LIVING ENVIRONMENT: Lives with: {OPRC lives with:25569::lives with their family} Lives in: {Lives in:25570} Stairs: {opstairs:27293} Has following equipment at home: {Assistive  devices:23999}  OCCUPATION: ***  PLOF: {PLOF:24004}  PATIENT GOALS: ***  NEXT MD VISIT: ***  OBJECTIVE:  Note: Objective measures were completed at Evaluation unless otherwise noted.  DIAGNOSTIC FINDINGS:  EXAM: LUMBAR SPINE - COMPLETE 4+ VIEW IMPRESSION: No fracture, listhesis or advanced degenerative disease of the lumbar spine. Vertebral body heights and disc spaces are normal for age.  PATIENT SURVEYS:  Modified Oswestry:  MODIFIED OSWESTRY DISABILITY SCALE  Date: 04/22/24 Score  Pain intensity {ODI 1:32962}  2. Personal care (washing, dressing, etc.) {ODI 2:32963}  3. Lifting {ODI 3:32964}  4. Walking {ODI 4:32965}  5. Sitting {ODI 5:32966}  6. Standing {ODI 6:32967}  7. Sleeping {ODI 7:32968}  8. Social Life {ODI 8:32969}  9. Traveling {ODI 9:32970}  10. Employment/ Homemaking {ODI 10:32971}  Total ***/50   Interpretation of scores: Score Category Description  0-20% Minimal Disability The patient can cope with most living activities. Usually no treatment is indicated apart from advice on lifting, sitting and exercise  21-40% Moderate Disability The patient experiences more pain and difficulty with sitting, lifting and standing. Travel and social life are more difficult and they may be disabled from work. Personal care, sexual activity and sleeping are not grossly affected, and the patient can usually be managed  by conservative means  41-60% Severe Disability Pain remains the main problem in this group, but activities of daily living are affected. These patients require a detailed investigation  61-80% Crippled Back pain impinges on all aspects of the patient's life. Positive intervention is required  81-100% Bed-bound These patients are either bed-bound or exaggerating their symptoms  Bluford FORBES Zoe DELENA Karon DELENA, et al. Surgery versus conservative management of stable thoracolumbar fracture: the PRESTO feasibility RCT. [place] ([place]): [employer]; 2021 Nov. [facility] Technology Assessment, No. 25.62.) Appendix 3, Oswestry Disability Index category descriptors. Available from: Findjewelers.cz  Minimally Clinically Important Difference (MCID) = 12.8%  COGNITION: Overall cognitive status: {cognition:24006}     SENSATION: {sensation:27233}  MUSCLE LENGTH: Hamstrings: Right *** deg; Left *** deg Debby test: Right *** deg; Left *** deg  POSTURE: {posture:25561}  PALPATION: ***  LUMBAR ROM:   AROM eval  Flexion   Extension   Right lateral flexion   Left lateral flexion   Right rotation   Left rotation    (Blank rows = not tested)  LOWER EXTREMITY ROM:     {AROM/PROM:27142}  Right eval Left eval  Hip flexion    Hip extension    Hip abduction    Hip adduction    Hip internal rotation    Hip external rotation    Knee flexion    Knee extension    Ankle dorsiflexion    Ankle plantarflexion    Ankle inversion    Ankle eversion     (Blank rows = not tested)  LOWER EXTREMITY MMT:    MMT Right eval Left eval  Hip flexion    Hip extension    Hip abduction    Hip adduction    Hip internal rotation    Hip external rotation    Knee flexion    Knee extension    Ankle dorsiflexion    Ankle plantarflexion    Ankle inversion    Ankle eversion     (Blank rows = not tested)  LUMBAR SPECIAL TESTS:  {lumbar special  test:25242}  FUNCTIONAL TESTS:  30 seconds chair stand test 6 minute walk test: ***  GAIT: Distance walked: *** Assistive device utilized: {Assistive devices:23999} Level of assistance: {Levels  of assistance:24026} Comments: ***  TREATMENT DATE: 04/21/24                                                                                                                                 PATIENT EDUCATION:  Education details: HEP, POC, goals  Person educated: Patient Education method: Explanation, Demonstration, and Handouts Education comprehension: verbalized understanding and returned demonstration  HOME EXERCISE PROGRAM: ***  ASSESSMENT:  CLINICAL IMPRESSION: Patient is a 73 y.o. female who was seen today for physical therapy evaluation and treatment for ***.   OBJECTIVE IMPAIRMENTS: {opptimpairments:25111}.   ACTIVITY LIMITATIONS: {activitylimitations:27494}  PARTICIPATION LIMITATIONS: {participationrestrictions:25113}  PERSONAL FACTORS: {Personal factors:25162} are also affecting patient's functional outcome.   REHAB POTENTIAL: {rehabpotential:25112}  CLINICAL DECISION MAKING: {clinical decision making:25114}  EVALUATION COMPLEXITY: {Evaluation complexity:25115}   GOALS: Goals reviewed with patient? Yes  SHORT TERM GOALS: Target date: ***  *** Baseline: Goal status: INITIAL  2.  *** Baseline:  Goal status: INITIAL  3.  *** Baseline:  Goal status: INITIAL  4.  *** Baseline:  Goal status: INITIAL  5.  *** Baseline:  Goal status: INITIAL  6.  *** Baseline:  Goal status: INITIAL  LONG TERM GOALS: Target date: ***  *** Baseline:  Goal status: INITIAL  2.  *** Baseline:  Goal status: INITIAL  3.  *** Baseline:  Goal status: INITIAL  4.  *** Baseline:  Goal status: INITIAL  5.  *** Baseline:  Goal status: INITIAL  6.  *** Baseline:  Goal status: INITIAL  PLAN:  PT FREQUENCY: 1-2x/week  PT DURATION: 8 weeks  PLANNED  INTERVENTIONS: {rehab planned interventions:25118::97110-Therapeutic exercises,97530- Therapeutic [phone]- Neuromuscular re-education,97535- Self Rjmz,02859- Manual therapy,Patient/Family education}.  PLAN FOR NEXT SESSION: ***   Maryanne Finder, PT, DPT Physical Therapist -   [facility] 04/21/2024, 12:34 PM

## 2024-04-22 ENCOUNTER — Ambulatory Visit: Admitting: Physical Therapy

## 2024-04-29 DIAGNOSIS — E1142 Type 2 diabetes mellitus with diabetic polyneuropathy: Secondary | ICD-10-CM | POA: Diagnosis not present

## 2024-04-30 ENCOUNTER — Ambulatory Visit: Admitting: Physical Therapy

## 2024-05-01 NOTE — Progress Notes (Signed)
 Cynthia Sims                                          MRN: [id number]   05/01/2024   The [facility] Quality Team Specialist reviewed this patient medical record for the purposes of chart review for care gap closure. The following were reviewed: abstraction for care gap closure-controlling blood pressure.    [facility] Quality Team

## 2024-05-01 NOTE — Progress Notes (Signed)
 Cynthia Sims                                          MRN: [id number]   05/01/2024   The [facility] Quality Team Specialist reviewed this patient medical record for the purposes of chart review for care gap closure. The following were reviewed: chart review for care gap closure-kidney health evaluation for diabetes:eGFR  and uACR.    [facility] Quality Team

## 2024-05-05 ENCOUNTER — Ambulatory Visit

## 2024-05-05 ENCOUNTER — Ambulatory Visit: Admitting: Physical Therapy

## 2024-05-05 DIAGNOSIS — G8929 Other chronic pain: Secondary | ICD-10-CM | POA: Insufficient documentation

## 2024-05-05 DIAGNOSIS — Z23 Encounter for immunization: Secondary | ICD-10-CM | POA: Diagnosis not present

## 2024-05-05 DIAGNOSIS — G894 Chronic pain syndrome: Secondary | ICD-10-CM | POA: Insufficient documentation

## 2024-05-05 DIAGNOSIS — M5416 Radiculopathy, lumbar region: Secondary | ICD-10-CM | POA: Insufficient documentation

## 2024-05-05 NOTE — Progress Notes (Unsigned)
 Patient is present to receive annual high dose influenza vaccine. Injection was given in Left deltoid, patient tolerated injection well.    Patient here for influenza vaccination only.  I did not examine the patient.  I did review patient's medical history, medications, and allergies and vaccine consent form.  CMA gave vaccination. Patient tolerated well.  Rockie Agent, MD  [facility]  05/06/24

## 2024-05-05 NOTE — Progress Notes (Unsigned)
 PROVIDER NOTE: Interpretation of information contained herein should be left to medically-trained personnel. Specific patient instructions are provided elsewhere under Patient Instructions section of medical record. This document was created in part using AI and STT-dictation technology, any transcriptional errors that may result from this process are unintentional.  [name]  Service: E/M   PCP: Sharma Coyer, MD  DOB: 05/18/1951  DOS: 05/06/2024  Provider: Emmy MARLA Blanch, NP  MRN: [id number]  Delivery: Face-to-face  Specialty: Interventional Pain Management  Type: Established Patient  Setting: Ambulatory outpatient facility  Specialty designation: 09  Referring Prov.: Simmons-Robinson, Makie*  Location: Outpatient office facility       History of present illness (HPI) Ms. Cynthia Sims, a 73 y.o. year old female, is here today because of her No primary diagnosis found.. Cynthia Sims primary complain today is No chief complaint on file.  Pertinent problems: Cynthia Sims does not have any pertinent problems on file.  Pain Assessment: Severity of   is reported as a  /10. Location:    / . Onset:  . Quality:  . Timing:  . Modifying factor(s):  SABRA Vitals:  vitals were not taken for this visit.  BMI: Estimated body mass index is 25.77 kg/m as calculated from the following:   Height as of 04/15/24: 5' 1 (1.549 m).   Weight as of 04/15/24: 136 lb 6.4 oz (61.9 kg).  Last encounter: Visit date not found. Last procedure: Visit date not found.  Reason for encounter: follow-up evaluation.   Discussed the use of AI scribe software for clinical note transcription with the patient, who gave verbal consent to proceed.  History of Present Illness           Pharmacotherapy Assessment   {There is no content from the last Subjective section.}  Monitoring: [place] PMP: PDMP not reviewed this encounter. {Blank single:19197::Unable to conduct review of the controlled substance  reporting system due to technological failure.,     } Pharmacotherapy: {Blank single:19197::Opioid-induced constipation (OIC)(K59.03, T40.2X5A),No side-effects or adverse reactions reported.} Compliance: {Blank single:19197::Medication agreement violation - unsanctioned acquisition/use of additional opioid-containing medication,No problems identified.} Effectiveness: {Blank single:19197::Clinically acceptable.}  No notes on file  UDS:  No results found for: SUMMARY  No results found for: CBDTHCR No results found for: D8THCCBX No results found for: D9THCCBX  ROS  Constitutional: {Blank single:19197::Denies any fever or chills} Gastrointestinal: {Blank single:19197::No reported hemesis, hematochezia, vomiting, or acute GI distress} Musculoskeletal: {Blank single:19197::Denies any acute onset joint swelling, redness, loss of ROM, or weakness} Neurological: {Blank single:19197::No reported episodes of acute onset apraxia, aphasia, dysarthria, agnosia, amnesia, paralysis, loss of coordination, or loss of consciousness}  Medication Review  Basaglar  KwikPen, DULoxetine , Glucagon, Semaglutide(0.25 or 0.5MG /DOS), acetaminophen , aspirin  EC, carvedilol , cholestyramine , clopidogrel , cyanocobalamin , gabapentin , insulin  glargine, isosorbide  mononitrate, lovastatin , metFORMIN , nitroGLYCERIN , ondansetron , pantoprazole , and trimethoprim   History Review  Allergy: Cynthia Sims has no known allergies. Drug: Cynthia Sims  reports no history of drug use. Alcohol:  reports that she does not currently use alcohol. Tobacco:  reports that she has never smoked. She has never been exposed to tobacco smoke. She has never used smokeless tobacco. Social: Ms. Andujar  reports that she has never smoked. She has never been exposed to tobacco smoke. She has never used smokeless tobacco. She reports that she does not currently use alcohol. She reports that she does not use drugs. Medical:  has a  past medical history of ALT (SGPT) level raised, Anemia, Anxiety, Arthritis, Depression, Diabetes mellitus without complication (HCC), Diarrhea,  functional, Elevated cholesterol, GERD (gastroesophageal reflux disease), H. pylori infection, H/O adenomatous polyp of colon, Hemorrhoids, Hypertension, Kidney disease, and Sleep apnea. Surgical: Cynthia Sims  has a past surgical history that includes Cesarean section (1983); Dilation and curettage of uterus (2001); Cesarean section; Cholecystectomy; Colonoscopy with propofol  (N/A, 10/29/2015); Esophagogastroduodenoscopy (egd) with propofol  (N/A, 10/29/2015); Colonoscopy with propofol  (N/A, 11/07/2017); abdominal blockage; Lower Extremity Angiography (Right, 10/24/2022); Lower Extremity Angiography (Left, 10/31/2022); LOWER EXTREMITY INTERVENTION (Right, 10/23/2023); Lower Extremity Angiography (Right, 10/23/2023); Colonoscopy (N/A, 02/20/2024); Esophagogastroduodenoscopy (N/A, 02/20/2024); and maloney dilation (02/20/2024). Family: family history includes Alzheimer's disease in her mother; Breast cancer (age of onset: 1) in her mother; COPD in her maternal grandmother and mother; Diabetes in her father, paternal grandfather, sister, sister, and sister; Heart attack in her father; Hypertension in her father; Lupus in her father; Osteoporosis in her mother.  Laboratory Chemistry Profile   Renal Lab Results  Component Value Date   BUN 21 10/23/2023   CREATININE 1.55 (H) 10/23/2023   BCR 13 10/08/2023   GFRAA 41 (L) 10/06/2019   GFRNONAA 35 (L) 10/23/2023    Hepatic Lab Results  Component Value Date   AST 18 05/18/2023   ALT 12 05/18/2023   ALBUMIN 2.5 (L) 05/18/2023   ALKPHOS 54 05/18/2023   AMYLASE 154 (H) 02/26/2019   LIPASE 74 (H) 05/17/2023    Electrolytes Lab Results  Component Value Date   NA 142 10/08/2023   K 4.3 10/08/2023   CL 110 (H) 10/08/2023   CALCIUM 7.6 (L) 10/08/2023   MG 1.4 (L) 03/01/2012    Bone No results found for: VD25OH,  [id number], CI6874NY7, CI7874NY7, 25OHVITD1, 25OHVITD2, 25OHVITD3, TESTOFREE, TESTOSTERONE  Inflammation (CRP: Acute Phase) (ESR: Chronic Phase) Lab Results  Component Value Date   LATICACIDVEN 0.9 05/18/2023         Note: {Blank single:19197::Ms. Case indicates labs done and monitored by primary care practitioner using a non-[facility] EMR system,No results found under the [facility] electronic medical record,Patient noncompliant with Lab Work orders.,Results made available to patient.,Lab results reviewed and made available to patient.,Lab results reviewed and explained to patient in Layman's terms.,Above Lab results reviewed.}  Recent Imaging Review  MR LUMBAR SPINE WO CONTRAST MR LUMBAR SPINE WITHOUT IV CONTRAST  COMPARISON: X-ray 04/15/2024  CLINICAL HISTORY: Back pain.  TECHNIQUE: SAG T2, SAG T1, SAG STIR, AX T2, AX T1 without IV contrast.  FINDINGS: There is mild levoscoliosis of the lumbar spine with multilevel disc desiccation in the mid to lower lumbar spine. Mild multilevel facet arthrosis is present. No significant Modic changes are identified. There is no vertebral body height loss, subluxation or marrow replacing process. The sacrum and SI joints are unremarkable so far as visualized. Conus and cauda equina are unremarkable.  T12-L1: There is no focal disc protrusion, foraminal or spinal stenosis.  L1-2: There is no focal protrusion, foraminal or spinal stenosis.  L2-3: Mild broad-based disc osteophyte and mild facet arthrosis. There is slight caudal foraminal narrowing, right greater than left. There is no impingement of the exiting right L2 nerve. Mild facet arthrosis is present.  L3-4: There is a broad-based bulge with a central protrusion slightly effacing the ventral thecal sac. No significant spinal or foraminal stenosis is identified. Mild facet arthrosis is present.  L4-5: Mild broad-based bulge and small annular tear  slightly effacing the ventral thecal sac. There is mild crowding of the descending nerve roots in lateral recess without impingement. No significant foraminal narrowing is present. Mild facet arthrosis is present.  L5-S1:  There is a moderate-sized broad-based bulge with a right paracentral component. This crowding and likely impingement of the descending right S1 nerve root in the right lateral recess. There is mild caudal foraminal narrowing without impingement the exiting right L5 nerve. Mild facet arthrosis is present.  The retroperitoneal structures demonstrate no significant abnormality.  IMPRESSION: Mild degenerative spondylosis and levoscoliosis.  Multilevel disc bulges are present most notably at L3-4, L4-5 and L5-S1. There is mild effacement of ventral thecal sac without significant spinal stenosis. There is crowding of the descending nerve roots in the lateral recess, most notably at L5-S1 on the right. Correlation for right S1 radicular symptoms. There is mild caudal foraminal narrowing at L5-S1. No significant spinal or foraminal stenosis is otherwise identified.  Electronically signed by: Norleen Satchel MD 04/22/2024 04:37 PM EST RP Workstation: [id number] Note: {Blank single:19197::No new results found.,No results found under the [facility] electronic medical record.,Imaging results reviewed and explained to patient in Layman's terms.,Results of ordered imaging test(s) reviewed and explained to patient in Layman's terms.,Imaging results reviewed.,Reviewed} {Blank single:19197::Results visible under [facility].,Results visible under [facility].,Results visible under [facility].,Results visible under [facility].,Results visible under Care Everywhere.,Results made available to patient.,Copy of results provided to patient.,Patient noncompliant with diagnostic imaging orders.,     }  Physical Exam  Vitals: There were no vitals taken for this  visit. BMI: Estimated body mass index is 25.77 kg/m as calculated from the following:   Height as of 04/15/24: 5' 1 (1.549 m).   Weight as of 04/15/24: 136 lb 6.4 oz (61.9 kg). Ideal: Patient weight not recorded General appearance: {general exam:210120802::Well nourished, well developed, and well hydrated. In no apparent acute distress} Mental status: {Blank single:19197::Alert and oriented x 3. Exaggerated physical and/or psychosocial pain behavior perceived.,Alert, oriented x 3 (person, place, & time)} {Blank single:19197::Ms. Ventress's speech pattern and demeanor seems to suggest oversedation,     } Respiratory: {Blank single:19197::Oxygen-dependent COPD,No evidence of acute respiratory distress} Eyes: {Blank single:19197::Miotic (pupilary constriction) due to opiate use,Midriatic,Anisocoric,Evidence of ptosis,Pin-point pupils,PERLA}   Assessment   Diagnosis Status  No diagnosis found. {Blank single:19197::Absent,Deteriorating,Having a Flare-up,Improved,Improving,Not improving,Not responding,Persistent,Present,Recurring,Reoccurring,Resolved,Responding,Stable,Unchanged,improved,Worsened,Worsening,Controlled} {Blank single:19197::Absent,Deteriorating,Having a Flare-up,Improved,Improving,Not improving,Not responding,Persistent,Present,Recurring,Reoccurring,Resolved,Responding,Stable,Unchanged,improved,Worsened,Worsening,Controlled} {Blank single:19197::Absent,Deteriorating,Having a Flare-up,Improved,Improving,Not improving,Not responding,Persistent,Present,Recurring,Reoccurring,Resolved,Responding,Stable,Unchanged,improved,Worsened,Worsening,Controlled}   Updated Problems: No problems updated.  Plan of Care  Problem-specific:  Assessment and Plan            Ms. KENYETTA WIMBISH has a current medication list which includes the following long-term  medication(s): carvedilol , cholestyramine , duloxetine , gabapentin , basaglar  kwikpen, insulin  glargine, isosorbide  mononitrate, lovastatin , metformin , nitroglycerin , and pantoprazole .  Pharmacotherapy (Medications Ordered): No orders of the defined types were placed in this encounter.  Orders:  No orders of the defined types were placed in this encounter.    {There is no content from the last Plan section.}   No follow-ups on file.    Recent Visits Date Type Provider Dept  04/15/24 Office Visit Marcelino Nurse, MD [facility]  Showing recent visits within past 90 days and meeting all other requirements Future Appointments Date Type Provider Dept  05/06/24 Appointment Homero Hyson K, NP [facility]  Showing future appointments within next 90 days and meeting all other requirements  I discussed the assessment and treatment plan with the patient. The patient was provided an opportunity to ask questions and all were answered. The patient agreed with the plan and demonstrated an understanding of the instructions.  Patient advised to call back or seek an in-person evaluation if the symptoms or condition worsens.  Duration of encounter: *** minutes.  Total time on encounter, as per AMA guidelines included both the face-to-face and non-face-to-face time personally spent by the physician and/or other qualified health care professional(s) on the day of the encounter (includes time in activities that require the physician or other qualified health care professional and does not include time in activities normally performed by clinical staff). Physician's time may include the following activities when performed: Preparing to see the patient (e.g., pre-charting review of records, searching for previously ordered imaging, lab work, and nerve conduction tests) Review of prior analgesic pharmacotherapies. Reviewing PMP Interpreting ordered tests (e.g., lab work, imaging, nerve  conduction tests) Performing post-procedure evaluations, including interpretation of diagnostic procedures Obtaining and/or reviewing separately obtained history Performing a medically appropriate examination and/or evaluation Counseling and educating the patient/family/caregiver Ordering medications, tests, or procedures Referring and communicating with other health care professionals (when not separately reported) Documenting clinical information in the electronic or other health record Independently interpreting results (not separately reported) and communicating results to the patient/ family/caregiver Care coordination (not separately reported)  Note by: Shawntia Mangal K Zella Dewan, NP (TTS and AI technology used. I apologize for any typographical errors that were not detected and corrected.) Date: 05/06/2024; Time: 3:06 PM

## 2024-05-06 ENCOUNTER — Encounter: Payer: Self-pay | Admitting: Nurse Practitioner

## 2024-05-06 ENCOUNTER — Ambulatory Visit: Attending: Nurse Practitioner | Admitting: Nurse Practitioner

## 2024-05-06 VITALS — BP 136/74 | HR 69 | Temp 97.0°F | Resp 18 | Ht 61.0 in | Wt 136.0 lb

## 2024-05-06 DIAGNOSIS — M5416 Radiculopathy, lumbar region: Secondary | ICD-10-CM | POA: Diagnosis not present

## 2024-05-06 DIAGNOSIS — Z7984 Long term (current) use of oral hypoglycemic drugs: Secondary | ICD-10-CM

## 2024-05-06 DIAGNOSIS — R937 Abnormal findings on diagnostic imaging of other parts of musculoskeletal system: Secondary | ICD-10-CM | POA: Diagnosis not present

## 2024-05-06 DIAGNOSIS — G894 Chronic pain syndrome: Secondary | ICD-10-CM | POA: Diagnosis not present

## 2024-05-06 DIAGNOSIS — E1149 Type 2 diabetes mellitus with other diabetic neurological complication: Secondary | ICD-10-CM | POA: Insufficient documentation

## 2024-05-06 DIAGNOSIS — G8929 Other chronic pain: Secondary | ICD-10-CM | POA: Insufficient documentation

## 2024-05-06 DIAGNOSIS — D696 Thrombocytopenia, unspecified: Secondary | ICD-10-CM | POA: Diagnosis not present

## 2024-05-06 DIAGNOSIS — M79604 Pain in right leg: Secondary | ICD-10-CM | POA: Insufficient documentation

## 2024-05-06 DIAGNOSIS — Z9229 Personal history of other drug therapy: Secondary | ICD-10-CM | POA: Insufficient documentation

## 2024-05-06 DIAGNOSIS — Z7901 Long term (current) use of anticoagulants: Secondary | ICD-10-CM | POA: Diagnosis not present

## 2024-05-06 NOTE — Patient Instructions (Signed)
 ______________________________________________________________________    Procedure instructions  Stop blood-thinners  Do not eat or drink fluids (other than water) for 6 hours before your procedure  No water for 2 hours before your procedure  Take your blood pressure medicine with a sip of water  Arrive 30 minutes before your appointment  If sedation is planned, bring suitable driver. Nada, [place]isele, & public transportation are NOT APPROVED)  Carefully read the Preparing for your procedure detailed instructions  ______________________________________________________________________    Preparing for your procedure  Appointments: If you think you may not be able to keep your appointment, call 24-48 hours in advance to cancel. We need time to make it available to others.  Procedure visits are for procedures only. During your procedure appointment there will be: NO Prescription Refills*. NO medication changes or discussions*. NO discussion of disability issues*. NO unrelated pain problem evaluations*. NO evaluations to order other pain procedures*. *These will be addressed at a separate and distinct evaluation encounter on the provider's evaluation schedule and not during procedure days.  Instructions: Food intake: Avoid eating anything solid for at least 8 hours prior to your procedure. Clear liquid intake: You may take clear liquids such as water up to 2 hours prior to your procedure. (No carbonated drinks. No soda.) Transportation: Unless otherwise stated by your physician, bring a driver. (Driver cannot be a Market Researcher, Pharmacist, Community, or any other form of public transportation.) Morning Medicines: Except for blood thinners, take all of your other morning medications with a sip of water. Make sure to take your heart and blood pressure medicines. If your blood pressure's lower number is above 100, the case will be rescheduled. Blood thinners: Make sure to stop your blood thinners as  instructed.  If you take a blood thinner, but were not instructed to stop it, call our office [phone] and ask to talk to a nurse. Not stopping a blood thinner prior to certain procedures could lead to serious complications. Diabetics on insulin : Notify the staff so that you can be scheduled 1st case in the morning. If your diabetes requires high dose insulin , take only  of your normal insulin  dose the morning of the procedure and notify the staff that you have done so. Preventing infections: Shower with an antibacterial soap the morning of your procedure.  Build-up your immune system: Take 1000 mg of Vitamin C with every meal (3 times a day) the day prior to your procedure. Antibiotics: Inform the nursing staff if you are taking any antibiotics or if you have any conditions that may require antibiotics prior to procedures. (Example: recent joint implants)   Pregnancy: If you are pregnant make sure to notify the nursing staff. Not doing so may result in injury to the fetus, including death.  Sickness: If you have a cold, fever, or any active infections, call and cancel or reschedule your procedure. Receiving steroids while having an infection may result in complications. Arrival: You must be in the facility at least 30 minutes prior to your scheduled procedure. Tardiness: Your scheduled time is also the cutoff time. If you do not arrive at least 15 minutes prior to your procedure, you will be rescheduled.  Children: Do not bring any children with you. Make arrangements to keep them home. Dress appropriately: There is always a possibility that your clothing may get soiled. Avoid long dresses. Valuables: Do not bring any jewelry or valuables.  Reasons to call and reschedule or cancel your procedure: (Following these recommendations will minimize the risk of a  serious complication.) Surgeries: Avoid having procedures within 2 weeks of any surgery. (Avoid for 2 weeks before or after any  surgery). Flu Shots: Avoid having procedures within 2 weeks of a flu shots or . (Avoid for 2 weeks before or after immunizations). Barium: Avoid having a procedure within 7-10 days after having had a radiological study involving the use of radiological contrast. (Myelograms, Barium swallow or enema study). Heart attacks: Avoid any elective procedures or surgeries for the initial 6 months after a Myocardial Infarction (Heart Attack). Blood thinners: It is imperative that you stop these medications before procedures. Let us  know if you if you take any blood thinner.  Infection: Avoid procedures during or within two weeks of an infection (including chest colds or gastrointestinal problems). Symptoms associated with infections include: Localized redness, fever, chills, night sweats or profuse sweating, burning sensation when voiding, cough, congestion, stuffiness, runny nose, sore throat, diarrhea, nausea, vomiting, cold or Flu symptoms, recent or current infections. It is specially important if the infection is over the area that we intend to treat. Heart and lung problems: Symptoms that may suggest an active cardiopulmonary problem include: cough, chest pain, breathing difficulties or shortness of breath, dizziness, ankle swelling, uncontrolled high or unusually low blood pressure, and/or palpitations. If you are experiencing any of these symptoms, cancel your procedure and contact your primary care physician for an evaluation.  Remember:  Regular Business hours are:  Monday to Thursday 8:00 AM to 4:00 PM  Provider's Schedule: Eric Como, MD:  Procedure days: Tuesday and Thursday 7:30 AM to 4:00 PM  Wallie Sherry, MD:  Procedure days: Monday and Wednesday 7:30 AM to 4:00 PM Last  Updated: 05/22/2023 ______________________________________________________________________     ______________________________________________________________________    Blood Thinners  IMPORTANT NOTICE:  If  you take any of these, make sure to notify the nursing staff.  Failure to do so may result in serious injury.  Recommended time intervals to stop and restart blood-thinners, before & after invasive procedures  Generic Name Brand Name Pre-procedure: Stop medication for this amount of time before your procedure: Post-procedure: Wait this amount of time after the procedure before restarting your medication:  Abciximab Reopro 15 days 2 hrs  Alteplase Activase 10 days 10 days  Anagrelide Agrylin    Apixaban Eliquis 3 days 6 hrs  Cilostazol Pletal 3 days 5 hrs  Clopidogrel Plavix 7-10 days 2 hrs  Dabigatran Pradaxa 5 days 6 hrs  Dalteparin Fragmin 24 hours 4 hrs  Dipyridamole Aggrenox 11days 2 hrs  Edoxaban Lixiana; Savaysa 3 days 2 hrs  Enoxaparin   Lovenox  24 hours 4 hrs  Eptifibatide Integrillin 8 hours 2 hrs  Fondaparinux  Arixtra 72 hours 12 hrs  Hydroxychloroquine Plaquenil 11 days   Prasugrel Effient 7-10 days 6 hrs  Reteplase Retavase 10 days 10 days  Rivaroxaban Xarelto 3 days 6 hrs  Ticagrelor Brilinta 5-7 days 6 hrs  Ticlopidine Ticlid 10-14 days 2 hrs  Tinzaparin Innohep 24 hours 4 hrs  Tirofiban Aggrastat 8 hours 2 hrs  Warfarin Coumadin 5 days 2 hrs   Other medications with blood-thinning effects  NOTE: Consider stopping these if you have prolonged bleeding despite not taking any of the above blood thinners. Otherwise ask your provider and this will be decided on a case-by-case basis.  Product indications Generic (Brand) names Note  Cholesterol Lipitor Stop 4 days before procedure  Blood thinner (injectable) Heparin (LMW or LMWH Heparin) Stop 24 hours before procedure  Cancer Ibrutinib (Imbruvica) Stop 7 days before procedure  Malaria/Rheumatoid Hydroxychloroquine (Plaquenil) Stop 11 days before procedure  Thrombolytics  10 days before or after procedures   Over-the-counter (OTC) Products with blood-thinning effects  NOTE: Consider stopping these if you have prolonged  bleeding despite not taking any of the above blood thinners. Otherwise ask your provider and this will be decided on a case-by-case basis.  Product Common names Stop Time  Aspirin  > 325 mg Goody Powders, Excedrin, etc. 11 days  Aspirin  <= 81 mg  7 days  Fish oil  4 days  Garlic supplements  7 days  Ginkgo biloba  36 hours  Ginseng  24 hours  NSAIDs Ibuprofen, Naprosyn, etc. 3 days  Vitamin E  4 days   ______________________________________________________________________     If you have questions call us  at [phone]  Procedure appointments are for procedures only.   NO medication refills or new problem evaluations will be done on procedure days.   Only the scheduled, pre-approved procedure and side will be done.   ______________________________________________________________________

## 2024-05-06 NOTE — Progress Notes (Signed)
 Safety precautions to be maintained throughout the outpatient stay will include: orient to surroundings, keep bed in low position, maintain call bell within reach at all times, provide assistance with transfer out of bed and ambulation.

## 2024-05-08 ENCOUNTER — Other Ambulatory Visit: Payer: Self-pay | Admitting: Medical Genetics

## 2024-05-12 ENCOUNTER — Telehealth: Payer: Self-pay

## 2024-05-12 ENCOUNTER — Ambulatory Visit: Admitting: Physical Therapy

## 2024-05-12 NOTE — Telephone Encounter (Signed)
 Schnier, Cordella MATSU, MD  Erlene Doyal SAUNDERS, CMA Her most recent intervention was back in May (>6 months) so that will be fine: stop ASA three days and Plavix  7 days  Cathlyn       Previous Messages    ----- Message ----- From: Erlene Doyal SAUNDERS, CMA Sent: 05/06/2024   9:43 AM EST To: Cordella MATSU Shawl, MD  Good Morning,  I am reaching out from the [facility], We are asking for clearance for Mrs. Schuessler to stop her Aspirin  81mg  for 3 days prior and her Plavix  75mg  7 days prior to having a Lumbar Epidural Injection with Dr. Kennard.Please let us  know us  know if this ok, I have also instructed the patient to reach out as well.  Best,  Doyal Erlene, CMA [facility]

## 2024-05-13 ENCOUNTER — Other Ambulatory Visit: Payer: Self-pay | Admitting: Family Medicine

## 2024-05-13 DIAGNOSIS — E0842 Diabetes mellitus due to underlying condition with diabetic polyneuropathy: Secondary | ICD-10-CM

## 2024-05-13 DIAGNOSIS — F411 Generalized anxiety disorder: Secondary | ICD-10-CM

## 2024-05-15 ENCOUNTER — Ambulatory Visit: Admitting: Physical Therapy

## 2024-05-19 ENCOUNTER — Telehealth: Payer: Self-pay | Admitting: *Deleted

## 2024-05-19 DIAGNOSIS — Z95828 Presence of other vascular implants and grafts: Secondary | ICD-10-CM | POA: Insufficient documentation

## 2024-05-19 DIAGNOSIS — I739 Peripheral vascular disease, unspecified: Secondary | ICD-10-CM | POA: Insufficient documentation

## 2024-05-19 DIAGNOSIS — M47816 Spondylosis without myelopathy or radiculopathy, lumbar region: Secondary | ICD-10-CM | POA: Insufficient documentation

## 2024-05-19 NOTE — Telephone Encounter (Signed)
 Call to patient to verify that she stopped Plavix  prior to procedure for tomorrow.  She did and she does have medical clearance from Dr Dreama.

## 2024-05-19 NOTE — Progress Notes (Unsigned)
 PROVIDER NOTE: Interpretation of information contained herein should be left to medically-trained personnel. Specific patient instructions are provided elsewhere under Patient Instructions section of medical record. This document was created in part using STT-dictation technology, any transcriptional errors that may result from this process are unintentional.  [name] Type: Established DOB: Dec 02, 1950 MRN: [id number] PCP: Sharma Coyer, MD  Service: Procedure DOS: 05/20/2024 Setting: Ambulatory Location: Ambulatory outpatient facility Delivery: Face-to-face Provider: Eric DELENA Como, MD Specialty: Interventional Pain Management Specialty designation: 09 Location: Outpatient facility Ref. Prov.: Simmons-Robinson, Makie*       Interventional Therapy   Type: Lumbar epidural steroid injection (LESI) (interlaminar) #1    Laterality: Right   Level:  L5-S1 Level.  Imaging: Fluoroscopic guidance Spinal ([id number]) Anesthesia: Local anesthesia (1-2% Lidocaine ) Anxiolysis: IV Versed                     Sedation:                         DOS: 05/20/2024  Performed by: Eric DELENA Como, MD  Purpose: Diagnostic/Therapeutic Indications: Lumbar radicular pain of intraspinal etiology of more than 4 weeks that has failed to respond to conservative therapy and is severe enough to impact quality of life or function. 1. Chronic lower extremity pain (Right)   2. Chronic lumbar radicular pain (Right)   3. History of intravascular lower extremity stent placement (Bilateral)   4. Lumbar facet arthropathy (Bilateral)   5. Abnormal MRI, lumbar spine   6. Peripheral vascular disease   7. Chronic anticoagulation (Plavix )    NAS-11 Pain score:   Pre-procedure:  /10   Post-procedure:  /10      Position / Prep / Materials:  Position: Prone w/ head of the table raised (slight reverse trendelenburg) to facilitate breathing.  Prep solution: ChloraPrep (2% chlorhexidine   gluconate and 70% isopropyl alcohol) Prep Area: Entire Posterior Lumbar Region from lower scapular tip down to mid buttocks area and from flank to flank. Materials:  Tray: Epidural tray Needle(s):  Type: Epidural needle (Tuohy) Gauge (G):  17 Length: Regular (3.5-in) Qty: 1  H&P (Pre-op Assessment):  [name] is a 73 y.o. (year old), female patient, seen today for interventional treatment. She  has a past surgical history that includes Cesarean section (1983); Dilation and curettage of uterus (2001); Cesarean section; Cholecystectomy; Colonoscopy with propofol  (N/A, 10/29/2015); Esophagogastroduodenoscopy (egd) with propofol  (N/A, 10/29/2015); Colonoscopy with propofol  (N/A, 11/07/2017); abdominal blockage; Lower Extremity Angiography (Right, 10/24/2022); Lower Extremity Angiography (Left, 10/31/2022); LOWER EXTREMITY INTERVENTION (Right, 10/23/2023); Lower Extremity Angiography (Right, 10/23/2023); Colonoscopy (N/A, 02/20/2024); Esophagogastroduodenoscopy (N/A, 02/20/2024); and maloney dilation (02/20/2024). [name] has a current medication list which includes the following prescription(s): acetaminophen , aspirin  ec, carvedilol , cholestyramine , clopidogrel , cyanocobalamin , duloxetine , gabapentin , gvoke hypopen 2-pack, basaglar  kwikpen, insulin  glargine, isosorbide  mononitrate, lovastatin , metformin , nitroglycerin , ondansetron , pantoprazole , semaglutide(0.25 or 0.5mg /dos), and trimethoprim . Her primarily concern today is the No chief complaint on file.  Initial Vital Signs:  Pulse/HCG Rate:    Temp:   Resp:   BP:   SpO2:    BMI: Estimated body mass index is 25.7 kg/m as calculated from the following:   Height as of 05/06/24: 5' 1 (1.549 m).   Weight as of 05/06/24: 136 lb (61.7 kg).  Risk Assessment: Allergies: Reviewed. She has no known allergies.  Allergy Precautions: None required Coagulopathies: Reviewed. None identified.  Blood-thinner therapy: None at this time Active Infection(s):  Reviewed. None identified. [name] is afebrile  Site Confirmation: Ms. Carrier was asked to confirm the procedure and laterality before marking the site Procedure checklist: Completed Consent: Before the procedure and under the influence of no sedative(s), amnesic(s), or anxiolytics, the patient was informed of the treatment options, risks and possible complications. To fulfill our ethical and legal obligations, as recommended by the American Medical Association's Code of Ethics, I have informed the patient of my clinical impression; the nature and purpose of the treatment or procedure; the risks, benefits, and possible complications of the intervention; the alternatives, including doing nothing; the risk(s) and benefit(s) of the alternative treatment(s) or procedure(s); and the risk(s) and benefit(s) of doing nothing. The patient was provided information about the general risks and possible complications associated with the procedure. These may include, but are not limited to: failure to achieve desired goals, infection, bleeding, organ or nerve damage, allergic reactions, paralysis, and death. In addition, the patient was informed of those risks and complications associated to Spine-related procedures, such as failure to decrease pain; infection (i.e.: Meningitis, epidural or intraspinal abscess); bleeding (i.e.: epidural hematoma, subarachnoid hemorrhage, or any other type of intraspinal or peri-dural bleeding); organ or nerve damage (i.e.: Any type of peripheral nerve, nerve root, or spinal cord injury) with subsequent damage to sensory, motor, and/or autonomic systems, resulting in permanent pain, numbness, and/or weakness of one or several areas of the body; allergic reactions; (i.e.: anaphylactic reaction); and/or death. Furthermore, the patient was informed of those risks and complications associated with the medications. These include, but are not limited to: allergic reactions (i.e.:  anaphylactic or anaphylactoid reaction(s)); adrenal axis suppression; blood sugar elevation that in diabetics may result in ketoacidosis or comma; water retention that in patients with history of congestive heart failure may result in shortness of breath, pulmonary edema, and decompensation with resultant heart failure; weight gain; swelling or edema; medication-induced neural toxicity; particulate matter embolism and blood vessel occlusion with resultant organ, and/or nervous system infarction; and/or aseptic necrosis of one or more joints. Finally, the patient was informed that Medicine is not an exact science; therefore, there is also the possibility of unforeseen or unpredictable risks and/or possible complications that may result in a catastrophic outcome. The patient indicated having understood very clearly. We have given the patient no guarantees and we have made no promises. Enough time was given to the patient to ask questions, all of which were answered to the patient's satisfaction. Ms. Wands has indicated that she wanted to continue with the procedure. Attestation: I, the ordering provider, attest that I have discussed with the patient the benefits, risks, side-effects, alternatives, likelihood of achieving goals, and potential problems during recovery for the procedure that I have provided informed consent. Date  Time: {CHL [facility] TIME CHOICES:21018001}  Pre-Procedure Preparation:  Monitoring: As per clinic protocol. Respiration, ETCO2, SpO2, BP, heart rate and rhythm monitor placed and checked for adequate function Safety Precautions: Patient was assessed for positional comfort and pressure points before starting the procedure. Time-out: I initiated and conducted the Time-out before starting the procedure, as per protocol. The patient was asked to participate by confirming the accuracy of the Time Out information. Verification of the correct person, site, and procedure were performed  and confirmed by me, the nursing staff, and the patient. Time-out conducted as per Joint Commission's Universal Protocol (UP.01.01.01). Time:   Start Time:   hrs.  Description/Narrative of Procedure:          Target: Epidural space via interlaminar opening, initially targeting the lower laminar border of  the superior vertebral body. Region: Lumbar Approach: Percutaneous paravertebral  Rationale (medical necessity): procedure needed and proper for the diagnosis and/or treatment of the patient's medical symptoms and needs. Procedural Technique Safety Precautions: Aspiration looking for blood return was conducted prior to all injections. At no point did we inject any substances, as a needle was being advanced. No attempts were made at seeking any paresthesias. Safe injection practices and needle disposal techniques used. Medications properly checked for expiration dates. SDV (single dose vial) medications used. Description of the Procedure: Protocol guidelines were followed. The procedure needle was introduced through the skin, ipsilateral to the reported pain, and advanced to the target area. Bone was contacted and the needle walked caudad, until the lamina was cleared. The epidural space was identified using "loss-of-resistance technique" with 2-3 ml of PF-NaCl (0.9% NSS), in a 5cc LOR glass syringe.  There were no vitals filed for this visit.  Start Time:   hrs. End Time:   hrs.  Imaging Guidance (Spinal):          Type of Imaging Technique: Fluoroscopy Guidance (Spinal) Indication(s): Fluoroscopy guidance for needle placement to enhance accuracy in procedures requiring precise needle localization for targeted delivery of medication in or near specific anatomical locations not easily accessible without such real-time imaging assistance. Exposure Time: Please see nurses notes. Contrast: Before injecting any contrast, we confirmed that the patient did not have an allergy to iodine , shellfish,  or radiological contrast. Once satisfactory needle placement was completed at the desired level, radiological contrast was injected. Contrast injected under live fluoroscopy. No contrast complications. See chart for type and volume of contrast used. Fluoroscopic Guidance: I was personally present during the use of fluoroscopy. Tunnel Vision Technique used to obtain the best possible view of the target area. Parallax error corrected before commencing the procedure. Direction-depth-direction technique used to introduce the needle under continuous pulsed fluoroscopy. Once target was reached, antero-posterior, oblique, and lateral fluoroscopic projection used confirm needle placement in all planes. Images permanently stored in EMR. Interpretation: I personally interpreted the imaging intraoperatively. Adequate needle placement confirmed in multiple planes. Appropriate spread of contrast into desired area was observed. No evidence of afferent or efferent intravascular uptake. No intrathecal or subarachnoid spread observed. Permanent images saved into the patient's record.  Antibiotic Prophylaxis:   Anti-infectives (From admission, onward)    None      Indication(s): None identified  Post-operative Assessment:  Post-procedure Vital Signs:  Pulse/HCG Rate:    Temp:   Resp:   BP:   SpO2:    EBL: None  Complications: No immediate post-treatment complications observed by team, or reported by patient.  Note: The patient tolerated the entire procedure well. A repeat set of vitals were taken after the procedure and the patient was kept under observation following institutional policy, for this type of procedure. Post-procedural neurological assessment was performed, showing return to baseline, prior to discharge. The patient was provided with post-procedure discharge instructions, including a section on how to identify potential problems. Should any problems arise concerning this procedure, the  patient was given instructions to immediately contact us , at any time, without hesitation. In any case, we plan to contact the patient by telephone for a follow-up status report regarding this interventional procedure.  Comments:  No additional relevant information.  Plan of Care (POC)  Orders:  No orders of the defined types were placed in this encounter.   {There is no content from the last Subjective section.}   Medications ordered for procedure: No  orders of the defined types were placed in this encounter.  Medications administered: Mckinzey C. Matte had no medications administered during this visit.  See the medical record for exact dosing, route, and time of administration.    {There is no content from the last Plan section.}    Follow-up plan:   No follow-ups on file.     Recent Visits Date Type Provider Dept  05/06/24 Office Visit Patel, Seema K, NP [facility]  04/15/24 Office Visit Marcelino Nurse, MD [facility]  Showing recent visits within past 90 days and meeting all other requirements Future Appointments Date Type Provider Dept  05/20/24 Appointment Tanya Glisson, MD [facility]  Showing future appointments within next 90 days and meeting all other requirements   Disposition: Discharge home  Discharge (Date  Time): 05/20/2024;   hrs.   Primary Care Physician: Sharma Coyer, MD Location: [facility] Note by: Glisson DELENA Tanya, MD (TTS technology used. I apologize for any typographical errors that were not detected and corrected.) Date: 05/20/2024; Time: 7:56 AM  Disclaimer:  Medicine is not an visual merchandiser. The only guarantee in medicine is that nothing is guaranteed. It is important to note that the decision to proceed with this intervention was based on the information collected from the patient. The Data and conclusions were drawn from the patient's questionnaire, the interview, and  the physical examination. Because the information was provided in large part by the patient, it cannot be guaranteed that it has not been purposely or unconsciously manipulated. Every effort has been made to obtain as much relevant data as possible for this evaluation. It is important to note that the conclusions that lead to this procedure are derived in large part from the available data. Always take into account that the treatment will also be dependent on availability of resources and existing treatment guidelines, considered by other Pain Management Practitioners as being common knowledge and practice, at the time of the intervention. For Medico-Legal purposes, it is also important to point out that variation in procedural techniques and pharmacological choices are the acceptable norm. The indications, contraindications, technique, and results of the above procedure should only be interpreted and judged by a Board-Certified Interventional Pain Specialist with extensive familiarity and expertise in the same exact procedure and technique.

## 2024-05-20 ENCOUNTER — Ambulatory Visit: Admitting: Physical Therapy

## 2024-05-20 ENCOUNTER — Ambulatory Visit: Admitting: Pain Medicine

## 2024-05-20 DIAGNOSIS — M51369 Other intervertebral disc degeneration, lumbar region without mention of lumbar back pain or lower extremity pain: Secondary | ICD-10-CM | POA: Insufficient documentation

## 2024-05-20 DIAGNOSIS — Z95828 Presence of other vascular implants and grafts: Secondary | ICD-10-CM

## 2024-05-20 DIAGNOSIS — R937 Abnormal findings on diagnostic imaging of other parts of musculoskeletal system: Secondary | ICD-10-CM

## 2024-05-20 DIAGNOSIS — M5416 Radiculopathy, lumbar region: Secondary | ICD-10-CM | POA: Insufficient documentation

## 2024-05-20 DIAGNOSIS — M47816 Spondylosis without myelopathy or radiculopathy, lumbar region: Secondary | ICD-10-CM

## 2024-05-20 DIAGNOSIS — M5126 Other intervertebral disc displacement, lumbar region: Secondary | ICD-10-CM | POA: Insufficient documentation

## 2024-05-20 DIAGNOSIS — G8929 Other chronic pain: Secondary | ICD-10-CM

## 2024-05-20 DIAGNOSIS — Z7901 Long term (current) use of anticoagulants: Secondary | ICD-10-CM

## 2024-05-20 DIAGNOSIS — Z5189 Encounter for other specified aftercare: Secondary | ICD-10-CM

## 2024-05-20 DIAGNOSIS — M48061 Spinal stenosis, lumbar region without neurogenic claudication: Secondary | ICD-10-CM | POA: Insufficient documentation

## 2024-05-20 DIAGNOSIS — I739 Peripheral vascular disease, unspecified: Secondary | ICD-10-CM

## 2024-05-20 DIAGNOSIS — Z91199 Patient's noncompliance with other medical treatment and regimen due to unspecified reason: Secondary | ICD-10-CM

## 2024-05-20 DIAGNOSIS — M4186 Other forms of scoliosis, lumbar region: Secondary | ICD-10-CM | POA: Insufficient documentation

## 2024-05-20 NOTE — Patient Instructions (Signed)
 ______________________________________________________________________    Post-Procedure Discharge Instructions  INSTRUCTIONS Apply ice:  Purpose: This will minimize any swelling and discomfort after procedure.  When: Day of procedure, as soon as you get home. How: Fill a plastic sandwich bag with crushed ice. Cover it with a small towel and apply to injection site. How long: (15 min on, 15 min off) Apply for 15 minutes then remove x 15 minutes.  Repeat sequence on day of procedure, until you go to bed. Apply heat:  Purpose: To treat any soreness and discomfort from the procedure. When: Starting the next day after the procedure. How: Apply heat to procedure site starting the day following the procedure. How long: May continue to repeat daily, until discomfort goes away. Food intake: Start with clear liquids (like water) and advance to regular food, as tolerated.  Physical activities: Keep activities to a minimum for the first 8 hours after the procedure. After that, then as tolerated. Driving: If you have received any sedation, be responsible and do not drive. You are not allowed to drive for 24 hours after having sedation. Blood thinner: (Applies only to those taking blood thinners) You may restart your blood thinner 6 hours after your procedure. Insulin: (Applies only to Diabetic patients taking insulin) As soon as you can eat, you may resume your normal dosing schedule. Infection prevention: Keep procedure site clean and dry. Shower daily and clean area with soap and water.  PAIN DIARY Post-procedure Pain Diary: Extremely important that this be done correctly and accurately. Recorded information will be used to determine the next step in treatment. For the purpose of accuracy, follow these rules: Evaluate only the area treated. Do not report or include pain from an untreated area. For the purpose of this evaluation, ignore all other areas of pain, except for the treated area. After your  procedure, avoid taking a long nap and attempting to complete the pain diary after you wake up. Instead, set your alarm clock to go off every hour, on the hour, for the initial 8 hours after the procedure. Document the duration of the numbing medicine, and the relief you are getting from it. Do not go to sleep and attempt to complete it later. It will not be accurate. If you received sedation, it is likely that you were given a medication that may cause amnesia. Because of this, completing the diary at a later time may cause the information to be inaccurate. This information is needed to plan your care. Follow-up appointment: Keep your post-procedure follow-up evaluation appointment after the procedure (usually 2 weeks for most procedures, 6 weeks for radiofrequencies). DO NOT FORGET to bring you pain diary with you.   EXPECT... (What should I expect to see with my procedure?) From numbing medicine (AKA: Local Anesthetics): Numbness or decrease in pain. You may also experience some weakness, which if present, could last for the duration of the local anesthetic. Onset: Full effect within 15 minutes of injected. Duration: It will depend on the type of local anesthetic used. On the average, 1 to 8 hours.  From steroids (Applies only if steroids were used): Decrease in swelling or inflammation. Once inflammation is improved, relief of the pain will follow. Onset of benefits: Depends on the amount of swelling present. The more swelling, the longer it will take for the benefits to be seen. In some cases, up to 10 days. Duration: Steroids will stay in the system x 2 weeks. Duration of benefits will depend on multiple posibilities including persistent irritating  factors. Side-effects: If present, they may typically last 2 weeks (the duration of the steroids). Frequent: Cramps (if they occur, drink Gatorade and take over-the-counter Magnesium 450-500 mg once to twice a day); water retention with temporary weight  gain; increases in blood sugar; decreased immune system response; increased appetite. Occasional: Facial flushing (red, warm cheeks); mood swings; menstrual changes. Uncommon: Long-term decrease or suppression of natural hormones; bone thinning. (These are more common with higher doses or more frequent use. This is why we prefer that our patients avoid having any injection therapies in other practices.)  Very Rare: Severe mood changes; psychosis; aseptic necrosis. From procedure: Some discomfort is to be expected once the numbing medicine wears off. This should be minimal if ice and heat are applied as instructed.  CALL IF... (When should I call?) You experience numbness and weakness that gets worse with time, as opposed to wearing off. New onset bowel or bladder incontinence. (Applies only to procedures done in the spine)  Emergency Numbers: Durning business hours (Monday - Thursday, 8:00 AM - 4:00 PM) (Friday, 9:00 AM - 12:00 Noon): [phone] After hours: [phone] NOTE: If you are having a problem and are unable connect with, or to talk to a provider, then go to your nearest urgent care or emergency department. If the problem is serious and urgent, please call 911. ______________________________________________________________________     ______________________________________________________________________    Steroid injections  Common steroids for injections Triamcinolone : Used by many sports medicine physicians for large joint and bursal injections, often combined with a local anesthetic like lidocaine . A study focusing on coccydynia (tailbone pain) found triamcinolone  was more effective than betamethasone, suggesting it may also be preferable for other localized inflammation conditions. Methylprednisolone: A common alternative to triamcinolone  that is also a strong anti-inflammatory. It is available in different formulations, with the acetate suspension being the long-acting  option for intra-articular injections. Dexamethasone : This is a non-particulate steroid, meaning it has a lower risk of tissue damage compared to particulate steroids like triamcinolone  and methylprednisolone. While less common for this specific use, it is an option for targeted injections.   Considerations for physicians Particulate vs. non-particulate steroids: Triamcinolone  and methylprednisolone are particulate, meaning they can clump together. Dexamethasone  is non-particulate. Particulate steroids are often preferred for their longer-lasting effects but carry a theoretical higher risk for certain injections (though this is less of a concern in the costochondral joints). Combined injectate: Corticosteroids are typically mixed with a local anesthetic like lidocaine  to provide both immediate pain relief (from the anesthetic) and longer-term inflammation reduction (from the steroid). Imaging guidance: To ensure accurate placement of the needle and medication, physicians may use ultrasound or fluoroscopic guidance for the injection, especially in complex or refractory cases.   Patient guidance Before undergoing a steroid injection, discuss the options with your physician. They will determine the best steroid, dosage, and procedure for your specific case based on factors like: Severity of your condition History of response to other treatments Your overall health status Experience and preference of the physician  Last  Updated: 02/05/2024 ______________________________________________________________________

## 2024-05-22 ENCOUNTER — Ambulatory Visit: Admitting: Physical Therapy

## 2024-05-27 ENCOUNTER — Ambulatory Visit: Admitting: Physical Therapy

## 2024-05-27 ENCOUNTER — Ambulatory Visit: Admitting: Pain Medicine

## 2024-05-29 ENCOUNTER — Ambulatory Visit: Admitting: Physical Therapy

## 2024-06-03 ENCOUNTER — Ambulatory Visit: Admitting: Physical Therapy

## 2024-06-09 ENCOUNTER — Ambulatory Visit: Admitting: Physical Therapy

## 2024-06-11 ENCOUNTER — Other Ambulatory Visit: Payer: Self-pay | Admitting: Family Medicine

## 2024-06-11 ENCOUNTER — Ambulatory Visit: Admitting: Physical Therapy

## 2024-06-17 ENCOUNTER — Ambulatory Visit: Admitting: Physical Therapy

## 2024-06-19 ENCOUNTER — Ambulatory Visit: Admitting: Physical Therapy

## 2024-06-30 ENCOUNTER — Ambulatory Visit: Payer: Self-pay | Admitting: Student in an Organized Health Care Education/Training Program

## 2024-07-01 ENCOUNTER — Encounter: Payer: Self-pay | Admitting: Student in an Organized Health Care Education/Training Program

## 2024-07-01 ENCOUNTER — Ambulatory Visit
Payer: Self-pay | Attending: Student in an Organized Health Care Education/Training Program | Admitting: Student in an Organized Health Care Education/Training Program

## 2024-07-01 VITALS — BP 189/71 | HR 61 | Temp 97.0°F | Resp 16 | Ht 61.0 in | Wt 132.5 lb

## 2024-07-01 DIAGNOSIS — M5416 Radiculopathy, lumbar region: Secondary | ICD-10-CM | POA: Insufficient documentation

## 2024-07-01 DIAGNOSIS — G894 Chronic pain syndrome: Secondary | ICD-10-CM | POA: Diagnosis not present

## 2024-07-01 DIAGNOSIS — Z9229 Personal history of other drug therapy: Secondary | ICD-10-CM | POA: Insufficient documentation

## 2024-07-01 DIAGNOSIS — G8929 Other chronic pain: Secondary | ICD-10-CM | POA: Insufficient documentation

## 2024-07-01 NOTE — Progress Notes (Signed)
 Safety precautions to be maintained throughout the outpatient stay will include: orient to surroundings, keep bed in low position, maintain call bell within reach at all times, provide assistance with transfer out of bed and ambulation.
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
## 2024-07-01 NOTE — Patient Instructions (Signed)
 Moderate Conscious Sedation, Adult Sedation is the use of medicines to help you relax and not feel pain. Moderate conscious sedation is a type of sedation that makes you less alert than normal. You are still able to respond to instructions, touch, or both. This type of sedation is used during short medical and dental procedures. It is milder than deep sedation, which is a type of sedation you cannot be easily woken up from. It is also milder than general anesthesia, which is the use of medicines to make you fall asleep. Moderate conscious sedation lets you return to your normal activities sooner. Tell a health care provider about: Any allergies you have. All medicines you are taking, including vitamins, herbs, steroids, eye drops, creams, and over-the-counter medicines. Any problems you or family members have had with anesthesia. Any bleeding problems you have. Any surgeries you have had. Any medical conditions you have. Whether you are pregnant or may be pregnant. Any recent alcohol, tobacco, or drug use. What are the risks? Your health care provider will talk with you about risks. These may include: Oversedation. This is when you get too much medicine. Nausea or vomiting. Allergic reaction to medicines. Trouble breathing. If this happens, a breathing tube may be used. It will be removed when you can breathe better on your own. Heart trouble. Lung trouble. Emergence delirium. This is when you feel confused while the sedation wears off. This gets better with time. What happens before the procedure? When to stop eating and drinking Follow instructions from your health care provider about what you may eat and drink. These may include: 8 hours before your procedure Stop eating most foods. Do not eat meat, fried foods, or fatty foods. Eat only light foods, such as toast or crackers. All liquids are okay except energy drinks and alcohol. 6 hours before your procedure Stop eating. Drink only  clear liquids, such as water, clear fruit juice, black coffee, plain tea, and sports drinks. Do not drink energy drinks or alcohol. 2 hours before your procedure Stop drinking all liquids. You may be allowed to take medicines with small sips of water. If you do not follow your health care provider's instructions, your procedure may be delayed or canceled. Medicines Ask your health care provider about: Changing or stopping your regular medicines. These include any diabetes medicines or blood thinners you take. Taking medicines such as aspirin and ibuprofen. These medicines can thin your blood. Do not take them unless your health care provider tells you to. Taking over-the-counter medicines, vitamins, herbs, and supplements. Tests and exams You may have an exam or testing. You may have a blood or urine sample taken. General instructions Do not use any products that contain nicotine or tobacco for at least 4 weeks before the procedure. These products include cigarettes, chewing tobacco, and vaping devices, such as e-cigarettes. If you need help quitting, ask your health care provider. If you will be going home right after the procedure, plan to have a responsible adult: Take you home from the hospital or clinic. You will not be allowed to drive. Care for you for the time you are told. What happens during the procedure?  You will be given the sedative. It may be given: As a pill you can take by mouth. It can also be put into the rectum. As a spray through the nose. As an injection into muscle. As an injection into a vein through an IV. You may be given oxygen as needed. Your blood pressure, heart  rate, breathing rate, and blood oxygen level will be monitored during the procedure. The medical or dental procedure will be done. The procedure may vary among health care providers and hospitals. What happens after the procedure? Your blood pressure, heart rate, breathing rate, and blood oxygen  level will be monitored until you leave the hospital or clinic. You will get fluids through an IV as needed. Do not drive or operate machinery until your health care provider says that it is safe. This information is not intended to replace advice given to you by your health care provider. Make sure you discuss any questions you have with your health care provider. Document Revised: 12/12/2021 Document Reviewed: 12/12/2021 Elsevier Patient Education  2024 [employer]GENERAL RISKS AND COMPLICATIONS  What are the risk, side effects and possible complications? Generally speaking, most procedures are safe.  However, with any procedure there are risks, side effects, and the possibility of complications.  The risks and complications are dependent upon the sites that are lesioned, or the type of nerve block to be performed.  The closer the procedure is to the spine, the more serious the risks are.  Great care is taken when placing the radio frequency needles, block needles or lesioning probes, but sometimes complications can occur. Infection: Any time there is an injection through the skin, there is a risk of infection.  This is why sterile conditions are used for these blocks.  There are four possible types of infection. Localized skin infection. Central Nervous System Infection-This can be in the form of Meningitis, which can be deadly. Epidural Infections-This can be in the form of an epidural abscess, which can cause pressure inside of the spine, causing compression of the spinal cord with subsequent paralysis. This would require an emergency surgery to decompress, and there are no guarantees that the patient would recover from the paralysis. Discitis-This is an infection of the intervertebral discs.  It occurs in about 1% of discography procedures.  It is difficult to treat and it may lead to surgery.        2. Pain: the needles have to go through skin and soft tissues, will cause soreness.        3. Damage to internal structures:  The nerves to be lesioned may be near blood vessels or    other nerves which can be potentially damaged.       4. Bleeding: Bleeding is more common if the patient is taking blood thinners such as  aspirin, Coumadin, Ticiid, Plavix, etc., or if he/she have some genetic predisposition  such as hemophilia. Bleeding into the spinal canal can cause compression of the spinal  cord with subsequent paralysis.  This would require an emergency surgery to  decompress and there are no guarantees that the patient would recover from the  paralysis.       5. Pneumothorax:  Puncturing of a lung is a possibility, every time a needle is introduced in  the area of the chest or upper back.  Pneumothorax refers to free air around the  collapsed lung(s), inside of the thoracic cavity (chest cavity).  Another two possible  complications related to a similar event would include: Hemothorax and Chylothorax.   These are variations of the Pneumothorax, where instead of air around the collapsed  lung(s), you may have blood or chyle, respectively.       6. Spinal headaches: They may occur with any procedures in the area of the spine.       7.  Persistent CSF (Cerebro-Spinal Fluid) leakage: This is a rare problem, but may occur  with prolonged intrathecal or epidural catheters either due to the formation of a fistulous  track or a dural tear.       8. Nerve damage: By working so close to the spinal cord, there is always a possibility of  nerve damage, which could be as serious as a permanent spinal cord injury with  paralysis.       9. Death:  Although rare, severe deadly allergic reactions known as "Anaphylactic  reaction" can occur to any of the medications used.      10. Worsening of the symptoms:  We can always make thing worse.  What are the chances of something like this happening? Chances of any of this occuring are extremely low.  By statistics, you have more of a chance of getting killed in a  motor vehicle accident: while driving to the hospital than any of the above occurring .  Nevertheless, you should be aware that they are possibilities.  In general, it is similar to taking a shower.  Everybody knows that you can slip, hit your head and get killed.  Does that mean that you should not shower again?  Nevertheless always keep in mind that statistics do not mean anything if you happen to be on the wrong side of them.  Even if a procedure has a 1 (one) in a 1,000,000 (million) chance of going wrong, it you happen to be that one..Also, keep in mind that by statistics, you have more of a chance of having something go wrong when taking medications.  Who should not have this procedure? If you are on a blood thinning medication (e.g. Coumadin, Plavix, see list of "Blood Thinners"), or if you have an active infection going on, you should not have the procedure.  If you are taking any blood thinners, please inform your physician.  How should I prepare for this procedure? Do not eat or drink anything at least six hours prior to the procedure. Bring a driver with you .  It cannot be a taxi. Come accompanied by an adult that can drive you back, and that is strong enough to help you if your legs get weak or numb from the local anesthetic. Take all of your medicines the morning of the procedure with just enough water to swallow them. If you have diabetes, make sure that you are scheduled to have your procedure done first thing in the morning, whenever possible. If you have diabetes, take only half of your insulin dose and notify our nurse that you have done so as soon as you arrive at the clinic. If you are diabetic, but only take blood sugar pills (oral hypoglycemic), then do not take them on the morning of your procedure.  You may take them after you have had the procedure. Do not take aspirin or any aspirin-containing medications, at least eleven (11) days prior to the procedure.  They may prolong  bleeding. Wear loose fitting clothing that may be easy to take off and that you would not mind if it got stained with Betadine or blood. Do not wear any jewelry or perfume Remove any nail coloring.  It will interfere with some of our monitoring equipment.  NOTE: Remember that this is not meant to be interpreted as a complete list of all possible complications.  Unforeseen problems may occur.  BLOOD THINNERS The following drugs contain aspirin or other products, which can cause increased  bleeding during surgery and should not be taken for 2 weeks prior to and 1 week after surgery.  If you should need take something for relief of minor pain, you may take acetaminophen which is found in Tylenol,m Datril, Anacin-3 and Panadol. It is not blood thinner. The products listed below are.  Do not take any of the products listed below in addition to any listed on your instruction sheet.  A.P.C or A.P.C with Codeine Codeine Phosphate Capsules #3 Ibuprofen Ridaura  ABC compound Congesprin Imuran rimadil  Advil Cope Indocin Robaxisal  Alka-Seltzer Effervescent Pain Reliever and Antacid Coricidin or Coricidin-D  Indomethacin Rufen  Alka-Seltzer plus Cold Medicine Cosprin Ketoprofen S-A-C Tablets  Anacin Analgesic Tablets or Capsules Coumadin Korlgesic Salflex  Anacin Extra Strength Analgesic tablets or capsules CP-2 Tablets Lanoril Salicylate  Anaprox Cuprimine Capsules Levenox Salocol  Anexsia-D Dalteparin Magan Salsalate  Anodynos Darvon compound Magnesium Salicylate Sine-off  Ansaid Dasin Capsules Magsal Sodium Salicylate  Anturane Depen Capsules Marnal Soma  APF Arthritis pain formula Dewitt's Pills Measurin Stanback  Argesic Dia-Gesic Meclofenamic Sulfinpyrazone  Arthritis Bayer Timed Release Aspirin Diclofenac Meclomen Sulindac  Arthritis pain formula Anacin Dicumarol Medipren Supac  Analgesic (Safety coated) Arthralgen Diffunasal Mefanamic Suprofen  Arthritis Strength Bufferin Dihydrocodeine Mepro  Compound Suprol  Arthropan liquid Dopirydamole Methcarbomol with Aspirin Synalgos  ASA tablets/Enseals Disalcid Micrainin Tagament  Ascriptin Doan's Midol Talwin  Ascriptin A/D Dolene Mobidin Tanderil  Ascriptin Extra Strength Dolobid Moblgesic Ticlid  Ascriptin with Codeine Doloprin or Doloprin with Codeine Momentum Tolectin  Asperbuf Duoprin Mono-gesic Trendar  Aspergum Duradyne Motrin or Motrin IB Triminicin  Aspirin plain, buffered or enteric coated Durasal Myochrisine Trigesic  Aspirin Suppositories Easprin Nalfon Trillsate  Aspirin with Codeine Ecotrin Regular or Extra Strength Naprosyn Uracel  Atromid-S Efficin Naproxen Ursinus  Auranofin Capsules Elmiron Neocylate Vanquish  Axotal Emagrin Norgesic Verin  Azathioprine Empirin or Empirin with Codeine Normiflo Vitamin E  Azolid Emprazil Nuprin Voltaren  Bayer Aspirin plain, buffered or children's or timed BC Tablets or powders Encaprin Orgaran Warfarin Sodium  Buff-a-Comp Enoxaparin Orudis Zorpin  Buff-a-Comp with Codeine Equegesic Os-Cal-Gesic   Buffaprin Excedrin plain, buffered or Extra Strength Oxalid   Bufferin Arthritis Strength Feldene Oxphenbutazone   Bufferin plain or Extra Strength Feldene Capsules Oxycodone with Aspirin   Bufferin with Codeine Fenoprofen Fenoprofen Pabalate or Pabalate-SF   Buffets II Flogesic Panagesic   Buffinol plain or Extra Strength Florinal or Florinal with Codeine Panwarfarin   Buf-Tabs Flurbiprofen Penicillamine   Butalbital Compound Four-way cold tablets Penicillin   Butazolidin Fragmin Pepto-Bismol   Carbenicillin Geminisyn Percodan   Carna Arthritis Reliever Geopen Persantine   Carprofen Gold's salt Persistin   Chloramphenicol Goody's Phenylbutazone   Chloromycetin Haltrain Piroxlcam   Clmetidine heparin Plaquenil   Cllnoril Hyco-pap Ponstel   Clofibrate Hydroxy chloroquine Propoxyphen         Before stopping any of these medications, be sure to consult the physician who ordered them.   Some, such as Coumadin (Warfarin) are ordered to prevent or treat serious conditions such as "deep thrombosis", "pumonary embolisms", and other heart problems.  The amount of time that you may need off of the medication may also vary with the medication and the reason for which you were taking it.  If you are taking any of these medications, please make sure you notify your pain physician before you undergo any procedures.         Epidural Steroid Injection Patient Information  Description: The epidural space surrounds the nerves as they  exit the spinal cord.  In some patients, the nerves can be compressed and inflamed by a bulging disc or a tight spinal canal (spinal stenosis).  By injecting steroids into the epidural space, we can bring irritated nerves into direct contact with a potentially helpful medication.  These steroids act directly on the irritated nerves and can reduce swelling and inflammation which often leads to decreased pain.  Epidural steroids may be injected anywhere along the spine and from the neck to the low back depending upon the location of your pain.   After numbing the skin with local anesthetic (like Novocaine), a small needle is passed into the epidural space slowly.  You may experience a sensation of pressure while this is being done.  The entire block usually last less than 10 minutes.  Conditions which may be treated by epidural steroids:  Low back and leg pain Neck and arm pain Spinal stenosis Post-laminectomy syndrome Herpes zoster (shingles) pain Pain from compression fractures  Preparation for the injection:  Do not eat any solid food or dairy products within 8 hours of your appointment.  You may drink clear liquids up to 3 hours before appointment.  Clear liquids include water, black coffee, juice or soda.  No milk or cream please. You may take your regular medication, including pain medications, with a sip of water before your appointment  Diabetics  should hold regular insulin (if taken separately) and take 1/2 normal NPH dos the morning of the procedure.  Carry some sugar containing items with you to your appointment. A driver must accompany you and be prepared to drive you home after your procedure.  Bring all your current medications with your. An IV may be inserted and sedation may be given at the discretion of the physician.   A blood pressure cuff, EKG and other monitors will often be applied during the procedure.  Some patients may need to have extra oxygen administered for a short period. You will be asked to provide medical information, including your allergies, prior to the procedure.  We must know immediately if you are taking blood thinners (like Coumadin/Warfarin)  Or if you are allergic to IV iodine contrast (dye). We must know if you could possible be pregnant.  Possible side-effects: Bleeding from needle site Infection (rare, may require surgery) Nerve injury (rare) Numbness & tingling (temporary) Difficulty urinating (rare, temporary) Spinal headache ( a headache worse with upright posture) Light -headedness (temporary) Pain at injection site (several days) Decreased blood pressure (temporary) Weakness in arm/leg (temporary) Pressure sensation in back/neck (temporary)  Call if you experience: Fever/chills associated with headache or increased back/neck pain. Headache worsened by an upright position. New onset weakness or numbness of an extremity below the injection site Hives or difficulty breathing (go to the emergency room) Inflammation or drainage at the infection site Severe back/neck pain Any new symptoms which are concerning to you  Please note:  Although the local anesthetic injected can often make your back or neck feel good for several hours after the injection, the pain will likely return.  It takes 3-7 days for steroids to work in the epidural space.  You may not notice any pain relief for at least that  one week.  If effective, we will often do a series of three injections spaced 3-6 weeks apart to maximally decrease your pain.  After the initial series, we generally will wait several months before considering a repeat injection of the same type.  If you have any questions,  please call [phone] [facility]

## 2024-07-03 ENCOUNTER — Other Ambulatory Visit: Payer: Self-pay | Admitting: Physician Assistant

## 2024-07-06 ENCOUNTER — Other Ambulatory Visit (INDEPENDENT_AMBULATORY_CARE_PROVIDER_SITE_OTHER): Payer: Self-pay | Admitting: Nurse Practitioner

## 2024-07-10 ENCOUNTER — Other Ambulatory Visit (INDEPENDENT_AMBULATORY_CARE_PROVIDER_SITE_OTHER): Payer: Self-pay | Admitting: Vascular Surgery

## 2024-07-10 NOTE — Telephone Encounter (Signed)
 Labs on 10/23/23 outside of Normal Range  In accordance with refill protocols, please review and address the following requirements before this medication refill can be authorized:  Labs

## 2024-07-16 ENCOUNTER — Ambulatory Visit
Admission: RE | Admit: 2024-07-16 | Discharge: 2024-07-16 | Disposition: A | Source: Ambulatory Visit | Attending: Student in an Organized Health Care Education/Training Program | Admitting: Student in an Organized Health Care Education/Training Program

## 2024-07-16 ENCOUNTER — Ambulatory Visit: Admitting: Student in an Organized Health Care Education/Training Program

## 2024-07-16 ENCOUNTER — Encounter: Payer: Self-pay | Admitting: Student in an Organized Health Care Education/Training Program

## 2024-07-16 DIAGNOSIS — M5416 Radiculopathy, lumbar region: Secondary | ICD-10-CM

## 2024-07-16 DIAGNOSIS — G8929 Other chronic pain: Secondary | ICD-10-CM

## 2024-07-16 DIAGNOSIS — G894 Chronic pain syndrome: Secondary | ICD-10-CM

## 2024-07-16 MED ORDER — LACTATED RINGERS IV SOLN: Freq: Once | INTRAVENOUS | Status: AC

## 2024-07-16 MED ORDER — DEXAMETHASONE SOD PHOSPHATE PF 10 MG/ML IJ SOLN
20.0000 mg | Freq: Once | INTRAMUSCULAR | Status: AC
  Administered 2024-07-16: 20 mg
  Filled 2024-07-16: qty 2

## 2024-07-16 MED ORDER — LIDOCAINE HCL 2 % IJ SOLN
20.0000 mL | Freq: Once | INTRAMUSCULAR | Status: AC
  Administered 2024-07-16: 400 mg
  Filled 2024-07-16: qty 20

## 2024-07-16 MED ORDER — ROPIVACAINE HCL 2 MG/ML IJ SOLN
2.0000 mL | Freq: Once | INTRAMUSCULAR | Status: AC
  Administered 2024-07-16: 2 mL via EPIDURAL
  Filled 2024-07-16: qty 20

## 2024-07-16 MED ORDER — SODIUM CHLORIDE 0.9% FLUSH
2.0000 mL | Freq: Once | INTRAVENOUS | Status: AC
  Administered 2024-07-16: 2 mL

## 2024-07-16 MED ORDER — MIDAZOLAM HCL (PF) 2 MG/2ML IJ SOLN
0.5000 mg | Freq: Once | INTRAMUSCULAR | Status: AC
  Administered 2024-07-16: 2 mg via INTRAVENOUS
  Filled 2024-07-16: qty 2

## 2024-07-16 MED ORDER — IOHEXOL 180 MG/ML  SOLN
10.0000 mL | Freq: Once | INTRAMUSCULAR | Status: AC
  Administered 2024-07-16: 10 mL via EPIDURAL
  Filled 2024-07-16: qty 20

## 2024-07-16 NOTE — Progress Notes (Signed)
 PROVIDER NOTE: Interpretation of information contained herein should be left to medically-trained personnel. Specific patient instructions are provided elsewhere under Patient Instructions section of medical record. This document was created in part using STT-dictation technology, any transcriptional errors that may result from this process are unintentional.  [name] Type: Established DOB: [birth year] MRN: [id number] PCP: Sharma Coyer, MD  Service: Procedure DOS: 07/16/2024 Setting: Ambulatory Location: Ambulatory outpatient facility Delivery: Face-to-face Provider: Wallie Sherry, MD Specialty: Interventional Pain Management Specialty designation: 09 Location: Outpatient facility Ref. Prov.: Sherry Wallie, MD       Interventional Therapy   Procedure: Lumbar trans-foraminal epidural steroid injection (L-TFESI) #1  Laterality: Right (-RT)  Level: L5 and S1 nerve root(s) Imaging: Fluoroscopy-guided         Anesthesia: Local anesthesia (1-2% Lidocaine ) Anxiolysis: IV Versed  DOS: 07/16/2024  Performed by: Wallie Sherry, MD  Purpose: Diagnostic/Therapeutic Indications: Lumbar radicular pain severe enough to impact quality of life or function. 1. Chronic radicular lumbar pain (right)   2. Lumbar radiculopathy   3. Chronic pain syndrome    NAS-11 Pain score:   Pre-procedure: 8 /10   Post-procedure: 6/10     Position / Prep / Materials:  Position: Prone  Prep solution: ChloraPrep (2% chlorhexidine  gluconate and 70% isopropyl alcohol) Prep Area: Entire Posterior Lumbosacral Area.  From the lower tip of the scapula down to the tailbone and from flank to flank. Materials:  Tray: Block Needle(s):  Type: Spinal  Gauge (G): 22  Length: 3.5-in  Qty: 1     H&P (Pre-op Assessment):  Cynthia Sims is a 74 y.o. (year old), female patient, seen today for interventional treatment. She  has a past surgical history that includes Cesarean section (1983); Dilation and  curettage of uterus (2001); Cesarean section; Cholecystectomy; Colonoscopy with propofol  (N/A, 10/29/2015); Esophagogastroduodenoscopy (egd) with propofol  (N/A, 10/29/2015); Colonoscopy with propofol  (N/A, 11/07/2017); abdominal blockage; Lower Extremity Angiography (Right, 10/24/2022); Lower Extremity Angiography (Left, 10/31/2022); LOWER EXTREMITY INTERVENTION (Right, 10/23/2023); Lower Extremity Angiography (Right, 10/23/2023); Colonoscopy (N/A, 02/20/2024); Esophagogastroduodenoscopy (N/A, 02/20/2024); and maloney dilation (02/20/2024). Cynthia Sims has a current medication list which includes the following prescription(s): acetaminophen , aspirin  ec, carvedilol , cholestyramine , clopidogrel , cyanocobalamin , duloxetine , gabapentin , gvoke hypopen 2-pack, basaglar  kwikpen, isosorbide  mononitrate, lantus  solostar, lovastatin , metformin , nitroglycerin , ondansetron , pantoprazole , semaglutide(0.25 or 0.5mg /dos), and trimethoprim . Her primarily concern today is the Back Pain (lower)  Initial Vital Signs:  Pulse/HCG Rate: 71ECG Heart Rate: 79 Temp: (!) 97.2 F (36.2 C) Resp: 18 BP: (!) 150/81 SpO2: 100 %  BMI: Estimated body mass index is 24.94 kg/m as calculated from the following:   Height as of this encounter: 5' 1 (1.549 m).   Weight as of this encounter: 132 lb (59.9 kg).  Risk Assessment: Allergies: Reviewed. She has no known allergies.  Allergy Precautions: None required Coagulopathies: Reviewed. None identified.  Blood-thinner therapy: None at this time Active Infection(s): Reviewed. None identified. Cynthia Sims is afebrile  Site Confirmation: Cynthia Sims was asked to confirm the procedure and laterality before marking the site Procedure checklist: Completed Consent: Before the procedure and under the influence of no sedative(s), amnesic(s), or anxiolytics, the patient was informed of the treatment options, risks and possible complications. To fulfill our ethical and legal obligations, as recommended  by the American Medical Association's Code of Ethics, I have informed the patient of my clinical impression; the nature and purpose of the treatment or procedure; the risks, benefits, and possible complications of the intervention; the alternatives, including doing nothing; the risk(s) and benefit(s) of the  alternative treatment(s) or procedure(s); and the risk(s) and benefit(s) of doing nothing. The patient was provided information about the general risks and possible complications associated with the procedure. These may include, but are not limited to: failure to achieve desired goals, infection, bleeding, organ or nerve damage, allergic reactions, paralysis, and death. In addition, the patient was informed of those risks and complications associated to Spine-related procedures, such as failure to decrease pain; infection (i.e.: Meningitis, epidural or intraspinal abscess); bleeding (i.e.: epidural hematoma, subarachnoid hemorrhage, or any other type of intraspinal or peri-dural bleeding); organ or nerve damage (i.e.: Any type of peripheral nerve, nerve root, or spinal cord injury) with subsequent damage to sensory, motor, and/or autonomic systems, resulting in permanent pain, numbness, and/or weakness of one or several areas of the body; allergic reactions; (i.e.: anaphylactic reaction); and/or death. Furthermore, the patient was informed of those risks and complications associated with the medications. These include, but are not limited to: allergic reactions (i.e.: anaphylactic or anaphylactoid reaction(s)); adrenal axis suppression; blood sugar elevation that in diabetics may result in ketoacidosis or comma; water retention that in patients with history of congestive heart failure may result in shortness of breath, pulmonary edema, and decompensation with resultant heart failure; weight gain; swelling or edema; medication-induced neural toxicity; particulate matter embolism and blood vessel occlusion with  resultant organ, and/or nervous system infarction; and/or aseptic necrosis of one or more joints. Finally, the patient was informed that Medicine is not an exact science; therefore, there is also the possibility of unforeseen or unpredictable risks and/or possible complications that may result in a catastrophic outcome. The patient indicated having understood very clearly. We have given the patient no guarantees and we have made no promises. Enough time was given to the patient to ask questions, all of which were answered to the patient's satisfaction. Ms. Lomeli has indicated that she wanted to continue with the procedure. Attestation: I, the ordering provider, attest that I have discussed with the patient the benefits, risks, side-effects, alternatives, likelihood of achieving goals, and potential problems during recovery for the procedure that I have provided informed consent. Date  Time: 07/16/2024 11:03 AM  Pre-Procedure Preparation:  Monitoring: As per clinic protocol. Respiration, ETCO2, SpO2, BP, heart rate and rhythm monitor placed and checked for adequate function Safety Precautions: Patient was assessed for positional comfort and pressure points before starting the procedure. Time-out: I initiated and conducted the Time-out before starting the procedure, as per protocol. The patient was asked to participate by confirming the accuracy of the Time Out information. Verification of the correct person, site, and procedure were performed and confirmed by me, the nursing staff, and the patient. Time-out conducted as per Joint Commission's Universal Protocol (UP.01.01.01). Time: 1212 Start Time: 1212 hrs.  Description/Narrative of Procedure:          Target: The 6 o'clock position under the pedicle, on the affected side. Region: Posterolateral Lumbosacral Approach: Posterior Percutaneous Paravertebral approach.  Rationale (medical necessity): procedure needed and proper for the diagnosis  and/or treatment of the patient's medical symptoms and needs. Procedural Technique Safety Precautions: Aspiration looking for blood return was conducted prior to all injections. At no point did we inject any substances, as a needle was being advanced. No attempts were made at seeking any paresthesias. Safe injection practices and needle disposal techniques used. Medications properly checked for expiration dates. SDV (single dose vial) medications used. Description of the Procedure: Protocol guidelines were followed. The patient was placed in position over the procedure table. The  target area was identified and the area prepped in the usual manner. Skin & deeper tissues infiltrated with local anesthetic. Appropriate amount of time allowed to pass for local anesthetics to take effect. The procedure needles were then advanced to the target area. Proper needle placement secured. Negative aspiration confirmed. Solution injected in intermittent fashion, asking for systemic symptoms every 0.5cc of injectate. The needles were then removed and the area cleansed, making sure to leave some of the prepping solution back to take advantage of its long term bactericidal properties.  Vitals:   07/16/24 1210 07/16/24 1215 07/16/24 1220 07/16/24 1226  BP: (!) 160/89 (!) 157/91 (!) 162/90 (!) 147/69  Pulse:      Resp: 18 16 16    Temp:      TempSrc:      SpO2: 99% 100% 100% 100%  Weight:      Height:        Start Time: 1212 hrs. End Time: 1217 hrs.  Imaging Guidance (Spinal):          Type of Imaging Technique: Fluoroscopy Guidance (Spinal) Indication(s): Fluoroscopy guidance for needle placement to enhance accuracy in procedures requiring precise needle localization for targeted delivery of medication in or near specific anatomical locations not easily accessible without such real-time imaging assistance. Exposure Time: Please see nurses notes. Contrast: Before injecting any contrast, we confirmed that the  patient did not have an allergy to iodine , shellfish, or radiological contrast. Once satisfactory needle placement was completed at the desired level, radiological contrast was injected. Contrast injected under live fluoroscopy. No contrast complications. See chart for type and volume of contrast used. Fluoroscopic Guidance: I was personally present during the use of fluoroscopy. Tunnel Vision Technique used to obtain the best possible view of the target area. Parallax error corrected before commencing the procedure. Direction-depth-direction technique used to introduce the needle under continuous pulsed fluoroscopy. Once target was reached, antero-posterior, oblique, and lateral fluoroscopic projection used confirm needle placement in all planes. Images permanently stored in EMR. Interpretation: I personally interpreted the imaging intraoperatively. Adequate needle placement confirmed in multiple planes. Appropriate spread of contrast into desired area was observed. No evidence of afferent or efferent intravascular uptake. No intrathecal or subarachnoid spread observed. Permanent images saved into the patient's record.  Post-operative Assessment:  Post-procedure Vital Signs:  Pulse/HCG Rate: 7172 Temp: (!) 97.2 F (36.2 C) Resp: 16 BP: (!) 147/69 SpO2: 100 %  EBL: None  Complications: No immediate post-treatment complications observed by team, or reported by patient.  Note: The patient tolerated the entire procedure well. A repeat set of vitals were taken after the procedure and the patient was kept under observation following institutional policy, for this type of procedure. Post-procedural neurological assessment was performed, showing return to baseline, prior to discharge. The patient was provided with post-procedure discharge instructions, including a section on how to identify potential problems. Should any problems arise concerning this procedure, the patient was given instructions to  immediately contact us , at any time, without hesitation. In any case, we plan to contact the patient by telephone for a follow-up status report regarding this interventional procedure.  Comments:  No additional relevant information.  Plan of Care (POC)  Orders:  Orders Placed This Encounter  Procedures   DG PAIN CLINIC C-ARM 1-60 MIN NO REPORT    Intraoperative interpretation by procedural physician at [facility].    Standing Status:   Standing    Number of Occurrences:   1    Reason for exam::  Assistance in needle guidance and placement for procedures requiring needle placement in or near specific anatomical locations not easily accessible without such assistance.     Medications ordered for procedure: Meds ordered this encounter  Medications   iohexol  (OMNIPAQUE ) 180 MG/ML injection 10 mL    Must be Myelogram-compatible. If not available, you may substitute with a water-soluble, non-ionic, hypoallergenic, myelogram-compatible radiological contrast medium.   lidocaine  (XYLOCAINE ) 2 % (with pres) injection 400 mg   lactated ringers  infusion   midazolam  PF (VERSED ) injection 0.5-2 mg    Make sure Flumazenil is available in the pyxis when using this medication. If oversedation occurs, administer 0.2 mg IV over 15 sec. If after 45 sec no response, administer 0.2 mg again over 1 min; may repeat at 1 min intervals; not to exceed 4 doses (1 mg)   sodium chloride  flush (NS) 0.9 % injection 2 mL    This is for a two (2) level block. Use two (2) syringes and divide content in half.   ropivacaine  (PF) 2 mg/mL (0.2%) (NAROPIN ) injection 2 mL    This is for a two (2) level block. Use two (2) syringes and divide content in half.   dexamethasone  (DECADRON ) injection 20 mg    This is for a two (2) level block. Use two (2) syringes and divide content in half.   Medications administered: We administered iohexol , lidocaine , lactated ringers , midazolam  PF, sodium chloride  flush,  ropivacaine  (PF) 2 mg/mL (0.2%), and dexamethasone .  See the medical record for exact dosing, route, and time of administration.    Right L5, S1 transforaminal ESI 07/16/2024    Follow-up plan:   Return in about 4 weeks (around 08/13/2024) for PPE, F2F, BL.     Recent Visits Date Type Provider Dept  07/01/24 Office Visit Marcelino Nurse, MD [facility]  05/06/24 Office Visit Patel, Seema K, NP [facility]  Showing recent visits within past 90 days and meeting all other requirements Today's Visits Date Type Provider Dept  07/16/24 Procedure visit Marcelino Nurse, MD [facility]  Showing today's visits and meeting all other requirements Future Appointments Date Type Provider Dept  08/14/24 Appointment Marcelino Nurse, MD [facility]  Showing future appointments within next 90 days and meeting all other requirements   Disposition: Discharge home  Discharge (Date  Time): 07/16/2024; 1231 hrs.   Primary Care Physician: Sharma Coyer, MD Location: [facility] Note by: Nurse Marcelino, MD (TTS technology used. I apologize for any typographical errors that were not detected and corrected.) Date: 07/16/2024; Time: 1:18 PM  Disclaimer:  Medicine is not an visual merchandiser. The only guarantee in medicine is that nothing is guaranteed. It is important to note that the decision to proceed with this intervention was based on the information collected from the patient. The Data and conclusions were drawn from the patient's questionnaire, the interview, and the physical examination. Because the information was provided in large part by the patient, it cannot be guaranteed that it has not been purposely or unconsciously manipulated. Every effort has been made to obtain as much relevant data as possible for this evaluation. It is important to note that the conclusions that lead to this procedure are derived in large part from the available  data. Always take into account that the treatment will also be dependent on availability of resources and existing treatment guidelines, considered by other Pain Management Practitioners as being common knowledge and practice, at the time of the intervention.  For Medico-Legal purposes, it is also important to point out that variation in procedural techniques and pharmacological choices are the acceptable norm. The indications, contraindications, technique, and results of the above procedure should only be interpreted and judged by a Board-Certified Interventional Pain Specialist with extensive familiarity and expertise in the same exact procedure and technique.

## 2024-07-17 ENCOUNTER — Telehealth: Payer: Self-pay

## 2024-07-17 NOTE — Telephone Encounter (Signed)
Post procedure follow up. Patinet states she is doing ok.

## 2024-07-18 ENCOUNTER — Ambulatory Visit: Admitting: Podiatry

## 2024-07-21 ENCOUNTER — Ambulatory Visit (INDEPENDENT_AMBULATORY_CARE_PROVIDER_SITE_OTHER): Admitting: Vascular Surgery

## 2024-07-21 ENCOUNTER — Encounter (INDEPENDENT_AMBULATORY_CARE_PROVIDER_SITE_OTHER)

## 2024-07-22 ENCOUNTER — Ambulatory Visit: Admitting: Physician Assistant

## 2024-07-28 ENCOUNTER — Ambulatory Visit (INDEPENDENT_AMBULATORY_CARE_PROVIDER_SITE_OTHER): Admitting: Vascular Surgery

## 2024-08-14 ENCOUNTER — Ambulatory Visit: Admitting: Student in an Organized Health Care Education/Training Program

## 2024-09-01 ENCOUNTER — Ambulatory Visit: Admitting: Urology

## 2024-09-02 ENCOUNTER — Ambulatory Visit: Admitting: Family Medicine

## 2024-09-05 ENCOUNTER — Ambulatory Visit: Admitting: Podiatry
